# Patient Record
Sex: Male | Born: 1965 | Hispanic: Yes | Marital: Married | Smoking: Former smoker
Health system: Southern US, Community
[De-identification: ages and names within clinical notes are randomized; demographics above are authoritative.]

## PROBLEM LIST (undated history)

## (undated) DIAGNOSIS — G4730 Sleep apnea, unspecified: Secondary | ICD-10-CM

## (undated) DIAGNOSIS — S63104A Unspecified dislocation of right thumb, initial encounter: Secondary | ICD-10-CM

## (undated) DIAGNOSIS — I10 Essential (primary) hypertension: Secondary | ICD-10-CM

## (undated) DIAGNOSIS — R519 Headache, unspecified: Secondary | ICD-10-CM

## (undated) DIAGNOSIS — E1165 Type 2 diabetes mellitus with hyperglycemia: Secondary | ICD-10-CM

## (undated) DIAGNOSIS — F332 Major depressive disorder, recurrent severe without psychotic features: Secondary | ICD-10-CM

## (undated) DIAGNOSIS — F1994 Other psychoactive substance use, unspecified with psychoactive substance-induced mood disorder: Secondary | ICD-10-CM

## (undated) DIAGNOSIS — Z9710 Presence of artificial limb (complete) (partial), unspecified: Secondary | ICD-10-CM

## (undated) DIAGNOSIS — K3184 Gastroparesis: Secondary | ICD-10-CM

## (undated) DIAGNOSIS — K5792 Diverticulitis of intestine, part unspecified, without perforation or abscess without bleeding: Secondary | ICD-10-CM

## (undated) DIAGNOSIS — E118 Type 2 diabetes mellitus with unspecified complications: Secondary | ICD-10-CM

## (undated) DIAGNOSIS — F419 Anxiety disorder, unspecified: Secondary | ICD-10-CM

## (undated) DIAGNOSIS — F32A Depression, unspecified: Secondary | ICD-10-CM

## (undated) DIAGNOSIS — E119 Type 2 diabetes mellitus without complications: Secondary | ICD-10-CM

## (undated) DIAGNOSIS — F339 Major depressive disorder, recurrent, unspecified: Secondary | ICD-10-CM

## (undated) DIAGNOSIS — M542 Cervicalgia: Secondary | ICD-10-CM

## (undated) DIAGNOSIS — M25512 Pain in left shoulder: Secondary | ICD-10-CM

## (undated) DIAGNOSIS — F4310 Post-traumatic stress disorder, unspecified: Secondary | ICD-10-CM

## (undated) DIAGNOSIS — F329 Major depressive disorder, single episode, unspecified: Secondary | ICD-10-CM

## (undated) DIAGNOSIS — M1711 Unilateral primary osteoarthritis, right knee: Secondary | ICD-10-CM

## (undated) DIAGNOSIS — M19012 Primary osteoarthritis, left shoulder: Secondary | ICD-10-CM

## (undated) DIAGNOSIS — G8929 Other chronic pain: Secondary | ICD-10-CM

## (undated) DIAGNOSIS — K219 Gastro-esophageal reflux disease without esophagitis: Secondary | ICD-10-CM

## (undated) DIAGNOSIS — R42 Dizziness and giddiness: Secondary | ICD-10-CM

## (undated) DIAGNOSIS — R45851 Suicidal ideations: Secondary | ICD-10-CM

## (undated) HISTORY — PX: LEG AMPUTATION BELOW KNEE: SHX694

## (undated) HISTORY — DX: Type 2 diabetes mellitus with unspecified complications: E11.8

## (undated) HISTORY — DX: Type 2 diabetes mellitus with hyperglycemia: E11.65

## (undated) HISTORY — DX: Major depressive disorder, recurrent, unspecified: F33.9

## (undated) HISTORY — DX: Primary osteoarthritis, left shoulder: M19.012

## (undated) HISTORY — DX: Post-traumatic stress disorder, unspecified: F43.10

## (undated) HISTORY — PX: BACK SURGERY: SHX140

## (undated) HISTORY — DX: Major depressive disorder, recurrent severe without psychotic features: F33.2

## (undated) HISTORY — DX: Pain in left shoulder: M25.512

## (undated) HISTORY — DX: Suicidal ideations: R45.851

## (undated) HISTORY — DX: Other chronic pain: G89.29

## (undated) HISTORY — DX: Unilateral primary osteoarthritis, right knee: M17.11

## (undated) HISTORY — DX: Gastroparesis: K31.84

## (undated) HISTORY — DX: Cervicalgia: M54.2

## (undated) HISTORY — DX: Essential (primary) hypertension: I10

## (undated) HISTORY — PX: SHOULDER SURGERY: SHX246

## (undated) HISTORY — DX: Unspecified dislocation of right thumb, initial encounter: S63.104A

## (undated) HISTORY — DX: Other psychoactive substance use, unspecified with psychoactive substance-induced mood disorder: F19.94

---

## 2016-01-02 ENCOUNTER — Emergency Department: Payer: Medicare Other

## 2016-01-02 ENCOUNTER — Encounter: Payer: Self-pay | Admitting: Emergency Medicine

## 2016-01-02 ENCOUNTER — Emergency Department
Admission: EM | Admit: 2016-01-02 | Discharge: 2016-01-02 | Disposition: A | Discharge status: Home / Self Care | Payer: Medicare Other | Attending: Emergency Medicine | Admitting: Emergency Medicine

## 2016-01-02 DIAGNOSIS — R1011 Right upper quadrant pain: Secondary | ICD-10-CM | POA: Insufficient documentation

## 2016-01-02 DIAGNOSIS — Z89512 Acquired absence of left leg below knee: Secondary | ICD-10-CM | POA: Insufficient documentation

## 2016-01-02 DIAGNOSIS — R1012 Left upper quadrant pain: Secondary | ICD-10-CM | POA: Insufficient documentation

## 2016-01-02 LAB — COMPREHENSIVE METABOLIC PANEL
ALK PHOS: 47 U/L (ref 38–126)
ALT: 30 U/L (ref 17–63)
ANION GAP: 8 (ref 5–15)
AST: 25 U/L (ref 15–41)
Albumin: 4.9 g/dL (ref 3.5–5.0)
BILIRUBIN TOTAL: 0.8 mg/dL (ref 0.3–1.2)
BUN: 17 mg/dL (ref 6–20)
CALCIUM: 9 mg/dL (ref 8.9–10.3)
CO2: 23 mmol/L (ref 22–32)
Chloride: 108 mmol/L (ref 101–111)
Creatinine, Ser: 1.06 mg/dL (ref 0.61–1.24)
GFR calc Af Amer: >60 mL/min (ref >60)
Glucose, Bld: 107 mg/dL — ABNORMAL HIGH (ref 65–99)
POTASSIUM: 3.8 mmol/L (ref 3.5–5.1)
Sodium: 139 mmol/L (ref 135–145)
TOTAL PROTEIN: 7.4 g/dL (ref 6.5–8.1)

## 2016-01-02 LAB — CBC WITH DIFFERENTIAL/PLATELET
BASOS ABS: 0.1 10*3/uL (ref 0–0.1)
BASOS PCT: 1 %
Eosinophils Absolute: 0.2 10*3/uL (ref 0–0.7)
Eosinophils Relative: 3 %
HCT: 44.1 % (ref 40.0–52.0)
Hemoglobin: 15.3 g/dL (ref 13.0–18.0)
LYMPHS PCT: 30 %
Lymphs Abs: 2.2 10*3/uL (ref 1.0–3.6)
MCH: 28.2 pg (ref 26.0–34.0)
MCHC: 34.7 g/dL (ref 32.0–36.0)
MCV: 81.4 fL (ref 80.0–100.0)
MONO ABS: 0.6 10*3/uL (ref 0.2–1.0)
Monocytes Relative: 8 %
Neutro Abs: 4.2 10*3/uL (ref 1.4–6.5)
Neutrophils Relative %: 58 %
PLATELETS: 234 10*3/uL (ref 150–440)
RBC: 5.42 MIL/uL (ref 4.40–5.90)
RDW: 14.1 % (ref 11.5–14.5)
WBC: 7.2 10*3/uL (ref 3.8–10.6)

## 2016-01-02 LAB — LIPASE, BLOOD: Lipase: 32 U/L (ref 11–51)

## 2016-01-02 MED ORDER — METOCLOPRAMIDE HCL 10 MG PO TABS: 10.0000 mg | ORAL_TABLET | Freq: Three times a day (TID) | ORAL | Status: DC

## 2016-01-02 MED ORDER — FAMOTIDINE 20 MG PO TABS
40.0000 mg | ORAL_TABLET | Freq: Once | ORAL | Status: AC
  Administered 2016-01-02: 40 mg via ORAL

## 2016-01-02 MED ORDER — GI COCKTAIL ~~LOC~~
30.0000 mL | ORAL | Status: AC
  Administered 2016-01-02: 30 mL via ORAL
  Filled 2016-01-02: qty 30

## 2016-01-02 MED ORDER — GI COCKTAIL ~~LOC~~
ORAL | Status: AC
  Administered 2016-01-02: 30 mL via ORAL
  Filled 2016-01-02: qty 30

## 2016-01-02 MED ORDER — RANITIDINE HCL 150 MG PO CAPS: 150.0000 mg | ORAL_CAPSULE | Freq: Two times a day (BID) | ORAL | Status: DC

## 2016-01-02 MED ORDER — SUCRALFATE 1 G PO TABS: 1.0000 g | ORAL_TABLET | Freq: Four times a day (QID) | ORAL | Status: DC

## 2016-01-02 MED ORDER — FAMOTIDINE 20 MG PO TABS
ORAL_TABLET | ORAL | Status: AC
  Administered 2016-01-02: 40 mg via ORAL
  Filled 2016-01-02: qty 2

## 2016-01-02 NOTE — Discharge Instructions (Signed)

## 2016-01-02 NOTE — ED Provider Notes (Signed)
[facility] Emergency Department Provider Note  ____________________________________________  Time seen: 5:00 PM  I have reviewed the triage vital signs and the nursing notes.   HISTORY  Chief Complaint Abdominal Pain    HPI Todd [name] is a 50 y.o. male reports nausea vomiting and bilateral upper abdominal pain after eating for the past month. He reports he has been losing a little bit of weight due to inability to eat regularly. He thinks he may have a hiatal hernia as diagnosed in [place]. He was in a [place] in [place] recently for the same without any specific diagnosis. No hematemesis, no change in bowel habits, no melena or what he stool. No chest pain or shortness of breath. No exertional symptoms. Upper abdominal pain is nonradiating and he has no back pain. No headache dizziness or syncope.  He just moved to this area and has scheduled a new patient appointment with primary care.   History reviewed. No pertinent past medical history.   There are no active problems to display for this patient.    Past Surgical History  Procedure Laterality Date  . Leg amputation below knee      left     Current Outpatient Rx  Name  Route  Sig  Dispense  Refill  . metoCLOPramide (REGLAN) 10 MG tablet   Oral   Take 1 tablet (10 mg total) by mouth 4 (four) times daily -  before meals and at bedtime.   60 tablet   0   . ranitidine (ZANTAC) 150 MG capsule   Oral   Take 1 capsule (150 mg total) by mouth 2 (two) times daily.   28 capsule   0   . sucralfate (CARAFATE) 1 g tablet   Oral   Take 1 tablet (1 g total) by mouth 4 (four) times daily.   120 tablet   1      Allergies Review of patient's allergies indicates no known allergies.   History reviewed. No pertinent family history.  Social History Social History  Substance Use Topics  . Smoking status: Never Smoker   . Smokeless tobacco: None  . Alcohol Use: None    Review of  Systems  Constitutional:   No fever or chills. Slight weight loss over the past month.  Eyes:   No vision changes.  ENT:   No sore throat. No rhinorrhea. Cardiovascular:   No chest pain. Respiratory:   No dyspnea or cough. Gastrointestinal:   Abdominal pain with vomiting. No diarrhea or constipation..  No BRBPR or melena. Genitourinary:   Negative for dysuria or difficulty urinating. Musculoskeletal:   Negative for focal pain or swelling Skin:   Negative for rash. Neurological:   Negative for headaches, focal weakness or numbness.  10-point ROS otherwise negative.  ____________________________________________   PHYSICAL EXAM:  VITAL SIGNS: ED Triage Vitals  Enc Vitals Group     BP 01/02/16 1439 154/77 mmHg     Pulse Rate 01/02/16 1439 76     Resp 01/02/16 1439 18     Temp 01/02/16 1439 98.2 F (36.8 C)     Temp Source 01/02/16 1439 Oral     SpO2 01/02/16 1439 97 %     Weight 01/02/16 1439 260 lb (117.935 kg)     Height 01/02/16 1439 5\' 8"  (1.727 m)     Head Cir --      Peak Flow --      Pain Score 01/02/16 1439 7  Pain Loc --      Pain Edu? --      Excl. in GC? --     Vital signs reviewed, nursing assessments reviewed.   Constitutional:   Alert and oriented. Well appearing and in no distress. Eyes:   No scleral icterus. No conjunctival pallor. PERRL. EOMI ENT   Head:   Normocephalic and atraumatic.   Nose:   No congestion/rhinnorhea. No septal hematoma   Mouth/Throat:   MMMPositive pharyngeal erythema. No peritonsillar mass.    Neck:   No stridor. No SubQ emphysema. No meningismus. Hematological/Lymphatic/Immunilogical:   No cervical lymphadenopathy. Cardiovascular:   RRR. Symmetric bilateral radial and DP pulses.  No murmurs.  Respiratory:   Normal respiratory effort without tachypnea nor retractions. Breath sounds are clear and equal bilaterally. No wheezes/rales/rhonchi. Gastrointestinal:    Soft with mild tenderness in the diffuse bilateral  upper abdomen. Not  distended. There is no CVA tenderness.  No rebound, rigidity, or guarding. Genitourinary:   deferred Musculoskeletal:   Nontender with normal range of motion in all extremities. Status post left AKA Neurologic:   Normal speech and language.  CN 2-10 normal. Motor grossly intact. No gross focal neurologic deficits are appreciated.  Skin:    Skin is warm, dry and intact. No rash noted.  No petechiae, purpura, or bullae. Psychiatric:   Mood and affect are normal. ____________________________________________    LABS (pertinent positives/negatives) (all labs ordered are listed, but only abnormal results are displayed) Labs Reviewed  COMPREHENSIVE METABOLIC PANEL - Abnormal; Notable for the following:    Glucose, Bld 107 (*)    All other components within normal limits  CBC WITH DIFFERENTIAL/PLATELET  LIPASE, BLOOD   ____________________________________________   EKGInterpreted by me  Date: 01/02/2016  Rate: 71  Rhythm: normal sinus rhythm  QRS Axis: normal  Intervals: normal  ST/T Wave abnormalities: normal  Conduction Disutrbances: none  Narrative Interpretation: unremarkable      ____________________________________________    RAD Ultrasound right upper quadrant unremarkable Chest x-ray unremarkable  ____________________________________________   PROCEDURES   ____________________________________________   INITIAL IMPRESSION / ASSESSMENT AND PLAN / ED COURSE  Pertinent labs & imaging results that were available during my care of the patient were reviewed by me and considered in my medical decision making (see chart for details).  Patient well appearing no acute distress. Possible biliary colic. We'll get ultrasound. Go ahead and give GI cocktail and H2 blocker.  ----------------------------------------- 7:28 PM on 01/02/2016 -----------------------------------------  Workup negative. Patient feeling better after emesis. He does take  omeprazole at home as well on a daily basis and encouraged him to continue taking this as well as add on an H2 blocker for now. Follow up with primary care and GI. Vital signs and labs are all unremarkable. Imaging is unremarkable.Considering the patient's symptoms, medical history, and physical examination today, I have low suspicion for ACS, PE, TAD, pneumothorax, carditis, mediastinitis, pneumonia, CHF, or sepsis. I have low suspicion for cholecystitis or biliary pathology, pancreatitis, perforation or bowel obstruction, hernia, intra-abdominal abscess, AAA or dissection, volvulus or intussusception, mesenteric ischemia, or appendicitis.       ____________________________________________   FINAL CLINICAL IMPRESSION(S) / ED DIAGNOSES  Final diagnoses:  Bilateral upper abdominal pain      Sharman Cheek, MD 01/02/16 1929

## 2016-01-02 NOTE — ED Notes (Signed)
Reports abd pain n/v and bloating after eating x 1 month.

## 2016-01-11 ENCOUNTER — Encounter: Payer: Self-pay | Admitting: Emergency Medicine

## 2016-01-11 ENCOUNTER — Emergency Department
Admission: EM | Admit: 2016-01-11 | Discharge: 2016-01-11 | Disposition: A | Discharge status: Home / Self Care | Payer: Medicare Other | Attending: Student | Admitting: Student

## 2016-01-11 DIAGNOSIS — I10 Essential (primary) hypertension: Secondary | ICD-10-CM | POA: Diagnosis not present

## 2016-01-11 DIAGNOSIS — Z79899 Other long term (current) drug therapy: Secondary | ICD-10-CM | POA: Diagnosis not present

## 2016-01-11 DIAGNOSIS — L259 Unspecified contact dermatitis, unspecified cause: Secondary | ICD-10-CM

## 2016-01-11 DIAGNOSIS — E119 Type 2 diabetes mellitus without complications: Secondary | ICD-10-CM | POA: Insufficient documentation

## 2016-01-11 DIAGNOSIS — L237 Allergic contact dermatitis due to plants, except food: Secondary | ICD-10-CM | POA: Diagnosis not present

## 2016-01-11 DIAGNOSIS — R21 Rash and other nonspecific skin eruption: Secondary | ICD-10-CM | POA: Diagnosis present

## 2016-01-11 HISTORY — DX: Gastro-esophageal reflux disease without esophagitis: K21.9

## 2016-01-11 HISTORY — DX: Essential (primary) hypertension: I10

## 2016-01-11 HISTORY — DX: Type 2 diabetes mellitus without complications: E11.9

## 2016-01-11 HISTORY — DX: Depression, unspecified: F32.A

## 2016-01-11 HISTORY — DX: Anxiety disorder, unspecified: F41.9

## 2016-01-11 HISTORY — DX: Sleep apnea, unspecified: G47.30

## 2016-01-11 HISTORY — DX: Major depressive disorder, single episode, unspecified: F32.9

## 2016-01-11 MED ORDER — DEXAMETHASONE SODIUM PHOSPHATE 10 MG/ML IJ SOLN
10.0000 mg | Freq: Once | INTRAMUSCULAR | Status: AC
Start: 2016-01-11 — End: 2016-01-11
  Administered 2016-01-11: 10 mg via INTRAMUSCULAR
  Filled 2016-01-11: qty 1

## 2016-01-11 MED ORDER — PREDNISONE 10 MG PO TABS: ORAL_TABLET | ORAL | Status: DC

## 2016-01-11 NOTE — ED Notes (Signed)
Discussed discharge instructions, prescriptions, and follow-up care with patient. No questions or concerns at this time. Pt stable at discharge.  

## 2016-01-11 NOTE — ED Notes (Signed)
Pt c/o rash and itching that he states is from poison ivy or poison oak. States he has been working outside recently and has had this before. Scattered patchy rash noted to hands, lower abdomen and buttocks. Pt has been using calamine lotion without relief.

## 2016-01-11 NOTE — Discharge Instructions (Signed)
Contact Dermatitis Dermatitis is redness, soreness, and swelling (inflammation) of the skin. Contact dermatitis is a reaction to certain substances that touch the skin. You either touched something that irritated your skin, or you have allergies to something you touched.  HOME CARE  Skin Care  Moisturize your skin as needed.  Apply cool compresses to the affected areas.   Try taking a bath with:   Epsom salts. Follow the instructions on the package. You can get these at a pharmacy or grocery store.   Baking soda. Pour a small amount into the bath as told by your doctor.   Colloidal oatmeal. Follow the instructions on the package. You can get this at a pharmacy or grocery store.   Try applying baking soda paste to your skin. Stir water into baking soda until it looks like paste.  Do not scratch your skin.   Bathe less often.  Bathe in lukewarm water. Avoid using hot water.  Medicines  Take or apply over-the-counter and prescription medicines only as told by your doctor.   If you were prescribed an antibiotic medicine, take or apply your antibiotic as told by your doctor. Do not stop taking the antibiotic even if your condition starts to get better. General Instructions  Keep all follow-up visits as told by your doctor. This is important.   Avoid the substance that caused your reaction. If you do not know what caused it, keep a journal to try to track what caused it. Write down:   What you eat.   What cosmetic products you use.   What you drink.   What you wear in the affected area. This includes jewelry.   If you were given a bandage (dressing), take care of it as told by your doctor. This includes when to change and remove it.  GET HELP IF:   You do not get better with treatment.   Your condition gets worse.   You have signs of infection such as:  Swelling.  Tenderness.  Redness.  Soreness.  Warmth.   You have a fever.   You have new  symptoms.  GET HELP RIGHT AWAY IF:   You have a very bad headache.  You have neck pain.  Your neck is stiff.   You throw up (vomit).   You feel very sleepy.   You see red streaks coming from the affected area.   Your bone or joint underneath the affected area becomes painful after the skin has healed.   The affected area turns darker.   You have trouble breathing.    This information is not intended to replace advice given to you by your health care provider. Make sure you discuss any questions you have with your health care provider.   Document Released: 07/26/2009 Document Revised: 06/19/2015 Document Reviewed: 02/13/2015 Elsevier Interactive Patient Education 2016 Else[employer] Inc[employer]   Continue using calamine lotion as needed. Follow-up with your primary care doctor or [facility] if any continued problems. Begin taking prednisone today as directed.

## 2016-01-11 NOTE — ED Provider Notes (Signed)
[facility] Emergency Department Provider Note  ____________________________________________  Time seen: Approximately 10:48 AM  I have reviewed the triage vital signs and the nursing notes.   HISTORY  Chief Complaint Rash   HPI Todd [name] is a 50 y.o. male is here with complaint of poison oak or poison ivy. 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 He states that he has had a lot of problems with Prozac in the past but did not see anything that contained leads that reminded him of poison oak. He now complains of rash to bilateral hands lower abdomen and bilateral buttocks with the left being worse than the right. He has been using calamine lotion without any relief.   Past Medical History  Diagnosis Date  . Hypertension   . GERD (gastroesophageal reflux disease)   . Anxiety   . Depression   . Diabetes mellitus without complication (HCC)   . Sleep apnea     There are no active problems to display for this patient.   Past Surgical History  Procedure Laterality Date  . Leg amputation below knee      left  . Shoulder surgery      Current Outpatient Rx  Name  Route  Sig  Dispense  Refill  . metoCLOPramide (REGLAN) 10 MG tablet   Oral   Take 1 tablet (10 mg total) by mouth 4 (four) times daily -  before meals and at bedtime.   60 tablet   0   . predniSONE (DELTASONE) 10 MG tablet      Take 6 tablets  today, on day 2 take 5 tablets, day 3 take 4 tablets, day 4 take 3 tablets, day 5 take  2 tablets and 1 tablet the last day   21 tablet   0   . ranitidine (ZANTAC) 150 MG capsule   Oral   Take 1 capsule (150 mg total) by mouth 2 (two) times daily.   28 capsule   0   . sucralfate (CARAFATE) 1 g tablet   Oral   Take 1 tablet (1 g total) by mouth 4 (four) times daily.   120 tablet   1     Allergies Benadryl  No family history on file.  Social History Social History  Substance Use Topics  . Smoking  status: Never Smoker   . Smokeless tobacco: Not on file  . Alcohol Use: No    Review of Systems Constitutional: No fever/chills Respiratory: Denies shortness of breath. Gastrointestinal:  No nausea, no vomiting.  Musculoskeletal: Negative for back pain. Skin: Positive for rash.  10-point ROS otherwise negative.  ____________________________________________   PHYSICAL EXAM:  VITAL SIGNS: ED Triage Vitals  Enc Vitals Group     BP 01/11/16 1046 147/97 mmHg     Pulse Rate 01/11/16 1046 74     Resp 01/11/16 1046 17     Temp 01/11/16 1046 98.4 F (36.9 C)     Temp Source 01/11/16 1046 Oral     SpO2 01/11/16 1046 99 %     Weight 01/11/16 1046 260 lb (117.935 kg)     Height 01/11/16 1046  (1.727 m)     Head Cir --      Peak Flow --      Pain Score 01/11/16 1047 0     Pain Loc --      Pain Edu? --      Excl. in GC? --     Constitutional: Alert and  oriented. Well appearing and in no acute distress. Eyes: Conjunctivae are normal. PERRL. EOMI. Head: Atraumatic. Nose: No congestion/rhinnorhea. Neck: No stridor.   Cardiovascular: Normal rate, regular rhythm. Grossly normal heart sounds.  Good peripheral circulation. Respiratory: Normal respiratory effort.  No retractions. Lungs CTAB. Musculoskeletal: Moves upper and lower extremities without any difficulty. Patient does have a prosthesis on his left lower leg. Neurologic:  Normal speech and language. No gross focal neurologic deficits are appreciated. No gait instability. Skin:  Skin is warm, dry. There is minimal involvement of rash to the hands however patient does have moderate amount of erythematous macular papular areas on the left buttocks, right buttocks and around the waistline. Area is covered with calamine but no vesicles were seen. Psychiatric: Mood and affect are normal. Speech and behavior are normal.  ____________________________________________   LABS (all labs ordered are listed, but only abnormal results  are displayed)  Labs Reviewed - No data to display  PROCEDURES  Procedure(s) performed: None  Critical Care performed: No  ____________________________________________   INITIAL IMPRESSION / ASSESSMENT AND PLAN / ED COURSE  Pertinent labs & imaging results that were available during my care of the patient were reviewed by me and considered in my medical decision making (see chart for details).  Patient is allergic to Benadryl and cannot take this for itching. He will continue calamine lotion to the areas. He was given an injection of Decadron IM and prescribed a prednisone 60 mg 6 day taper. ____________________________________________   FINAL CLINICAL IMPRESSION(S) / ED DIAGNOSES  Final diagnoses:  Contact dermatitis      Tommi RumpsRhonda L Delle Andrzejewski, PA-C 01/11/16 1354  Gayla DossEryka A Gayle, MD 01/11/16 1555

## 2016-01-31 DIAGNOSIS — M1711 Unilateral primary osteoarthritis, right knee: Secondary | ICD-10-CM

## 2016-01-31 HISTORY — DX: Unilateral primary osteoarthritis, right knee: M17.11

## 2016-02-15 DIAGNOSIS — M542 Cervicalgia: Secondary | ICD-10-CM

## 2016-02-15 DIAGNOSIS — M25512 Pain in left shoulder: Secondary | ICD-10-CM

## 2016-02-15 DIAGNOSIS — G8929 Other chronic pain: Secondary | ICD-10-CM

## 2016-02-15 HISTORY — DX: Other chronic pain: G89.29

## 2016-02-15 HISTORY — DX: Cervicalgia: M54.2

## 2016-02-28 ENCOUNTER — Emergency Department: Payer: Medicare Other

## 2016-02-28 ENCOUNTER — Emergency Department
Admission: EM | Admit: 2016-02-28 | Discharge: 2016-02-28 | Disposition: A | Discharge status: Home / Self Care | Payer: Medicare Other | Attending: Emergency Medicine | Admitting: Emergency Medicine

## 2016-02-28 ENCOUNTER — Ambulatory Visit: Payer: Medicare Other | Admitting: Psychiatry

## 2016-02-28 ENCOUNTER — Encounter: Payer: Self-pay | Admitting: Emergency Medicine

## 2016-02-28 DIAGNOSIS — I10 Essential (primary) hypertension: Secondary | ICD-10-CM | POA: Insufficient documentation

## 2016-02-28 DIAGNOSIS — Z89512 Acquired absence of left leg below knee: Secondary | ICD-10-CM | POA: Diagnosis not present

## 2016-02-28 DIAGNOSIS — E119 Type 2 diabetes mellitus without complications: Secondary | ICD-10-CM | POA: Diagnosis not present

## 2016-02-28 DIAGNOSIS — R600 Localized edema: Secondary | ICD-10-CM | POA: Diagnosis not present

## 2016-02-28 DIAGNOSIS — F329 Major depressive disorder, single episode, unspecified: Secondary | ICD-10-CM | POA: Insufficient documentation

## 2016-02-28 DIAGNOSIS — R609 Edema, unspecified: Secondary | ICD-10-CM

## 2016-02-28 DIAGNOSIS — H5711 Ocular pain, right eye: Secondary | ICD-10-CM | POA: Insufficient documentation

## 2016-02-28 DIAGNOSIS — R51 Headache: Secondary | ICD-10-CM | POA: Diagnosis not present

## 2016-02-28 DIAGNOSIS — M79604 Pain in right leg: Secondary | ICD-10-CM | POA: Diagnosis present

## 2016-02-28 DIAGNOSIS — R519 Headache, unspecified: Secondary | ICD-10-CM

## 2016-02-28 LAB — CBC
HCT: 45.2 % (ref 40.0–52.0)
Hemoglobin: 15.3 g/dL (ref 13.0–18.0)
MCH: 28 pg (ref 26.0–34.0)
MCHC: 33.8 g/dL (ref 32.0–36.0)
MCV: 82.7 fL (ref 80.0–100.0)
PLATELETS: 194 10*3/uL (ref 150–440)
RBC: 5.46 MIL/uL (ref 4.40–5.90)
RDW: 13.8 % (ref 11.5–14.5)
WBC: 5.8 10*3/uL (ref 3.8–10.6)

## 2016-02-28 LAB — COMPREHENSIVE METABOLIC PANEL
ALT: 34 U/L (ref 17–63)
ANION GAP: 6 (ref 5–15)
AST: 26 U/L (ref 15–41)
Albumin: 4.6 g/dL (ref 3.5–5.0)
Alkaline Phosphatase: 45 U/L (ref 38–126)
BUN: 15 mg/dL (ref 6–20)
CALCIUM: 9.2 mg/dL (ref 8.9–10.3)
CO2: 26 mmol/L (ref 22–32)
Chloride: 109 mmol/L (ref 101–111)
Creatinine, Ser: 0.96 mg/dL (ref 0.61–1.24)
GFR calc Af Amer: >60 mL/min (ref >60)
GLUCOSE: 92 mg/dL (ref 65–99)
Potassium: 4.1 mmol/L (ref 3.5–5.1)
SODIUM: 141 mmol/L (ref 135–145)
TOTAL PROTEIN: 7.2 g/dL (ref 6.5–8.1)
Total Bilirubin: 0.9 mg/dL (ref 0.3–1.2)

## 2016-02-28 MED ORDER — BUTALBITAL-APAP-CAFFEINE 50-325-40 MG PO TABS: 1.0000 | ORAL_TABLET | Freq: Four times a day (QID) | ORAL | Status: AC | PRN

## 2016-02-28 MED ORDER — TETRACAINE HCL 0.5 % OP SOLN
2.0000 [drp] | Freq: Once | OPHTHALMIC | Status: AC
  Administered 2016-02-28: 2 [drp] via OPHTHALMIC
  Filled 2016-02-28: qty 2

## 2016-02-28 NOTE — Discharge Instructions (Signed)
Peripheral Edema You have swelling in your legs (peripheral edema). This swelling is due to excess accumulation of salt and water in your body. Edema may be a sign of heart, kidney or liver disease, or a side effect of a medication. It may also be due to problems in the leg veins. Elevating your legs and using special support stockings may be very helpful, if the cause of the swelling is due to poor venous circulation. Avoid long periods of standing, whatever the cause. Treatment of edema depends on identifying the cause. Chips, pretzels, pickles and other salty foods should be avoided. Restricting salt in your diet is almost always needed. Water pills (diuretics) are often used to remove the excess salt and water from your body via urine. These medicines prevent the kidney from reabsorbing sodium. This increases urine flow. Diuretic treatment may also result in lowering of potassium levels in your body. Potassium supplements may be needed if you have to use diuretics daily. Daily weights can help you keep track of your progress in clearing your edema. You should call your caregiver for follow up care as recommended. SEEK IMMEDIATE MEDICAL CARE IF:   You have increased swelling, pain, redness, or heat in your legs.  You develop shortness of breath, especially when lying down.  You develop chest or abdominal pain, weakness, or fainting.  You have a fever.   This information is not intended to replace advice given to you by your health care provider. Make sure you discuss any questions you have with your health care provider.   Document Released: 11/05/2004 Document Revised: 12/21/2011 Document Reviewed: 04/10/2015 Elsevier Interactive Patient Education 2016 Elsevier Inc[employer]  General Headache Without Cause A headache is pain or discomfort felt around the head or neck area. The specific cause of a headache may not be found. There are many causes and types of headaches. A few common ones  are:  Tension headaches.  Migraine headaches.  Cluster headaches.  Chronic daily headaches. HOME CARE INSTRUCTIONS  Watch your condition for any changes. Take these steps to help with your condition: Managing Pain  Take over-the-counter and prescription medicines only as told by your health care provider.  Lie down in a dark, quiet room when you have a headache.  If directed, apply ice to the head and neck area:  Put ice in a plastic bag.  Place a towel between your skin and the bag.  Leave the ice on for 20 minutes, 2-3 times per day.  Use a heating pad or hot shower to apply heat to the head and neck area as told by your health care provider.  Keep lights dim if bright lights bother you or make your headaches worse. Eating and Drinking  Eat meals on a regular schedule.  Limit alcohol use.  Decrease the amount of caffeine you drink, or stop drinking caffeine. General Instructions  Keep all follow-up visits as told by your health care provider. This is important.  Keep a headache journal to help find out what may trigger your headaches. For example, write down:  What you eat and drink.  How much sleep you get.  Any change to your diet or medicines.  Try massage or other relaxation techniques.  Limit stress.  Sit up straight, and do not tense your muscles.  Do not use tobacco products, including cigarettes, chewing tobacco, or e-cigarettes. If you need help quitting, ask your health care provider.  Exercise regularly as told by your health care provider.  Sleep on  a regular schedule. Get 7-9 hours of sleep, or the amount recommended by your health care provider. SEEK MEDICAL CARE IF:   Your symptoms are not helped by medicine.  You have a headache that is different from the usual headache.  You have nausea or you vomit.  You have a fever. SEEK IMMEDIATE MEDICAL CARE IF:   Your headache becomes severe.  You have repeated vomiting.  You have a  stiff neck.  You have a loss of vision.  You have problems with speech.  You have pain in the eye or ear.  You have muscular weakness or loss of muscle control.  You lose your balance or have trouble walking.  You feel faint or pass out.  You have confusion.   This information is not intended to replace advice given to you by your health care provider. Make sure you discuss any questions you have with your health care provider.   Document Released: 09/28/2005 Document Revised: 06/19/2015 Document Reviewed: 01/21/2015 Elsevier Interactive Patient Education [employer]

## 2016-02-28 NOTE — ED Provider Notes (Signed)
[facility] Emergency Department Provider Note  Time seen: 2:26 PM  I have reviewed the triage vital signs and the nursing notes.   HISTORY  Chief Complaint Leg Pain and Eye Pain    HPI Todd [name] is a 50 y.o. male with a past medical history of anxiety, gastric reflux, hypertension, diabetes presents the emergency department with right eye pain and right leg swelling. According to the patient since this morning he has been having pain in his right eye, yesterday he thought the right eye looked a little red. He also states a moderate headache behind the right eye today. States he does get headaches fairly typically but they did not often occur with eye pain. He feels like he is intermittently having blurry vision of the right eye but denies any currently. Patient also states today he awoke with right lower extremity swelling. States it is somewhat normal for him to have lower extremity swelling but not to this degree. He states the swelling has gone down and is now back to his normal level. He states he has been wearing a compression stocking today for the swelling. The patient does not take Lasix currently. Patient does state mild pain in the right lower extremity as well. Somewhat worse with walking. Patient denies any trauma.     Past Medical History  Diagnosis Date  . Hypertension   . GERD (gastroesophageal reflux disease)   . Anxiety   . Depression   . Diabetes mellitus without complication (HCC)   . Sleep apnea     There are no active problems to display for this patient.   Past Surgical History  Procedure Laterality Date  . Leg amputation below knee      left  . Shoulder surgery      Current Outpatient Rx  Name  Route  Sig  Dispense  Refill  . metoCLOPramide (REGLAN) 10 MG tablet   Oral   Take 1 tablet (10 mg total) by mouth 4 (four) times daily -  before meals and at bedtime.   60 tablet   0   . predniSONE (DELTASONE) 10 MG tablet     Take 6 tablets  today, on day 2 take 5 tablets, day 3 take 4 tablets, day 4 take 3 tablets, day 5 take  2 tablets and 1 tablet the last day   21 tablet   0   . ranitidine (ZANTAC) 150 MG capsule   Oral   Take 1 capsule (150 mg total) by mouth 2 (two) times daily.   28 capsule   0   . sucralfate (CARAFATE) 1 g tablet   Oral   Take 1 tablet (1 g total) by mouth 4 (four) times daily.   120 tablet   1     Allergies Benadryl  History reviewed. No pertinent family history.  Social History Social History  Substance Use Topics  . Smoking status: Never Smoker   . Smokeless tobacco: None  . Alcohol Use: No    Review of Systems Constitutional: Negative for fever. Eyes: Intermittent blurry vision. Pain behind right eye. Redness in the right eye which has resolved. ENT: Negative for congestion Cardiovascular: Negative for chest pain. Respiratory: Negative for shortness of breath. Gastrointestinal: Negative for abdominal pain Musculoskeletal: Right lower extremity swelling and discomfort. Skin: Negative for rash 10-point ROS otherwise negative.  ____________________________________________   PHYSICAL EXAM:  VITAL SIGNS: ED Triage Vitals  Enc Vitals Group     BP 02/28/16 1215 167/81 mmHg  Pulse Rate 02/28/16 1215 64     Resp 02/28/16 1215 18     Temp 02/28/16 1215 98.2 F (36.8 C)     Temp src --      SpO2 02/28/16 1215 98 %     Weight 02/28/16 1215 265 lb (120.203 kg)     Height 02/28/16 1215 5\' 8"  (1.727 m)     Head Cir --      Peak Flow --      Pain Score 02/28/16 1225 7     Pain Loc --      Pain Edu? --      Excl. in GC? --     Constitutional: Alert and oriented. Well appearing and in no distress. Eyes: Normal exam, 2-3 mm PERRL bilaterally. EOMI. No conjunctival injection. ENT   Head: Normocephalic and atraumatic   Mouth/Throat: Mucous membranes are moist. Cardiovascular: Normal rate, regular rhythm. No murmur Respiratory: Normal respiratory  effort without tachypnea nor retractions. Breath sounds are clear and equal bilaterally. No wheezes/rales/rhonchi. Gastrointestinal: Soft and nontender. No distention Musculoskeletal: The patient has a prosthetic left lower extremity. Minimal right lower extremity swelling/edema, no calf tenderness. 2+ DP pulse. Sensation intact. Neurologic:  Normal speech and language. No gross focal neurologic deficits  Skin:  Skin is warm, dry and intact.  Psychiatric: Mood and affect are normal.   ____________________________________________   RADIOLOGY  Ultrasound negative.  ____________________________________________    INITIAL IMPRESSION / ASSESSMENT AND PLAN / ED COURSE  Pertinent labs & imaging results that were available during my care of the patient were reviewed by me and considered in my medical decision making (see chart for details).  The patient presents to the emergency department with right lower extremity pain and swelling worse this morning which she states is now improved significantly over the day after wearing his compression stocking. Patient has minimal edema currently. No calf tenderness, 2+ DP pulse. We'll obtain an ultrasound to rule out DVT as the patient states earlier today he was more significantly swollen. Patient has good range of motion in joints, no trauma, do not suspect fracture. Patient also is complaining of right eye pain/pressure which she describes as moderate with a moderate headache, worse when looking at light. Patient has a normal-appearing eye, normal and reactive pupil, normal conjunctiva, EOMI. We will apply tetracaine and measured tonometry pressures in the right eye to ensure no acute angle closure glaucoma. Overall the patient appears very well, no distress.  Tonometry pressures are normal. 12, 12 in the right, 14, 17 in the left. Currently awaiting ultrasound results. Patient continues to appear very well, watching TV.  Ultrasound negative for DVT.  Labs within normal limits. Patient continues to appear well and we will discharge home with a short course of Fioricet as needed for his headache. I discussed with the patient drinking plenty of fluids and obtaining plenty of rest patient will follow up with his primary care physician.  ____________________________________________   FINAL CLINICAL IMPRESSION(S) / ED DIAGNOSES  Right eye pain Headache Right lower extremity pain swelling   Minna AntisKevin Faren Florence, MD 02/28/16 1557

## 2016-02-28 NOTE — ED Notes (Signed)
Pt to ed with c/o right eye redness and pain and headache that started yesterday, reports right foot swelling and pain that started today.  Pt reports increased pain with walking.

## 2016-05-18 ENCOUNTER — Encounter: Payer: Self-pay | Admitting: Emergency Medicine

## 2016-05-18 ENCOUNTER — Emergency Department: Payer: Medicare Other

## 2016-05-18 ENCOUNTER — Emergency Department
Admission: EM | Admit: 2016-05-18 | Discharge: 2016-05-19 | Disposition: A | Discharge status: Other Acute Inpatient Hospital | Payer: Medicare Other | Attending: Emergency Medicine | Admitting: Emergency Medicine

## 2016-05-18 DIAGNOSIS — I10 Essential (primary) hypertension: Secondary | ICD-10-CM | POA: Insufficient documentation

## 2016-05-18 DIAGNOSIS — Z79899 Other long term (current) drug therapy: Secondary | ICD-10-CM | POA: Insufficient documentation

## 2016-05-18 DIAGNOSIS — R258 Other abnormal involuntary movements: Secondary | ICD-10-CM | POA: Insufficient documentation

## 2016-05-18 DIAGNOSIS — R451 Restlessness and agitation: Secondary | ICD-10-CM | POA: Insufficient documentation

## 2016-05-18 DIAGNOSIS — E119 Type 2 diabetes mellitus without complications: Secondary | ICD-10-CM | POA: Insufficient documentation

## 2016-05-18 DIAGNOSIS — R45851 Suicidal ideations: Secondary | ICD-10-CM

## 2016-05-18 DIAGNOSIS — F919 Conduct disorder, unspecified: Secondary | ICD-10-CM | POA: Insufficient documentation

## 2016-05-18 DIAGNOSIS — F1994 Other psychoactive substance use, unspecified with psychoactive substance-induced mood disorder: Secondary | ICD-10-CM

## 2016-05-18 DIAGNOSIS — F332 Major depressive disorder, recurrent severe without psychotic features: Secondary | ICD-10-CM

## 2016-05-18 LAB — ACETAMINOPHEN LEVEL: Acetaminophen (Tylenol), Serum: <10 ug/mL — ABNORMAL LOW (ref 10–30)

## 2016-05-18 LAB — CBC
HEMATOCRIT: 47.5 % (ref 40.0–52.0)
Hemoglobin: 16.1 g/dL (ref 13.0–18.0)
MCH: 28.1 pg (ref 26.0–34.0)
MCHC: 34 g/dL (ref 32.0–36.0)
MCV: 82.6 fL (ref 80.0–100.0)
Platelets: 232 10*3/uL (ref 150–440)
RBC: 5.75 MIL/uL (ref 4.40–5.90)
RDW: 13.5 % (ref 11.5–14.5)
WBC: 8.1 10*3/uL (ref 3.8–10.6)

## 2016-05-18 LAB — URINE DRUG SCREEN, QUALITATIVE (ARMC ONLY)
AMPHETAMINES, UR SCREEN: NOT DETECTED
Barbiturates, Ur Screen: NOT DETECTED
Benzodiazepine, Ur Scrn: NOT DETECTED
CANNABINOID 50 NG, UR ~~LOC~~: NOT DETECTED
COCAINE METABOLITE, UR ~~LOC~~: NOT DETECTED
MDMA (ECSTASY) UR SCREEN: NOT DETECTED
Methadone Scn, Ur: NOT DETECTED
OPIATE, UR SCREEN: NOT DETECTED
PHENCYCLIDINE (PCP) UR S: NOT DETECTED
Tricyclic, Ur Screen: NOT DETECTED

## 2016-05-18 LAB — COMPREHENSIVE METABOLIC PANEL
ALBUMIN: 4.8 g/dL (ref 3.5–5.0)
ALT: 42 U/L (ref 17–63)
AST: 32 U/L (ref 15–41)
Alkaline Phosphatase: 43 U/L (ref 38–126)
Anion gap: 13 (ref 5–15)
BILIRUBIN TOTAL: 0.8 mg/dL (ref 0.3–1.2)
BUN: 13 mg/dL (ref 6–20)
CHLORIDE: 103 mmol/L (ref 101–111)
CO2: 20 mmol/L — ABNORMAL LOW (ref 22–32)
CREATININE: 0.93 mg/dL (ref 0.61–1.24)
Calcium: 9.2 mg/dL (ref 8.9–10.3)
GFR calc Af Amer: >60 mL/min (ref >60)
GLUCOSE: 104 mg/dL — AB (ref 65–99)
POTASSIUM: 3 mmol/L — AB (ref 3.5–5.1)
Sodium: 136 mmol/L (ref 135–145)
TOTAL PROTEIN: 7.6 g/dL (ref 6.5–8.1)

## 2016-05-18 LAB — SALICYLATE LEVEL: Salicylate Lvl: <4 mg/dL (ref 2.8–30.0)

## 2016-05-18 LAB — ETHANOL: Alcohol, Ethyl (B): 179 mg/dL — ABNORMAL HIGH (ref <5)

## 2016-05-18 MED ORDER — CLONAZEPAM 0.5 MG PO TABS
ORAL_TABLET | ORAL | Status: AC
  Administered 2016-05-18: 1 mg
  Filled 2016-05-18: qty 2

## 2016-05-18 MED ORDER — LORAZEPAM 2 MG/ML IJ SOLN
INTRAMUSCULAR | Status: AC
  Administered 2016-05-18: 2 mg via INTRAMUSCULAR
  Filled 2016-05-18: qty 1

## 2016-05-18 MED ORDER — QUETIAPINE FUMARATE 200 MG PO TABS
ORAL_TABLET | ORAL | Status: AC
  Administered 2016-05-18: 100 mg
  Filled 2016-05-18: qty 1

## 2016-05-18 MED ORDER — LORAZEPAM 2 MG/ML IJ SOLN
2.0000 mg | Freq: Once | INTRAMUSCULAR | Status: AC
  Administered 2016-05-18: 2 mg via INTRAMUSCULAR
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
## 2016-05-18 NOTE — ED Notes (Signed)
Pt states he is feeling anxious and states when he feels this way he can excalate and become aggressive. Pt is calm and this time and states "I do not want hurt anyone". Meds given per md order, xray done at bedside.

## 2016-05-18 NOTE — ED Notes (Signed)
Pt punching the wall, states "I want to off myself". Once spoke with patient he was able to calm down and agrees not to punch the wall or do any self harming behaviors. Pt denies any HI at this time, no co pain or discomfort. Pt awaiting to see TTS and psychiatry.

## 2016-05-18 NOTE — ED Notes (Signed)
Patient observed with no unusual behavior or acute distress. Patient with no verbalized needs or c/o at this time.... will continue to monitor and follow up as needed. Security staff monitoring patient.  

## 2016-05-18 NOTE — ED Notes (Signed)

## 2016-05-18 NOTE — ED Notes (Signed)
Pt moved to 24 for comfort, pt resting comfortably at this time with eyes closed.

## 2016-05-18 NOTE — ED Notes (Signed)
Report received from Kim RN. Patient care assumed. Patient/RN introduction complete. Will continue to monitor.  

## 2016-05-18 NOTE — ED Triage Notes (Signed)
Pt brought in by [facility] via ems with thoughts of suicide. Pt states his meds are not working and he has been depressed.
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
## 2016-05-18 NOTE — ED Notes (Signed)
BEHAVIORAL HEALTH ROUNDING Patient sleeping: No. Patient alert and oriented: yes Behavior appropriate: Yes.  ; If no, describe:  Nutrition and fluids offered: Yes  Toileting and hygiene offered: Yes  Sitter present: not applicable Law enforcement present: Yes  

## 2016-05-18 NOTE — ED Notes (Signed)
TTS in to speak with pt.  

## 2016-05-18 NOTE — ED Notes (Signed)
Patient has L leg prothesis.

## 2016-05-19 ENCOUNTER — Inpatient Hospital Stay
Admission: EM | Admit: 2016-05-19 | Discharge: 2016-05-20 | DRG: 885 | Disposition: A | Discharge status: Home / Self Care | Payer: Medicare Other | Source: Intra-hospital | Attending: Psychiatry | Admitting: Psychiatry

## 2016-05-19 DIAGNOSIS — R45851 Suicidal ideations: Secondary | ICD-10-CM | POA: Diagnosis present

## 2016-05-19 DIAGNOSIS — Z79899 Other long term (current) drug therapy: Secondary | ICD-10-CM

## 2016-05-19 DIAGNOSIS — I10 Essential (primary) hypertension: Secondary | ICD-10-CM | POA: Diagnosis present

## 2016-05-19 DIAGNOSIS — F332 Major depressive disorder, recurrent severe without psychotic features: Secondary | ICD-10-CM | POA: Diagnosis present

## 2016-05-19 DIAGNOSIS — F1010 Alcohol abuse, uncomplicated: Secondary | ICD-10-CM | POA: Diagnosis present

## 2016-05-19 DIAGNOSIS — F1994 Other psychoactive substance use, unspecified with psychoactive substance-induced mood disorder: Secondary | ICD-10-CM | POA: Diagnosis not present

## 2016-05-19 DIAGNOSIS — K219 Gastro-esophageal reflux disease without esophagitis: Secondary | ICD-10-CM | POA: Diagnosis present

## 2016-05-19 DIAGNOSIS — Z89519 Acquired absence of unspecified leg below knee: Secondary | ICD-10-CM

## 2016-05-19 DIAGNOSIS — Z818 Family history of other mental and behavioral disorders: Secondary | ICD-10-CM

## 2016-05-19 HISTORY — DX: Suicidal ideations: R45.851

## 2016-05-19 HISTORY — DX: Other psychoactive substance use, unspecified with psychoactive substance-induced mood disorder: F19.94

## 2016-05-19 HISTORY — DX: Major depressive disorder, recurrent severe without psychotic features: F33.2

## 2016-05-19 HISTORY — DX: Essential (primary) hypertension: I10

## 2016-05-19 MED ORDER — ZOLPIDEM TARTRATE 5 MG PO TABS
10.0000 mg | ORAL_TABLET | Freq: Every evening | ORAL | Status: DC | PRN
  Administered 2016-05-19: 10 mg via ORAL
  Filled 2016-05-19: qty 2

## 2016-05-19 MED ORDER — ZOLPIDEM TARTRATE 5 MG PO TABS: 10.0000 mg | ORAL_TABLET | Freq: Every evening | ORAL | Status: DC | PRN

## 2016-05-19 MED ORDER — CLONAZEPAM 0.5 MG PO TABS
0.5000 mg | ORAL_TABLET | Freq: Two times a day (BID) | ORAL | Status: DC
  Administered 2016-05-19 – 2016-05-20 (×2): 0.5 mg via ORAL
  Filled 2016-05-19 (×2): qty 1

## 2016-05-19 MED ORDER — LISINOPRIL 20 MG PO TABS
40.0000 mg | ORAL_TABLET | Freq: Every day | ORAL | Status: DC
  Administered 2016-05-19 – 2016-05-20 (×2): 40 mg via ORAL
  Filled 2016-05-19 (×2): qty 2

## 2016-05-19 MED ORDER — CLONAZEPAM 0.5 MG PO TABS
0.5000 mg | ORAL_TABLET | Freq: Two times a day (BID) | ORAL | Status: DC
Start: 2016-05-19 — End: 2016-05-19
  Administered 2016-05-19: 0.5 mg via ORAL
  Filled 2016-05-19: qty 1

## 2016-05-19 MED ORDER — ACETAMINOPHEN 325 MG PO TABS: 650.0000 mg | ORAL_TABLET | Freq: Four times a day (QID) | ORAL | Status: DC | PRN

## 2016-05-19 MED ORDER — PANTOPRAZOLE SODIUM 40 MG PO TBEC
40.0000 mg | DELAYED_RELEASE_TABLET | Freq: Every day | ORAL | Status: DC
  Administered 2016-05-20: 40 mg via ORAL
  Filled 2016-05-19: qty 1

## 2016-05-19 MED ORDER — MAGNESIUM HYDROXIDE 400 MG/5ML PO SUSP: 30.0000 mL | Freq: Every day | ORAL | Status: DC | PRN

## 2016-05-19 MED ORDER — ALUM & MAG HYDROXIDE-SIMETH 200-200-20 MG/5ML PO SUSP: 30.0000 mL | ORAL | Status: DC | PRN

## 2016-05-19 NOTE — ED Notes (Signed)
Patient observed with no unusual behavior or acute distress. Patient with no verbalized needs or c/o at this time.... will continue to monitor and follow up as needed. Security staff monitoring patient.  

## 2016-05-19 NOTE — Tx Team (Signed)
Initial Interdisciplinary Treatment Plan   PATIENT STRESSORS: Marital or family conflict Traumatic event Lack of sleep   PATIENT STRENGTHS: Ability for insight Average or above average intelligence Capable of independent living Supportive family/friends   PROBLEM LIST: Problem List/Patient Goals Date to be addressed Date deferred Reason deferred Estimated date of resolution  Anxiety      Depression      Hypertension                                           DISCHARGE CRITERIA:  Improved stabilization in mood, thinking, and/or behavior Medical problems require only outpatient monitoring Motivation to continue treatment in a less acute level of care  PRELIMINARY DISCHARGE PLAN: Attend aftercare/continuing care group Outpatient therapy  PATIENT/FAMIILY INVOLVEMENT: This treatment plan has been presented to and reviewed with the patient, Todd [name].  The patient and family have been given the opportunity to ask questions and make suggestions.  Dianna Ewald W Soha Thorup 05/19/2016, 5:17 PM
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
## 2016-05-19 NOTE — ED Provider Notes (Signed)
-----------------------------------------   6:49 AM on 05/19/2016 -----------------------------------------   Blood pressure 118/81, pulse 89, temperature 97.9 F (36.6 C), resp. rate 20, SpO2 95 %.  The patient had no acute events since last update.  Calm and cooperative at this time.  Disposition is pending per Psychiatry/Behavioral Medicine team recommendations.     Irean HongJade J Stacy Deshler, MD 05/19/16 [phone]

## 2016-05-19 NOTE — Plan of Care (Signed)
Problem: Safety: Goal: Periods of time without injury will increase Outcome: Progressing Pt remains free from harm.   Problem: Coping: Goal: Ability to verbalize feelings will improve Outcome: Progressing Pt verbalizes feelings appropriately. He discusses with writer what brought him to the hospital. Pt rates depression 0/10 at this time.
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
## 2016-05-19 NOTE — ED Notes (Signed)
Patient up to bathroom at this time.

## 2016-05-19 NOTE — Progress Notes (Signed)
[facility] to [facility] by Dr. Toni Amendlapacs.  Attending Physician will be Dr. Jennet MaduroPucilowska.   Patient has been [facility] to room 307, [facility] Charge Nurse WestonPhyllis.   Intake Paper Work has been signed and placed on patient chart.  ER staff is aware of the admission Misty Stanley( [facility].;  ER MD; Annette StableBill Patient's Nurse & Misty StanleyL[facility] Patient Access).   05/19/2016 Cheryl FlashNicole Kriston Pasquarello, MS, NCC, LPCA Therapeutic Triage Specialist

## 2016-05-20 DIAGNOSIS — F332 Major depressive disorder, recurrent severe without psychotic features: Principal | ICD-10-CM

## 2016-05-20 MED ORDER — LISINOPRIL 40 MG PO TABS: 40.0000 mg | ORAL_TABLET | Freq: Every day | ORAL | 0 refills | Status: AC

## 2016-05-20 MED ORDER — ZOLPIDEM TARTRATE 10 MG PO TABS: 10.0000 mg | ORAL_TABLET | Freq: Every evening | ORAL | 0 refills | Status: DC | PRN

## 2016-05-20 NOTE — Progress Notes (Signed)
D: Pt is seen interacting with a select group of male peers this evening. He is pleasant and cooperative upon approach. Pt discusses with Clinical research associatewriter his stressors, such as his wife's hand surgery and [place]. Pt currently rates depression 0/10. He denies SI/HI/AVH at this time. Pt states "I'm doing well. I don't drink alcohol usually and I did yesterday which was stupid because it's a depressant." A: Emotional support and encouragement provided. Medications administered with education. q15 minute safety checks maintained. R: Pt remains free from harm. Will continue to monitor.

## 2016-05-20 NOTE — [facility] Suicide Risk Assessment (Signed)
[facility]   Principal Problem: Severe recurrent major depression without psychotic features [facility](HCC) Discharge Diagnoses:  Patient Active Problem List   Diagnosis Date Noted  . Severe recurrent major depression without psychotic features ([facility]) [F33.2] 05/19/2016    Priority: High  . Substance induced mood disorder ([facility]) [F19.94] 05/19/2016  . Suicidal ideation [R45.851] 05/19/2016  . HTN (hypertension) [I10] 05/19/2016  . GERD (gastroesophageal reflux disease) [K21.9] 05/19/2016  . Alcohol use disorder, mild, abuse [F10.10] 05/19/2016    Total Time spent with patient: 1 hour  Musculoskeletal: Strength & Muscle Tone: within normal limits Gait & Station: normal Patient leans: Right and N/A  Psychiatric Specialty Exam: Review of Systems  All other systems reviewed and are negative.   Blood pressure 127/82, pulse 73, temperature 97.8 F (36.6 C), temperature source Oral, resp. rate 18, height 5\' 8"  (1.727 m), weight 112.5 kg (248 lb), SpO2 99 %.Body mass index is 37.71 kg/m.  See SRA.                                                     Mental Status Per Nursing Assessment::   On Admission:     Demographic Factors:  Male  Loss Factors: NA  Historical Factors: Prior suicide attempts and Impulsivity  Risk Reduction Factors:   Sense of responsibility to family, Living with another person, especially a relative, Positive social support and Positive therapeutic relationship  Continued Clinical Symptoms:  Depression:   Comorbid alcohol abuse/dependence Impulsivity Alcohol/Substance Abuse/Dependencies  Cognitive Features That Contribute To Risk:  None    Suicide Risk:  Minimal: No identifiable suicidal ideation.  Patients presenting with no risk factors but with morbid ruminations; may be classified as minimal risk based on the severity of the depressive symptoms    Plan Of Care/Follow-up recommendations:  Activity:   As tolerated. Diet:  Low sodium heart healthy. Other:  Keep follow-up appointments.  Kristine LineaJolanta Emelynn Rance, MD 05/20/2016, 9:17 AM

## 2016-05-20 NOTE — [facility] Suicide Risk Assessment (Signed)
[facility] INPATIENT:  Family/Significant Other Suicide Prevention Education  Suicide Prevention Education:  Patient Refusal for Family/Significant Other Suicide Prevention Education: The patient Todd [name] has refused to provide written consent for family/significant other to be provided Family/Significant Other Suicide Prevention Education during admission and/or prior to discharge.  Physician notified. Pt refused SPE citing "I know the warning signs".  Dorothe PeaJonathan F Jolanda Mccann 05/20/2016, 12:06 PM
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
## 2016-05-20 NOTE — Progress Notes (Signed)
  [facility] :  Will you be returning to the same living situation after discharge:  Yes,  pt will be returning to [place] in [place], [place] At discharge, do you have transportation home?: Yes,  pt will be picked up by his wife Do you have the ability to pay for your medications: Yes,  pt will be provided with prescriptions at discharge  Release of information consent forms completed and in the chart;  Patient's signature needed at discharge.  Patient to Follow up at: Follow-up Information    [facility] .   Why:  Please arrive on Thursday August 10th from 8-9am to the walk-in clinic to see Theresa MulliganKathleen Sisk for your hospital follow up for medication management and therapy. Contact information: [facility] [address] [place]erv[place], [place], [place] [address] Ph: [phone] Fax: [phone]           Next level of care provider has access to [facility] Link:no  Safety Planning and Suicide Prevention discussed: No. Pt refused  Have you used any form of tobacco in the last 30 days? (Cigarettes, Smokeless Tobacco, Cigars, and/or Pipes): No  Has patient been referred to the Quitline?: N/A patient is not a smoker  Patient has been referred for addiction treatment: Pt refused  Mercy RidingJonathan F Janira Mandell 05/20/2016, 3:13 PM

## 2016-05-20 NOTE — BHH Group Notes (Signed)
B[facility] LCSW Group Therapy  05/20/2016 12:00 PM  Type of Therapy:  Group Therapy  Participation Level:  Active  Participation Quality:  Appropriate, Sharing and Supportive  Affect:  Appropriate  Cognitive:  Alert and Appropriate  Insight:  Developing/Improving and Engaged  Engagement in Therapy:  Engaged  Modes of Intervention:  Discussion, Education and Support  Summary of Progress/Problems: Emotional Regulation: Patients will identify both negative and positive emotions. They will discuss emotions they have difficulty regulating and how they impact their lives. Patients will be asked to identify healthy coping skills to combat unhealthy reactions to negative emotions. Pt was willing to share during group and was appropriately supportive to his peers. Pt shared difficult past experiences that causes strong negative emotions. CSW provided support to pt.    Bonnita Newby G. Garnette CzechSampson MSW, LCSWA 05/20/2016, 12:03 PM

## 2016-05-20 NOTE — BHH Suicide Risk Assessment (Signed)
[facility] Admission Suicide Risk Assessment   Nursing information obtained from:    Demographic factors:    Current Mental Status:    Loss Factors:    Historical Factors:    Risk Reduction Factors:     Total Time spent with patient: 1 hour Principal Problem: Severe recurrent major depression without psychotic features [facility]) Diagnosis:   Patient Active Problem List   Diagnosis Date Noted  . Severe recurrent major depression without psychotic features (HCC) [F33.2] 05/19/2016    Priority: High  . Substance induced mood disorder (HCC) [F19.94] 05/19/2016  . Suicidal ideation [R45.851] 05/19/2016  . HTN (hypertension) [I10] 05/19/2016  . GERD (gastroesophageal reflux disease) [K21.9] 05/19/2016  . Alcohol use disorder, mild, abuse [F10.10] 05/19/2016   Subjective Data: Depression, anxiety, suicidal ideation.  Continued Clinical Symptoms:  Alcohol Use Disorder Identification Test Final Score (AUDIT): 0 The "Alcohol Use Disorders Identification Test", Guidelines for Use in Primary Care, Second Edition.  World Science writer [facility]). Score between 0-7:  no or low risk or alcohol related problems. Score between 8-15:  moderate risk of alcohol related problems. Score between 16-19:  high risk of alcohol related problems. Score 20 or above:  warrants further diagnostic evaluation for alcohol dependence and treatment.   CLINICAL FACTORS:   Severe Anxiety and/or Agitation Depression:   Comorbid alcohol abuse/dependence Impulsivity Alcohol/Substance Abuse/Dependencies   Musculoskeletal: Strength & Muscle Tone: within normal limits Gait & Station: normal Patient leans: N/A  Psychiatric Specialty Exam: Physical Exam  Nursing note and vitals reviewed.   Review of Systems  Psychiatric/Behavioral: Positive for depression and substance abuse. The patient is nervous/anxious.   All other systems reviewed and are negative.   Blood pressure 127/82, pulse 73, temperature 97.8 F (36.6 C),  temperature source Oral, resp. rate 18, height  (1.727 m), weight 112.5 kg (248 lb), SpO2 99 %.Body mass index is 37.71 kg/m.  General Appearance: Casual  Eye Contact:  Good  Speech:  Clear and Coherent  Volume:  Normal  Mood:  Euthymic  Affect:  Appropriate  Thought Process:  Goal Directed  Orientation:  Full (Time, Place, and Person)  Thought Content:  WDL  Suicidal Thoughts:  No  Homicidal Thoughts:  No  Memory:  Immediate;   Fair Recent;   Fair Remote;   Fair  Judgement:  Fair  Insight:  Good  Psychomotor Activity:  Normal  Concentration:  Concentration: Fair and Attention Span: Fair  Recall:  [employer] of Knowledge:  Fair  Language:  Fair  Akathisia:  No  Handed:  Right  AIMS (if indicated):     Assets:  Communication Skills Desire for Improvement Financial Resources/Insurance Housing Intimacy Physical Health Resilience Social Support Transportation Vocational/Educational  ADL's:  Intact  Cognition:  WNL  Sleep:  Number of Hours: 7      COGNITIVE FEATURES THAT CONTRIBUTE TO RISK:  None    SUICIDE RISK:   Minimal: No identifiable suicidal ideation.  Patients presenting with no risk factors but with morbid ruminations; may be classified as minimal risk based on the severity of the depressive symptoms   PLAN OF CARE: Hospital admission, medication management, substance abuse counseling, discharge planning.  1. Suicidal ideation. The patient adamantly denies any thoughts, intentions, or plans to hurt himself or others. He is able to contract for safety. He is forward thinking and optimistic about the future.  2. Mood. He has been maintained on Trintelix by his primary psychiatrist. We do not have it on the formulary.  3. Anxiety. This is addressed with low dose clozapine.  4. Hypertension. He is on lisinopril.  5. GERD. He is on Protonix.  6. Alcohol abuse. The patient denies alcohol problems but did drink a few beers on the night of admission. He  denies any symptoms of alcohol withdrawal. Vital signs are stable.  7. Disposition. He will be discharged to home with his wife. He will follow up with his primary psychiatrist and therapist at [facility].  I certify that inpatient services furnished can reasonably be expected to improve the patient's condition.  Kristine LineaJolanta Keeghan Bialy, MD 05/20/2016, 8:54 AM
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
## 2016-05-20 NOTE — BHH Counselor (Signed)
Pt was admitted and discharged within a 24 hour period and as such, a PSA is not needed.  Dorothe PeaJonathan F. Shadi Larner, LCSWA, LCAS  05/20/16

## 2016-05-20 NOTE — Progress Notes (Signed)
Recreation Therapy Notes  Date: 08.09.17 Time: 1:15 pm Location: Craft Room  Group Topic: Self-esteem  Goal Area(s) Addresses:  Patient will write at least one positive trait about self. Patient will verbalize benefit of having healthy self-esteem.  Behavioral Response: Attentive, Interactive, Left early  Intervention: I Am  Activity: Patients were given a worksheet with the letter I on it and instructed to write as many positive traits about themselves inside the letter.  Education: LRT educated patients on ways they can increase their self-esteem.  Education Outcome: Patient left before LRT educated group.  Clinical Observations/Feedback: Patient completed activity by writing positive traits about himself. Patient contributed to group discussion by stating how others affect his self-esteem.  Jacquelynn CreeGreene,Kade Demicco M, LRT/CTRS 05/20/2016 3:33 PM
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
## 2016-05-20 NOTE — Progress Notes (Signed)
D:Patient aware of discharge this shift . Patient returning home . Patient received all belonging locked up . Patient denies  Suicidal  And homicidal ideations  .  A: Writer instructed on discharge criteria  .Marland Kitchen.Informed of Discharge [place] Aware  Of follow up appointment . R: Patient left unit with no questions  Or concerns  With family

## 2016-05-26 ENCOUNTER — Emergency Department
Admission: EM | Admit: 2016-05-26 | Discharge: 2016-05-26 | Disposition: A | Discharge status: Home / Self Care | Payer: Medicare Other

## 2016-05-28 DIAGNOSIS — F339 Major depressive disorder, recurrent, unspecified: Secondary | ICD-10-CM

## 2016-05-28 HISTORY — DX: Major depressive disorder, recurrent, unspecified: F33.9

## 2016-06-05 DIAGNOSIS — S63104A Unspecified dislocation of right thumb, initial encounter: Secondary | ICD-10-CM | POA: Insufficient documentation

## 2016-06-05 DIAGNOSIS — F4310 Post-traumatic stress disorder, unspecified: Secondary | ICD-10-CM

## 2016-06-05 DIAGNOSIS — F419 Anxiety disorder, unspecified: Secondary | ICD-10-CM | POA: Insufficient documentation

## 2016-06-05 HISTORY — DX: Post-traumatic stress disorder, unspecified: F43.10

## 2016-06-05 HISTORY — DX: Unspecified dislocation of right thumb, initial encounter: S63.104A

## 2016-06-30 ENCOUNTER — Ambulatory Visit: Payer: Medicare Other

## 2016-06-30 ENCOUNTER — Other Ambulatory Visit: Payer: Self-pay | Admitting: Family Medicine

## 2016-06-30 DIAGNOSIS — M7088 Other soft tissue disorders related to use, overuse and pressure other site: Secondary | ICD-10-CM

## 2016-06-30 DIAGNOSIS — M7989 Other specified soft tissue disorders: Secondary | ICD-10-CM

## 2016-07-03 ENCOUNTER — Ambulatory Visit
Admission: RE | Admit: 2016-07-03 | Discharge: 2016-07-03 | Disposition: A | Discharge status: Home / Self Care | Payer: Medicare Other | Source: Ambulatory Visit | Attending: Family Medicine | Admitting: Family Medicine

## 2016-07-03 DIAGNOSIS — M7088 Other soft tissue disorders related to use, overuse and pressure other site: Secondary | ICD-10-CM | POA: Diagnosis not present

## 2016-07-03 DIAGNOSIS — M7989 Other specified soft tissue disorders: Secondary | ICD-10-CM | POA: Diagnosis not present

## 2016-07-30 ENCOUNTER — Emergency Department
Admission: EM | Admit: 2016-07-30 | Discharge: 2016-07-30 | Disposition: A | Discharge status: Home / Self Care | Payer: Medicare Other | Attending: Emergency Medicine | Admitting: Emergency Medicine

## 2016-07-30 DIAGNOSIS — Z79899 Other long term (current) drug therapy: Secondary | ICD-10-CM | POA: Insufficient documentation

## 2016-07-30 DIAGNOSIS — E119 Type 2 diabetes mellitus without complications: Secondary | ICD-10-CM | POA: Diagnosis not present

## 2016-07-30 DIAGNOSIS — R531 Weakness: Secondary | ICD-10-CM | POA: Insufficient documentation

## 2016-07-30 DIAGNOSIS — R112 Nausea with vomiting, unspecified: Secondary | ICD-10-CM | POA: Diagnosis not present

## 2016-07-30 DIAGNOSIS — I10 Essential (primary) hypertension: Secondary | ICD-10-CM | POA: Diagnosis not present

## 2016-07-30 LAB — COMPREHENSIVE METABOLIC PANEL
ALK PHOS: 68 U/L (ref 38–126)
ALT: 63 U/L (ref 17–63)
AST: 51 U/L — ABNORMAL HIGH (ref 15–41)
Albumin: 4.5 g/dL (ref 3.5–5.0)
Anion gap: 9 (ref 5–15)
BILIRUBIN TOTAL: 0.5 mg/dL (ref 0.3–1.2)
BUN: 16 mg/dL (ref 6–20)
CALCIUM: 9.3 mg/dL (ref 8.9–10.3)
CO2: 26 mmol/L (ref 22–32)
CREATININE: 1.09 mg/dL (ref 0.61–1.24)
Chloride: 103 mmol/L (ref 101–111)
Glucose, Bld: 172 mg/dL — ABNORMAL HIGH (ref 65–99)
Potassium: 3.6 mmol/L (ref 3.5–5.1)
SODIUM: 138 mmol/L (ref 135–145)
TOTAL PROTEIN: 7.4 g/dL (ref 6.5–8.1)

## 2016-07-30 LAB — CBC WITH DIFFERENTIAL/PLATELET
Basophils Absolute: 0.1 10*3/uL (ref 0–0.1)
Basophils Relative: 1 %
Eosinophils Absolute: 0.3 10*3/uL (ref 0–0.7)
Eosinophils Relative: 4 %
HEMATOCRIT: 46.3 % (ref 40.0–52.0)
HEMOGLOBIN: 16 g/dL (ref 13.0–18.0)
LYMPHS ABS: 2.5 10*3/uL (ref 1.0–3.6)
LYMPHS PCT: 35 %
MCH: 28.7 pg (ref 26.0–34.0)
MCHC: 34.5 g/dL (ref 32.0–36.0)
MCV: 83.3 fL (ref 80.0–100.0)
MONOS PCT: 7 %
Monocytes Absolute: 0.5 10*3/uL (ref 0.2–1.0)
NEUTROS ABS: 3.9 10*3/uL (ref 1.4–6.5)
NEUTROS PCT: 53 %
Platelets: 196 10*3/uL (ref 150–440)
RBC: 5.56 MIL/uL (ref 4.40–5.90)
RDW: 14 % (ref 11.5–14.5)
WBC: 7.3 10*3/uL (ref 3.8–10.6)

## 2016-07-30 LAB — LIPASE, BLOOD: Lipase: 32 U/L (ref 11–51)

## 2016-07-30 MED ORDER — ONDANSETRON HCL 4 MG/2ML IJ SOLN
4.0000 mg | Freq: Once | INTRAMUSCULAR | Status: AC
  Administered 2016-07-30: 4 mg via INTRAVENOUS
  Filled 2016-07-30: qty 2

## 2016-07-30 MED ORDER — ONDANSETRON HCL 4 MG PO TABS: 4.0000 mg | ORAL_TABLET | Freq: Every day | ORAL | 1 refills | Status: DC | PRN

## 2016-07-30 MED ORDER — SODIUM CHLORIDE 0.9 % IV SOLN
1000.0000 mL | Freq: Once | INTRAVENOUS | Status: AC
  Administered 2016-07-30: 1000 mL via INTRAVENOUS

## 2016-07-30 MED ORDER — LORAZEPAM 2 MG/ML IJ SOLN
1.0000 mg | Freq: Once | INTRAMUSCULAR | Status: AC
  Administered 2016-07-30: 1 mg via INTRAVENOUS
  Filled 2016-07-30: qty 1

## 2016-07-30 NOTE — ED Notes (Signed)
Reviewed d/c instructions, follow-up care and prescription with pt. Pt verbalized understanding 

## 2016-07-30 NOTE — ED Triage Notes (Signed)
Pt c/o nausea, weakness and malaise. Pt denies vomiting. Pt reports eating sausage before bed at 1830, and waking up weak and pt states: "I just felt wrong"

## 2016-07-30 NOTE — ED Provider Notes (Signed)
[facility] Emergency Department Provider Note        Time seen: ----------------------------------------- 9:49 PM on 07/30/2016 -----------------------------------------    I have reviewed the triage vital signs and the nursing notes.   HISTORY  Chief Complaint Nausea and Weakness    HPI Todd [name] is a [age] y.o. male who presents the ER for nausea, weakness and general ill feeling. Patient denies vomiting, reports eating a sausage for when to bed at 6:30 and then he woke up he states he just fell wrong like something was abnormal. He feels very weak and feels like he needs to throw up but can't. He denies diarrhea or other complaints. He denies a history of these symptoms.   Past Medical History:  Diagnosis Date  . Anxiety   . Depression   . Diabetes mellitus without complication ([facility])   . GERD (gastroesophageal reflux disease)   . Hypertension   . Sleep apnea     Patient Active Problem List   Diagnosis Date Noted  . Substance induced mood disorder ([facility]) 05/19/2016  . Severe recurrent major depression without psychotic features ([facility]) 05/19/2016  . Suicidal ideation 05/19/2016  . HTN (hypertension) 05/19/2016  . GERD (gastroesophageal reflux disease) 05/19/2016  . Alcohol use disorder, mild, abuse 05/19/2016    Past Surgical History:  Procedure Laterality Date  . LEG AMPUTATION BELOW KNEE     left  . SHOULDER SURGERY      Allergies Benadryl [diphenhydramine]  Social History Social History  Substance Use Topics  . Smoking status: Never Smoker  . Smokeless tobacco: Never Used  . Alcohol use No    Review of Systems Constitutional: Negative for fever. Cardiovascular: Negative for chest pain. Respiratory: Negative for shortness of breath. Gastrointestinal: Negative for abdominal pain, Positive for nausea Genitourinary: Negative for dysuria. Musculoskeletal: Negative for back pain. Skin: Negative for rash. Neurological: Negative  for headaches, positive for generalized weakness  10-point ROS otherwise negative.  ____________________________________________   PHYSICAL EXAM:  VITAL SIGNS: ED Triage Vitals [07/30/16 2137]  Enc Vitals Group     BP      Pulse      Resp      Temp      Temp src      SpO2 97 %     Weight      Height      Head Circumference      Peak Flow      Pain Score      Pain Loc      Pain Edu?      Excl. in GC?    Constitutional: Alert and oriented. Anxious, no distress Eyes: Conjunctivae are normal. PERRL. Normal extraocular movements. ENT   Head: Normocephalic and atraumatic.   Nose: No congestion/rhinnorhea.   Mouth/Throat: Mucous membranes are moist.   Neck: No stridor. Cardiovascular: Normal rate, regular rhythm. No murmurs, rubs, or gallops. Respiratory: Normal respiratory effort without tachypnea nor retractions. Breath sounds are clear and equal bilaterally. No wheezes/rales/rhonchi. Gastrointestinal: Soft and nontender. Normal bowel sounds Musculoskeletal: Nontender with normal range of motion in all extremities. No lower extremity tenderness nor edema. Neurologic:  Normal speech and language. No gross focal neurologic deficits are appreciated.  Skin:  Skin is warm, dry and intact. No rash noted. Psychiatric: Mood and affect are normal. Speech and behavior are normal.   ____________________________________________  ED COURSE:  Pertinent labs & imaging results that were available during my care of the patient were reviewed by me and considered in  my medical decision making (see chart for details). Clinical Course  Patient is in no distress, mildly anxious. We will assess with basic labs, give IV fluids and antiemetics.  Procedures ____________________________________________   LABS (pertinent positives/negatives)  Labs Reviewed  COMPREHENSIVE METABOLIC PANEL - Abnormal; Notable for the following:       Result Value   Glucose, Bld 172 (*)    AST 51  (*)    All other components within normal limits  CBC WITH DIFFERENTIAL/PLATELET  LIPASE, BLOOD   ____________________________________________  FINAL ASSESSMENT AND PLAN  Weakness, nausea  Plan: Patient with labs as dictated above. Patient is in no acute distress, labs are reassuring. He is currently feeling better. He is stable for outpatient follow-up with his doctor.   Emily Filbert, MD   Note: This dictation was prepared with Dragon dictation. Any transcriptional errors that result from this process are unintentional    Emily Filbert, MD 07/30/16 2311

## 2016-10-01 ENCOUNTER — Ambulatory Visit: Payer: Medicare Other | Attending: Orthopedic Surgery | Admitting: Physical Therapy

## 2016-10-01 DIAGNOSIS — R262 Difficulty in walking, not elsewhere classified: Secondary | ICD-10-CM | POA: Insufficient documentation

## 2016-10-01 DIAGNOSIS — M25561 Pain in right knee: Secondary | ICD-10-CM | POA: Insufficient documentation

## 2016-10-01 DIAGNOSIS — G8929 Other chronic pain: Secondary | ICD-10-CM | POA: Insufficient documentation

## 2016-10-01 DIAGNOSIS — M6281 Muscle weakness (generalized): Secondary | ICD-10-CM | POA: Insufficient documentation

## 2016-10-01 NOTE — Therapy (Signed)
[facility] [address] [place], [address] Phone: [phone]   Fax:  [phone]  Physical Therapy Evaluation  Patient Details  Name: Todd [name] MRN: [id number] Date of Birth: Feb 16, 1966 Referring Provider: Dr. Lanell Matar  Encounter Date: 10/01/2016      PT End of Session - 10/01/16 1409    Visit Number 1   Number of Visits 13   Date for PT Re-Evaluation 11/26/16   PT Start Time 1305   PT Stop Time 1357   PT Time Calculation (min) 52 min   Activity Tolerance Patient tolerated treatment well   Behavior During Therapy [facility] for tasks assessed/performed      Past Medical History:  Diagnosis Date  . Anxiety   . Depression   . Diabetes mellitus without complication (HCC)   . GERD (gastroesophageal reflux disease)   . Hypertension   . Sleep apnea     Past Surgical History:  Procedure Laterality Date  . LEG AMPUTATION BELOW KNEE     left  . SHOULDER SURGERY      There were no vitals filed for this visit.       Subjective Assessment - 10/01/16 1406    Subjective Patient reports he began to develop R sided knee pain after a fall in a store roughly 2.5 years ago. He reports the pain has only gotten worse and now limits his ability to ambulate in stores like [employer]. 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 Reports he has a touch of arthritis and "torn carilage" in his R knee. He has had his R shoulder repaired, is waiting on his L shoulder now. He endorses a history of back pain and R sided sciatic pain.    Limitations Walking;House hold activities;Lifting   Patient Stated Goals To walk through [employer] and return to exercise in the gym    Currently in Pain? Yes   Pain Score --  Mild, but limiting pain in anterior knee   Pain Location Knee   Pain Orientation Right   Pain Descriptors / Indicators Aching;Constant   Pain Type Chronic pain   Pain Onset  More than a month ago   Pain Frequency Constant   Aggravating Factors  [place], increased walking   Pain Relieving Factors Rest             [facility] PT Assessment - 10/01/16 1416      Assessment   Medical Diagnosis R knee pain   Referring Provider Dr. 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     Cognition   Overall Cognitive Status Within Functional Limits for tasks assessed     Observation/Other Assessments   Lower Extremity Functional Scale  20     Sensation   Light Touch Appears Intact     Sit to Stand   Comments --  Shifts weight to R, no hands     PROM   Overall PROM Comments --  R hip ER/IR limited ~  25% and painful, flexion limited 25-50   Right Ankle Dorsiflexion 9     Ely's Test   Findings Positive   Side Right     Calf Stretching manually by PT x 5 minutes to end range in sitting -- reported decreased stiffness around knee in gait   Ely's position stretch for R quadricep x 10 repetitions for 10-15" holds -- reported relief with gait after completion.   Educated patient on set up for half kneeling hip flexor stretch, cuing for position and appropriate weight shifting to complete                       PT Education - 10/01/16 1337    Education provided Yes   Person(s) Educated Patient   Methods Explanation;Demonstration;Handout   Comprehension Returned demonstration;Verbalized understanding             PT Long Term Goals - 10/01/16 1413      PT LONG TERM GOAL #1   Title Patient will be able to tolerate ambulating in [employer] to return to ADLs.    Baseline Unable to ambulate in [employer] secondary to  pain    Time 8   Period Weeks   Status New     PT LONG TERM GOAL #2   Title Patient will ambulate at least 700' in 6MW test with no increase in knee pain to demonstrate improved tolerance for community mobility.    Time 8   Period Weeks   Status New     PT LONG TERM GOAL #3   Title Patient will report LEFS score > 29/80 to demonstrate improved tolerance for ADLs.    Baseline 20/80   Time 8   Period Weeks   Status New     PT LONG TERM GOAL #4   Title Patient will report average pain of less than 3/10 to demonstrate improved tolerance for ADLs.    Baseline 5/10   Time 8   Period Weeks   Status New               Plan - 10/01/16 1409    Clinical Impression Statement Patient is a 50 y/o male that presents with chronic R knee pain, that appears related to decreased ROM secondary to quadricep tightness, calf tightness, and decreased R hip extension. He has Trendelenburg bilaterally, in L stance > R stance. Noted to functionally have less DF and hip extension on RLE during stance phase. He is also noted to circumduct and medially whip LLE, he reports modifications are forthcoming with his prosthetic. After stretching to calf, quadricep he reports significant reduction in pain with ambulation. Given his response it is likely he would benefit from skilled PT services.    Rehab Potential Good   Clinical Impairments Affecting Rehab Potential L sided prosthetic is being adjusted for fit.    PT Frequency 2x / week   PT Duration 6 weeks   PT Treatment/Interventions Gait training;Taping;Balance training;Therapeutic exercise;Therapeutic activities;Moist Heat;Electrical Stimulation;Energy conservation   PT Next Visit Plan Progress stretching as tolerated, begin glute med strengthening.    PT Home Exercise Plan Hip flexor, quad, calf stretches.    Consulted and Agree with Plan of Care Patient      Patient will benefit from skilled therapeutic intervention in order to improve the following  deficits and impairments:  Abnormal gait, Decreased strength, Pain, Decreased endurance, Decreased balance, Decreased activity tolerance, Difficulty walking, Decreased range of motion  Visit Diagnosis: Difficulty in walking, not  elsewhere classified - Plan: PT plan of care cert/re-cert  Chronic pain of right knee - Plan: PT plan of care cert/re-cert  Muscle weakness (generalized) - Plan: PT plan of care cert/re-cert      G-Codes - 10/01/16 1415    Functional Assessment Tool Used LEFS, patient report    Functional Limitation Mobility: Walking and moving around   Mobility: Walking and Moving Around Current Status (Q0347(G8978) At least 20 percent but less than 40 percent impaired, limited or restricted   Mobility: Walking and Moving Around Goal Status (Q2595(G8979) At least 1 percent but less than 20 percent impaired, limited or restricted       Problem List Patient Active Problem List   Diagnosis Date Noted  . Substance induced mood disorder (HCC) 05/19/2016  . Severe recurrent major depression without psychotic features (HCC) 05/19/2016  . Suicidal ideation 05/19/2016  . HTN (hypertension) 05/19/2016  . GERD (gastroesophageal reflux disease) 05/19/2016  . Alcohol use disorder, mild, abuse 05/19/2016    Kerin RansomPatrick A Kateryn Marasigan, PT, DPT    10/01/2016, 2:21 PM  [facility] [address] [place], [place], [address] Phone: [phone]   Fax:  [phone]  Name: Todd [name] MRN: [id number] Date of Birth: 03/09/1966

## 2016-10-01 NOTE — Patient Instructions (Signed)
DF - 9 degrees on RLE

## 2016-10-06 ENCOUNTER — Ambulatory Visit: Payer: Medicare Other | Admitting: Physical Therapy

## 2016-10-06 DIAGNOSIS — G8929 Other chronic pain: Secondary | ICD-10-CM

## 2016-10-06 DIAGNOSIS — M6281 Muscle weakness (generalized): Secondary | ICD-10-CM

## 2016-10-06 DIAGNOSIS — R262 Difficulty in walking, not elsewhere classified: Secondary | ICD-10-CM | POA: Diagnosis not present

## 2016-10-06 DIAGNOSIS — M25561 Pain in right knee: Secondary | ICD-10-CM

## 2016-10-06 NOTE — Patient Instructions (Signed)
Calf stretches   test  - 1100'   Standing hip abductions with BW x 15 (easy) with red t-band x 15  On RLE, x 12 on LLE for 2 sets

## 2016-10-06 NOTE — Therapy (Signed)
[facility] [address] [place], [place], [address] Phone: [phone]   Fax:  [phone]  Physical Therapy Treatment  Patient Details  Name: Todd [name] MRN: [id number] Date of Birth: 05/19/1966 Referring Provider: Dr. Lanell Matarel Gaizo  Encounter Date: 10/06/2016      PT End of Session - 10/06/16 1040    Visit Number 1   Number of Visits 13   Date for PT Re-Evaluation 11/26/16   PT Start Time 1038   PT Stop Time 1118   PT Time Calculation (min) 40 min   Activity Tolerance Patient tolerated treatment well   Behavior [facility] Therapy [facility] for tasks assessed/performed      Past Medical History:  Diagnosis Date  . Anxiety   . Depression   . Diabetes mellitus without complication (HCC)   . GERD (gastroesophageal reflux disease)   . Hypertension   . Sleep apnea     Past Surgical History:  Procedure Laterality Date  . LEG AMPUTATION BELOW KNEE     left  . SHOULDER SURGERY      There were no vitals filed for this visit.      Subjective Assessment - 10/06/16 1039    Subjective Patient reports he has been feeling much better with his knee, he has been walking more with less discomfort. He reports he may have overdone it with the stretching over the weekend as he got sore.    Limitations Walking;House hold activities;Lifting   Patient Stated Goals [place] [employer] and return to exercise in the gym    Currently in Pain? No/denies         Calf stretches attempted on steps with RLE only on, bilateral LEs (felt discomfort in posterior knee due to hyper-extension) switched to completing on wall (much more comfortable, appropriate stretch felt). Completed 2 bouts x 10 repetitions for 6"-10" holds   6MW test  - 1100' -- mild discomfort noted in L stump area. Noted to have good toe off and DF in swing on RLE, continues to perform circumduction/whip on LLE. Trendelenburg noted bilaterally.   Standing hip  abductions with BW x 15 (easy) with red t-band x 15  On RLE, x 12 on LLE for 2 sets  Standing hip extensions 2x10 per side with red tband (felt in back initially, educated to decrease ROM which reduced symptoms)   Sit to stands without use of UEs (felt a good sense of fatigue in lateral R leg). X 10 repetitions, continues to shift weight to RLE. x10                          PT Education - 10/06/16 1039    Education provided Yes   Education Details Progression of strength training, benefits of resistance training for glycemic control.    Person(s) Educated Patient   Methods Explanation;Demonstration   Comprehension Verbalized understanding;Returned demonstration             PT Long Term Goals - 10/06/16 1136      PT LONG TERM GOAL #1   Title Patient will be able to tolerate ambulating in [employer] to return to ADLs.    Baseline Unable to ambulate in [employer] secondary to pain    Time 8   Period Weeks   Status New     PT LONG TERM GOAL #2   Title Patient will ambulate at least 700' in 6MW test with no increase in  knee pain to demonstrate improved tolerance for community mobility.    Baseline 1100' on 12/26   Time 8   Period Weeks   Status Achieved     PT LONG TERM GOAL #3   Title Patient will report LEFS score > 29/80 to demonstrate improved tolerance for ADLs.    Baseline 20/80   Time 8   Period Weeks   Status New     PT LONG TERM GOAL #4   Title Patient will report average pain of less than 3/10 to demonstrate improved tolerance for ADLs.    Baseline 5/10   Time 8   Period Weeks   Status New               Plan - 10/06/16 1118    Clinical Impression Statement Patient demonstrating improved toe off and ankle DF throughout swing phase on RLE, LLE continues to circumduct likely a combination of hip flexor weakness and prosthetic design. Patient reports he is having less pain around knee, more tightness in tibialis anterior area on RLE. Patient  demonstrates excellent 6MW test tolerance, though noted discomfort in L stump area secondary to fatigue. Patient would continue to benefit from strengthening and ROM progression to improve tolerance for ambulation.    Rehab Potential Good   Clinical Impairments Affecting Rehab Potential L sided prosthetic is being adjusted for fit.    PT Frequency 2x / week   PT Duration 6 weeks   PT Treatment/Interventions Gait training;Taping;Balance training;Therapeutic exercise;Therapeutic activities;Moist Heat;Electrical Stimulation;Energy conservation   PT Next Visit Plan Progress stretching as tolerated, begin glute med strengthening.    PT Home Exercise Plan Hip flexor, quad, calf stretches.    Consulted and Agree with Plan of Care Patient      Patient will benefit from skilled therapeutic intervention in order to improve the following deficits and impairments:  Abnormal gait, Decreased strength, Pain, Decreased endurance, Decreased balance, Decreased activity tolerance, Difficulty walking, Decreased range of motion  Visit Diagnosis: Difficulty in walking, not elsewhere classified  Chronic pain of right knee  Muscle weakness (generalized)     Problem List Patient Active Problem List   Diagnosis Date Noted  . Substance induced mood disorder (HCC) 05/19/2016  . Severe recurrent major depression without psychotic features (HCC) 05/19/2016  . Suicidal ideation 05/19/2016  . HTN (hypertension) 05/19/2016  . GERD (gastroesophageal reflux disease) 05/19/2016  . Alcohol use disorder, mild, abuse 05/19/2016   Kerin RansomPatrick A Bryna Razavi, PT, DPT    10/06/2016, 11:37 AM   [facility] [address] [place], [place], [address] Phone: [phone]   Fax:  [phone]  Name: Todd [name] MRN: [id number] Date of Birth: 02/11/1966

## 2016-10-14 ENCOUNTER — Ambulatory Visit: Payer: Medicare Other | Admitting: Physical Therapy

## 2016-10-15 ENCOUNTER — Encounter: Payer: Self-pay | Admitting: Physical Therapy

## 2016-10-19 ENCOUNTER — Ambulatory Visit: Payer: Medicare Other | Attending: Orthopedic Surgery | Admitting: Physical Therapy

## 2016-10-19 DIAGNOSIS — G8929 Other chronic pain: Secondary | ICD-10-CM | POA: Diagnosis present

## 2016-10-19 DIAGNOSIS — M25561 Pain in right knee: Secondary | ICD-10-CM | POA: Insufficient documentation

## 2016-10-19 DIAGNOSIS — R262 Difficulty in walking, not elsewhere classified: Secondary | ICD-10-CM | POA: Diagnosis present

## 2016-10-19 DIAGNOSIS — M6281 Muscle weakness (generalized): Secondary | ICD-10-CM | POA: Insufficient documentation

## 2016-10-19 NOTE — Therapy (Signed)
[facility] [address] [place], [address] Phone: [phone]   Fax:  [phone]  Physical Therapy Treatment  Patient Details  Name: Todd [name] MRN: [id number] Date of Birth: 1965-12-14 Referring Provider: Dr. Lanell Matar  Encounter Date: 10/19/2016      PT End of Session - 10/19/16 1331    Visit Number 3   Number of Visits 13   Date for PT Re-Evaluation 11/26/16   PT Start Time 1307   PT Stop Time 1345   PT Time Calculation (min) 38 min   Activity Tolerance Patient tolerated treatment well   Behavior During Therapy [facility] for tasks assessed/performed      Past Medical History:  Diagnosis Date  . Anxiety   . Depression   . Diabetes mellitus without complication (HCC)   . GERD (gastroesophageal reflux disease)   . Hypertension   . Sleep apnea     Past Surgical History:  Procedure Laterality Date  . LEG AMPUTATION BELOW KNEE     left  . SHOULDER SURGERY      There were no vitals filed for this visit.      Subjective Assessment - 10/19/16 1309    Subjective Patient reports he is going in for an MRI on his L shoulder today. He reports his knee is feeling better, but so-so in [place] (which is more than he had been able to). 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    Limitations Walking;House hold activities;Lifting   Patient Stated Goals To walk through [place] and return to exercise in the gym    Currently in Pain? No/denies       Standing hip abductions on Matrix with 25# x 12, 40# x 12, 55# x 10  Standing single leg deadlifts with 1 HHA x 12 per side (required bilateral UE assist)   Standing side stepping with green t-band x 10 per side for 2 sets   Total gym level 23 bilateral squats x 5 no problem, unable to complete single leg. Decreased to 15 level for 5 bilateral concentric, on LLE only for eccentric. 2nd round x 5 (was easy, so did 7 more at level 17) --  3rd round on LLE only x 10 repetitions, 4th set x 7 repetitions on LLE only (level 17)  OMEGA HS curls x 12 for 2 sets bilaterally x 10# (felt appropriately)   RLE mini squats on blue foam with bilateral UE support x 15 ( to increase co-contraction of quad/HS                           PT Education - 10/19/16 1331    Education provided Yes   Education Details Will continue to progress strengthening routine in clinic, he is making good progress with pain control and ability to ambulate prolonged distances.    Person(s) Educated Patient   Methods Explanation;Demonstration   Comprehension Verbalized understanding;Returned demonstration             PT Long Term Goals - 10/06/16 1136      PT LONG TERM GOAL #1   Title Patient will be able to tolerate ambulating in [employer] to return to ADLs.    Baseline Unable to ambulate in [employer] secondary to pain    Time 8   Period Weeks   Status New     PT LONG TERM GOAL #2   Title Patient  will ambulate at least 700' in 6MW test with no increase in knee pain to demonstrate improved tolerance for community mobility.    Baseline 1100' on 12/26   Time 8   Period Weeks   Status Achieved     PT LONG TERM GOAL #3   Title Patient will report LEFS score > 29/80 to demonstrate improved tolerance for ADLs.    Baseline 20/80   Time 8   Period Weeks   Status New     PT LONG TERM GOAL #4   Title Patient will report average pain of less than 3/10 to demonstrate improved tolerance for ADLs.    Baseline 5/10   Time 8   Period Weeks   Status New               Plan - 10/19/16 1332    Clinical Impression Statement Patient reports he is able to ambulate longer distances without as much discomfort and has been compliant with HEP which has helped with pain control. He continues to have deficits with hip and LLE strength, however he is able to perform increased progression of difficulty in this session on total gym.    Rehab  Potential Good   Clinical Impairments Affecting Rehab Potential L sided prosthetic is being adjusted for fit.    PT Frequency 2x / week   PT Duration 6 weeks   PT Treatment/Interventions Gait training;Taping;Balance training;Therapeutic exercise;Therapeutic activities;Moist Heat;Electrical Stimulation;Energy conservation   PT Next Visit Plan Progress stretching as tolerated, begin glute med strengthening.    PT Home Exercise Plan Hip flexor, quad, calf stretches.    Consulted and Agree with Plan of Care Patient      Patient will benefit from skilled therapeutic intervention in order to improve the following deficits and impairments:  Abnormal gait, Decreased strength, Pain, Decreased endurance, Decreased balance, Decreased activity tolerance, Difficulty walking, Decreased range of motion  Visit Diagnosis: Difficulty in walking, not elsewhere classified  Chronic pain of right knee  Muscle weakness (generalized)     Problem List Patient Active Problem List   Diagnosis Date Noted  . Substance induced mood disorder (HCC) 05/19/2016  . Severe recurrent major depression without psychotic features (HCC) 05/19/2016  . Suicidal ideation 05/19/2016  . HTN (hypertension) 05/19/2016  . GERD (gastroesophageal reflux disease) 05/19/2016  . Alcohol use disorder, mild, abuse 05/19/2016   Kerin RansomPatrick A Zeanna Sunde, PT, DPT    10/19/2016, 1:54 PM  [facility] [address] [place], [place], [address] Phone: [phone]   Fax:  [phone]  Name: Todd [name] MRN: [id number] Date of Birth: 07/24/1966

## 2016-10-19 NOTE — Patient Instructions (Addendum)
Standing hip abductions on Matrix with 25# x 12, 40# x 12, 55# x 10  Standing single leg deadlifts with 1 HHA x 12 per side (required bilateral UE assist)   Standing side stepping with green t-band x 10 per side for 2 sets   Total gym level 23 bilateral squats x 5 no problem, unable to complete single leg. Decreased to 15 level for 5 bilateral concentric, on LLE only for eccentric. 2nd round x 5 (was easy, so did 7 more at level 17) -- 3rd round on LLE only x 10 repetitions, 4th set x 7 repetitions on LLE only (level 17)  OMEGA HS curls x 12 for 2 sets bilaterally x 10# (felt appropriately)

## 2016-10-21 ENCOUNTER — Ambulatory Visit: Payer: Medicare Other | Admitting: Physical Therapy

## 2016-10-21 DIAGNOSIS — M25561 Pain in right knee: Secondary | ICD-10-CM

## 2016-10-21 DIAGNOSIS — R262 Difficulty in walking, not elsewhere classified: Secondary | ICD-10-CM

## 2016-10-21 DIAGNOSIS — G8929 Other chronic pain: Secondary | ICD-10-CM

## 2016-10-21 DIAGNOSIS — M6281 Muscle weakness (generalized): Secondary | ICD-10-CM

## 2016-10-21 NOTE — Patient Instructions (Signed)
TRX squats - able to get to 90 degrees of knee bend with fatigue only after 12th rep  Single leg squats on TG level 17 (x 13 fairly easy) -- level 19 x 12 (difficult) -- x12 -- progressed to level 22 x 10 (cuing    Standing hip abduction on MATRIX --40# x 10 bilaterally for 3 sets (2nd and 3rd at 55#)

## 2016-10-21 NOTE — Therapy (Signed)
[facility] PHYSICAL AND [facility]ORTS MEDICINE [address], [place], [address] Phone: [phone]   Fax:  [phone]  Physical Therapy Treatment  Patient Details  Name: Todd [name] MRN: [id number] Date of Birth: [birth year] Referring Provider: Dr. Lanell Matar  Encounter Date: 10/21/2016      PT End of Session - 10/21/16 1343    Visit Number 4   Number of Visits 13   Date for PT Re-Evaluation 11/26/16   PT Start Time 1241   PT Stop Time 1326   PT Time Calculation (min) 45 min   Activity Tolerance Patient tolerated treatment well   Behavior During Therapy [facility] for tasks assessed/performed      Past Medical History:  Diagnosis Date  . Anxiety   . Depression   . Diabetes mellitus without complication (HCC)   . GERD (gastroesophageal reflux disease)   . Hypertension   . Sleep apnea     Past Surgical History:  Procedure Laterality Date  . LEG AMPUTATION BELOW KNEE     left  . SHOULDER SURGERY      There were no vitals filed for this visit.      Subjective Assessment - 10/21/16 1259    Subjective Patient states he had some muscle soreness after his last PT session, though this has resolved. He reports he went in for his MRI of his L shoulder, he is not sure of the results at this time. Reports his knee is feeling better overall.    Limitations Walking;House hold activities;Lifting   Patient Stated Goals To walk through [place] and return to exercise in the gym    Currently in Pain? No/denies     TRX squats - able to get to 90 degrees of knee bend with fatigue only after 12th rep  Single leg squats on TG level 17 (x 13 fairly easy) -- level 19 x 12 (difficult) -- x12 -- progressed to level 22 x 10 (cuing   Standing hip abduction on MATRIX --40# x 10 bilaterally for 3 sets (2nd and 3rd at 55#) -- fatiguing  Side planks x 10 for 2" holds on L side (challenging) with knees down   Gait observation - noted to have lateral trunk  flexion on LLE in stance phase to decrease moment arm of RLE pulling into medial rotation. To cue out of this provided 20# KB in his LUE to increase lateral flexion tendency, with cuing he was able to kick on his L gluteal and bring his shoulder more vertically in line with his L hip in stance phase, resulting in less shoulder motion and improved mechanics. No pain reported by patient.                             PT Education - 10/21/16 1341    Education provided Yes   Education Details That he is using lateral trunk flexion to compensate for weak L hip abductors.    Person(s) Educated Patient   Methods Explanation;Demonstration   Comprehension Verbalized understanding;Returned demonstration             PT Long Term Goals - 10/06/16 1136      PT LONG TERM GOAL #1   Title Patient will be able to tolerate ambulating in [employer] to return to ADLs.    Baseline Unable to ambulate in [employer] secondary to pain    Time 8   Period Weeks   Status New  PT LONG TERM GOAL #2   Title Patient will ambulate at least 700' in 6MW test with no increase in knee pain to demonstrate improved tolerance for community mobility.    Baseline 1100' on 12/26   Time 8   Period Weeks   Status Achieved     PT LONG TERM GOAL #3   Title Patient will report LEFS score > 29/80 to demonstrate improved tolerance for ADLs.    Baseline 20/80   Time 8   Period Weeks   Status New     PT LONG TERM GOAL #4   Title Patient will report average pain of less than 3/10 to demonstrate improved tolerance for ADLs.    Baseline 5/10   Time 8   Period Weeks   Status New               Plan - 10/21/16 1346    Clinical Impression Statement Patient noted to have lateral trunk flexion to compensate for weak L hip abductors. He was noted to improve with weight pulling into further lateral trunk flexion and cuing to maintain shoulder height (felt glutes kick on). He was encouraged to increase  his ambulation distance this weekend, continue with stretching. He appears to be progressing well with kinematics of gait and subsequent reports of pain.   Rehab Potential Good   Clinical Impairments Affecting Rehab Potential L sided prosthetic is being adjusted for fit.    PT Frequency 2x / week   PT Duration 6 weeks   PT Treatment/Interventions Gait training;Taping;Balance training;Therapeutic exercise;Therapeutic activities;Moist Heat;Electrical Stimulation;Energy conservation   PT Next Visit Plan Progress stretching as tolerated, begin glute med strengthening.    PT Home Exercise Plan Hip flexor, quad, calf stretches.    Consulted and Agree with Plan of Care Patient      Patient will benefit from skilled therapeutic intervention in order to improve the following deficits and impairments:  Abnormal gait, Decreased strength, Pain, Decreased endurance, Decreased balance, Decreased activity tolerance, Difficulty walking, Decreased range of motion  Visit Diagnosis: Difficulty in walking, not elsewhere classified  Chronic pain of right knee  Muscle weakness (generalized)     Problem List Patient Active Problem List   Diagnosis Date Noted  . Substance induced mood disorder (HCC) 05/19/2016  . Severe recurrent major depression without psychotic features (HCC) 05/19/2016  . Suicidal ideation 05/19/2016  . HTN (hypertension) 05/19/2016  . GERD (gastroesophageal reflux disease) 05/19/2016  . Alcohol use disorder, mild, abuse 05/19/2016   Kerin RansomPatrick A McNamara, PT, DPT    10/21/2016, 1:51 PM  [facility] [address] , [place], [id number] Phone: [phone]   Fax:  [phone]  Name: Todd [name] MRN: [id number] Date of Birth: 09/10/1966

## 2016-10-26 ENCOUNTER — Ambulatory Visit: Payer: Medicare Other | Admitting: Physical Therapy

## 2016-10-28 ENCOUNTER — Ambulatory Visit: Payer: Medicare Other | Admitting: Physical Therapy

## 2016-11-02 ENCOUNTER — Ambulatory Visit: Payer: Medicare Other | Admitting: Physical Therapy

## 2016-11-02 DIAGNOSIS — M25561 Pain in right knee: Secondary | ICD-10-CM

## 2016-11-02 DIAGNOSIS — R262 Difficulty in walking, not elsewhere classified: Secondary | ICD-10-CM

## 2016-11-02 DIAGNOSIS — G8929 Other chronic pain: Secondary | ICD-10-CM

## 2016-11-02 DIAGNOSIS — M6281 Muscle weakness (generalized): Secondary | ICD-10-CM

## 2016-11-02 NOTE — Therapy (Signed)
[place] PHYSICAL AND SPORTS MEDICINE [address] [place], [address] Phone: [phone]   Fax:  [phone]  Physical Therapy Treatment  Patient Details  Name: Todd [name] MRN: [id number] Date of Birth: [birth year] Referring Provider: Dr. Rozelle [name]  Encounter Date: 11/02/2016      PT End of Session - 11/02/16 1414    Visit Number 5   Number of Visits 13   Date for PT Re-Evaluation 11/26/16   PT Start Time 1350   PT Stop Time 1408   PT Time Calculation (min) 18 min   Activity Tolerance Patient tolerated treatment well   Behavior During Therapy [facility] for tasks assessed/performed      Past Medical History:  Diagnosis Date  . Anxiety   . Depression   . Diabetes mellitus without complication ([place])   . GERD (gastroesophageal reflux disease)   . Hypertension   . Sleep apnea     Past Surgical History:  Procedure Laterality Date  . LEG AMPUTATION BELOW KNEE     left  . SHOULDER SURGERY      There were no vitals filed for this visit.      Subjective Assessment - 11/02/16 1414    Subjective Patient reports he is feeling much better with his R knee, he was able to ambulate around [facility] without much difficulty or pain [name] his RLE.    Limitations Walking;House hold activities;Lifting   Patient Stated Goals To walk through [place] and return to exercise in the gym    Currently in Pain? No/denies      Patient reports he has been able to ambulate through [facility] without much discomfort, reports he believes any symptoms he has are related to his prosthetic. He is scheduled for a L RTC repair surgery and states he is satisfied with his care. Educated patient [name] maintenance with HEP.                            PT Education - 11/02/16 1422    Education provided Yes   Education Details Continue with exercises as these have been helpful.    Person(s) Educated Patient   Methods Explanation   Comprehension  Verbalized understanding             PT Long Term Goals - 11/02/16 1416      PT LONG TERM GOAL #1   Title Patient will be able to tolerate ambulating in [employer] to return to ADLs.    Baseline Unable to ambulate in [employer] secondary to pain    Time 8   Period Weeks   Status Achieved     PT LONG TERM GOAL #2   Title Patient will ambulate at least 700' in 6MW test with no increase in knee pain to demonstrate improved tolerance for community mobility.    Baseline 1100' [name] 12/26   Time 8   Period Weeks   Status Achieved     PT LONG TERM GOAL #3   Title Patient will report LEFS score > 29/80 to demonstrate improved tolerance for ADLs.    Baseline 20/80   Time 8   Period Weeks   Status [name]-going     PT LONG TERM GOAL #4   Title Patient will report average pain of less than 3/10 to demonstrate improved tolerance for ADLs.    Baseline 5/10   Time 8   Period Weeks   Status Partially Met  Plan - 11-09-16 1416    Clinical Impression Statement Patient reports he went to [facility] and was able to ambulate around without complaints, which is what he was looking for out of therapy. He reports he is satisfied with his progress and does not feel he needs any more therapy for this current issue as he will be having a L RTC Repair soon. PT will discharge patient from this current episode.    Rehab Potential Good   Clinical Impairments Affecting Rehab Potential L sided prosthetic is being adjusted for fit.    PT Frequency 2x / week   PT Duration 6 weeks   PT Treatment/Interventions Gait training;Taping;Balance training;Therapeutic exercise;Therapeutic activities;Moist Heat;Electrical Stimulation;Energy conservation   PT Next Visit Plan Progress stretching as tolerated, begin glute med strengthening.    PT Home Exercise Plan Hip flexor, quad, calf stretches.    Consulted and Agree with Plan of Care Patient      Patient will benefit from skilled therapeutic  intervention in order to improve the following deficits and impairments:  Abnormal gait, Decreased strength, Pain, Decreased endurance, Decreased balance, Decreased activity tolerance, Difficulty walking, Decreased range of motion  Visit Diagnosis: Difficulty in walking, not elsewhere classified  Chronic pain of right knee  Muscle weakness (generalized)       G-Codes - 2016/11/09 1417    Functional Assessment Tool Used Patient Report    Functional Limitation Mobility: Walking and moving around   Mobility: Walking and Moving Around Current Status [phone]) At least 1 percent but less than 20 percent impaired, limited or restricted   Mobility: Walking and Moving Around Goal Status [phone]) At least 1 percent but less than 20 percent impaired, limited or restricted   Mobility: Walking and Moving Around Discharge Status [phone]) At least 1 percent but less than 20 percent impaired, limited or restricted      Problem List Patient Active Problem List   Diagnosis Date Noted  . Substance induced mood disorder ([place]) 05/19/2016  . Severe recurrent major depression without psychotic features ([place]) 05/19/2016  . Suicidal ideation 05/19/2016  . HTN (hypertension) 05/19/2016  . GERD (gastroesophageal reflux disease) 05/19/2016  . Alcohol use disorder, mild, abuse 05/19/2016   Kerman Passey, PT, DPT    09-Nov-2016, 2:25 PM  [facility] PHYSICAL AND SPORTS MEDICINE [address], [place], [address] Phone: [phone]   Fax:  [phone]  Name: Todd [name] MRN: [id number] Date of Birth: 1966/01/02

## 2016-11-04 ENCOUNTER — Encounter: Payer: Medicare Other | Admitting: Physical Therapy

## 2016-11-09 ENCOUNTER — Encounter: Payer: Medicare Other | Admitting: Physical Therapy

## 2016-11-09 DIAGNOSIS — M19012 Primary osteoarthritis, left shoulder: Secondary | ICD-10-CM | POA: Insufficient documentation

## 2016-11-09 HISTORY — DX: Primary osteoarthritis, left shoulder: M19.012

## 2016-11-11 ENCOUNTER — Encounter: Payer: Medicare Other | Admitting: Physical Therapy

## 2016-11-17 ENCOUNTER — Emergency Department
Admission: EM | Admit: 2016-11-17 | Discharge: 2016-11-17 | Disposition: A | Discharge status: Home / Self Care | Payer: Medicare Other | Attending: Emergency Medicine | Admitting: Emergency Medicine

## 2016-11-17 ENCOUNTER — Encounter: Payer: Self-pay | Admitting: Emergency Medicine

## 2016-11-17 ENCOUNTER — Emergency Department: Payer: Medicare Other

## 2016-11-17 DIAGNOSIS — S8992XA Unspecified injury of left lower leg, initial encounter: Secondary | ICD-10-CM | POA: Diagnosis present

## 2016-11-17 DIAGNOSIS — M79605 Pain in left leg: Secondary | ICD-10-CM | POA: Insufficient documentation

## 2016-11-17 DIAGNOSIS — Y939 Activity, unspecified: Secondary | ICD-10-CM | POA: Diagnosis not present

## 2016-11-17 DIAGNOSIS — W000XXA Fall on same level due to ice and snow, initial encounter: Secondary | ICD-10-CM | POA: Diagnosis not present

## 2016-11-17 DIAGNOSIS — Y999 Unspecified external cause status: Secondary | ICD-10-CM | POA: Diagnosis not present

## 2016-11-17 DIAGNOSIS — Y929 Unspecified place or not applicable: Secondary | ICD-10-CM | POA: Diagnosis not present

## 2016-11-17 DIAGNOSIS — I10 Essential (primary) hypertension: Secondary | ICD-10-CM | POA: Diagnosis not present

## 2016-11-17 DIAGNOSIS — E119 Type 2 diabetes mellitus without complications: Secondary | ICD-10-CM | POA: Diagnosis not present

## 2016-11-17 DIAGNOSIS — Z79899 Other long term (current) drug therapy: Secondary | ICD-10-CM | POA: Diagnosis not present

## 2016-11-17 MED ORDER — HYDROCODONE-ACETAMINOPHEN 5-325 MG PO TABS
1.0000 | ORAL_TABLET | Freq: Once | ORAL | Status: AC
  Administered 2016-11-17: 1 via ORAL
  Filled 2016-11-17: qty 1

## 2016-11-17 NOTE — ED Triage Notes (Signed)
Pt c/o pain to left leg. Is a left BKA. Fell on ice 2/3 and having pain to thigh/hip from fall.  He reports prosthetic went under him when he fell.

## 2016-11-17 NOTE — ED Notes (Signed)
See triage note   States he fell about 3 days ago  His prosthetic leg went under him the his weight went down in it   Having increased pain from knee into left hip area

## 2016-11-17 NOTE — ED Provider Notes (Signed)
[facility] Emergency Department Provider Note ____________________________________________  Time seen: Approximately 4:33 PM  I have reviewed the triage vital signs and the nursing notes.   HISTORY  Chief Complaint Leg Pain    HPI Todd [name] is a 51 y.o. male who presents to the emergency department for evaluation of left leg pain after falling on the ice 3 days ago. He is a left BKA and states that his prosthetic went under him during the fall. Pain is in left knee with radiation into hip.  Past Medical History:  Diagnosis Date  . Anxiety   . Depression   . Diabetes mellitus without complication (HCC)   . GERD (gastroesophageal reflux disease)   . Hypertension   . Sleep apnea     Patient Active Problem List   Diagnosis Date Noted  . Substance induced mood disorder (HCC) 05/19/2016  . Severe recurrent major depression without psychotic features (HCC) 05/19/2016  . Suicidal ideation 05/19/2016  . HTN (hypertension) 05/19/2016  . GERD (gastroesophageal reflux disease) 05/19/2016  . Alcohol use disorder, mild, abuse 05/19/2016    Past Surgical History:  Procedure Laterality Date  . LEG AMPUTATION BELOW KNEE     left  . SHOULDER SURGERY      Prior to Admission medications   Medication Sig Start Date End Date Taking? Authorizing Provider  butalbital-acetaminophen-caffeine (FIORICET) 50-325-40 MG tablet Take 1-2 tablets by mouth every 6 (six) hours as needed for headache. 02/28/16 02/27/17  Minna Antis, MD  lisinopril (PRINIVIL,ZESTRIL) 40 MG tablet Take 1 tablet (40 mg total) by mouth daily. 05/20/16   Shari Prows, MD  metoCLOPramide (REGLAN) 10 MG tablet Take 1 tablet (10 mg total) by mouth 4 (four) times daily -  before meals and at bedtime. 01/02/16   Sharman Cheek, MD  ondansetron (ZOFRAN) 4 MG tablet Take 1 tablet (4 mg total) by mouth daily as needed for nausea or vomiting. 07/30/16   Emily Filbert, MD  ranitidine  (ZANTAC) 150 MG capsule Take 1 capsule (150 mg total) by mouth 2 (two) times daily. 01/02/16   Sharman Cheek, MD  sucralfate (CARAFATE) 1 g tablet Take 1 tablet (1 g total) by mouth 4 (four) times daily. 01/02/16   Sharman Cheek, MD  zolpidem (AMBIEN) 10 MG tablet Take 1 tablet (10 mg total) by mouth at bedtime as needed for sleep. 05/20/16   Shari Prows, MD    Allergies Benadryl [diphenhydramine]  History reviewed. No pertinent family history.  Social History Social History  Substance Use Topics  . Smoking status: Never Smoker  . Smokeless tobacco: Never Used  . Alcohol use No    Review of Systems Constitutional: No recent illness. Cardiovascular: Denies chest pain or palpitations. Respiratory: Denies shortness of breath. Musculoskeletal: Pain in left knee and hip. Skin: Negative for rash, wound, lesion. Neurological: Negative for focal weakness or numbness.  ____________________________________________   PHYSICAL EXAM:  VITAL SIGNS: ED Triage Vitals  Enc Vitals Group     BP 11/17/16 1616 (!) 143/95     Pulse Rate 11/17/16 1616 89     Resp 11/17/16 1616 18     Temp 11/17/16 1616 98.4 F (36.9 C)     Temp Source 11/17/16 1616 Oral     SpO2 11/17/16 1616 96 %     Weight 11/17/16 1615 274 lb (124.3 kg)     Height 11/17/16 1615 5\' 8"  (1.727 m)     Head Circumference --      Peak Flow --  Pain Score 11/17/16 1615 10     Pain Loc --      Pain Edu? --      Excl. in GC? --     Constitutional: Alert and oriented. Well appearing and in no acute distress. Eyes: Conjunctivae are normal. EOMI. Head: Atraumatic. Neck: No stridor.  Respiratory: Normal respiratory effort.   Musculoskeletal: Diffuse tenderness of the left upper leg on the lateral aspect. No obvious deformity. ROM possible but painful. Neurologic:  Normal speech and language. No gross focal neurologic deficits are appreciated. Speech is normal. No gait instability. Skin:  Skin is warm, dry and  intact. Atraumatic. Psychiatric: Mood and affect are normal. Speech and behavior are normal.  ____________________________________________   LABS (all labs ordered are listed, but only abnormal results are displayed)  Labs Reviewed - No data to display ____________________________________________  RADIOLOGY  Left femur image without acute bony abnormality. I, Kem Boroughsari Atiya Yera, personally viewed and evaluated these images (plain radiographs) as part of my medical decision making, as well as reviewing the written report by the radiologist.  ____________________________________________   PROCEDURES  Procedure(s) performed: None   ____________________________________________   INITIAL IMPRESSION / ASSESSMENT AND PLAN / ED COURSE     Pertinent labs & imaging results that were available during my care of the patient were reviewed by me and considered in my medical decision making (see chart for details).  51 year old male presenting to the emergency department for evaluation of left leg pain. No acute bony abnormality is obvious on x-ray. He is to follow-up with his orthopedist for symptoms that are not improving over the week or return to the emergency department for symptoms that change or worsen. ____________________________________________   FINAL CLINICAL IMPRESSION(S) / ED DIAGNOSES  Final diagnoses:  Left leg pain       Chinita PesterCari B Amerika Nourse, FNP 11/17/16 1807    Myrna Blazeravid Matthew Schaevitz, MD 11/17/16 2358

## 2016-11-17 NOTE — ED Notes (Signed)
Pt returned from xray via stretcher.

## 2016-11-17 NOTE — Discharge Instructions (Signed)
Follow up with your orthopedist if your symptoms are not resolving over the week. Return to the ER for symptoms that change or worsen if unable to schedule an appointment.

## 2016-12-03 ENCOUNTER — Ambulatory Visit: Payer: Medicare Other | Attending: Orthopedic Surgery | Admitting: Physical Therapy

## 2016-12-03 DIAGNOSIS — Z9889 Other specified postprocedural states: Secondary | ICD-10-CM | POA: Diagnosis present

## 2016-12-03 DIAGNOSIS — G8929 Other chronic pain: Secondary | ICD-10-CM | POA: Diagnosis present

## 2016-12-03 DIAGNOSIS — M25512 Pain in left shoulder: Secondary | ICD-10-CM | POA: Diagnosis present

## 2016-12-03 NOTE — Therapy (Signed)
[facility] [address] [place], [address] Phone: [phone]   Fax:  [phone]  Physical Therapy Evaluation  Patient Details  Name: Todd [name] MRN: [id number] Date of Birth: 02/05/1966 Referring Provider: Dr. Ivin Poot  Encounter Date: 12/03/2016      PT End of Session - 12/03/16 1355    Visit Number 1   Number of Visits 25   Date for PT Re-Evaluation 03/25/17   PT Start Time 1303   PT Stop Time 1345   PT Time Calculation (min) 42 min   Activity Tolerance Patient tolerated treatment well   Behavior During Therapy [facility] for tasks assessed/performed      Past Medical History:  Diagnosis Date  . Anxiety   . Depression   . Diabetes mellitus without complication (HCC)   . GERD (gastroesophageal reflux disease)   . Hypertension   . Sleep apnea     Past Surgical History:  Procedure Laterality Date  . LEG AMPUTATION BELOW KNEE     left  . SHOULDER SURGERY      There were no vitals filed for this visit.       Subjective Assessment - 12/03/16 1349    Subjective Patient presents s/p L RTC repair with acriomion resection on 11/18/2016. No biceps pathology noted in op report. He reports he is having pain around his neck, anterior shoulder, and biceps. He has been compliant with his precautions and keeping his arm in the sling.    Limitations Lifting;House hold activities   Patient Stated Goals To increase his ROM and return to weightlifting.    Currently in Pain? Yes   Pain Score --  Does not rate, but reports moderate to severe pain in L shoulder and biceps primarily    Pain Location Arm   Pain Orientation Left   Pain Descriptors / Indicators Aching;Operative site guarding   Pain Type Surgical pain   Pain Onset 1 to 4 weeks ago   Pain Frequency Constant   Aggravating Factors  Repositioning    Pain Relieving Factors In sling            [facility] PT Assessment - 12/03/16 1412      Assessment   Medical Diagnosis L RTC repair   Referring Provider Dr. Ivin Poot   Onset Date/Surgical Date 11/18/16   Hand Dominance Right     Precautions   Precautions Shoulder   Type of Shoulder Precautions --  NWB, No AROM for 6-8 weeks, AAROM at 4-6 weeks.    Precaution Comments --  Sling for first 4 weeks   Required Braces or Orthoses Sling     Restrictions   Weight Bearing Restrictions Yes   LUE Weight Bearing Non weight bearing     Balance Screen   Has the patient fallen in the past 6 months Yes   How many times? 1     Prior Function   Level of Independence Independent   Vocation On disability   Leisure Likes to workout and weightlift     Cognition   Overall Cognitive Status Within Functional Limits for tasks assessed     Sensation   Light Touch Appears Intact     PROM   Left Shoulder Flexion 46 Degrees   Left Shoulder Internal Rotation 70 Degrees   Left Shoulder External Rotation 31 Degrees   Right/Left Elbow --  WNL   Right/Left Wrist --  WNL       Soft tissue mobilization to  distal biceps on L side, notable tender areas noted which he reports is less painful after several minutes.   PROM in supine to shoulder flexion with elbow flexed in scapular plane x 5 minutes on first bout, 3 minutes on second bout after STM to biceps (tolerated increased ROM)  PROM to external rotation x 5 minutes x 2 bouts increased ROM to 39 degrees   AROM assessed of hand, wrist, elbow all WNL bilaterally   Sutures with steri-strip in place, no redness or fluid noted.                      PT Education - 12/30/2016 1354    Education provided Yes   Education Details PROM only follow up x 2 next week, maintain in sling at all times.    Person(s) Educated Patient   Methods Demonstration;Explanation   Comprehension Verbalized understanding;Returned demonstration             PT Long Term Goals - 2016-12-30 1403      PT LONG TERM GOAL #1   Title Patient will demonstrate at  least 150 degrees of active shoulder flexion in 10 weeks to complete ADLs.    Baseline 46 degree flexion PROM   Time 4   Period Weeks     PT LONG TERM GOAL #2   Title Patient will report a QuickDASH score of 30% disability in 10 weeks to demonstrate improved tolerance for ADLs.    Baseline 100% disability   Time 4   Period Weeks   Status New     PT LONG TERM GOAL #3   Title Patient will be able to lift 10# overhead with increase in pain of no more than 1/10 to demonstrate improved tolerance for ADLs.    Baseline Unable                Plan - 12-30-2016 1355    Clinical Impression Statement Patient is roughly 2 weeks s/p L RTC repair with acromion reduction. Patient demonstrates adequate PROM for this stage of PT, deficits primarily in flexion and external rotation. He has been compliant with precautions at this point. Pt will continue with PROM exercises in flexion, abduction, ER, and IR. Education on how to complete PROM exercises at home was given. Patient reports pain in neck, anterior shoulder, and biceps, however all within normal ranges, and reduced with PROM and soft tissue mobilization. Patient will benefit from skilled PT services to address deficits in  ROM, strength, and function to allow return to ADLs and recreational activitities.     Rehab Potential Good   PT Frequency 2x / week   PT Duration 12 weeks   PT Treatment/Interventions Passive range of motion;Therapeutic exercise;Therapeutic activities;Balance training;Taping;Dry needling;Manual techniques;Ultrasound;Moist Heat;Iontophoresis 4mg /ml Dexamethasone;Cryotherapy;Electrical Stimulation;ADLs/Self Care Home Management   PT Next Visit Plan Continue with PROM per protocol.    PT Home Exercise Plan PROM in Flexion, abduction, internal rotation and external rotation.   Consulted and Agree with Plan of Care Patient      Patient will benefit from skilled therapeutic intervention in order to improve the following deficits  and impairments:  Pain, Decreased range of motion, Impaired UE functional use, Decreased strength  Visit Diagnosis: Chronic left shoulder pain - Plan: PT plan of care cert/re-cert  S/P left rotator cuff repair - Plan: PT plan of care cert/re-cert      G-Codes - 12-30-2016 1410    Functional Assessment Tool Used (Outpatient Only) Precautions  Functional Limitation Carrying, moving and handling objects   Carrying, Moving and Handling Objects Current Status [phone]([id number]) 100 percent impaired, limited or restricted   Carrying, Moving and Handling Objects Goal Status ([id number]([id number]) At least 1 percent but less than 20 percent impaired, limited or restricted       Problem List Patient Active Problem List   Diagnosis Date Noted  . Substance induced mood disorder (HCC) 05/19/2016  . Severe recurrent major depression without psychotic features (HCC) 05/19/2016  . Suicidal ideation 05/19/2016  . HTN (hypertension) 05/19/2016  . GERD (gastroesophageal reflux disease) 05/19/2016  . Alcohol use disorder, mild, abuse 05/19/2016   Alva GarnetPatrick Jennett Tarbell PT, DPT, CSCS    12/03/2016, 2:23 PM  [facility] [address] [place], [place], [address] Phone: [phone]   Fax:  [phone]  Name: [name] Konz MRN: [id number] Date of Birth: 12/31/1965

## 2016-12-07 ENCOUNTER — Ambulatory Visit: Payer: Medicare Other | Admitting: Physical Therapy

## 2016-12-07 DIAGNOSIS — M25512 Pain in left shoulder: Secondary | ICD-10-CM | POA: Diagnosis not present

## 2016-12-07 DIAGNOSIS — Z9889 Other specified postprocedural states: Secondary | ICD-10-CM

## 2016-12-07 DIAGNOSIS — G8929 Other chronic pain: Secondary | ICD-10-CM

## 2016-12-08 NOTE — Therapy (Signed)
[facility] [address] [place], [place], [address] Phone: [phone]   Fax:  [phone]  Physical Therapy Treatment  Patient Details  Name: [name] Gist MRN: [id number] Date of Birth: 05/12/1966 Referring Provider: Dr. Ivin PootSpang  Encounter Date: 12/07/2016      PT End of Session - 12/07/16 1709    Visit Number 2   Number of Visits 25   Date for PT Re-Evaluation 03/25/17   PT Start Time 1648   PT Stop Time 1730   PT Time Calculation (min) 42 min   Activity Tolerance Patient tolerated treatment well   Behavior [facility] Therapy [facility] for tasks assessed/performed      Past Medical History:  Diagnosis Date  . Anxiety   . Depression   . Diabetes mellitus without complication (HCC)   . GERD (gastroesophageal reflux disease)   . Hypertension   . Sleep apnea     Past Surgical History:  Procedure Laterality Date  . LEG AMPUTATION BELOW KNEE     left  . SHOULDER SURGERY      There were no vitals filed for this visit.      Subjective Assessment - 12/07/16 1652    Subjective Patient reports he is having slightly less pain this date. He has been compliant with wearing brace.   Limitations Lifting;House hold activities   Patient Stated Goals To increase his ROM and return to weightlifting.    Currently in Pain? Yes  Patient reports pain is generally more intense than he thought, though stretching it at PT and increasing his PROM has been beneficial.    Pain Score --  Moderate pain in anterior shoulder.    Pain Location Arm   Pain Orientation Left   Pain Descriptors / Indicators Aching;Operative site guarding   Pain Type Surgical pain   Pain Onset 1 to 4 weeks ago   Pain Frequency Intermittent      Multiple bouts of PROM to ER with patient in supine, initially limited by pain however as patient cued for decreased guarding he was able to tolerate increased stretch ROM, maximal measured in supine was 59 degrees.  Performed roughly 4 bouts of 3-5 minutes per bout with rest breaks provided in between   Passive forward elevation stretching provided, again initially limited by pain, which subsided with increased passive elevation (required cuing again for decreased guarding). After 4 bouts x 4-5 minutes patient able to tolerate roughly 85-100 degrees of passive forward elevation prior to needing lowering secondary to pain.   Discussed with patient that it is normal to have pain, particularly with eccentric lowering, progression of rehab per his protocol from surgeon. Continue to use as little as possible, educated to not IR at the shoulder with sling off as he has been doing this to pull up pants.                            PT Education - 12/07/16 1708    Education provided Yes   Education Details Will continue to maintain PROM.    Person(s) Educated Patient   Methods Explanation;Demonstration   Comprehension Verbalized understanding;Returned demonstration             PT Long Term Goals - 12/03/16 1403      PT LONG TERM GOAL #1   Title Patient will demonstrate at least 150 degrees of active shoulder flexion in 10 weeks to complete ADLs.  Baseline 46 degree flexion PROM   Time 4   Period Weeks     PT LONG TERM GOAL #2   Title Patient will report a QuickDASH score of 30% disability in 10 weeks to demonstrate improved tolerance for ADLs.    Baseline 100% disability   Time 4   Period Weeks   Status New     PT LONG TERM GOAL #3   Title Patient will be able to lift 10# overhead with increase in pain of no more than 1/10 to demonstrate improved tolerance for ADLs.    Baseline Unable                Plan - 12/08/16 1610    Clinical Impression Statement Patient demonstrated excellent improvement with ER PROM and PROM into shoulder elevation in this session. He has now exceeded his goals for this stage of rehabilitation for ER and IR PROM.He is still limited with  forward flexion PROM, though again is able to improve to between 85-100 degrees of elevation this session before pain onset.    Rehab Potential Good   PT Frequency 2x / week   PT Duration 12 weeks   PT Treatment/Interventions Passive range of motion;Therapeutic exercise;Therapeutic activities;Balance training;Taping;Dry needling;Manual techniques;Ultrasound;Moist Heat;Iontophoresis 4mg /ml Dexamethasone;Cryotherapy;Electrical Stimulation;ADLs/Self Care Home Management   PT Next Visit Plan Continue with PROM per protocol.    PT Home Exercise Plan PROM in Flexion, abduction, internal rotation and external rotation.   Consulted and Agree with Plan of Care Patient      Patient will benefit from skilled therapeutic intervention in order to improve the following deficits and impairments:  Pain, Decreased range of motion, Impaired UE functional use, Decreased strength  Visit Diagnosis: Chronic left shoulder pain  S/P left rotator cuff repair     Problem List Patient Active Problem List   Diagnosis Date Noted  . Substance induced mood disorder (HCC) 05/19/2016  . Severe recurrent major depression without psychotic features (HCC) 05/19/2016  . Suicidal ideation 05/19/2016  . HTN (hypertension) 05/19/2016  . GERD (gastroesophageal reflux disease) 05/19/2016  . Alcohol use disorder, mild, abuse 05/19/2016   Alva Garnet PT, DPT, CSCS    12/08/2016, 9:27 AM  [facility] SPORTS MEDICINE [address], [place], [address] Phone: [phone]   Fax:  [phone]  Name: Teja Judice MRN: [id number] Date of Birth: 08-Jan-1966

## 2016-12-09 ENCOUNTER — Ambulatory Visit: Payer: Medicare Other | Admitting: Physical Therapy

## 2016-12-09 DIAGNOSIS — G8929 Other chronic pain: Secondary | ICD-10-CM

## 2016-12-09 DIAGNOSIS — Z9889 Other specified postprocedural states: Secondary | ICD-10-CM

## 2016-12-09 DIAGNOSIS — M25512 Pain in left shoulder: Principal | ICD-10-CM

## 2016-12-09 NOTE — Therapy (Signed)
[place] [facility] [address] [place], [address] Phone: [phone]   Fax:  [phone]  Physical Therapy Treatment  Patient Details  Name: Todd [name] MRN: [id number] Date of Birth: 05/05/1966 Referring Provider: Dr. Alvin Critchley  Encounter Date: 12/09/2016      PT End of Session - 12/09/16 1008    Visit Number 3   Number of Visits 25   Date for PT Re-Evaluation 03/25/17   PT Start Time 0952   PT Stop Time 1025   PT Time Calculation (min) 33 min   Activity Tolerance Patient tolerated treatment well   Behavior During Therapy [facility] for tasks assessed/performed      Past Medical History:  Diagnosis Date  . Anxiety   . Depression   . Diabetes mellitus without complication ([place])   . GERD (gastroesophageal reflux disease)   . Hypertension   . Sleep apnea     Past Surgical History:  Procedure Laterality Date  . LEG AMPUTATION BELOW KNEE     left  . SHOULDER SURGERY      There were no vitals filed for this visit.      Subjective Assessment - 12/09/16 0955    Subjective Patient reports he is having less pain the past few days, though continues to have difficulty with sleeping. Has contacted his MD about medicaiton changes.    Limitations Lifting;House hold activities   Patient Stated Goals To increase his ROM and return to weightlifting.    Currently in Pain? Yes   Pain Descriptors / Indicators Aching;Operative site guarding   Pain Type Chronic pain;Surgical pain   Pain Onset 1 to 4 weeks ago   Pain Frequency Constant      Elbow flexion/extension actively - some pain in proximal biceps area, none otherwise full AROM  PROM to ER x 2 bouts -- to roughly 60 degrees for roughly 5 minutes (reduced pain throughout, cuing to relax his musculature.)   PROM into shoulder elevation to ~140 degrees x 2 bouts x 8 minutes -- (reduced pain throughout, cuing to relax his musculature.)   Codman's exercises  forward/lateral/rotational without pain, appropriate technique                             PT Education - 12/09/16 1013    Education provided Yes   Education Details PROM is steadily progressing and is right where it should be.    Person(s) Educated Patient   Methods Explanation   Comprehension Verbalized understanding             PT Long Term Goals - 12/03/16 1403      PT LONG TERM GOAL #1   Title Patient will demonstrate at least 150 degrees of active shoulder flexion in 10 weeks to complete ADLs.    Baseline 46 degree flexion PROM   Time 4   Period Weeks     PT LONG TERM GOAL #2   Title Patient will report a QuickDASH score of 30% disability in 10 weeks to demonstrate improved tolerance for ADLs.    Baseline 100% disability   Time 4   Period Weeks   Status New     PT LONG TERM GOAL #3   Title Patient will be able to lift 10# overhead with increase in pain of no more than 1/10 to demonstrate improved tolerance for ADLs.    Baseline Unable  Plan - 12/09/16 1009    Clinical Impression Statement Patient has now met PROM goals for IR, ER, forward elevation provided in rehab protocol by MD, pain is decreasing as well. He was able to perform Codman's exercises with no complaints and appropriate technique as well.    Rehab Potential Good   PT Frequency 2x / week   PT Duration 12 weeks   PT Treatment/Interventions Passive range of motion;Therapeutic exercise;Therapeutic activities;Balance training;Taping;Dry needling;Manual techniques;Ultrasound;Moist Heat;Iontophoresis 36m/ml Dexamethasone;Cryotherapy;Electrical Stimulation;ADLs/Self Care Home Management   PT Next Visit Plan Continue with PROM per protocol.    PT Home Exercise Plan PROM in Flexion, abduction, internal rotation and external rotation.   Consulted and Agree with Plan of Care Patient      Patient will benefit from skilled therapeutic intervention in order to improve  the following deficits and impairments:  Pain, Decreased range of motion, Impaired UE functional use, Decreased strength  Visit Diagnosis: Chronic left shoulder pain  S/P left rotator cuff repair     Problem List Patient Active Problem List   Diagnosis Date Noted  . Substance induced mood disorder ([place] 05/19/2016  . Severe recurrent major depression without psychotic features ([place] 05/19/2016  . Suicidal ideation 05/19/2016  . HTN (hypertension) 05/19/2016  . GERD (gastroesophageal reflux disease) 05/19/2016  . Alcohol use disorder, mild, abuse 05/19/2016   PRoyce MacadamiaPT, DPT, CSCS     12/09/2016, 10:32 AM  [facility] [address] [place] [address]Phone: [phone]  Fax:  [phone] Name: ABlas RichesMRN: [id number] of Birth: [birth year]

## 2016-12-09 NOTE — Patient Instructions (Signed)
Elbow flexion/extension actively - some pain in proximal biceps area, none otherwise full AROM  PROM to ER x 2 bouts -- to roughly 60 degrees   PROM into shoulder elevation to ~140 degrees   Codman's exercises

## 2016-12-15 ENCOUNTER — Ambulatory Visit: Payer: Medicare Other | Attending: Orthopedic Surgery | Admitting: Physical Therapy

## 2016-12-15 DIAGNOSIS — M25512 Pain in left shoulder: Secondary | ICD-10-CM | POA: Insufficient documentation

## 2016-12-15 DIAGNOSIS — Z9889 Other specified postprocedural states: Secondary | ICD-10-CM | POA: Insufficient documentation

## 2016-12-15 DIAGNOSIS — G8929 Other chronic pain: Secondary | ICD-10-CM | POA: Insufficient documentation

## 2016-12-15 NOTE — Therapy (Signed)
[place] PHYSICAL AND SPORTS MEDICINE [address] [place], [address] Phone: [phone]   Fax:  [phone]  Physical Therapy Treatment  Patient Details  Name: Todd [name] MRN: [id number] Date of Birth: [birth year] Referring Provider: Dr. Alvin Critchley  Encounter Date: 12/15/2016      PT End of Session - 12/15/16 1047    Visit Number 4   Number of Visits 25   Date for PT Re-Evaluation 03/25/17   PT Start Time 1033   PT Stop Time 1059   PT Time Calculation (min) 26 min   Activity Tolerance Patient tolerated treatment well   Behavior During Therapy [facility] for tasks assessed/performed      Past Medical History:  Diagnosis Date  . Anxiety   . Depression   . Diabetes mellitus without complication ([place])   . GERD (gastroesophageal reflux disease)   . Hypertension   . Sleep apnea     Past Surgical History:  Procedure Laterality Date  . LEG AMPUTATION BELOW KNEE     left  . SHOULDER SURGERY      There were no vitals filed for this visit.      Subjective Assessment - 12/15/16 1035    Subjective Patient reports his shoulder is feeling better, it continues to be painful but less so.    Limitations Lifting;House hold activities   Patient Stated Goals To increase his ROM and return to weightlifting.    Currently in Pain? Yes   Pain Onset 1 to 4 weeks ago      PROM ER - multiple bouts with oscillations at end range (initially painful/uncomfortable) with repeated oscillations, this reduced and his PROM increased to roughly 70 degrees with firm end feel.   PROM of IR assessed- WNL   PROM - ER at side ~ 30-40 degrees -- performed oscillations at end range x 2 minutes (well tolerated)   PROM -- into shoulder flexion to roughly 100-110 degrees, with multiple bouts of PROM he was able to achieve 140-160 degrees of forward elevation, with reduced levels of pain reported.                             PT Education -  12/15/16 1051    Education provided Yes   Education Details Will progress to [facility] in next session and discontinue use of sling   Person(s) Educated Patient   Methods Explanation   Comprehension Verbalized understanding             PT Long Term Goals - 12/03/16 1403      PT LONG TERM GOAL #1   Title Patient will demonstrate at least 150 degrees of active shoulder flexion in 10 weeks to complete ADLs.    Baseline 46 degree flexion PROM   Time 4   Period Weeks     PT LONG TERM GOAL #2   Title Patient will report a QuickDASH score of 30% disability in 10 weeks to demonstrate improved tolerance for ADLs.    Baseline 100% disability   Time 4   Period Weeks   Status New     PT LONG TERM GOAL #3   Title Patient will be able to lift 10# overhead with increase in pain of no more than 1/10 to demonstrate improved tolerance for ADLs.    Baseline Unable                Plan - 12/15/16 1047  Clinical Impression Statement Patient has now met all Phase 1 PROM goals and reports decreased pain (minimal pain) with end range stretching throughout this session. He has progressed well and will be appropriate for advancement to phase 2 during next session.    Rehab Potential Good   PT Frequency 2x / week   PT Duration 12 weeks   PT Treatment/Interventions Passive range of motion;Therapeutic exercise;Therapeutic activities;Balance training;Taping;Dry needling;Manual techniques;Ultrasound;Moist Heat;Iontophoresis 24m/ml Dexamethasone;Cryotherapy;Electrical Stimulation;ADLs/Self Care Home Management   PT Next Visit Plan Continue with PROM per protocol.    PT Home Exercise Plan PROM in Flexion, abduction, internal rotation and external rotation.   Consulted and Agree with Plan of Care Patient      Patient will benefit from skilled therapeutic intervention in order to improve the following deficits and impairments:  Pain, Decreased range of motion, Impaired UE functional use, Decreased  strength  Visit Diagnosis: Chronic left shoulder pain  S/P left rotator cuff repair     Problem List Patient Active Problem List   Diagnosis Date Noted  . Substance induced mood disorder ([place] 05/19/2016  . Severe recurrent major depression without psychotic features ([place] 05/19/2016  . Suicidal ideation 05/19/2016  . HTN (hypertension) 05/19/2016  . GERD (gastroesophageal reflux disease) 05/19/2016  . Alcohol use disorder, mild, abuse 05/19/2016   PRoyce MacadamiaPT, DPT, CSCS    12/15/2016, 11:46 AM  [facility] [address] [place] [address]Phone: [phone]  Fax:  [phone] Name: Todd GueyeMRN: [id number] of Birth: 207/28/1967

## 2016-12-15 NOTE — Patient Instructions (Addendum)
PROM ER - multiple bouts with oscillations at end range   Prom Ir- wnl  PROM - ER at side ~ 30-40 degrees   PROM -- into shoulder flexion to roughly 100-110 degrees

## 2016-12-17 ENCOUNTER — Ambulatory Visit: Payer: Medicare Other | Admitting: Physical Therapy

## 2016-12-17 DIAGNOSIS — M25512 Pain in left shoulder: Principal | ICD-10-CM

## 2016-12-17 DIAGNOSIS — Z9889 Other specified postprocedural states: Secondary | ICD-10-CM

## 2016-12-17 DIAGNOSIS — G8929 Other chronic pain: Secondary | ICD-10-CM

## 2016-12-17 NOTE — Therapy (Signed)
[place] [facility] [address], [place], [address] Phone: [phone]   Fax:  [phone]  Physical Therapy Treatment  Patient Details  Name: Todd [name] MRN: [id number] Date of Birth: 1966/08/13 Referring Provider: Dr. Ivin Poot  Encounter Date: 12/17/2016      PT End of Session - 12/17/16 1528    Visit Number 5   Number of Visits 25   Date for PT Re-Evaluation 03/25/17   PT Start Time 1511   PT Stop Time 1550   PT Time Calculation (min) 39 min   Activity Tolerance Patient tolerated treatment well   Behavior During Therapy [facility] for tasks assessed/performed      Past Medical History:  Diagnosis Date  . Anxiety   . Depression   . Diabetes mellitus without complication (HCC)   . GERD (gastroesophageal reflux disease)   . Hypertension   . Sleep apnea     Past Surgical History:  Procedure Laterality Date  . LEG AMPUTATION BELOW KNEE     left  . SHOULDER SURGERY      There were no vitals filed for this visit.      Subjective Assessment - 12/17/16 1520    Subjective Patient presents without sling (per protocol can be dc'd 4 weeks post op). He reports he has some pain around clavicle still at times.    Limitations Lifting;House hold activities   Patient Stated Goals To increase his ROM and return to weightlifting.    Currently in Pain? Yes   Pain Location Arm   Pain Orientation Left   Pain Descriptors / Indicators Aching;Operative site guarding;Sore   Pain Type Surgical pain;Chronic pain   Pain Onset 1 to 4 weeks ago   Pain Frequency Constant   Pain Relieving Factors Increasing ROM      AAROM ER sitting with PVC pipe 2 bouts x 12 repetitions with towel roll underneath his elbow (well tolerated)  PROM ER in Supine -- initially quite limited, with prolonged time stretching able to achieve 70-80 degrees of ER with minimal to no discomfort/pain   PROM flexion -- patient reports high levels of pain  during eccentric portion, reports a pop but no increased pain. After 1 minute of oscillations he reports significant decrease in pain level, able to achieve roughly 130-140 degrees of passive elevation.   AAROM flexion with PVC pipe in supine - 2 bouts with pain initially reported, however he was able to elevate his LUE to roughly 130-140 degrees on second bout with very mild report of pain at end range, cuing to use RUE to perform the bulk of the lifting process                            PT Education - 12/17/16 1527    Education provided Yes   Education Details AAROM exercises for HEP.    Person(s) Educated Patient   Methods Explanation;Demonstration;Handout   Comprehension Verbalized understanding;Returned demonstration             PT Long Term Goals - 12/03/16 1403      PT LONG TERM GOAL #1   Title Patient will demonstrate at least 150 degrees of active shoulder flexion in 10 weeks to complete ADLs.    Baseline 46 degree flexion PROM   Time 4   Period Weeks     PT LONG TERM GOAL #2   Title Patient will report a QuickDASH score of  30% disability in 10 weeks to demonstrate improved tolerance for ADLs.    Baseline 100% disability   Time 4   Period Weeks   Status New     PT LONG TERM GOAL #3   Title Patient will be able to lift 10# overhead with increase in pain of no more than 1/10 to demonstrate improved tolerance for ADLs.    Baseline Unable                Plan - 12/17/16 1528    Clinical Impression Statement Patient has [facility] to [facility] activities, he is able to tolerate near full flexion with AAROM, near full ER at 0 degrees of abduction. He has maintained excellent PROM in flexion and ER and has had reduction in pain with each session.    Rehab Potential Good   PT Frequency 2x / week   PT Duration 12 weeks   PT Treatment/Interventions Passive range of motion;Therapeutic exercise;Therapeutic activities;Balance training;Taping;Dry  needling;Manual techniques;Ultrasound;Moist Heat;Iontophoresis 4mg /ml Dexamethasone;Cryotherapy;Electrical Stimulation;ADLs/Self Care Home Management   PT Next Visit Plan Continue with PROM per protocol.    PT Home Exercise Plan PROM in Flexion, abduction, internal rotation and external rotation.   Consulted and Agree with Plan of Care Patient      Patient will benefit from skilled therapeutic intervention in order to improve the following deficits and impairments:  Pain, Decreased range of motion, Impaired UE functional use, Decreased strength  Visit Diagnosis: Chronic left shoulder pain  S/P left rotator cuff repair     Problem List Patient Active Problem List   Diagnosis Date Noted  . Substance induced mood disorder (HCC) 05/19/2016  . Severe recurrent major depression without psychotic features (HCC) 05/19/2016  . Suicidal ideation 05/19/2016  . HTN (hypertension) 05/19/2016  . GERD (gastroesophageal reflux disease) 05/19/2016  . Alcohol use disorder, mild, abuse 05/19/2016   Alva GarnetPatrick McNamara PT, DPT, CSCS    12/17/2016, 3:55 PM  [place] [facility] [address] [place], [place], [address] Phone: [phone]   Fax:  [phone]  Name: Todd [name] MRN: [id number] Date of Birth: 07/08/1966

## 2016-12-17 NOTE — Patient Instructions (Signed)
AAROM ER sitting     PROM ER in Supine   PROM flexion

## 2016-12-22 ENCOUNTER — Ambulatory Visit: Payer: Medicare Other | Admitting: Physical Therapy

## 2016-12-22 DIAGNOSIS — M25512 Pain in left shoulder: Principal | ICD-10-CM

## 2016-12-22 DIAGNOSIS — Z9889 Other specified postprocedural states: Secondary | ICD-10-CM

## 2016-12-22 DIAGNOSIS — G8929 Other chronic pain: Secondary | ICD-10-CM

## 2016-12-22 NOTE — Therapy (Addendum)
[place] [facility] [address] [place], [place], [address] Phone: [phone]   Fax:  [phone]  Physical Therapy Treatment  Patient Details  Name: Todd [name] MRN: [id number] Date of Birth: 06/25/1966 Referring Provider: Dr. Ivin PootSpang  Encounter Date: 12/22/2016      PT End of Session - 12/22/16 1322    Visit Number 6   Number of Visits 25   Date for PT Re-Evaluation 03/25/17   PT Start Time 1301   PT Stop Time 1339   PT Time Calculation (min) 38 min   Activity Tolerance Patient tolerated treatment well   Behavior [facility] for tasks assessed/performed      Past Medical History:  Diagnosis Date  . Anxiety   . Depression   . Diabetes mellitus without complication (HCC)   . GERD (gastroesophageal reflux disease)   . Hypertension   . Sleep apnea     Past Surgical History:  Procedure Laterality Date  . LEG AMPUTATION BELOW KNEE     left  . SHOULDER SURGERY      There were no vitals filed for this visit.      Subjective Assessment - 12/22/16 1302    Subjective Patient reports he keeps feeling better and better, reports he has been doing his HEP multiple times per day, he has some pain but is dissipating.    Limitations Lifting;House hold activities   Patient Stated Goals To increase his ROM and return to weightlifting.    Currently in Pain? Other (Comment)  No pain at rest, mild to moderate pain with AAROM/PROM      AAROM - ER with dowel x 10 for 2 sets (cuing to push through RUE moreso to increase ER in LUE, which he reported was beneficial)  AAROM flexion with elbows bent with dowel in sitting x 10 for 2 sets (cued to go through ROM to pain level of no more than 4 (roughly 50-60 degrees of flexion).   ER PROM - to 55 degrees on this date (performed 2 bouts of stretching, improved to 60-65 degrees on second bout, not painful and progressive increase in ROM noted   PROM into flexion in supine  -- to second pillow (farthest he has tolerated) with no pain (roughly 140-160 degrees)   Supine AAROM into flexion -- with elbows bent through full available range (roughly 130 degrees, pain reported but mild) 2 sets x 6-8 repetitions   Supine AAROM into flexion with elbow extended from 0 degrees to ~ 80-90 degrees flexion 2 sets x 5-6 repetitions                            PT Education - 12/22/16 1322    Education provided Yes   Education Details Emphasize ER PROM at home    Person(s) Educated Patient   Methods Demonstration;Explanation;Handout   Comprehension Verbalized understanding;Returned demonstration             PT Long Term Goals - 12/03/16 1403      PT LONG TERM GOAL #1   Title Patient will demonstrate at least 150 degrees of active shoulder flexion in 10 weeks to complete ADLs.    Baseline 46 degree flexion PROM   Time 4   Period Weeks     PT LONG TERM GOAL #2   Title Patient will report a QuickDASH score of 30% disability in 10 weeks to demonstrate improved tolerance for ADLs.  Baseline 100% disability   Time 4   Period Weeks   Status New     PT LONG TERM GOAL #3   Title Patient will be able to lift 10# overhead with increase in pain of no more than 1/10 to demonstrate improved tolerance for ADLs.    Baseline Unable                Plan - 12/22/16 1322    Clinical Impression Statement Patient has slight decrease initially with PROM ER, otherwise PROM flexion, and AAROM into flexion both in sitting and supine both slightly increased from previous session. He demonstrates global L shoulder musculature weakness and fatigue, however he is improving in ROM and repetitions tolerated.    Rehab Potential Good   PT Frequency 2x / week   PT Duration 12 weeks   PT Treatment/Interventions Passive range of motion;Therapeutic exercise;Therapeutic activities;Balance training;Taping;Dry needling;Manual techniques;Ultrasound;Moist  Heat;Iontophoresis 4mg /ml Dexamethasone;Cryotherapy;Electrical Stimulation;ADLs/Self Care Home Management   PT Next Visit Plan Continue with PROM per protocol.    PT Home Exercise Plan PROM in Flexion, abduction, internal rotation and external rotation.   Consulted and Agree with Plan of Care Patient      Patient will benefit from skilled therapeutic intervention in order to improve the following deficits and impairments:  Pain, Decreased range of motion, Impaired UE functional use, Decreased strength  Visit Diagnosis: Chronic left shoulder pain  S/P left rotator cuff repair     Problem List Patient Active Problem List   Diagnosis Date Noted  . Substance induced mood disorder (HCC) 05/19/2016  . Severe recurrent major depression without psychotic features (HCC) 05/19/2016  . Suicidal ideation 05/19/2016  . HTN (hypertension) 05/19/2016  . GERD (gastroesophageal reflux disease) 05/19/2016  . Alcohol use disorder, mild, abuse 05/19/2016   Alva Garnet PT, DPT, CSCS    12/22/2016, 2:47 PM  [place] [facility] SP[facility]TS [facility]IC[facility] [address] [place], [address] Phone: [phone]   Fax:  [phone]  Name: Todd [name] MRN: [id number] Date of Birth: Sep 15, 1966

## 2016-12-22 NOTE — Patient Instructions (Addendum)
AAROM - ER with dowel   AAROM flexion with elbows bent with dowel   ER PROM - to 55 degrees on this date   PROM into flexion in supine   Supine AAROM into flexion -- with elbows bent through full available range   Supine AAROM into flexion with elbow extended from 0 degrees to ~ 80-90 degrees flexion

## 2016-12-24 ENCOUNTER — Ambulatory Visit: Payer: Medicare Other | Admitting: Physical Therapy

## 2016-12-24 DIAGNOSIS — Z9889 Other specified postprocedural states: Secondary | ICD-10-CM

## 2016-12-24 DIAGNOSIS — M25512 Pain in left shoulder: Secondary | ICD-10-CM | POA: Diagnosis not present

## 2016-12-24 DIAGNOSIS — G8929 Other chronic pain: Secondary | ICD-10-CM

## 2016-12-24 NOTE — Patient Instructions (Addendum)
PROM - LUE ER --- x 10 minutes  Supine AAROM flexion with elbows bent and PVC pipe   Soft tissue mobilization to biceps   Seated ER AAROM with PVC   Ranger to 170 degrees of motion

## 2016-12-24 NOTE — Therapy (Signed)
[place] [facility] [address] [place], [address] Phone: [phone]   Fax:  [phone]  Physical Therapy Treatment  Patient Details  Name: Todd [name] MRN: [id number] Date of Birth: [birth year] Referring Provider: Dr. Ivin Poot  Encounter Date: 12/24/2016      PT End of Session - 12/24/16 0958    Visit Number 7   Number of Visits 25   Date for PT Re-Evaluation 03/25/17   PT Start Time 0951   PT Stop Time 1030   PT Time Calculation (min) 39 min   Activity Tolerance Patient tolerated treatment well   Behavior During Therapy [facility] for tasks assessed/performed      Past Medical History:  Diagnosis Date  . Anxiety   . Depression   . Diabetes mellitus without complication (HCC)   . GERD (gastroesophageal reflux disease)   . Hypertension   . Sleep apnea     Past Surgical History:  Procedure Laterality Date  . LEG AMPUTATION BELOW KNEE     left  . SHOULDER SURGERY      There were no vitals filed for this visit.      Subjective Assessment - 12/24/16 0955    Subjective Patient reports he is still "not able to do anything" with his LUE. He reports it continues to be sore, but is less so than initially after surgery.    Limitations Lifting;House hold activities   Patient Stated Goals To increase his ROM and return to weightlifting.    Currently in Pain? Other (Comment)  Reports stiffness/ache in the LUE, relieved with stretching/PROM of shoulder, exacerbated by more AAROM or AROM.       PROM - LUE ER --- x 10 minutes -- limited to roughly 50-60 degrees this date with guarding noted, improved with repetitive stretching though noted to be painful   Supine AAROM flexion with elbows bent and PVC pipe -- able to achieve roughly 90-100 degrees of motion this date, again painful initially, though with multiple repetitions he is able to achieve slight increase in ROM with decreased pain reported  Soft tissue  mobilization to biceps -- notable for a trigger/tender point which was initially quite uncomfortable but reduced with continued STM.   Seated ER AAROM with PVC -- x 20 repetitions through full ROM, no pain reported   Ranger to 170 degrees of motion x 10 repetitions for 2 sets (mild pain reported, however was noted to be tolerable and "felt better" after "stretching it out".                             PT Education - 12/24/16 0957    Education provided Yes   Education Details Progressing well with flexion ROM with ranger, will continue to progress with strengthening.    Person(s) Educated Patient   Methods Explanation;Demonstration   Comprehension Verbalized understanding;Returned demonstration             PT Long Term Goals - 12/03/16 1403      PT LONG TERM GOAL #1   Title Patient will demonstrate at least 150 degrees of active shoulder flexion in 10 weeks to complete ADLs.    Baseline 46 degree flexion PROM   Time 4   Period Weeks     PT LONG TERM GOAL #2   Title Patient will report a QuickDASH score of 30% disability in 10 weeks to demonstrate improved tolerance for ADLs.  Baseline 100% disability   Time 4   Period Weeks   Status New     PT LONG TERM GOAL #3   Title Patient will be able to lift 10# overhead with increase in pain of no more than 1/10 to demonstrate improved tolerance for ADLs.    Baseline Unable                Plan - 12/24/16 0958    Clinical Impression Statement Patient demonstrates increased pain and weakness with AAROM flexion (~90 deg) and ER (~50 deg). When performing PROM shoulder flexion with the UE Ranger, he reached ~165 degrees of flexion and shows progression in ROM. Weak shoulder musculature and pain is his main limiting factors in this stage of rehab. He was instructed to do finger climbing up the wall at home and to continue HEP.   Rehab Potential Good   PT Frequency 2x / week   PT Duration 12 weeks   PT  Treatment/Interventions Passive range of motion;Therapeutic exercise;Therapeutic activities;Balance training;Taping;Dry needling;Manual techniques;Ultrasound;Moist Heat;Iontophoresis /ml Dexamethasone;Cryotherapy;Electrical Stimulation;ADLs/Self Care Home Management   PT Next Visit Plan Continue with PROM per protocol.    PT Home Exercise Plan PROM in Flexion, abduction, internal rotation and external rotation.   Consulted and Agree with Plan of Care Patient      Patient will benefit from skilled therapeutic intervention in order to improve the following deficits and impairments:  Pain, Decreased range of motion, Impaired UE functional use, Decreased strength  Visit Diagnosis: Chronic left shoulder pain  S/P left rotator cuff repair     Problem List Patient Active Problem List   Diagnosis Date Noted  . Substance induced mood disorder (HCC) 05/19/2016  . Severe recurrent major depression without psychotic features (HCC) 05/19/2016  . Suicidal ideation 05/19/2016  . HTN (hypertension) 05/19/2016  . GERD (gastroesophageal reflux disease) 05/19/2016  . Alcohol use disorder, mild, abuse 05/19/2016   Alva Garnet PT, DPT, CSCS    12/24/2016, 3:32 PM  [place] [facility] PHYSICAL [facility] SPORTS MEDICINE [address] [place], [address] Phone: [phone]   Fax:  [phone]  Name: Todd [name] MRN: [id number] Date of Birth: [birth year]

## 2016-12-28 ENCOUNTER — Ambulatory Visit: Payer: Medicare Other | Admitting: Physical Therapy

## 2016-12-28 DIAGNOSIS — Z9889 Other specified postprocedural states: Secondary | ICD-10-CM

## 2016-12-28 DIAGNOSIS — M25512 Pain in left shoulder: Principal | ICD-10-CM

## 2016-12-28 DIAGNOSIS — G8929 Other chronic pain: Secondary | ICD-10-CM

## 2016-12-28 NOTE — Therapy (Signed)
[place] [facility] [address], [place], [address] Phone: [phone]   Fax:  [phone]  Physical Therapy Treatment  Patient Details  Name: Todd [name] MRN: [id number] Date of Birth: 1965-10-22 Referring Provider: Dr. Ivin Poot  Encounter Date: 12/28/2016      PT End of Session - 12/28/16 1330    Visit Number 8   Number of Visits 25   Date for PT Re-Evaluation 03/25/17   PT Start Time 1306   PT Stop Time 1344   PT Time Calculation (min) 38 min   Activity Tolerance Patient tolerated treatment well   Behavior During Therapy [facility] for tasks assessed/performed      Past Medical History:  Diagnosis Date  . Anxiety   . Depression   . Diabetes mellitus without complication (HCC)   . GERD (gastroesophageal reflux disease)   . Hypertension   . Sleep apnea     Past Surgical History:  Procedure Laterality Date  . LEG AMPUTATION BELOW KNEE     left  . SHOULDER SURGERY      There were no vitals filed for this visit.      Subjective Assessment - 12/28/16 1310    Subjective Patient reports he had a follow up with MD, they are pleased with progress with pain and ROM, will follow up in 6 weeks. He reports his pain levels are decreased from previous sessions.     Limitations Lifting;House hold activities   Patient Stated Goals To increase his ROM and return to weightlifting.    Currently in Pain? Yes  Mild pain at rest, increased with end range movements or activation of RTC   Pain Location Arm   Pain Orientation Left   Pain Descriptors / Indicators Aching;Sore;Operative site guarding   Pain Type Chronic pain;Surgical pain   Pain Onset 1 to 4 weeks ago   Pain Frequency Intermittent      PROM - ER which was much improved from previous session (to roughly 50-60 degrees) continues to have end feel consistent with soft tissue,  Reports mild pain at end range, though quite tolerable and reduced from previous  sessions.   PROM flexion to 2nd pillow ~ 130-140 degrees -- reports no pain/discomfort   Seated AAROM ER with PVC x 20   Seated AAROM supine - to 153 degrees x 12 repetitions on second bout x 8 repetitions to 163 degrees   UE ranger -- 2 sets x 12 repetitions (able to get to roughly 170 degrees) no pain reported.   Standing 3 point rows x 12 -- cuing for scapular retraction, no pain reported.                            PT Education - 12/28/16 1329    Education provided Yes   Education Details Will begin AROM in next session per protocol.    Person(s) Educated Patient   Methods Explanation;Demonstration   Comprehension Verbalized understanding;Returned demonstration             PT Long Term Goals - 12/03/16 1403      PT LONG TERM GOAL #1   Title Patient will demonstrate at least 150 degrees of active shoulder flexion in 10 weeks to complete ADLs.    Baseline 46 degree flexion PROM   Time 4   Period Weeks     PT LONG TERM GOAL #2   Title Patient will report a QuickDASH  score of 30% disability in 10 weeks to demonstrate improved tolerance for ADLs.    Baseline 100% disability   Time 4   Period Weeks   Status New     PT LONG TERM GOAL #3   Title Patient will be able to lift 10# overhead with increase in pain of no more than 1/10 to demonstrate improved tolerance for ADLs.    Baseline Unable                Plan - 12/28/16 1330    Clinical Impression Statement Patient had a good follow up with MD, demonstrating improved ER (50-60 degrees) with AAROM flexion to 170 degrees on this date without pain. He is able to perform very low level active flexion as well (to 70-90 degrees before onset of pain). He is progressing quite well, pain levels are subsiding and ROM is progressively improving in all directions.    Rehab Potential Good   PT Frequency 2x / week   PT Duration 12 weeks   PT Treatment/Interventions Passive range of motion;Therapeutic  exercise;Therapeutic activities;Balance training;Taping;Dry needling;Manual techniques;Ultrasound;Moist Heat;Iontophoresis 4mg /ml Dexamethasone;Cryotherapy;Electrical Stimulation;ADLs/Self Care Home Management   PT Next Visit Plan Continue with PROM per protocol.    PT Home Exercise Plan PROM in Flexion, abduction, internal rotation and external rotation.   Consulted and Agree with Plan of Care Patient      Patient will benefit from skilled therapeutic intervention in order to improve the following deficits and impairments:  Pain, Decreased range of motion, Impaired UE functional use, Decreased strength  Visit Diagnosis: Chronic left shoulder pain  S/P left rotator cuff repair     Problem List Patient Active Problem List   Diagnosis Date Noted  . Substance induced mood disorder (HCC) 05/19/2016  . Severe recurrent major depression without psychotic features (HCC) 05/19/2016  . Suicidal ideation 05/19/2016  . HTN (hypertension) 05/19/2016  . GERD (gastroesophageal reflux disease) 05/19/2016  . Alcohol use disorder, mild, abuse 05/19/2016   Alva GarnetPatrick Vidur Knust PT, DPT, CSCS    12/28/2016, 4:14 PM   [facility] [address] [place], [place], [address] Phone: [phone]   Fax:  [phone]  Name: Todd [name] MRN: [id number] Date of Birth: 03/19/1966

## 2016-12-28 NOTE — Patient Instructions (Addendum)
PROM - ER   PROM flexion to 2nd pillow ~ 130-140 degrees -- reports no pain/discomfort   Seated AAROM ER with PVC x 20   Seated AAROM supine - to 153 degrees x 12 repetitions on second bout x 8 repetitions to 163 degrees   UE ranger   Standing 3 point rows x 12

## 2016-12-30 ENCOUNTER — Ambulatory Visit: Payer: Medicare Other | Admitting: Physical Therapy

## 2017-01-05 ENCOUNTER — Ambulatory Visit: Payer: Medicare Other | Admitting: Physical Therapy

## 2017-01-05 DIAGNOSIS — G8929 Other chronic pain: Secondary | ICD-10-CM

## 2017-01-05 DIAGNOSIS — M25512 Pain in left shoulder: Secondary | ICD-10-CM | POA: Diagnosis not present

## 2017-01-05 DIAGNOSIS — Z9889 Other specified postprocedural states: Secondary | ICD-10-CM

## 2017-01-05 NOTE — Patient Instructions (Addendum)
Flexion to 161 degrees on LUE (mild pain at top and with eccentric portion)   Combined extension/adduction/IR -- to roughly midline of L2/L3   TM weightbearing with 5-10 degree elbow bends after 10 weight shifts x 5 for 2 sets (progressed on each set to walking further back)  Omega row x 12 with 10# for 2 sets, 15# x 10 on 3rd set   Scaption with 1# DB x 10, with 2#   ER sidelying x 12 (heavy but appropriate) for 2 sets   Chest press with 1# db x 10 reps for 2 sets (very mild pain in biceps and anterior shoulder -- 0.5-1/10 pain)

## 2017-01-05 NOTE — Therapy (Signed)
[place] [facility]INE [address], [place], [address] Phone: [phone]   Fax:  [phone]  Physical Therapy Treatment  Patient Details  Name: Todd [name] MRN: [id number] Date of Birth: July 05, 1966 Referring Provider: Dr. Ivin Poot  Encounter Date: 01/05/2017      PT End of Session - 01/05/17 1101    Visit Number 9   Number of Visits 25   Date for PT Re-Evaluation 03/25/17   PT Start Time 1038   PT Stop Time 1116   PT Time Calculation (min) 38 min   Activity Tolerance Patient tolerated treatment well   Behavior During Therapy [facility] for tasks assessed/performed      Past Medical History:  Diagnosis Date  . Anxiety   . Depression   . Diabetes mellitus without complication (HCC)   . GERD (gastroesophageal reflux disease)   . Hypertension   . Sleep apnea     Past Surgical History:  Procedure Laterality Date  . LEG AMPUTATION BELOW KNEE     left  . SHOULDER SURGERY      There were no vitals filed for this visit.      Subjective Assessment - 01/05/17 1038    Subjective Patient reports he has been having a lot of trouble with sleep quality and duration, has recently had a change in his medications. Reports even with this change in medication he is still having trouble with sleep quality.    Limitations Lifting;House hold activities   Patient Stated Goals To increase his ROM and return to weightlifting.    Currently in Pain? Yes   Pain Score --  Patient reports he still gets pain, though decreasing by week with OH movements (like putting hand on the back of his head).    Pain Location Arm   Pain Orientation Left   Pain Descriptors / Indicators Aching   Pain Type Chronic pain;Surgical pain   Pain Onset 1 to 4 weeks ago   Pain Frequency Intermittent     Flexion to 161 degrees on LUE (mild pain at top and with eccentric portion)   Combined extension/adduction/IR -- to roughly midline of L2/L3   TM  weightbearing with 5-10 degree elbow bends after 10 weight shifts x 5 for 2 sets (progressed on each set to walking further back)  Omega row x 12 with 10# for 2 sets, 15# x 10 on 3rd set   Scaption with 1# DB x 10, with 2# (felt pain, second set with 1#)   ER sidelying x 12 (heavy but appropriate) for 2 sets -- cuing for proper execution  Chest press with 1# db x 10 reps for 2 sets (very mild pain in biceps and anterior shoulder -- 0.5-1/10 pain)                              PT Education - 01/05/17 1101    Education provided Yes   Education Details Progressing quite well with ROM, will begin progressive but initially light strengthening.    Person(s) Educated Patient   Methods Demonstration;Explanation   Comprehension Verbalized understanding;Returned demonstration             PT Long Term Goals - 12/03/16 1403      PT LONG TERM GOAL #1   Title Patient will demonstrate at least 150 degrees of active shoulder flexion in 10 weeks to complete ADLs.    Baseline 46 degree flexion PROM  Time 4   Period Weeks     PT LONG TERM GOAL #2   Title Patient will report a QuickDASH score of 30% disability in 10 weeks to demonstrate improved tolerance for ADLs.    Baseline 100% disability   Time 4   Period Weeks   Status New     PT LONG TERM GOAL #3   Title Patient will be able to lift 10# overhead with increase in pain of no more than 1/10 to demonstrate improved tolerance for ADLs.    Baseline Unable                Plan - 01/05/17 1101    Clinical Impression Statement Patient demonstrates significant improvement in passive ER ROM, (70-80 degrees) with significant increase in active flexion (to 161 degrees with mild pain on eccentric lowering). Patient is progressing well with gentle strengthening on this date, and will be progressed gradually to increasing resistances as tolerated.    Rehab Potential Good   PT Frequency 2x / week   PT Duration 12  weeks   PT Treatment/Interventions Passive range of motion;Therapeutic exercise;Therapeutic activities;Balance training;Taping;Dry needling;Manual techniques;Ultrasound;Moist Heat;Iontophoresis 4mg /ml Dexamethasone;Cryotherapy;Electrical Stimulation;ADLs/Self Care Home Management   PT Next Visit Plan Continue with PROM per protocol.    PT Home Exercise Plan PROM in Flexion, abduction, internal rotation and external rotation.   Consulted and Agree with Plan of Care Patient      Patient will benefit from skilled therapeutic intervention in order to improve the following deficits and impairments:  Pain, Decreased range of motion, Impaired UE functional use, Decreased strength  Visit Diagnosis: Chronic left shoulder pain  S/P left rotator cuff repair     Problem List Patient Active Problem List   Diagnosis Date Noted  . Substance induced mood disorder (HCC) 05/19/2016  . Severe recurrent major depression without psychotic features (HCC) 05/19/2016  . Suicidal ideation 05/19/2016  . HTN (hypertension) 05/19/2016  . GERD (gastroesophageal reflux disease) 05/19/2016  . Alcohol use disorder, mild, abuse 05/19/2016   Alva GarnetPatrick McNamara PT, DPT, CSCS    01/05/2017, 12:30 PM  [facility] [address] [place], [place], [address] Phone: [phone]   Fax:  [phone]  Name: Todd [name] MRN: [id number] Date of Birth: 06/25/1966

## 2017-01-08 ENCOUNTER — Ambulatory Visit: Payer: Medicare Other | Admitting: Physical Therapy

## 2017-01-11 ENCOUNTER — Ambulatory Visit: Payer: Medicare Other | Attending: Orthopedic Surgery | Admitting: Physical Therapy

## 2017-01-11 DIAGNOSIS — G8929 Other chronic pain: Secondary | ICD-10-CM | POA: Diagnosis present

## 2017-01-11 DIAGNOSIS — Z9889 Other specified postprocedural states: Secondary | ICD-10-CM | POA: Insufficient documentation

## 2017-01-11 DIAGNOSIS — M25512 Pain in left shoulder: Secondary | ICD-10-CM | POA: Insufficient documentation

## 2017-01-11 NOTE — Therapy (Signed)
[place] [facility] [address], [place], [address] Phone: [phone]   Fax:  [phone]  Physical Therapy Treatment  Patient Details  Name: Todd [name] MRN: [id number] Date of Birth: 1966/09/01 Referring Provider: Dr. Ivin Poot  Encounter Date: 01/11/2017      PT End of Session - 01/11/17 1314    Visit Number 10   Number of Visits 25   Date for PT Re-Evaluation 03/25/17   PT Start Time 0950   PT Stop Time 1030   PT Time Calculation (min) 40 min   Activity Tolerance Patient tolerated treatment well   Behavior During Therapy [facility] for tasks assessed/performed      Past Medical History:  Diagnosis Date  . Anxiety   . Depression   . Diabetes mellitus without complication (HCC)   . GERD (gastroesophageal reflux disease)   . Hypertension   . Sleep apnea     Past Surgical History:  Procedure Laterality Date  . LEG AMPUTATION BELOW KNEE     left  . SHOULDER SURGERY      There were no vitals filed for this visit.      Subjective Assessment - 01/11/17 0959    Subjective Patient reports he has been having increasingly difficult time falling asleep and staying asleep. He feels like he is not getting adequate sleep and has had some emesis related to his GERD, though reports no weight loss.    Limitations Lifting;House hold activities   Patient Stated Goals To increase his ROM and return to weightlifting.    Currently in Pain? Other (Comment)  Reports mild to moderate pain with some movements, though appears to be reducing in frequency and intensity.    Pain Onset --      AROM - 161 degrees on L mild pain at the end of range and on return via eccentric motion.   62 degrees passively into ER -- performed repeated bouts of low load, long duration stretching into ER to tolerance, patient reports mild pain to no pain, much less than initially completing this post-op.   Chest press with 1# DB x 12 (easy) with 2#  weight x 10 (appropriate technique noted, did not increase his symptoms.)  Scaption in supine with 1# DB x 10 (fairly easy) -- 2# DB x 10 -- through roughly 110-120 degrees of forward elevation without onset of pain.   Bicep curls with 2# DB x 10 for 2 sets (not painful, appropriate activation)   Scaption in sitting with 1# DB x10 (appropriate range, no shrugging noted).                            PT Education - 01/11/17 1314    Education provided Yes   Education Details Will continue to progress with protocol, though important for him to see MD to increase his sleep duration.    Person(s) Educated Patient   Methods Explanation;Demonstration   Comprehension Verbalized understanding;Returned demonstration             PT Long Term Goals - 12/03/16 1403      PT LONG TERM GOAL #1   Title Patient will demonstrate at least 150 degrees of active shoulder flexion in 10 weeks to complete ADLs.    Baseline 46 degree flexion PROM   Time 4   Period Weeks     PT LONG TERM GOAL #2   Title Patient will report a QuickDASH  score of 30% disability in 10 weeks to demonstrate improved tolerance for ADLs.    Baseline 100% disability   Time 4   Period Weeks   Status New     PT LONG TERM GOAL #3   Title Patient will be able to lift 10# overhead with increase in pain of no more than 1/10 to demonstrate improved tolerance for ADLs.    Baseline Unable                Plan - 24-Jan-2017 1315    Clinical Impression Statement Patient is progressing nicely with rehab, but would benefit from going to an MD to address his sleeping deficits as this would have a direct impact on his therapy. Patient is tolerating loading well and progressively increasing from week to week.    Rehab Potential Good   PT Frequency 2x / week   PT Duration 12 weeks   PT Treatment/Interventions Passive range of motion;Therapeutic exercise;Therapeutic activities;Balance training;Taping;Dry  needling;Manual techniques;Ultrasound;Moist Heat;Iontophoresis /ml Dexamethasone;Cryotherapy;Electrical Stimulation;ADLs/Self Care Home Management   PT Next Visit Plan Continue with PROM per protocol.    PT Home Exercise Plan PROM in Flexion, abduction, internal rotation and external rotation.   Consulted and Agree with Plan of Care Patient      Patient will benefit from skilled therapeutic intervention in order to improve the following deficits and impairments:  Pain, Decreased range of motion, Impaired UE functional use, Decreased strength  Visit Diagnosis: S/P left rotator cuff repair  Chronic left shoulder pain       G-Codes - 01/24/2017 1315    Functional Assessment Tool Used (Outpatient Only) Precautions    Functional Limitation Carrying, moving and handling objects   Carrying, Moving and Handling Objects Current Status (Z6109) At least 20 percent but less than 40 percent impaired, limited or restricted   Carrying, Moving and Handling Objects Goal Status (U0454) At least 1 percent but less than 20 percent impaired, limited or restricted      Problem List Patient Active Problem List   Diagnosis Date Noted  . Substance induced mood disorder (HCC) 05/19/2016  . Severe recurrent major depression without psychotic features (HCC) 05/19/2016  . Suicidal ideation 05/19/2016  . HTN (hypertension) 05/19/2016  . GERD (gastroesophageal reflux disease) 05/19/2016  . Alcohol use disorder, mild, abuse 05/19/2016    Alva Garnet PT, DPT, CSCS    Jan 24, 2017, 1:22 PM  [place] [facility] PH[facility] SPORTS MEDICINE [address], [place], [address] Phone: [phone]   Fax:  [phone]  Name: Warner Laduca MRN: [id number] Date of Birth: [birth year]

## 2017-01-11 NOTE — Patient Instructions (Addendum)
AROM - 161 degrees on L   62 degrees passively into ER   Chest press with 1# DB x 12 (easy) with 2# weight x 10  Scaption in supine with 1# DB x 10 (fairly easy) -- 2# DB x 10  Bicep curls with 2# DB  Scaption in sitting with 1# DB

## 2017-01-14 ENCOUNTER — Ambulatory Visit: Payer: Medicare Other | Admitting: Physical Therapy

## 2017-01-18 ENCOUNTER — Ambulatory Visit: Payer: Medicare Other | Admitting: Physical Therapy

## 2017-01-18 DIAGNOSIS — Z9889 Other specified postprocedural states: Secondary | ICD-10-CM

## 2017-01-18 DIAGNOSIS — G8929 Other chronic pain: Secondary | ICD-10-CM

## 2017-01-18 DIAGNOSIS — M25512 Pain in left shoulder: Secondary | ICD-10-CM

## 2017-01-18 NOTE — Therapy (Signed)
[place] [facility] [address], [place], [address] Phone: [phone]   Fax:  [phone]  Physical Therapy Treatment  Patient Details  Name: Todd [name] MRN: [id number] Date of Birth: 09-27-1966 Referring Provider: Dr. Ivin Poot  Encounter Date: 01/18/2017      PT End of Session - 01/18/17 0959    Visit Number 11   Number of Visits 25   Date for PT Re-Evaluation 03/25/17   PT Start Time 0955   PT Stop Time 1033   PT Time Calculation (min) 38 min   Activity Tolerance Patient tolerated treatment well   Behavior During Therapy [facility] for tasks assessed/performed      Past Medical History:  Diagnosis Date  . Anxiety   . Depression   . Diabetes mellitus without complication (HCC)   . GERD (gastroesophageal reflux disease)   . Hypertension   . Sleep apnea     Past Surgical History:  Procedure Laterality Date  . LEG AMPUTATION BELOW KNEE     left  . SHOULDER SURGERY      There were no vitals filed for this visit.      Subjective Assessment - 01/18/17 0957    Subjective Patient reports he continues to have some pain with lifting his arm in the biceps region, reports he has been doing his HEP. He reports he has had some anxiety about moving his arm due to pain.    Limitations Lifting;House hold activities   Patient Stated Goals To increase his ROM and return to weightlifting.    Currently in Pain? Other (Comment)      AROM - 159 degrees   ER PROM to 70-80 degrees at 90 degrees of abduction   Omega rows at 20# x 10 repetitions, 35# x 11, 55# x x10  TM push ups x 10 (fairly easy, no increase in pain), progressed to table at lower height (had to increase height) x 6 for 2 sets   Bicep curls with 3# DB x 12, 4# x12 repetitions,  5# x 10   Scaption - 2# x 12 repetitions 3# x 10 (no increase in pain) -- more challenging as PT had him go further into scaption initially closer to flexion  Chest press with 5#  DB x 10 (easyfor him) progressed to 7# x 10, 7# x10 repetitions   Low rows x 12 with blue t-band (easy) progressed to 2 blue bands x 12 repetitions for 2 sets                            PT Education - 01/18/17 0959    Education provided Yes   Education Details Will continue to progress with strengthening program as he is tolerating quite well to this point.    Person(s) Educated Patient   Methods Explanation;Demonstration   Comprehension Verbalized understanding;Returned demonstration             PT Long Term Goals - 12/03/16 1403      PT LONG TERM GOAL #1   Title Patient will demonstrate at least 150 degrees of active shoulder flexion in 10 weeks to complete ADLs.    Baseline 46 degree flexion PROM   Time 4   Period Weeks     PT LONG TERM GOAL #2   Title Patient will report a QuickDASH score of 30% disability in 10 weeks to demonstrate improved tolerance for ADLs.    Baseline 100%  disability   Time 4   Period Weeks   Status New     PT LONG TERM GOAL #3   Title Patient will be able to lift 10# overhead with increase in pain of no more than 1/10 to demonstrate improved tolerance for ADLs.    Baseline Unable                Plan - 01/18/17 1000    Clinical Impression Statement Patient continues to progress well with loading of peri-scapular musculature. His ROM is consistely > 150 degrees without pain actively and his ability to tolerate higher level strengthening activities continues to improve. He is making excellent progress towards established rehab goals.    Rehab Potential Good   PT Frequency 2x / week   PT Duration 12 weeks   PT Treatment/Interventions Passive range of motion;Therapeutic exercise;Therapeutic activities;Balance training;Taping;Dry needling;Manual techniques;Ultrasound;Moist Heat;Iontophoresis /ml Dexamethasone;Cryotherapy;Electrical Stimulation;ADLs/Self Care Home Management   PT Next Visit Plan Continue with PROM per  protocol.    PT Home Exercise Plan PROM in Flexion, abduction, internal rotation and external rotation.   Consulted and Agree with Plan of Care Patient      Patient will benefit from skilled therapeutic intervention in order to improve the following deficits and impairments:  Pain, Decreased range of motion, Impaired UE functional use, Decreased strength  Visit Diagnosis: S/P left rotator cuff repair  Chronic left shoulder pain     Problem List Patient Active Problem List   Diagnosis Date Noted  . Substance induced mood disorder (HCC) 05/19/2016  . Severe recurrent major depression without psychotic features (HCC) 05/19/2016  . Suicidal ideation 05/19/2016  . HTN (hypertension) 05/19/2016  . GERD (gastroesophageal reflux disease) 05/19/2016  . Alcohol use disorder, mild, abuse 05/19/2016   Alva Garnet PT, DPT, CSCS    01/18/2017, 12:46 PM  [place] [facility] PHYSICAL AND SPORTS MEDICINE [address] [place], [address] Phone: [phone]   Fax:  [phone]  Name: Todd [name] MRN: [id number] Date of Birth: 1965/11/15

## 2017-01-18 NOTE — Patient Instructions (Addendum)
AROM - 159 degrees   ER PROM to 70-80 degrees at 90 degrees of abduction   Omega rows at 20# x 10 repetitions, 35# x 11, 55# x x10  TM push ups x 10 (fairly easy, no increase in pain), progressed to table at lower height (had to increase height) x 6 for 2 sets   Bicep curls with 3# DB x 12, 4# x12 repetitions,  5# x 10   Scaption - 2# x 12 repetitions 3# x 10 (no increase in pain) -- more challenging as PT had him go further into scaption initially closer to flexion  Chest press with 5# DB x 10 (easyfor him) progressed to 7# x 10, 7# x10 repetitions   Low rows x 12 with blue t-band (easy) progressed to 2 blue bands x 12 repetitions for 2 sets

## 2017-01-21 ENCOUNTER — Ambulatory Visit: Payer: Medicare Other | Admitting: Physical Therapy

## 2017-01-22 ENCOUNTER — Other Ambulatory Visit: Payer: Self-pay | Admitting: Family Medicine

## 2017-01-25 ENCOUNTER — Ambulatory Visit: Payer: Medicare Other | Admitting: Physical Therapy

## 2017-01-25 DIAGNOSIS — Z9889 Other specified postprocedural states: Secondary | ICD-10-CM | POA: Diagnosis not present

## 2017-01-25 DIAGNOSIS — M25512 Pain in left shoulder: Secondary | ICD-10-CM

## 2017-01-25 DIAGNOSIS — G8929 Other chronic pain: Secondary | ICD-10-CM

## 2017-01-25 NOTE — Patient Instructions (Addendum)
AROM to 163 degrees on LUE into flexion   AROM abduction to 161 degrees on LUE (pain reported at 90 degrees)   PROM - 54 degrees after mobilizations/low load long duration stretching measured at 63 degrees   Bicep curls on LUE with 6# DB x 10 for 2 sets (2" up, 2" down)   Wall slides with yellow t-band x 12 repetitions x 2 sets   Scaption with 3# x 10 repetitions (began to feel weak by 7-8 reps in) progressed to 4# weight x 6 repetitions for 2 sets   Standing rows with 5# on cable machine x12 reps (easy) progressed to 10# x12, progressed to 15# x12 (cuing for more pure retraction)  Low rows on OMEGA machine x 5# x 12 repetitions with 7# x 10 repetitions (appropriate technique noted).

## 2017-01-26 ENCOUNTER — Ambulatory Visit
Admission: RE | Admit: 2017-01-26 | Discharge: 2017-01-26 | Disposition: A | Discharge status: Home / Self Care | Payer: Medicare Other | Source: Ambulatory Visit | Attending: Family Medicine | Admitting: Family Medicine

## 2017-01-26 DIAGNOSIS — K760 Fatty (change of) liver, not elsewhere classified: Secondary | ICD-10-CM | POA: Diagnosis not present

## 2017-01-26 NOTE — Therapy (Signed)
[place] [facility] PHYS[facility] SPORTS MEDICINE [address] [place], [address] Phone: [phone]   Fax:  [phone]  Physical Therapy Treatment  Patient Details  Name: Todd [name] MRN: [id number] Date of Birth: [birth year] Referring Provider: Dr. Ivin Poot  Encounter Date: 01/25/2017      PT End of Session - 01/25/17 0926    Visit Number 12   Number of Visits 25   Date for PT Re-Evaluation 03/25/17   PT Start Time 0919   PT Stop Time 1000   PT Time Calculation (min) 41 min   Activity Tolerance Patient tolerated treatment well   Behavior During Therapy [facility] for tasks assessed/performed      Past Medical History:  Diagnosis Date  . Anxiety   . Depression   . Diabetes mellitus without complication (HCC)   . GERD (gastroesophageal reflux disease)   . Hypertension   . Sleep apnea     Past Surgical History:  Procedure Laterality Date  . LEG AMPUTATION BELOW KNEE     left  . SHOULDER SURGERY      There were no vitals filed for this visit.      Subjective Assessment - 01/25/17 0920    Subjective Patient reports he has been very tired/fatigue, due to having trouble falling asleep. He was able to go fishing. He continues to have L shoulder pain "all the time", reports he was not having pain before the surgery, unless he was pushing through heavy exercise or certain movements. Reports this pain has not been getting worse, but has not resolved either (reports it is not as bad as it was).     Limitations Lifting;House hold activities   Patient Stated Goals To increase his ROM and return to weightlifting.    Currently in Pain? Other (Comment)  Patient reports constant level of low to moderate pain with movements of his LUE      AROM to 163 degrees on LUE into flexion   AROM abduction to 161 degrees on LUE (pain reported at 90 degrees)   PROM - 54 degrees after mobilizations/low load long duration stretching measured at 63 degrees    Bicep curls on LUE with 6# DB x 10 for 2 sets (2" up, 2" down)   Wall slides with yellow t-band x 12 repetitions x 2 sets   Scaption with 3# x 10 repetitions (began to feel weak by 7-8 reps in) progressed to 4# weight x 6 repetitions for 2 sets   Standing rows with 5# on cable machine x12 reps (easy) progressed to 10# x12, progressed to 15# x12 (cuing for more pure retraction)  Low rows on OMEGA machine x 5# x 12 repetitions with 7# x 10 repetitions (appropriate technique noted).                            PT Education - 01/25/17 [phone]    Education provided Yes   Education Details Need to continue to work on passive external rotation ROM to reduce pain levels.    Person(s) Educated Patient   Methods Explanation;Demonstration   Comprehension Verbalized understanding;Returned demonstration             PT Long Term Goals - 12/03/16 1403      PT LONG TERM GOAL #1   Title Patient will demonstrate at least 150 degrees of active shoulder flexion in 10 weeks to complete ADLs.    Baseline 46 degree flexion PROM  Time 4   Period Weeks     PT LONG TERM GOAL #2   Title Patient will report a QuickDASH score of 30% disability in 10 weeks to demonstrate improved tolerance for ADLs.    Baseline 100% disability   Time 4   Period Weeks   Status New     PT LONG TERM GOAL #3   Title Patient will be able to lift 10# overhead with increase in pain of no more than 1/10 to demonstrate improved tolerance for ADLs.    Baseline Unable                Plan - 01/25/17 0942    Clinical Impression Statement Patient continues to progress with active motion and strength tolerance with targeted exercises. He reports he continues to have some level of pain with all shoulder movement, which he has had since surgery. He is otherwise progressing well, will continue to benefit from additional strengthening of peri-scapular and glenohumeral musculature for reduced pain with  movement of his LUE.    Rehab Potential Good   PT Frequency 2x / week   PT Duration 12 weeks   PT Treatment/Interventions Passive range of motion;Therapeutic exercise;Therapeutic activities;Balance training;Taping;Dry needling;Manual techniques;Ultrasound;Moist Heat;Iontophoresis /ml Dexamethasone;Cryotherapy;Electrical Stimulation;ADLs/Self Care Home Management   PT Next Visit Plan Continue with PROM per protocol.    PT Home Exercise Plan PROM in Flexion, abduction, internal rotation and external rotation.   Consulted and Agree with Plan of Care Patient      Patient will benefit from skilled therapeutic intervention in order to improve the following deficits and impairments:  Pain, Decreased range of motion, Impaired UE functional use, Decreased strength  Visit Diagnosis: S/P left rotator cuff repair  Chronic left shoulder pain     Problem List Patient Active Problem List   Diagnosis Date Noted  . Substance induced mood disorder (HCC) 05/19/2016  . Severe recurrent major depression without psychotic features (HCC) 05/19/2016  . Suicidal ideation 05/19/2016  . HTN (hypertension) 05/19/2016  . GERD (gastroesophageal reflux disease) 05/19/2016  . Alcohol use disorder, mild, abuse 05/19/2016   Alva Garnet PT, DPT, CSCS    01/26/2017, 12:46 PM  [place] PHYSICAL AND SPORTS MEDICINE [address], [place], [address] Phone: [phone]   Fax:  [phone]  Name: Shimon Trowbridge MRN: [id number] Date of Birth: Jul 12, 1966

## 2017-01-28 ENCOUNTER — Ambulatory Visit: Payer: Medicare Other | Admitting: Physical Therapy

## 2017-01-28 ENCOUNTER — Other Ambulatory Visit: Payer: Self-pay | Admitting: Student

## 2017-01-28 DIAGNOSIS — R1010 Upper abdominal pain, unspecified: Secondary | ICD-10-CM

## 2017-01-28 DIAGNOSIS — Z9889 Other specified postprocedural states: Secondary | ICD-10-CM

## 2017-01-28 DIAGNOSIS — M25512 Pain in left shoulder: Secondary | ICD-10-CM

## 2017-01-28 DIAGNOSIS — G8929 Other chronic pain: Secondary | ICD-10-CM

## 2017-01-28 NOTE — Therapy (Signed)
[facility] [address], [place], [address] Phone: [phone]   Fax:  [phone]  Physical Therapy Treatment  Patient Details  Name: Todd [name] MRN: [id number] Date of Birth: 1966/03/22 Referring Provider: Dr. Alvin Critchley  Encounter Date: 01/28/2017      PT End of Session - 01/28/17 1409    Visit Number 13   Number of Visits 25   Date for PT Re-Evaluation 03/25/17   PT Start Time 5397   PT Stop Time 1425   PT Time Calculation (min) 36 min   Activity Tolerance Patient tolerated treatment well   Behavior During Therapy [facility] for tasks assessed/performed      Past Medical History:  Diagnosis Date  . Anxiety   . Depression   . Diabetes mellitus without complication ([place])   . GERD (gastroesophageal reflux disease)   . Hypertension   . Sleep apnea     Past Surgical History:  Procedure Laterality Date  . LEG AMPUTATION BELOW KNEE     left  . SHOULDER SURGERY      There were no vitals filed for this visit.      Subjective Assessment - 01/28/17 1350    Subjective Patient reports he is able ot have full range of motion with his L arm with no pain as long as he moves his arm quickly. He has been having GI issues and will be having a procedure in the near future.    Limitations Lifting;House hold activities   Patient Stated Goals To increase his ROM and return to weightlifting.    Currently in Pain? Other (Comment)  No pain at rest, increase in pain if shoulder movements performed slowly.       AROM flexion to 173 degrees with no pain when he performs quickly  AROM abduction to 163 degrees   Wall slides x15 with red t-band with foam roller for 2 sets   Scaption with 4# -- 8 repetitions -- progressed to 6# x 8 repetitions   Single arm cable rows 15# x 8, 20# x 8, 25# x 8 (still fairly easy for him)   Chest Press with 8# x 8 (easy), with 10# x 10 (still fairly easy for him)   90-90 ER with green  t-band x8 (no pain reported) -- more plyometric with yellow t-band x 8                            PT Education - 01/28/17 1413    Education provided Yes   Education Details Provided gym routine and instructions to return next week for follow up on how gym routine goes.    Person(s) Educated Patient   Methods Explanation;Demonstration;Handout   Comprehension Verbalized understanding;Returned demonstration             PT Long Term Goals - 12/03/16 1403      PT LONG TERM GOAL #1   Title Patient will demonstrate at least 150 degrees of active shoulder flexion in 10 weeks to complete ADLs.    Baseline 46 degree flexion PROM   Time 4   Period Weeks     PT LONG TERM GOAL #2   Title Patient will report a QuickDASH score of 30% disability in 10 weeks to demonstrate improved tolerance for ADLs.    Baseline 100% disability   Time 4   Period Weeks   Status New     PT LONG  TERM GOAL #3   Title Patient will be able to lift 10# overhead with increase in pain of no more than 1/10 to demonstrate improved tolerance for ADLs.    Baseline Unable                Plan - 01/28/17 1409    Clinical Impression Statement Patient demonstrates excellent ROM this date, he tolerates progression of strengthening activities quite well. He has met all ROM and strength goals with minimal pain. He was provided with gym based routine, will likely discharge if that goes well over the weekend.    Rehab Potential Good   PT Frequency 2x / week   PT Duration 12 weeks   PT Treatment/Interventions Passive range of motion;Therapeutic exercise;Therapeutic activities;Balance training;Taping;Dry needling;Manual techniques;Ultrasound;Moist Heat;Iontophoresis 29m/ml Dexamethasone;Cryotherapy;Electrical Stimulation;ADLs/Self Care Home Management   PT Next Visit Plan Continue with PROM per protocol.    PT Home Exercise Plan PROM in Flexion, abduction, internal rotation and external rotation.    Consulted and Agree with Plan of Care Patient      Patient will benefit from skilled therapeutic intervention in order to improve the following deficits and impairments:  Pain, Decreased range of motion, Impaired UE functional use, Decreased strength  Visit Diagnosis: S/P left rotator cuff repair  Chronic left shoulder pain     Problem List Patient Active Problem List   Diagnosis Date Noted  . Substance induced mood disorder ([place] 05/19/2016  . Severe recurrent major depression without psychotic features ([place] 05/19/2016  . Suicidal ideation 05/19/2016  . HTN (hypertension) 05/19/2016  . GERD (gastroesophageal reflux disease) 05/19/2016  . Alcohol use disorder, mild, abuse 05/19/2016   PRoyce MacadamiaPT, DPT, CSCS    01/28/2017, 3:32 PM  [facility] [address] [place] [address]Phone: [phone]  Fax:  [phone] Name: Todd BreighnerMRN: [id number] of Birth: 2July 25, 1967

## 2017-01-28 NOTE — Patient Instructions (Addendum)
AROM flexion to 173 degrees with no pain when he performs quickly  AROM abduction to 163 degrees   Wall slides x15 with red t-band with foam roller for 2 sets   Scaption with 4# -- 8 repetitions -- progressed to 6# x 8 repetitions   Single arm cable rows 15# x 8, 20# x 8, 25# x 8 (still fairly easy for him)   Chest Press with 8# x 8 (easy), with 10# x 10 (still fairly easy for him)   90-90 ER with green t-band x8 (no pain reported) -- more plyometric with yellow t-band x 8

## 2017-02-02 ENCOUNTER — Ambulatory Visit: Payer: Medicare Other | Admitting: Physical Therapy

## 2017-02-04 ENCOUNTER — Ambulatory Visit: Payer: Medicare Other | Admitting: Physical Therapy

## 2017-02-05 ENCOUNTER — Ambulatory Visit: Payer: Medicare Other | Admitting: Physical Therapy

## 2017-02-09 ENCOUNTER — Ambulatory Visit: Payer: Medicare Other | Attending: Orthopedic Surgery | Admitting: Physical Therapy

## 2017-02-09 DIAGNOSIS — M5441 Lumbago with sciatica, right side: Secondary | ICD-10-CM | POA: Insufficient documentation

## 2017-02-09 DIAGNOSIS — R262 Difficulty in walking, not elsewhere classified: Secondary | ICD-10-CM | POA: Insufficient documentation

## 2017-02-11 ENCOUNTER — Ambulatory Visit: Payer: Medicare Other | Admitting: Physical Therapy

## 2017-03-02 ENCOUNTER — Encounter
Admission: RE | Admit: 2017-03-02 | Discharge: 2017-03-02 | Disposition: A | Discharge status: Home / Self Care | Payer: Medicare Other | Source: Ambulatory Visit | Attending: Student | Admitting: Student

## 2017-03-02 ENCOUNTER — Ambulatory Visit: Payer: Medicare Other

## 2017-03-02 DIAGNOSIS — R1010 Upper abdominal pain, unspecified: Secondary | ICD-10-CM

## 2017-03-02 MED ORDER — TECHNETIUM TC 99M SULFUR COLLOID
2.5300 | Freq: Once | INTRAVENOUS | Status: AC | PRN
  Administered 2017-03-02: 2.53 via INTRAVENOUS

## 2017-03-08 ENCOUNTER — Other Ambulatory Visit: Payer: Self-pay

## 2017-03-10 ENCOUNTER — Ambulatory Visit: Payer: Medicare Other | Admitting: Physical Therapy

## 2017-03-10 DIAGNOSIS — M5441 Lumbago with sciatica, right side: Secondary | ICD-10-CM

## 2017-03-10 DIAGNOSIS — R262 Difficulty in walking, not elsewhere classified: Secondary | ICD-10-CM | POA: Diagnosis present

## 2017-03-10 NOTE — Therapy (Signed)
[place] [facility] [address] [place], [address] Phone: [phone]   Fax:  [phone]  Physical Therapy Evaluation  Patient Details  Name: Todd [name] MRN: [id number] Date of Birth: [birth year] Referring Provider: Dr. Ivin Poot  Encounter Date: 03/10/2017      PT End of Session - 03/10/17 1404    Visit Number 1   Number of Visits 13   Date for PT Re-Evaluation 05/05/17   PT Start Time 1352   PT Stop Time 1445   PT Time Calculation (min) 53 min   Activity Tolerance Patient tolerated treatment well;Patient limited by pain   Behavior During Therapy [facility] for tasks assessed/performed;Anxious      Past Medical History:  Diagnosis Date  . Anxiety   . Depression   . Diabetes mellitus without complication (HCC)   . GERD (gastroesophageal reflux disease)   . Hypertension   . Sleep apnea     Past Surgical History:  Procedure Laterality Date  . LEG AMPUTATION BELOW KNEE     left  . SHOULDER SURGERY      There were no vitals filed for this visit.       Subjective Assessment - 03/10/17 1353    Subjective Patient reports he began developing low back pain roughly 1 month ago, has been fairly severe. Reports he has pain in different positions (sitting, standing, laying) and has not found a position of comfort.Denies any change in bowel function. He is having superior abdominal pain and has lost 25# in the last month as well due to GI issues. He has been seeing multiple specialists for mangement.  Patient reports he had an episode of back pain 5 years ago which would go to the front of the leg. His pain now goes down the back of his leg to the calf.    Limitations Sitting;Lifting;Standing;Walking;House hold activities   Diagnostic tests MRI report - Question bilateral L5 pars defects. There is grade 1 anterolisthesis of L5 on S1. Multilevel disc space narrowing, most severe at L5-S1. There is also multilevel endplate  sclerosis/osteophytosis and facet arthrosis. Vertebral body heights are maintained.   Patient Stated Goals To have less pain in his back.    Currently in Pain? Yes   Pain Location Back   Pain Orientation Lower   Pain Descriptors / Indicators Aching   Pain Type Chronic pain   Pain Onset More than a month ago   Pain Frequency Constant   Aggravating Factors  Standing for prolonged periods   Pain Relieving Factors Has not found anything.       Standing extension - very minimal movement - significant increase in LBP pain with radiating pain down the R leg.  Flexion able to reach his toes no increase in pain  Rotations - painful bilaterally, limited ROM 25-50% Slump - negative on RLE  No reflex at patella or Achilles'  SLR - negative  Prone on elbows - appeared to reduce pain  CPAs mild pain around L1/L2, otherwise no pain (did not go to L5/S1)  Palpation - no pain  Hip ER felt good on R, IR WNL bilaterally  HS - WNL bilaterally  Hip extension on R "activated the nerve" reduced ROM actively  Hip extension on L - noted pain across the low back  mODI- 42% Supine bridge- reports some pain in R leg "nerve pain"   TherEx  Prone on elbows x 30" for 5 bouts (immediately after completing, patient reported decreased  pain and noted for increased lumbar extension ROM) R piriformis stretching x 3 bouts x 30" -- patient reported this was relieving of some symptoms. CPAs to L1/L2 -- grade I-II well tolerated at each level 30" x 3 bouts per segment          Objective measurements completed on examination: See above findings.                  PT Education - 03/10/17 1404    Education provided Yes   Education Details Went over MRI results that therapist could find. Perform prone on elbows, piriformis stretching as part of his HEP.    Person(s) Educated Patient   Methods Explanation;Demonstration;Handout   Comprehension Verbalized understanding;Returned demonstration              PT Long Term Goals - 03/10/17 1453      PT LONG TERM GOAL #1   Title Patient will report mODI of less than 30% to demonstrate improved tolerance for ADLs.    Baseline 42%   Time 8   Period Weeks   Status New     PT LONG TERM GOAL #2   Title Patient will perform full AROM of lumbar spine with no radiating pain or increase in lumbar pain to demonstrate improved ROM and tolerance for ADLs.    Baseline Severe pain that radiates with extension, milder with rotations.    Time 8   Period Weeks   Status New     PT LONG TERM GOAL #3   Title Patient will be able to stand for at least 30 minutes with no increase in symptoms to return to ADLs.    Baseline Pain with prolonged time (greater than 15 minutes) in any 1 position.    Time 8   Period Weeks   Status New                Plan - 03/10/17 1450    Clinical Impression Statement Patient presents with difficult history as taking his history is scattered. 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 He will be following up with MD on June 7 to discuss any surgical options. He seems to respond well to low level extension based work today, though pain pattern is variable. He does seem to have true sciatic distribution of pain on R side, appears to be provoked by standing extension. He would benefit from skilled PT services to address new onset of back pain to allow him to complete more functional activities without as much limitation due to pain.    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    Clinical Presentation Evolving   Clinical Decision Making High   Rehab Potential Fair   Clinical Impairments Affecting Rehab Potential Noted to have mass on spinal cord on MRI,  possible surgical candidate, follow up June 7. Weight loss due to GI issues.   PT Frequency 2x / week   PT Duration 6 weeks   PT Treatment/Interventions Passive range of motion;Therapeutic exercise;Therapeutic activities;Balance training;Taping;Dry needling;Manual techniques;Ultrasound;Moist Heat;Iontophoresis 4mg /ml Dexamethasone;Cryotherapy;Electrical Stimulation;ADLs/Self Care Home Management   PT Next Visit Plan Progress with prone on elbows to prone extensions, soft tissue/modalities as needed. Glute bridges when not painful.    PT Home Exercise Plan Prone on elbows,  supine hip piriformis stretching.    Consulted and Agree with Plan of Care Patient      Patient will benefit from skilled therapeutic intervention in order to improve the following deficits and impairments:  Pain, Postural dysfunction, Decreased strength, Decreased activity tolerance, Decreased endurance, Decreased balance, Difficulty walking  Visit Diagnosis: Acute right-sided low back pain with right-sided sciatica  Difficulty in walking, not elsewhere classified      G-Codes - 2017-03-27 1457    Functional Assessment Tool Used (Outpatient Only) Modified ODI, Patient report    Functional Limitation Mobility: Walking and moving around   Mobility: Walking and Moving Around Current Status [phone]) At least 40 percent but less than 60 percent impaired, limited or restricted   Mobility: Walking and Moving Around Goal Status [phone]) At least 1 percent but less than 20 percent impaired, limited or restricted       Problem List Patient Active Problem List   Diagnosis Date Noted  . Substance induced mood disorder (HCC) 05/19/2016  . Severe recurrent major depression without psychotic features (HCC) 05/19/2016  . Suicidal ideation 05/19/2016  . HTN (hypertension) 05/19/2016  . GERD (gastroesophageal reflux disease) 05/19/2016  . Alcohol use disorder, mild, abuse 05/19/2016   Alva Garnet PT, DPT, CSCS      Mar 27, 2017, 3:05 PM  [facility] [address] [place], [address] Phone: [phone]   Fax:  [phone]  Name: Todd [name] MRN: [id number] Date of Birth: May 11, 1966

## 2017-03-12 ENCOUNTER — Ambulatory Visit: Payer: Medicare Other | Admitting: Physical Therapy

## 2017-03-12 DIAGNOSIS — E1165 Type 2 diabetes mellitus with hyperglycemia: Secondary | ICD-10-CM

## 2017-03-12 HISTORY — DX: Type 2 diabetes mellitus with hyperglycemia: E11.65

## 2017-03-16 ENCOUNTER — Ambulatory Visit: Payer: Medicare Other | Attending: Orthopedic Surgery | Admitting: Physical Therapy

## 2017-03-16 DIAGNOSIS — M5441 Lumbago with sciatica, right side: Secondary | ICD-10-CM | POA: Insufficient documentation

## 2017-03-16 DIAGNOSIS — R262 Difficulty in walking, not elsewhere classified: Secondary | ICD-10-CM | POA: Insufficient documentation

## 2017-03-16 NOTE — Therapy (Signed)
[facility] [address] [place], [address] Phone: [phone]   Fax:  [phone]  Physical Therapy Treatment  Patient Details  Name: Todd [name] MRN: [id number] Date of Birth: 12-Oct-1966 Referring Provider: Dr. Ivin Poot  Encounter Date: 03/16/2017      PT End of Session - 03/16/17 1446    Visit Number 2   Number of Visits 13   Date for PT Re-Evaluation 05/05/17   PT Start Time 1437   PT Stop Time 1515   PT Time Calculation (min) 38 min   Activity Tolerance Patient tolerated treatment well;Patient limited by pain   Behavior During Therapy [facility] for tasks assessed/performed      Past Medical History:  Diagnosis Date  . Anxiety   . Depression   . Diabetes mellitus without complication (HCC)   . GERD (gastroesophageal reflux disease)   . Hypertension   . Sleep apnea     Past Surgical History:  Procedure Laterality Date  . LEG AMPUTATION BELOW KNEE     left  . SHOULDER SURGERY      There were no vitals filed for this visit.      Subjective Assessment - 03/16/17 1443    Subjective Patient reports his HEP has been helping in the moment but continues to be hurting most days. He sees his MD in the next day or two. He still has pain coming down his R leg into his calf sporadically.    Limitations Sitting;Lifting;Standing;Walking;House hold activities   Diagnostic tests MRI report - Question bilateral L5 pars defects. There is grade 1 anterolisthesis of L5 on S1. Multilevel disc space narrowing, most severe at L5-S1. There is also multilevel endplate sclerosis/osteophytosis and facet arthrosis. Vertebral body heights are maintained.   Patient Stated Goals To have less pain in his back.    Currently in Pain? Yes   Pain Score --  Patient reports pain as moderate in lumbar spine.    Pain Location Back   Pain Orientation Lower   Pain Descriptors / Indicators Aching   Pain Type Chronic pain   Pain Onset  More than a month ago   Pain Frequency Intermittent   Aggravating Factors  Sitting in the wrong position, standing for prolonged times.    Pain Relieving Factors Changing sitting position, prone extension for lumbar spine.       High volt stimulation to lumbar paraspinals 125V bilaterally x 15 minutes -- well tolerated, reported improved symptoms while laying down.   Soft tissue mobilization in prone to gluteals, lumbar extensors. Patient reports areas of discomfort in gluteal musculature mid belly and throughout lumbar extensors R>L. No focal trigger points identified, but generalized tenderness. Reports decrease in these symptoms with soft tissue mobilzation, though he continues to have pain when not flexed in standing/gait, increasing as he gets closer to extension from neutral.                              PT Education - 03/16/17 1446    Education provided Yes   Education Details Will continue to progress tolerance for more extended or neutral positions.    Person(s) Educated Patient   Methods Explanation;Demonstration   Comprehension Verbalized understanding;Returned demonstration             PT Long Term Goals - 03/10/17 1453      PT LONG TERM GOAL #1   Title Patient will report  mODI of less than 30% to demonstrate improved tolerance for ADLs.    Baseline 42%   Time 8   Period Weeks   Status New     PT LONG TERM GOAL #2   Title Patient will perform full AROM of lumbar spine with no radiating pain or increase in lumbar pain to demonstrate improved ROM and tolerance for ADLs.    Baseline Severe pain that radiates with extension, milder with rotations.    Time 8   Period Weeks   Status New     PT LONG TERM GOAL #3   Title Patient will be able to stand for at least 30 minutes with no increase in symptoms to return to ADLs.    Baseline Pain with prolonged time (greater than 15 minutes) in any 1 position.    Time 8   Period Weeks   Status New                Plan - 03/16/17 1447    Clinical Impression Statement Patient reports he will be seeing his doctor this weekend, given the findings of the imaging it is likely he has some neurologic impingement given his sciatic complaints with being upright. He would benefit from continued soft tissue mobilization/estim for pain control until more definitive diagnsosis is provided this week.    Clinical Presentation Evolving   Clinical Decision Making High   Rehab Potential Fair   Clinical Impairments Affecting Rehab Potential Noted to have mass on spinal cord on MRI, possible surgical candidate, follow up June 7. Weight loss due to GI issues.   PT Frequency 2x / week   PT Duration 6 weeks   PT Treatment/Interventions Passive range of motion;Therapeutic exercise;Therapeutic activities;Balance training;Taping;Dry needling;Manual techniques;Ultrasound;Moist Heat;Iontophoresis 4mg /ml Dexamethasone;Cryotherapy;Electrical Stimulation;ADLs/Self Care Home Management   PT Next Visit Plan Progress with prone on elbows to prone extensions, soft tissue/modalities as needed. Glute bridges when not painful.    PT Home Exercise Plan Prone on elbows, supine hip piriformis stretching.    Consulted and Agree with Plan of Care Patient      Patient will benefit from skilled therapeutic intervention in order to improve the following deficits and impairments:  Pain, Postural dysfunction, Decreased strength, Decreased activity tolerance, Decreased endurance, Decreased balance, Difficulty walking  Visit Diagnosis: Acute right-sided low back pain with right-sided sciatica  Difficulty in walking, not elsewhere classified     Problem List Patient Active Problem List   Diagnosis Date Noted  . Substance induced mood disorder (HCC) 05/19/2016  . Severe recurrent major depression without psychotic features (HCC) 05/19/2016  . Suicidal ideation 05/19/2016  . HTN (hypertension) 05/19/2016  . GERD  (gastroesophageal reflux disease) 05/19/2016  . Alcohol use disorder, mild, abuse 05/19/2016   Alva GarnetPatrick Erminie Foulks PT, DPT, CSCS    03/16/2017, 4:44 PM  [facility] [facility] [address] [place], [place], [address] Phone: [phone]   Fax:  [phone]  Name: [name] Maguire MRN: [id number] Date of Birth: 03/16/1966

## 2017-03-16 NOTE — Patient Instructions (Addendum)
High volt stimulation to lumbar paraspinals 125V bilaterally   Soft tissue mobilization in prone to gluteals, lumbar extensors.

## 2017-03-19 ENCOUNTER — Encounter: Payer: Self-pay | Admitting: Physical Therapy

## 2017-03-24 ENCOUNTER — Ambulatory Visit: Payer: Medicare Other | Admitting: Physical Therapy

## 2017-03-26 ENCOUNTER — Ambulatory Visit: Payer: Medicare Other | Admitting: Physical Therapy

## 2017-03-30 ENCOUNTER — Ambulatory Visit: Payer: Medicare Other | Admitting: Physical Therapy

## 2017-04-05 ENCOUNTER — Encounter: Payer: Medicare Other | Admitting: Physical Therapy

## 2017-04-06 ENCOUNTER — Encounter: Payer: Self-pay | Admitting: Physical Therapy

## 2017-04-21 ENCOUNTER — Ambulatory Visit: Admit: 2017-04-21 | Payer: Medicare Other | Admitting: Unknown Physician Specialty

## 2017-04-21 SURGERY — COLONOSCOPY WITH PROPOFOL
Anesthesia: General

## 2017-05-14 ENCOUNTER — Telehealth (HOSPITAL_COMMUNITY): Payer: Self-pay | Admitting: Psychiatry

## 2017-05-28 ENCOUNTER — Emergency Department
Admission: EM | Admit: 2017-05-28 | Discharge: 2017-05-28 | Disposition: A | Discharge status: Home / Self Care | Payer: Medicare Other | Attending: Student in an Organized Health Care Education/Training Program | Admitting: Student in an Organized Health Care Education/Training Program

## 2017-05-28 ENCOUNTER — Emergency Department: Payer: Medicare Other

## 2017-05-28 DIAGNOSIS — E119 Type 2 diabetes mellitus without complications: Secondary | ICD-10-CM | POA: Diagnosis not present

## 2017-05-28 DIAGNOSIS — R079 Chest pain, unspecified: Secondary | ICD-10-CM | POA: Insufficient documentation

## 2017-05-28 DIAGNOSIS — Z89512 Acquired absence of left leg below knee: Secondary | ICD-10-CM | POA: Diagnosis not present

## 2017-05-28 DIAGNOSIS — I10 Essential (primary) hypertension: Secondary | ICD-10-CM | POA: Insufficient documentation

## 2017-05-28 DIAGNOSIS — F1210 Cannabis abuse, uncomplicated: Secondary | ICD-10-CM | POA: Diagnosis not present

## 2017-05-28 DIAGNOSIS — F418 Other specified anxiety disorders: Secondary | ICD-10-CM | POA: Insufficient documentation

## 2017-05-28 DIAGNOSIS — F410 Panic disorder [episodic paroxysmal anxiety] without agoraphobia: Secondary | ICD-10-CM | POA: Diagnosis not present

## 2017-05-28 LAB — BASIC METABOLIC PANEL
ANION GAP: 10 (ref 5–15)
BUN: 17 mg/dL (ref 6–20)
CO2: 25 mmol/L (ref 22–32)
CREATININE: 1.27 mg/dL — AB (ref 0.61–1.24)
Calcium: 9.5 mg/dL (ref 8.9–10.3)
Chloride: 105 mmol/L (ref 101–111)
GFR calc Af Amer: >60 mL/min (ref >60)
GLUCOSE: 237 mg/dL — AB (ref 65–99)
Potassium: 3.4 mmol/L — ABNORMAL LOW (ref 3.5–5.1)
Sodium: 140 mmol/L (ref 135–145)

## 2017-05-28 LAB — TROPONIN I: Troponin I: <0.03 ng/mL (ref <0.03)

## 2017-05-28 MED ORDER — LORAZEPAM 0.5 MG PO TABS
0.5000 mg | ORAL_TABLET | Freq: Once | ORAL | Status: AC
  Administered 2017-05-28: 0.5 mg via ORAL
  Filled 2017-05-28: qty 1

## 2017-05-28 NOTE — ED Triage Notes (Signed)
Pt presents via EMS c/o chest pain and SOB. Pt reports hx of PTSD with intermittent panic attacks. Reports using marijuana PTA for PTSD symptoms without relief. Pt states panic attacks worsening lately.

## 2017-05-28 NOTE — ED Provider Notes (Signed)
[facility] Emergency Department Provider Note    First MD Initiated Contact with Patient 05/28/17 2142     (approximate)  I have reviewed the triage vital signs and the nursing notes.   HISTORY  Chief Complaint Chest Pain    HPI Todd [name] is a 51 y.o. male chief complaint of chest pain and anxiety after smoking marijuana today. Patient does have a history of PTSD and anxiety and depression and states he has been tired of taking depression pills so he wanted to try marijuana for the first time. States that within 30 seconds ascending bodies are feeling overwhelming sense of doom and pressure and shortness of breath. He then called EMS. By the time he is arriving in the ER since his shortness of breath and chest pain had resolved.  He states that over the past several weeks he has been having some passive suicidal thoughts but is not having any suicidal ideation at this time. No homicidal ideation. Denies any other substance abuse. Denies any other discomfort.   Past Medical History:  Diagnosis Date  . Anxiety   . Depression   . Diabetes mellitus without complication (HCC)   . GERD (gastroesophageal reflux disease)   . Hypertension   . Sleep apnea    History reviewed. No pertinent family history. Past Surgical History:  Procedure Laterality Date  . LEG AMPUTATION BELOW KNEE     left  . SHOULDER SURGERY     Patient Active Problem List   Diagnosis Date Noted  . Substance induced mood disorder (HCC) 05/19/2016  . Severe recurrent major depression without psychotic features (HCC) 05/19/2016  . Suicidal ideation 05/19/2016  . HTN (hypertension) 05/19/2016  . GERD (gastroesophageal reflux disease) 05/19/2016  . Alcohol use disorder, mild, abuse 05/19/2016      Prior to Admission medications   Medication Sig Start Date End Date Taking? Authorizing Provider  lisinopril (PRINIVIL,ZESTRIL) 40 MG tablet Take 1 tablet (40 mg total) by mouth daily.  05/20/16   Pucilowska, Braulio Conte B, MD  metoCLOPramide (REGLAN) 10 MG tablet Take 1 tablet (10 mg total) by mouth 4 (four) times daily -  before meals and at bedtime. 01/02/16   Sharman Cheek, MD  ondansetron (ZOFRAN) 4 MG tablet Take 1 tablet (4 mg total) by mouth daily as needed for nausea or vomiting. 07/30/16   Emily Filbert, MD  ranitidine (ZANTAC) 150 MG capsule Take 1 capsule (150 mg total) by mouth 2 (two) times daily. 01/02/16   Sharman Cheek, MD  sucralfate (CARAFATE) 1 g tablet Take 1 tablet (1 g total) by mouth 4 (four) times daily. 01/02/16   Sharman Cheek, MD  zolpidem (AMBIEN) 10 MG tablet Take 1 tablet (10 mg total) by mouth at bedtime as needed for sleep. 05/20/16   Pucilowska, Ellin Goodie, MD    Allergies Benadryl [diphenhydramine]    Social History Social History  Substance Use Topics  . Smoking status: Never Smoker  . Smokeless tobacco: Never Used  . Alcohol use No    Review of Systems Patient denies headaches, rhinorrhea, blurry vision, numbness, shortness of breath, chest pain, edema, cough, abdominal pain, nausea, vomiting, diarrhea, dysuria, fevers, rashes or hallucinations unless otherwise stated above in HPI. ____________________________________________   PHYSICAL EXAM:  VITAL SIGNS: Vitals:   05/28/17 2200 05/28/17 2257  BP: 117/80 (!) 120/92  Pulse: 97 95  Resp: 16 17  Temp:    SpO2: 94% 95%    Constitutional: Alert and oriented. Well appearing and in  no acute distress. Eyes: Conjunctivae are normal. Pupils 4mm and reactive Head: Atraumatic. Nose: No congestion/rhinnorhea. Mouth/Throat: Mucous membranes are moist.   Neck: No stridor. Painless ROM.  Cardiovascular: Normal rate, regular rhythm. Grossly normal heart sounds.  Good peripheral circulation. Respiratory: Normal respiratory effort.  No retractions. Lungs CTAB. Gastrointestinal: Soft and nontender. No distention. No abdominal bruits. No CVA tenderness. Genitourinary:    Musculoskeletal: No lower extremity tenderness nor edema.  No joint effusions. Neurologic:  Normal speech and language. No gross focal neurologic deficits are appreciated. No facial droop Skin:  Skin is warm, dry and intact. No rash noted. Psychiatric:mildly anxious appearing, states he feels depressed but no SI or HI  ____________________________________________   LABS (all labs ordered are listed, but only abnormal results are displayed)  Results for orders placed or performed during the hospital encounter of 05/28/17 (from the past 24 hour(s))  Basic metabolic panel     Status: Abnormal   Collection Time: 05/28/17  9:56 PM  Result Value Ref Range   Sodium 140 135 - 145 mmol/L   Potassium 3.4 (L) 3.5 - 5.1 mmol/L   Chloride 105 101 - 111 mmol/L   CO2 25 22 - 32 mmol/L   Glucose, Bld 237 (H) 65 - 99 mg/dL   BUN 17 6 - 20 mg/dL   Creatinine, Ser 1.61 (H) 0.61 - 1.24 mg/dL   Calcium 9.5 8.9 - 09.6 mg/dL   GFR calc non Af Amer >60 >60 mL/min   GFR calc Af Amer >60 >60 mL/min   Anion gap 10 5 - 15  Troponin I     Status: None   Collection Time: 05/28/17  9:56 PM  Result Value Ref Range   Troponin I <0.03 <0.03 ng/mL   ____________________________________________  EKG My review and personal interpretation at Time: 21:11   Indication: chest pain  Rate: 105  Rhythm: sinus Axis: normal Other: normal intervals, no stemi ____________________________________________  RADIOLOGY  I personally reviewed all radiographic images ordered to evaluate for the above acute complaints and reviewed radiology reports and findings.  These findings were personally discussed with the patient.  Please see medical record for radiology report.  ____________________________________________   PROCEDURES  Procedure(s) performed:  Procedures    Critical Care performed: no ____________________________________________   INITIAL IMPRESSION / ASSESSMENT AND PLAN / ED COURSE  Pertinent labs &  imaging results that were available during my care of the patient were reviewed by me and considered in my medical decision making (see chart for details).  DDX: acs, copd, ptx, pna, anxiety  Todd [name] is a 51 y.o. who presents to the ED with panic attack as described above. Seems to be secondary to marijuana use.  Is not consistent with ACS. He has no ongoing chest pain or shortness of breath. EKG and troponin without any evidence of ischemia. Chest x-ray is reassuring. Abdominal exam is soft and benign. Patient denies any SI or HI. Has been dealing with depression for quite some time and states he has good understanding of his triggers. Patient is followed by outpatient psychiatry. At this point do not see any indication for involuntary commitment. Despite using marijuana seems to have good insight into his illness and his presentation today. He has good family support and do believe that he is stable and appropriate for further workup as an outpatient.  Clinical Course as of May 29 36  Fri May 28, 2017  2142 Troponin I [PR]    Clinical Course User Index [PR] Roxan Hockey,  Luisa Hart, MD     ____________________________________________   FINAL CLINICAL IMPRESSION(S) / ED DIAGNOSES  Final diagnoses:  Chest pain, unspecified type  Marijuana abuse  Panic      NEW MEDICATIONS STARTED DURING THIS VISIT:  Discharge Medication List as of 05/28/2017 10:44 PM       Note:  This document was prepared using Dragon voice recognition software and may include unintentional dictation errors.    Willy Eddy, MD 05/29/17 [phone]

## 2017-05-28 NOTE — ED Notes (Signed)
Portable xray at bedside.

## 2017-05-28 NOTE — ED Notes (Signed)
Recollected lab work sent to lab.

## 2017-05-28 NOTE — ED Notes (Signed)
Pt states chest pain and SOB has resolved at this time.  Pt up to use restroom, urine sample collected.

## 2017-06-08 DIAGNOSIS — Z91199 Patient's noncompliance with other medical treatment and regimen due to unspecified reason: Secondary | ICD-10-CM | POA: Insufficient documentation

## 2017-06-08 DIAGNOSIS — Z899 Acquired absence of limb, unspecified: Secondary | ICD-10-CM | POA: Insufficient documentation

## 2017-06-08 DIAGNOSIS — K3184 Gastroparesis: Secondary | ICD-10-CM

## 2017-06-08 DIAGNOSIS — Z9119 Patient's noncompliance with other medical treatment and regimen: Secondary | ICD-10-CM | POA: Insufficient documentation

## 2017-06-08 DIAGNOSIS — E669 Obesity, unspecified: Secondary | ICD-10-CM

## 2017-06-08 DIAGNOSIS — E118 Type 2 diabetes mellitus with unspecified complications: Secondary | ICD-10-CM

## 2017-06-08 DIAGNOSIS — E1169 Type 2 diabetes mellitus with other specified complication: Secondary | ICD-10-CM | POA: Insufficient documentation

## 2017-06-08 HISTORY — DX: Type 2 diabetes mellitus with unspecified complications: E11.8

## 2017-06-08 HISTORY — DX: Gastroparesis: K31.84

## 2017-07-06 ENCOUNTER — Emergency Department
Admission: EM | Admit: 2017-07-06 | Discharge: 2017-07-06 | Disposition: A | Discharge status: Home / Self Care | Payer: Medicare Other | Attending: Student in an Organized Health Care Education/Training Program | Admitting: Student in an Organized Health Care Education/Training Program

## 2017-07-06 ENCOUNTER — Emergency Department: Payer: Medicare Other

## 2017-07-06 DIAGNOSIS — E119 Type 2 diabetes mellitus without complications: Secondary | ICD-10-CM | POA: Insufficient documentation

## 2017-07-06 DIAGNOSIS — R079 Chest pain, unspecified: Secondary | ICD-10-CM | POA: Insufficient documentation

## 2017-07-06 DIAGNOSIS — R202 Paresthesia of skin: Secondary | ICD-10-CM | POA: Diagnosis not present

## 2017-07-06 DIAGNOSIS — Z79899 Other long term (current) drug therapy: Secondary | ICD-10-CM | POA: Diagnosis not present

## 2017-07-06 DIAGNOSIS — I10 Essential (primary) hypertension: Secondary | ICD-10-CM | POA: Insufficient documentation

## 2017-07-06 DIAGNOSIS — R200 Anesthesia of skin: Secondary | ICD-10-CM | POA: Diagnosis present

## 2017-07-06 LAB — CBC
HEMATOCRIT: 47.2 % (ref 40.0–52.0)
Hemoglobin: 16.3 g/dL (ref 13.0–18.0)
MCH: 28.9 pg (ref 26.0–34.0)
MCHC: 34.6 g/dL (ref 32.0–36.0)
MCV: 83.4 fL (ref 80.0–100.0)
Platelets: 216 10*3/uL (ref 150–440)
RBC: 5.66 MIL/uL (ref 4.40–5.90)
RDW: 13.5 % (ref 11.5–14.5)
WBC: 9.3 10*3/uL (ref 3.8–10.6)

## 2017-07-06 LAB — BASIC METABOLIC PANEL
ANION GAP: 10 (ref 5–15)
BUN: 16 mg/dL (ref 6–20)
CALCIUM: 9.5 mg/dL (ref 8.9–10.3)
CHLORIDE: 104 mmol/L (ref 101–111)
CO2: 27 mmol/L (ref 22–32)
CREATININE: 1.26 mg/dL — AB (ref 0.61–1.24)
GFR calc non Af Amer: >60 mL/min (ref >60)
Glucose, Bld: 147 mg/dL — ABNORMAL HIGH (ref 65–99)
Potassium: 3.9 mmol/L (ref 3.5–5.1)
SODIUM: 141 mmol/L (ref 135–145)

## 2017-07-06 LAB — TROPONIN I: Troponin I: <0.03 ng/mL (ref <0.03)

## 2017-07-06 MED ORDER — LORAZEPAM 2 MG/ML IJ SOLN
1.0000 mg | Freq: Once | INTRAMUSCULAR | Status: AC
Start: 2017-07-06 — End: 2017-07-06
  Administered 2017-07-06: 1 mg via INTRAVENOUS
  Filled 2017-07-06: qty 1

## 2017-07-06 MED ORDER — IOPAMIDOL (ISOVUE-370) INJECTION 76%
100.0000 mL | Freq: Once | INTRAVENOUS | Status: AC | PRN
  Administered 2017-07-06: 100 mL via INTRAVENOUS

## 2017-07-06 NOTE — ED Triage Notes (Signed)
Patient with facial numbness and tongue numbness with tingling along the right side of the face with onset at 1500 this afternoon. Patient with slight asymmetry in smile but no slurred speech is noted. Able raise eyebrows.

## 2017-07-06 NOTE — ED Provider Notes (Signed)
[facility] Emergency Department Provider Note    First MD Initiated Contact with Patient 07/06/17 2039     (approximate)  I have reviewed the triage vital signs and the nursing notes.   HISTORY  Chief Complaint Numbness and Tingling    HPI Todd [name] is a 51 y.o. male history of anxiety, depression, PTSD and hypertension presents with chief complaint of chest pain and shortness of breath with pain radiating through to his back that started early this morning and then developed right sided tingling in his face. States that he's had similar symptoms when he's had panic attacks but as recently started new antipsychotic medications that he feels are making a lot of his side effects worse. Eyes any other numbness or tingling. No recent fevers. No cough. No nausea or vomiting. No recent stressors.   Past Medical History:  Diagnosis Date  . Anxiety   . Depression   . Diabetes mellitus without complication (HCC)   . GERD (gastroesophageal reflux disease)   . Hypertension   . Sleep apnea    No family history on file. Past Surgical History:  Procedure Laterality Date  . LEG AMPUTATION BELOW KNEE     left  . SHOULDER SURGERY     Patient Active Problem List   Diagnosis Date Noted  . Substance induced mood disorder (HCC) 05/19/2016  . Severe recurrent major depression without psychotic features (HCC) 05/19/2016  . Suicidal ideation 05/19/2016  . HTN (hypertension) 05/19/2016  . GERD (gastroesophageal reflux disease) 05/19/2016  . Alcohol use disorder, mild, abuse 05/19/2016      Prior to Admission medications   Medication Sig Start Date End Date Taking? Authorizing Provider  lisinopril (PRINIVIL,ZESTRIL) 40 MG tablet Take 1 tablet (40 mg total) by mouth daily. 05/20/16   Pucilowska, Braulio Conte B, MD  metoCLOPramide (REGLAN) 10 MG tablet Take 1 tablet (10 mg total) by mouth 4 (four) times daily -  before meals and at bedtime. 01/02/16   Sharman Cheek,  MD  ondansetron (ZOFRAN) 4 MG tablet Take 1 tablet (4 mg total) by mouth daily as needed for nausea or vomiting. 07/30/16   Emily Filbert, MD  ranitidine (ZANTAC) 150 MG capsule Take 1 capsule (150 mg total) by mouth 2 (two) times daily. 01/02/16   Sharman Cheek, MD  sucralfate (CARAFATE) 1 g tablet Take 1 tablet (1 g total) by mouth 4 (four) times daily. 01/02/16   Sharman Cheek, MD  zolpidem (AMBIEN) 10 MG tablet Take 1 tablet (10 mg total) by mouth at bedtime as needed for sleep. 05/20/16   Pucilowska, Ellin Goodie, MD    Allergies Benadryl [diphenhydramine]    Social History Social History  Substance Use Topics  . Smoking status: Never Smoker  . Smokeless tobacco: Never Used  . Alcohol use No    Review of Systems Patient denies headaches, rhinorrhea, blurry vision, numbness, shortness of breath, chest pain, edema, cough, abdominal pain, nausea, vomiting, diarrhea, dysuria, fevers, rashes or hallucinations unless otherwise stated above in HPI. ____________________________________________   PHYSICAL EXAM:  VITAL SIGNS: Vitals:   07/06/17 2055 07/06/17 2100  BP:  125/81  Pulse:  73  Resp:  16  Temp: 98.1 F (36.7 C)   SpO2:  94%    Constitutional: Alert and oriented. Well appearing and in no acute distress. Eyes: Conjunctivae are normal.  Head: Atraumatic. Nose: No congestion/rhinnorhea. Mouth/Throat: Mucous membranes are moist.   Neck: No stridor. Painless ROM.  Cardiovascular: Normal rate, regular rhythm. Grossly normal heart  sounds.  Good peripheral circulation. Respiratory: Normal respiratory effort.  No retractions. Lungs CTAB. Gastrointestinal: Soft and nontender. No distention. No abdominal bruits. No CVA tenderness. Musculoskeletal: No lower extremity tenderness, trace edema to RLE, sp left aka Neurologic:  CN- intact.  No facial droop, Normal FNF.  Normal heel to shin.  Sensation intact bilaterally. Normal speech and language. No gross focal  neurologic deficits are appreciated. No gait instability.  Skin:  Skin is warm, dry and intact. No rash noted. Psychiatric: anxious appearing, no SI or HI ____________________________________________   LABS (all labs ordered are listed, but only abnormal results are displayed)  Results for orders placed or performed during the hospital encounter of 07/06/17 (from the past 24 hour(s))  CBC     Status: None   Collection Time: 07/06/17  8:31 PM  Result Value Ref Range   WBC 9.3 3.8 - 10.6 K/uL   RBC 5.66 4.40 - 5.90 MIL/uL   Hemoglobin 16.3 13.0 - 18.0 g/dL   HCT 16.1 09.6 - 04.5 %   MCV 83.4 80.0 - 100.0 fL   MCH 28.9 26.0 - 34.0 pg   MCHC 34.6 32.0 - 36.0 g/dL   RDW 40.9 81.1 - 91.4 %   Platelets 216 150 - 440 K/uL  Troponin I     Status: None   Collection Time: 07/06/17  8:31 PM  Result Value Ref Range   Troponin I <0.03 <0.03 ng/mL  Basic metabolic panel     Status: Abnormal   Collection Time: 07/06/17  8:31 PM  Result Value Ref Range   Sodium 141 135 - 145 mmol/L   Potassium 3.9 3.5 - 5.1 mmol/L   Chloride 104 101 - 111 mmol/L   CO2 27 22 - 32 mmol/L   Glucose, Bld 147 (H) 65 - 99 mg/dL   BUN 16 6 - 20 mg/dL   Creatinine, Ser 7.82 (H) 0.61 - 1.24 mg/dL   Calcium 9.5 8.9 - 95.6 mg/dL   GFR calc non Af Amer >60 >60 mL/min   GFR calc Af Amer >60 >60 mL/min   Anion gap 10 5 - 15   ____________________________________________  EKG My review and personal interpretation at Time: 20:17   Indication: chest pain  Rate: 70  Rhythm: sinus Axis: normal Other: normal intervals, no stemi, non specific changes ____________________________________________  RADIOLOGY  I personally reviewed all radiographic images ordered to evaluate for the above acute complaints and reviewed radiology reports and findings.  These findings were personally discussed with the patient.  Please see medical record for radiology  report.  ____________________________________________   PROCEDURES  Procedure(s) performed:  Procedures    Critical Care performed: no ____________________________________________   INITIAL IMPRESSION / ASSESSMENT AND PLAN / ED COURSE  Pertinent labs & imaging results that were available during my care of the patient were reviewed by me and considered in my medical decision making (see chart for details).  DDX: anxiety, acs, dissection, cva, electrolyte abn  Haskel Dewalt is a 51 y.o. who presents to the ED with chest pain and tingling as described above. EKG shows no evidence of acute ischemia and symptoms are quite atypical. Troponin is negative. No evidence of infectious process. Patient taken to CT scan due to concern for CVA however patient without any focal deficits and no facial droop just describes tingling in the right side of face which he states he's had previously with other panic attacks. We'll give dose of Ativan to assess for improvement in symptoms. We will order  CT angiogram as the patient describes pain radiating through to his back to ensure there is no evidence of aneurysm or dissection.  Will monitor on telemetry and reassess  Clinical Course as of Jul 06 2242  Tue Jul 06, 2017  2230 patient states his symptoms have resolved after Ativan. Currently awaiting results of CT angiogram. Patient without any chest pain or pressure.  [PR]    Clinical Course User Index [PR] Willy Eddy, MD   CT angiogram is reassuring. Patient without any chest pain, shortness of breath or paresthesias. This pointed to believe this is most consistent with anxiety attack. Do feel the patient is stable and appropriate for outpatient follow-up.  ____________________________________________   FINAL CLINICAL IMPRESSION(S) / ED DIAGNOSES  Final diagnoses:  Chest pain, unspecified type  Tingling of face      NEW MEDICATIONS STARTED DURING THIS VISIT:  New Prescriptions   No  medications on file     Note:  This document was prepared using Dragon voice recognition software and may include unintentional dictation errors.    Willy Eddy, MD 07/06/17 2245

## 2017-07-06 NOTE — ED Notes (Signed)
Pt returned from CT °

## 2017-07-06 NOTE — ED Notes (Signed)
Pt went to CT

## 2017-07-15 ENCOUNTER — Ambulatory Visit: Payer: Medicare Other | Attending: Orthopedic Surgery | Admitting: Physical Therapy

## 2017-07-15 DIAGNOSIS — M25512 Pain in left shoulder: Secondary | ICD-10-CM | POA: Diagnosis present

## 2017-07-15 DIAGNOSIS — Z9889 Other specified postprocedural states: Secondary | ICD-10-CM | POA: Diagnosis present

## 2017-07-15 DIAGNOSIS — G8929 Other chronic pain: Secondary | ICD-10-CM

## 2017-07-15 NOTE — Therapy (Signed)
[facility] [address] [place], [address] Phone: [phone]   Fax:  [phone]  Physical Therapy Evaluation  Patient Details  Name: Todd [name] MRN: [id number] Date of Birth: December 25, 1965 Referring Provider: Dr. Ivin Poot  Encounter Date: 07/15/2017      PT End of Session - 07/15/17 1514    Visit Number 1   Number of Visits 9   PT Start Time 1105   PT Stop Time 1155   PT Time Calculation (min) 50 min   Activity Tolerance Patient tolerated treatment well   Behavior During Therapy [facility] for tasks assessed/performed      Past Medical History:  Diagnosis Date  . Anxiety   . Depression   . Diabetes mellitus without complication (HCC)   . GERD (gastroesophageal reflux disease)   . Hypertension   . Sleep apnea     Past Surgical History:  Procedure Laterality Date  . LEG AMPUTATION BELOW KNEE     left  . SHOULDER SURGERY      There were no vitals filed for this visit.       Subjective Assessment - 07/15/17 1113    Subjective Patient reports since he last saw this author, he has not had the low back surgery due to excessively high blood sugars, which has improved. He has also been committed due to suicidal ideations, reports he is still having a lot of side effects from medications like constipation, trouble sleeping. Reports he is getting clicking/popping in his shoulder with overhead flexion. He can punch forward with no pain, his MD does not think there is any defect in the rotator cuff. 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    Limitations Sitting;Lifting;Standing;Walking;House hold activities   Diagnostic tests MRI report - Question bilateral L5 pars defects. There is grade 1 anterolisthesis of L5 on S1. Multilevel disc space narrowing, most severe at L5-S1. There is also multilevel endplate  sclerosis/osteophytosis and facet arthrosis. Vertebral body heights are maintained.   Patient Stated Goals To get healthier and be able to return to work.          Cervical exam AROM - discomfort with flexion only Overpressure- felt good in flexion and extension , no added discomfort    AROM - abduction to 155 painful at end range   AROM flexion to 165 degrees, painful at end range   ER PROM - 76 degrees  IR PROM - 74 degrees   MMT ER- 5/5 but painful  IR- 5/5  ABduction - 5/5   TherEx Soft tissue mobilization to long head of biceps tendon and middle deltoid -patient reported no pain with flexion after completion aside from dull at end range  Prone horizontal abduction with ER with 2# DB x 12 for 3 sets  Prone LT raises at 135 degrees of abduction with 1# DB x 12 repetitions for 3 sets         Objective measurements completed on examination: See above findings.                  PT Education - 07/15/17 1513    Education provided Yes   Education Details Given his presentation and recent medical history he would likely do best to come in x2 per week to work on posterior scapular musculature.    Person(s) Educated Patient  Methods Explanation;Demonstration   Comprehension Verbalized understanding;Returned demonstration             PT Long Term Goals - 08/05/17 1518      PT LONG TERM GOAL #1   Title Patient will report QuickDash score of less than 2% to demonstrate improved tolerance for ADLs.    Time 4   Period Weeks   Status New   Target Date 08/12/17     PT LONG TERM GOAL #2   Title Patient will complete mechanical duties at work with no increase in pain to demonstrate improvedtolerance for work related activities.    Time 4   Period Weeks   Status New   Target Date 08/12/17                Plan - 05-Aug-2017 1514    Clinical Impression Statement Patient was discharged in the spring following rehab for a L rotator cuff repair with  good ROM and fair pain control. He has a complex medical history, after traumatic LE amputation as a youth. He has had issues with blood sugar control, sleep disturbances, recent psychiatric admission, and pending lumbar surgery. He has excellent strength and ROM about his shoulder, however he was noted to have some irritation around the biceps long head tendon, which was relieved with soft tissue mobilization and likely has poor muscle strength/endurance of lower trapezius and middle trapezius given presentation today. Patient will likely benefit from skilled PT services to address the listed deficits.    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 Weight loss due to GI issues.   PT Frequency 2x / week   PT Duration 4 weeks   PT Treatment/Interventions Passive range of motion;Therapeutic exercise;Therapeutic activities;Balance training;Taping;Dry needling;Manual techniques;Ultrasound;Moist Heat;Iontophoresis /ml Dexamethasone;Cryotherapy;Electrical Stimulation;ADLs/Self Care Home Management   PT Next Visit Plan Work on posterior scapular strength (LT/MT)   Consulted and Agree with Plan of Care Patient      Patient will benefit from skilled therapeutic intervention in order to improve the following deficits and impairments:  Impaired UE functional use, Pain, Decreased activity tolerance, Decreased strength  Visit Diagnosis: S/P left rotator cuff repair - Plan: PT plan of care cert/re-cert  Chronic left shoulder pain - Plan: PT plan of care cert/re-cert      G-Codes - 08/05/2017 1521    Functional Assessment Tool Used (Outpatient Only) QuickDash   Functional Limitation Carrying, moving and handling objects   Carrying, Moving and Handling Objects Current Status (Z6109) At least 1 percent but less than 20 percent impaired, limited  or restricted   Carrying, Moving and Handling Objects Goal Status (U0454) At least 1 percent but less than 20 percent impaired, limited or restricted       Problem List Patient Active Problem List   Diagnosis Date Noted  . Substance induced mood disorder (HCC) 05/19/2016  . Severe recurrent major depression without psychotic features (HCC) 05/19/2016  . Suicidal ideation 05/19/2016  . HTN (hypertension) 05/19/2016  . GERD (gastroesophageal reflux disease) 05/19/2016  . Alcohol use disorder, mild, abuse 05/19/2016    Alva Garnet PT, DPT, CSCS     08-05-2017, 3:23 PM  [facility] [facility] SPORTS MEDICINE [address], [place], [address] Phone: [phone]   Fax:  [phone]  Name: Gianni Mihalik MRN: [id number] Date of Birth: 05/05/1966

## 2017-07-15 NOTE — Patient Instructions (Signed)
Cervical exam AROM - discomfort with flexion only Overpressure- felt good in flexion and extension , no added discomfort    AROM - abduction to 155 painful at end range   AROM flexion to 165 degrees, painful at end range   ER PROM - 76 degrees  IR PROM - 74 degrees   MMT ER- 5/5 but painful  IR- 5/5  ABduction - 5/5   TherEx Soft tissue mobilization to long head of biceps tendon and middle deltoid  Prone horizontal abduction with ER with 2# DB x 12 for 3 sets  Prone LT raises at 135 degrees of abduction with 1# DB x 12 repetitions for 3 sets

## 2017-07-17 ENCOUNTER — Emergency Department: Payer: Medicare Other

## 2017-07-17 ENCOUNTER — Emergency Department
Admission: EM | Admit: 2017-07-17 | Discharge: 2017-07-18 | Disposition: A | Discharge status: Home / Self Care | Payer: Medicare Other | Attending: Emergency Medicine | Admitting: Emergency Medicine

## 2017-07-17 ENCOUNTER — Encounter: Payer: Self-pay | Admitting: Radiology

## 2017-07-17 DIAGNOSIS — E119 Type 2 diabetes mellitus without complications: Secondary | ICD-10-CM | POA: Diagnosis not present

## 2017-07-17 DIAGNOSIS — K5792 Diverticulitis of intestine, part unspecified, without perforation or abscess without bleeding: Secondary | ICD-10-CM | POA: Insufficient documentation

## 2017-07-17 DIAGNOSIS — R1030 Lower abdominal pain, unspecified: Secondary | ICD-10-CM | POA: Diagnosis present

## 2017-07-17 DIAGNOSIS — Z79899 Other long term (current) drug therapy: Secondary | ICD-10-CM | POA: Diagnosis not present

## 2017-07-17 DIAGNOSIS — I10 Essential (primary) hypertension: Secondary | ICD-10-CM | POA: Insufficient documentation

## 2017-07-17 LAB — COMPREHENSIVE METABOLIC PANEL
ALT: 81 U/L — ABNORMAL HIGH (ref 17–63)
ANION GAP: 11 (ref 5–15)
AST: 42 U/L — ABNORMAL HIGH (ref 15–41)
Albumin: 4.3 g/dL (ref 3.5–5.0)
Alkaline Phosphatase: 59 U/L (ref 38–126)
BILIRUBIN TOTAL: 0.9 mg/dL (ref 0.3–1.2)
BUN: 12 mg/dL (ref 6–20)
CHLORIDE: 103 mmol/L (ref 101–111)
CO2: 25 mmol/L (ref 22–32)
Calcium: 8.9 mg/dL (ref 8.9–10.3)
Creatinine, Ser: 1 mg/dL (ref 0.61–1.24)
Glucose, Bld: 121 mg/dL — ABNORMAL HIGH (ref 65–99)
POTASSIUM: 3.4 mmol/L — AB (ref 3.5–5.1)
Sodium: 139 mmol/L (ref 135–145)
TOTAL PROTEIN: 7.6 g/dL (ref 6.5–8.1)

## 2017-07-17 LAB — CBC
HEMATOCRIT: 46.1 % (ref 40.0–52.0)
Hemoglobin: 16.2 g/dL (ref 13.0–18.0)
MCH: 28.9 pg (ref 26.0–34.0)
MCHC: 35.2 g/dL (ref 32.0–36.0)
MCV: 82.1 fL (ref 80.0–100.0)
Platelets: 189 10*3/uL (ref 150–440)
RBC: 5.62 MIL/uL (ref 4.40–5.90)
RDW: 13.7 % (ref 11.5–14.5)
WBC: 12.4 10*3/uL — AB (ref 3.8–10.6)

## 2017-07-17 LAB — LIPASE, BLOOD: Lipase: 33 U/L (ref 11–51)

## 2017-07-17 MED ORDER — IOPAMIDOL (ISOVUE-300) INJECTION 61%
100.0000 mL | Freq: Once | INTRAVENOUS | Status: AC | PRN
  Administered 2017-07-17: 100 mL via INTRAVENOUS

## 2017-07-17 MED ORDER — AMOXICILLIN-POT CLAVULANATE 875-125 MG PO TABS
1.0000 | ORAL_TABLET | Freq: Once | ORAL | Status: AC
  Administered 2017-07-17: 1 via ORAL
  Filled 2017-07-17: qty 1

## 2017-07-17 MED ORDER — HYDROCODONE-ACETAMINOPHEN 5-325 MG PO TABS: 1.0000 | ORAL_TABLET | Freq: Four times a day (QID) | ORAL | 0 refills | Status: DC | PRN

## 2017-07-17 MED ORDER — METRONIDAZOLE 500 MG PO TABS
500.0000 mg | ORAL_TABLET | Freq: Once | ORAL | Status: AC
  Administered 2017-07-17: 500 mg via ORAL
  Filled 2017-07-17: qty 1

## 2017-07-17 MED ORDER — MORPHINE SULFATE (PF) 4 MG/ML IV SOLN
4.0000 mg | Freq: Once | INTRAVENOUS | Status: AC
  Administered 2017-07-17: 4 mg via INTRAVENOUS
  Filled 2017-07-17: qty 1

## 2017-07-17 MED ORDER — ONDANSETRON HCL 4 MG/2ML IJ SOLN
4.0000 mg | Freq: Once | INTRAMUSCULAR | Status: AC
  Administered 2017-07-17: 4 mg via INTRAVENOUS
  Filled 2017-07-17: qty 2

## 2017-07-17 MED ORDER — AMOXICILLIN-POT CLAVULANATE 875-125 MG PO TABS: 1.0000 | ORAL_TABLET | Freq: Two times a day (BID) | ORAL | 0 refills | Status: DC

## 2017-07-17 MED ORDER — HYDROCODONE-ACETAMINOPHEN 5-325 MG PO TABS
ORAL_TABLET | ORAL | Status: DC
Start: 2017-07-17 — End: 2017-07-18
  Filled 2017-07-17: qty 2

## 2017-07-17 MED ORDER — HYDROCODONE-ACETAMINOPHEN 5-325 MG PO TABS
2.0000 | ORAL_TABLET | Freq: Once | ORAL | Status: AC
  Administered 2017-07-17: 2 via ORAL

## 2017-07-17 MED ORDER — SODIUM CHLORIDE 0.9 % IV BOLUS (SEPSIS)
1000.0000 mL | Freq: Once | INTRAVENOUS | Status: AC
  Administered 2017-07-17: 1000 mL via INTRAVENOUS

## 2017-07-17 MED ORDER — METRONIDAZOLE 500 MG PO TABS: 500.0000 mg | ORAL_TABLET | Freq: Three times a day (TID) | ORAL | 0 refills | Status: AC

## 2017-07-17 NOTE — Discharge Instructions (Signed)
Please take both of your antibiotics as prescribed for a full 2 weeks and make an appointment to follow-up with the general surgeon in a week or so for reevaluation. Return to the emergency department sooner for any new or worsening symptoms such as fevers, chills, worsening pain, if you cannot eat or drink, or for any other concerns whatsoever.  It was a pleasure to take care of you today, and thank you for coming to our emergency department.  If you have any questions or concerns before leaving please ask the nurse to grab me and I'm more than happy to go through your aftercare instructions again.  If you were prescribed any opioid pain medication today such as Norco, Vicodin, Percocet, morphine, hydrocodone, or oxycodone please make sure you do not drive when you are taking this medication as it can alter your ability to drive safely.  If you have any concerns once you are home that you are not improving or are in fact getting worse before you can make it to your follow-up appointment, please do not hesitate to call 911 and come back for further evaluation.  Merrily Brittle, MD  Results for orders placed or performed during the hospital encounter of 07/17/17  Comprehensive metabolic panel  Result Value Ref Range   Sodium 139 135 - 145 mmol/L   Potassium 3.4 (L) 3.5 - 5.1 mmol/L   Chloride 103 101 - 111 mmol/L   CO2 25 22 - 32 mmol/L   Glucose, Bld 121 (H) 65 - 99 mg/dL   BUN 12 6 - 20 mg/dL   Creatinine, Ser 1.61 0.61 - 1.24 mg/dL   Calcium 8.9 8.9 - 09.6 mg/dL   Total Protein 7.6 6.5 - 8.1 g/dL   Albumin 4.3 3.5 - 5.0 g/dL   AST 42 (H) 15 - 41 U/L   ALT 81 (H) 17 - 63 U/L   Alkaline Phosphatase 59 38 - 126 U/L   Total Bilirubin 0.9 0.3 - 1.2 mg/dL   GFR calc non Af Amer >60 >60 mL/min   GFR calc Af Amer >60 >60 mL/min   Anion gap 11 5 - 15  CBC  Result Value Ref Range   WBC 12.4 (H) 3.8 - 10.6 K/uL   RBC 5.62 4.40 - 5.90 MIL/uL   Hemoglobin 16.2 13.0 - 18.0 g/dL   HCT 04.5 40.9  - 81.1 %   MCV 82.1 80.0 - 100.0 fL   MCH 28.9 26.0 - 34.0 pg   MCHC 35.2 32.0 - 36.0 g/dL   RDW 91.4 78.2 - 95.6 %   Platelets 189 150 - 440 K/uL  Lipase, blood  Result Value Ref Range   Lipase 33 11 - 51 U/L   Ct Head Wo Contrast  Result Date: 07/06/2017 CLINICAL DATA:  Patient with facial numbness and tongue numbness with tingling along the right side of the face with onset at 1500 this afternoon. Patient with slight asymmetry in smile but no slurred speech is noted. Able raise eyebrows. Not a code stroke per RN. EXAM: CT HEAD WITHOUT CONTRAST TECHNIQUE: Contiguous axial images were obtained from the base of the skull through the vertex without intravenous contrast. COMPARISON:  None. FINDINGS: Brain: No evidence of acute infarction, hemorrhage, hydrocephalus, extra-axial collection or mass lesion/mass effect. Vascular: No hyperdense vessel or unexpected calcification. Skull: Normal. Negative for fracture or focal lesion. Sinuses/Orbits: No acute finding. Other: None. IMPRESSION: Negative exam. Electronically Signed   By: Norva Pavlov M.D.   On: 07/06/2017 20:55  Ct Abdomen Pelvis W Contrast  Result Date: 07/17/2017 CLINICAL DATA:  Bilateral lower quadrant pain and nausea x4 days. EXAM: CT ABDOMEN AND PELVIS WITH CONTRAST TECHNIQUE: Multidetector CT imaging of the abdomen and pelvis was performed using the standard protocol following bolus administration of intravenous contrast. CONTRAST:  ISOVUE-300 IOPAMIDOL (ISOVUE-300) INJECTION 61% COMPARISON:  None. FINDINGS: Lower chest: No acute abnormality. Minimal bibasilar dependent atelectasis. Hepatobiliary: Hepatic steatosis without space-occupying mass or biliary dilatation. Normal gallbladder without stones. Pancreas: Normal pancreas without mass or ductal dilatation. There is peripancreatic inflammation along the pancreatic tail believed to be secondary to acute diverticulitis and not pancreatitis. Spleen: Normal in size without focal  abnormality. Adrenals/Urinary Tract: Normal bilateral adrenal glands. Punctate nonobstructing left renal calculus with slight malrotation of the left kidney with the left renal pelvis facing ventrally. There is a cyst in the upper pole the right kidney measuring 2.5 cm. Stomach/Bowel: Normal appearing stomach and small bowel. There is colonic diverticulosis with acute distal transverse colonic diverticulitis and mild-to-moderate inflammatory thickening. No abscess. No bowel obstruction. Normal appendix. Vascular/Lymphatic: Aortic atherosclerosis. No enlarged abdominal or pelvic lymph nodes. Reproductive: Prostate is unremarkable. Other: Small fat containing umbilical hernia. No abdominopelvic ascites. Musculoskeletal: No acute or significant osseous findings. Minimal grade 1 retrolisthesis of L3 on L4. IMPRESSION: 1. Acute diverticulitis involving the distal transverse colon without bowel obstruction or abscess. Moderate degree of pericolonic inflammation is seen in the left upper quadrant. 2. 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 3. Hepatic steatosis. 4. Small fat containing umbilical hernia. Electronically Signed   By: Tollie Eth M.D.   On: 07/17/2017 22:53   Ct Angio Chest Aorta W And/or Wo Contrast  Result Date: 07/06/2017 CLINICAL DATA:  Facial numbness with tingling in the right side of the face, slight facial asymmetry chest pain EXAM: CT ANGIOGRAPHY CHEST WITH CONTRAST TECHNIQUE: Multidetector CT imaging of the chest was performed using the standard protocol during bolus administration of intravenous contrast. Multiplanar CT image reconstructions and MIPs were obtained to evaluate the vascular anatomy. CONTRAST:  100 mL Isovue 370 intravenous COMPARISON:  Radiograph 05/28/2017, CT brain 07/06/2017 FINDINGS: Cardiovascular: Non contrasted images of the thorax demonstrate no evidence for intramural  hematoma. Atherosclerotic calcifications. Minimal coronary artery calcification. Nonaneurysmal aorta. No dissection is seen. Left-sided arch with normal 3 vessel origin. Normal heart size. No pericardial effusion Mediastinum/Nodes: No enlarged mediastinal, hilar, or axillary lymph nodes. Thyroid gland, trachea, and esophagus demonstrate no significant findings. Lungs/Pleura: Lungs are clear. No pleural effusion or pneumothorax. Upper Abdomen: Partially visualized cyst in the upper pole of the right kidney Musculoskeletal: No chest wall abnormality. No acute or significant osseous findings. Review of the MIP images confirms the above findings. IMPRESSION: Negative for aortic aneurysm or dissection.  Clear lung fields. Aortic Atherosclerosis (ICD10-I70.0). Electronically Signed   By: Jasmine Pang M.D.   On: 07/06/2017 22:30

## 2017-07-17 NOTE — ED Provider Notes (Signed)
[facility] Emergency Department Provider Note  ____________________________________________   First MD Initiated Contact with Patient 07/17/17 2130     (approximate)  I have reviewed the triage vital signs and the nursing notes.   HISTORY  Chief Complaint Abdominal Pain    HPI Todd [name] is a 51 y.o. male who self presents to the emergency Department with roughly 4 days of insidious onset but gradually progressive mild to moderate cramping lower abdominal discomfort. Pain is mostly constant but particularly worse when defecating. He intermittently feels constipated and has had loose stools. He had roughly 6 loose stools today. He's had no fevers or chills. He's been nauseated but no vomiting. He is able to eat and drink. He's had no history of abdominal surgeries. He's had no recent antibiotics.   Past Medical History:  Diagnosis Date  . Anxiety   . Depression   . Diabetes mellitus without complication (HCC)   . GERD (gastroesophageal reflux disease)   . Hypertension   . Sleep apnea     Patient Active Problem List   Diagnosis Date Noted  . Substance induced mood disorder (HCC) 05/19/2016  . Severe recurrent major depression without psychotic features (HCC) 05/19/2016  . Suicidal ideation 05/19/2016  . HTN (hypertension) 05/19/2016  . GERD (gastroesophageal reflux disease) 05/19/2016  . Alcohol use disorder, mild, abuse 05/19/2016    Past Surgical History:  Procedure Laterality Date  . LEG AMPUTATION BELOW KNEE     left  . SHOULDER SURGERY      Prior to Admission medications   Medication Sig Start Date End Date Taking? Authorizing Provider  amoxicillin-clavulanate (AUGMENTIN) 875-125 MG tablet Take 1 tablet by mouth 2 (two) times daily. 07/17/17 07/31/17  Merrily Brittle, MD  HYDROcodone-acetaminophen (NORCO) 5-325 MG tablet Take 1 tablet by mouth every 6 (six) hours as needed for severe pain. 07/17/17   Merrily Brittle, MD  lisinopril  (PRINIVIL,ZESTRIL) 40 MG tablet Take 1 tablet (40 mg total) by mouth daily. 05/20/16   Pucilowska, Braulio Conte B, MD  metoCLOPramide (REGLAN) 10 MG tablet Take 1 tablet (10 mg total) by mouth 4 (four) times daily -  before meals and at bedtime. 01/02/16   Sharman Cheek, MD  metroNIDAZOLE (FLAGYL) 500 MG tablet Take 1 tablet (500 mg total) by mouth 3 (three) times daily. 07/17/17 07/31/17  Merrily Brittle, MD  ondansetron (ZOFRAN) 4 MG tablet Take 1 tablet (4 mg total) by mouth daily as needed for nausea or vomiting. 07/30/16   Emily Filbert, MD  ranitidine (ZANTAC) 150 MG capsule Take 1 capsule (150 mg total) by mouth 2 (two) times daily. 01/02/16   Sharman Cheek, MD  sucralfate (CARAFATE) 1 g tablet Take 1 tablet (1 g total) by mouth 4 (four) times daily. 01/02/16   Sharman Cheek, MD  zolpidem (AMBIEN) 10 MG tablet Take 1 tablet (10 mg total) by mouth at bedtime as needed for sleep. 05/20/16   Pucilowska, Ellin Goodie, MD    Allergies Benadryl [diphenhydramine]  No family history on file.  Social History Social History  Substance Use Topics  . Smoking status: Never Smoker  . Smokeless tobacco: Never Used  . Alcohol use No    Review of Systems Constitutional: No fever/chills Eyes: No visual changes. ENT: No sore throat. Cardiovascular: Denies chest pain. Respiratory: Denies shortness of breath. Gastrointestinal: positive for abdominal pain.  positive for nausea, no vomiting.  positive for diarrhea.  positive for constipation. Genitourinary: Negative for dysuria. Musculoskeletal: Negative for back pain. Skin: Negative  for rash. Neurological: Negative for headaches, focal weakness or numbness.   ____________________________________________   PHYSICAL EXAM:  VITAL SIGNS: ED Triage Vitals  Enc Vitals Group     BP 07/17/17 2121 (!) 155/108     Pulse Rate 07/17/17 2121 88     Resp 07/17/17 2121 16     Temp 07/17/17 2121 98.1 F (36.7 C)     Temp Source 07/17/17 2121 Oral      SpO2 07/17/17 2121 98 %     Weight 07/17/17 2123 269 lb (122 kg)     Height 07/17/17 2123  (1.727 m)     Head Circumference --      Peak Flow --      Pain Score 07/17/17 2121 7     Pain Loc --      Pain Edu? --      Excl. in GC? --     Constitutional: alert and oriented 4 pleasant cooperative speaks in full clear sentences no diaphoresis Eyes: PERRL EOMI. Head: Atraumatic. Nose: No congestion/rhinnorhea. Mouth/Throat: No trismus Neck: No stridor.   Cardiovascular: Normal rate, regular rhythm. Grossly normal heart sounds.  Good peripheral circulation. Respiratory: Normal respiratory effort.  No retractions. Lungs CTAB and moving good air Gastrointestinal: soft nondistended she is tender bilateral lower quadrants although with no rebound or guarding and no frank peritonitis Musculoskeletal: No lower extremity edema   Neurologic:  Normal speech and language. No gross focal neurologic deficits are appreciated. Skin:  Skin is warm, dry and intact. No rash noted. Psychiatric: Mood and affect are normal. Speech and behavior are normal.    ____________________________________________   DIFFERENTIAL includes but not limited to  appendicitis, diverticulitis, small bowel obstruction, colitis, infectious diarrhea ____________________________________________   LABS (all labs ordered are listed, but only abnormal results are displayed)  Labs Reviewed  COMPREHENSIVE METABOLIC PANEL - Abnormal; Notable for the following:       Result Value   Potassium 3.4 (*)    Glucose, Bld 121 (*)    AST 42 (*)    ALT 81 (*)    All other components within normal limits  CBC - Abnormal; Notable for the following:    WBC 12.4 (*)    All other components within normal limits  LIPASE, BLOOD  URINALYSIS, COMPLETE (UACMP) WITH MICROSCOPIC    blood work reviewed and interpreted by me essentially normal elevated white count is nonspecific and could be secondary to  pain __________________________________________  EKG   ____________________________________________  RADIOLOGY  CT scan abdomen and pelvis reviewed by me shows nonperforated transverse colon diverticulitis ____________________________________________   PROCEDURES  Procedure(s) performed: no  Procedures  Critical Care performed: no  Observation: no ____________________________________________   INITIAL IMPRESSION / ASSESSMENT AND PLAN / ED COURSE  Pertinent labs & imaging results that were available during my care of the patient were reviewed by me and considered in my medical decision making (see chart for details).  The patient arrives with lower abdominal pain along with diarrhea and constipation and is somewhat tender. History is mostly concerning for diverticulitis. He's never had diverticulitis in the past so I believe she warrants a CT scan for true diagnosis.  The CT scan confirms simple nonperforated diverticulitis. He is given an oral dose of Augmentin and Flagyl now and understands to take a full 2 week course. I will refer him to general surgery and is now patient. He is able to eat and drink and he is not frankly peritoneal. He is medically stable  for outpatient management verbalizes understanding and agreement with the plan.      ____________________________________________   FINAL CLINICAL IMPRESSION(S) / ED DIAGNOSES  Final diagnoses:  Diverticulitis      NEW MEDICATIONS STARTED DURING THIS VISIT:  New Prescriptions   AMOXICILLIN-CLAVULANATE (AUGMENTIN) 875-125 MG TABLET    Take 1 tablet by mouth 2 (two) times daily.   HYDROCODONE-ACETAMINOPHEN (NORCO) 5-325 MG TABLET    Take 1 tablet by mouth every 6 (six) hours as needed for severe pain.   METRONIDAZOLE (FLAGYL) 500 MG TABLET    Take 1 tablet (500 mg total) by mouth 3 (three) times daily.     Note:  This document was prepared using Dragon voice recognition software and may include  unintentional dictation errors.     Merrily Brittle, MD 07/17/17 2318

## 2017-07-17 NOTE — ED Notes (Signed)
Patient transported to CT 

## 2017-07-17 NOTE — ED Triage Notes (Signed)
Pt states bilateral lower quadrant pain with nausea for 4 days. Pt states pain is worse with movement and breathing. Pt states has been having watery stools as well, no known fever per pt.

## 2017-07-18 NOTE — ED Notes (Signed)
Reviewed d/c instructions, follow-up care, prescriptions with patient. Pt verbalized understanding.  

## 2017-07-20 ENCOUNTER — Ambulatory Visit: Payer: Medicare Other

## 2017-07-20 DIAGNOSIS — Z9889 Other specified postprocedural states: Secondary | ICD-10-CM

## 2017-07-20 DIAGNOSIS — G8929 Other chronic pain: Secondary | ICD-10-CM

## 2017-07-20 DIAGNOSIS — M25512 Pain in left shoulder: Secondary | ICD-10-CM

## 2017-07-20 NOTE — Therapy (Signed)
[facility] [facility] PH[facility]INE [address] [place], [address] Phone: [phone]   Fax:  [phone]  Physical Therapy Treatment  Patient Details  Name: Todd [name] MRN: [id number] Date of Birth: 1965/12/17 Referring Provider: Dr. Ivin Poot  Encounter Date: 07/20/2017      PT End of Session - 07/20/17 1359    Visit Number 2   Number of Visits 9   PT Start Time 1300   PT Stop Time 1345   PT Time Calculation (min) 45 min   Activity Tolerance Patient tolerated treatment well   Behavior During Therapy [facility] for tasks assessed/performed      Past Medical History:  Diagnosis Date  . Anxiety   . Depression   . Diabetes mellitus without complication (HCC)   . GERD (gastroesophageal reflux disease)   . Hypertension   . Sleep apnea     Past Surgical History:  Procedure Laterality Date  . LEG AMPUTATION BELOW KNEE     left  . SHOULDER SURGERY      There were no vitals filed for this visit.      Subjective Assessment - 07/20/17 1359    Subjective Pt states that he is doing well on this date. He denies shoulder pain currently. No specific questions or concerns at this time. Back surgery is scheduled for 09/09/17.    Limitations Sitting;Lifting;Standing;Walking;House hold activities   Diagnostic tests MRI report - Question bilateral L5 pars defects. There is grade 1 anterolisthesis of L5 on S1. Multilevel disc space narrowing, most severe at L5-S1. There is also multilevel endplate sclerosis/osteophytosis and facet arthrosis. Vertebral body heights are maintained.   Patient Stated Goals To get healthier and be able to return to work.    Currently in Pain? No/denies          TREATMENT  Ther-ex  UBE x 5 minutes forward/backwards during history for warm-up (2 minutes unbilled); OMEGA lat pull down 35# x 15, 45# 2 x 15; TRX rows 2 x 15; OMEGA lat bar shoulder extensions 20# 2 x 15; Prone I, Y, and T 1# DB 3 x 15 each;   Supine serratus punch with manual resistance 2 x 10;  Intermittent verbal and tactile cues provided for education and to correct form/technique;  Manual Therapy Soft tissue mobilization to long head of biceps tendon and middle deltoid;                          PT Education - 07/20/17 1406    Education provided Yes   Education Details Exercise form/technique   Person(s) Educated Patient   Methods Explanation;Demonstration   Comprehension Verbalized understanding             PT Long Term Goals - 07/15/17 1518      PT LONG TERM GOAL #1   Title Patient will report QuickDash score of less than 2% to demonstrate improved tolerance for ADLs.    Time 4   Period Weeks   Status New   Target Date 08/12/17     PT LONG TERM GOAL #2   Title Patient will complete mechanical duties at work with no increase in pain to demonstrate improvedtolerance for work related activities.    Time 4   Period Weeks   Status New   Target Date 08/12/17               Plan - 07/20/17 1359    Clinical Impression Statement  Pt does well with therapy today. He demonstrates appropriate low and mid trap fatigue with prone I, Y, and T exercise. Pt denies pain throughout therapy session. Pt encouraged to continue HEP and follow-up as scheduled.    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 Weight loss due to GI issues.   PT Frequency 2x / week   PT Duration 4 weeks   PT Treatment/Interventions Passive range of motion;Therapeutic exercise;Therapeutic activities;Balance training;Taping;Dry needling;Manual techniques;Ultrasound;Moist Heat;Iontophoresis /ml Dexamethasone;Cryotherapy;Electrical Stimulation;ADLs/Self Care Home Management   PT Next Visit Plan Work on posterior scapular strength (LT/MT)   Consulted and  Agree with Plan of Care Patient      Patient will benefit from skilled therapeutic intervention in order to improve the following deficits and impairments:  Impaired UE functional use, Pain, Decreased activity tolerance, Decreased strength  Visit Diagnosis: S/P left rotator cuff repair  Chronic left shoulder pain     Problem List Patient Active Problem List   Diagnosis Date Noted  . Substance induced mood disorder (HCC) 05/19/2016  . Severe recurrent major depression without psychotic features (HCC) 05/19/2016  . Suicidal ideation 05/19/2016  . HTN (hypertension) 05/19/2016  . GERD (gastroesophageal reflux disease) 05/19/2016  . Alcohol use disorder, mild, abuse 05/19/2016   Lynnea Maizes PT, DPT   Demetric Parslow 07/20/2017, 2:50 PM  [place] [facility] PH[facility] AND SPORTS MEDICINE [address], [place], [address] Phone: [phone]   Fax:  [phone]  Name: Zuri Bradway MRN: [id number] Date of Birth: [birth year]

## 2017-07-22 ENCOUNTER — Ambulatory Visit: Payer: Medicare Other | Admitting: Physical Therapy

## 2017-07-22 DIAGNOSIS — M25512 Pain in left shoulder: Secondary | ICD-10-CM

## 2017-07-22 DIAGNOSIS — Z9889 Other specified postprocedural states: Secondary | ICD-10-CM | POA: Diagnosis not present

## 2017-07-22 DIAGNOSIS — G8929 Other chronic pain: Secondary | ICD-10-CM

## 2017-07-22 NOTE — Patient Instructions (Addendum)
Prone horizontal abduction with ER x 12 with 2# DB for 2 sets, 3# DB x 10 for 2 sets  Prone Y with 2# DB x 12 for 2 sets, 3# x 10  Attempted prone row with ER - with 2# weight, patient reported increased pain in the long head of the biceps region   90-90 ER position with 2# DB x 15, 3# DB x 15, 4# x15 repetitions  Low row with circular blue band x 18, x 20  with appropriate mechanics to activate low traps   TRX rows x 12 for 2 sets

## 2017-07-23 ENCOUNTER — Ambulatory Visit (INDEPENDENT_AMBULATORY_CARE_PROVIDER_SITE_OTHER): Payer: Medicare Other | Admitting: Surgery

## 2017-07-23 ENCOUNTER — Other Ambulatory Visit: Payer: Self-pay

## 2017-07-23 ENCOUNTER — Encounter: Payer: Self-pay | Admitting: Surgery

## 2017-07-23 VITALS — BP 152/67 | HR 76 | Temp 98.1°F | Ht 68.0 in | Wt 267.0 lb

## 2017-07-23 DIAGNOSIS — K5792 Diverticulitis of intestine, part unspecified, without perforation or abscess without bleeding: Secondary | ICD-10-CM | POA: Diagnosis not present

## 2017-07-23 MED ORDER — AMOXICILLIN-POT CLAVULANATE 875-125 MG PO TABS: 1.0000 | ORAL_TABLET | Freq: Two times a day (BID) | ORAL | 0 refills | Status: AC

## 2017-07-23 NOTE — Patient Instructions (Addendum)
You will need to finish your antibiotics.   We have sent you home with a refill as well.  Please [facility] to schedule a colonoscopy in 6 weeks.  Give our office a call if you have any questions or concerns.
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
## 2017-07-25 NOTE — Therapy (Signed)
[place] [facility] [address] [place], [address] Phone: [phone]   Fax:  [phone]  Physical Therapy Treatment  Patient Details  Name: Todd [name] MRN: [id number] Date of Birth: 1966/06/14 Referring Provider: Dr. Ivin Poot  Encounter Date: 07/22/2017      PT End of Session - 07/25/17 2046    Visit Number 3   Number of Visits 9   PT Start Time 1507   PT Stop Time 1532   PT Time Calculation (min) 25 min   Activity Tolerance Patient tolerated treatment well   Behavior During Therapy [facility] for tasks assessed/performed      Past Medical History:  Diagnosis Date  . Anxiety   . 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 Chronic left shoulder pain 02/15/2016  . Depression   . Diabetes mellitus without complication (HCC)   . Dislocation of right thumb 06/05/2016  . Gastroparesis 06/08/2017  . GERD (gastroesophageal reflux disease)   . HTN (hypertension) 05/19/2016  . Hypertension   . MDD (major depressive disorder), recurrent episode (HCC) 05/28/2016  . Neck pain, bilateral 02/15/2016  . Primary osteoarthritis of right knee 01/31/2016  . PTSD (post-traumatic stress disorder) 06/05/2016  . Severe recurrent major depression without psychotic features (HCC) 05/19/2016  . Sleep apnea   . Substance induced mood disorder (HCC) 05/19/2016  . Suicidal ideation 05/19/2016  . Type 2 diabetes mellitus with complication, without long-term current use of insulin (HCC) 06/08/2017  . Uncontrolled type 2 diabetes mellitus with hyperglycemia, without long-term current use of insulin (HCC) 03/12/2017    Past Surgical History:  Procedure Laterality Date  . LEG AMPUTATION BELOW KNEE     left  . SHOULDER SURGERY      There were no vitals filed for this visit.      Subjective Assessment - 07/25/17 2047    Subjective Patient reports his L distal biceps is hurting today, and he gets some pain at the long head of the biceps  insertion with full overhead motion.    Limitations Sitting;Lifting;Standing;Walking;House hold activities   Diagnostic tests MRI report - Question bilateral L5 pars defects. There is grade 1 anterolisthesis of L5 on S1. Multilevel disc space narrowing, most severe at L5-S1. There is also multilevel endplate sclerosis/osteophytosis and facet arthrosis. Vertebral body heights are maintained.   Patient Stated Goals To get healthier and be able to return to work.    Currently in Pain? Other (Comment)  He has chronic low back pain, reports intermittent shoulder pain with full overhead motion at end range still.       Prone horizontal abduction with ER x 12 with 2# DB for 2 sets, 3# DB x 10 for 2 sets  Prone Y with 2# DB x 12 for 2 sets, 3# x 10  Attempted prone row with ER - with 2# weight, patient reported increased pain in the long head of the biceps region   90-90 ER position with 2# DB x 15, 3# DB x 15, 4# x15 repetitions  Low row with circular blue band x 18, x 20  with appropriate mechanics to activate low traps   TRX rows x 12 for 2 sets                            PT Education - 07/25/17 2046    Education provided Yes   Education Details Will continue to monitor pain levels and progress  strengthening as tolerated.    Person(s) Educated Patient   Methods Explanation;Demonstration   Comprehension Returned demonstration;Verbalized understanding             PT Long Term Goals - 07/15/17 1518      PT LONG TERM GOAL #1   Title Patient will report QuickDash score of less than 2% to demonstrate improved tolerance for ADLs.    Time 4   Period Weeks   Status New   Target Date 08/12/17     PT LONG TERM GOAL #2   Title Patient will complete mechanical duties at work with no increase in pain to demonstrate improvedtolerance for work related activities.    Time 4   Period Weeks   Status New   Target Date 08/12/17               Plan - 07/25/17  2044    Clinical Impression Statement Patient tolerates progression of strengthening exercises targeting low and mid trap musculature. He does not have pain with ther-ex, though still gets pain with full overhead movement, and will likely benefit from continue dstrengthening of scapulo-humeral strengthening.    Clinical Presentation Stable   Clinical Decision Making Low   Rehab Potential Good   Clinical Impairments Affecting Rehab Potential Noted to have mass on spinal cord on MRI, possible surgical candidate. Weight loss due to GI issues.   PT Frequency 2x / week   PT Duration 4 weeks   PT Treatment/Interventions Passive range of motion;Therapeutic exercise;Therapeutic activities;Balance training;Taping;Dry needling;Manual techniques;Ultrasound;Moist Heat;Iontophoresis /ml Dexamethasone;Cryotherapy;Electrical Stimulation;ADLs/Self Care Home Management   PT Next Visit Plan Work on posterior scapular strength (LT/MT)   Consulted and Agree with Plan of Care Patient      Patient will benefit from skilled therapeutic intervention in order to improve the following deficits and impairments:  Impaired UE functional use, Pain, Decreased activity tolerance, Decreased strength  Visit Diagnosis: S/P left rotator cuff repair  Chronic left shoulder pain     Problem List Patient Active Problem List   Diagnosis Date Noted  . 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 Gastroparesis 06/08/2017  . History of noncompliance with medical treatment 06/08/2017  . Type 2 diabetes mellitus with complication, without long-term current use of insulin (HCC) 06/08/2017  . Uncontrolled type 2 diabetes mellitus with hyperglycemia, without long-term current use of insulin (HCC) 03/12/2017  . 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 Dislocation of right thumb 06/05/2016  . PTSD (post-traumatic stress disorder) 06/05/2016  . MDD (major depressive disorder), recurrent episode (HCC) 05/28/2016  . Substance induced mood  disorder (HCC) 05/19/2016  . Severe recurrent major depression without psychotic features (HCC) 05/19/2016  . Suicidal ideation 05/19/2016  . HTN (hypertension) 05/19/2016  . GERD (gastroesophageal reflux disease) 05/19/2016  . Alcohol use disorder, mild, abuse 05/19/2016  . Chronic left shoulder pain 02/15/2016  . Neck pain, bilateral 02/15/2016  . Primary osteoarthritis of right knee 01/31/2016   Alva Garnet PT, DPT, CSCS    07/25/2017, 8:49 PM  [place] [facility] PHYSICAL AND SPORTS MEDICINE [address] [place], [address] Phone: [phone]   Fax:  [phone]  Name: Todd [name] MRN: [id number] Date of Birth: 12-06-1965

## 2017-07-27 ENCOUNTER — Ambulatory Visit: Payer: Medicare Other | Admitting: Physical Therapy

## 2017-07-27 DIAGNOSIS — Z9889 Other specified postprocedural states: Secondary | ICD-10-CM | POA: Diagnosis not present

## 2017-07-27 DIAGNOSIS — M25512 Pain in left shoulder: Secondary | ICD-10-CM

## 2017-07-27 DIAGNOSIS — G8929 Other chronic pain: Secondary | ICD-10-CM

## 2017-07-27 NOTE — Therapy (Signed)
[place] [facility] [address], [place], [address] Phone: [phone]   Fax:  [phone]  Physical Therapy Treatment  Patient Details  Name: Todd [name] MRN: [id number] Date of Birth: 09/08/1966 Referring Provider: Dr. Ivin Poot  Encounter Date: 07/27/2017      PT End of Session - 07/27/17 1128    Visit Number 4   Number of Visits 9   PT Start Time 1119   PT Stop Time 1201   PT Time Calculation (min) 42 min   Activity Tolerance Patient tolerated treatment well   Behavior During Therapy [facility] for tasks assessed/performed      Past Medical History:  Diagnosis Date  . Anxiety   . 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 Chronic left shoulder pain 02/15/2016  . Depression   . Diabetes mellitus without complication (HCC)   . Dislocation of right thumb 06/05/2016  . Gastroparesis 06/08/2017  . GERD (gastroesophageal reflux disease)   . HTN (hypertension) 05/19/2016  . Hypertension   . MDD (major depressive disorder), recurrent episode (HCC) 05/28/2016  . Neck pain, bilateral 02/15/2016  . Primary osteoarthritis of right knee 01/31/2016  . PTSD (post-traumatic stress disorder) 06/05/2016  . Severe recurrent major depression without psychotic features (HCC) 05/19/2016  . Sleep apnea   . Substance induced mood disorder (HCC) 05/19/2016  . Suicidal ideation 05/19/2016  . Type 2 diabetes mellitus with complication, without long-term current use of insulin (HCC) 06/08/2017  . Uncontrolled type 2 diabetes mellitus with hyperglycemia, without long-term current use of insulin (HCC) 03/12/2017    Past Surgical History:  Procedure Laterality Date  . LEG AMPUTATION BELOW KNEE     left  . SHOULDER SURGERY      There were no vitals filed for this visit.      Subjective Assessment - 07/27/17 1123    Subjective Patient reports he continues to get distal L biceps pain and forearm pain, he does not report any shoulder pain  at this time. He has been going to the gym intermittently.    Limitations Sitting;Lifting;Standing;Walking;House hold activities   Diagnostic tests MRI report - Question bilateral L5 pars defects. There is grade 1 anterolisthesis of L5 on S1. Multilevel disc space narrowing, most severe at L5-S1. There is also multilevel endplate sclerosis/osteophytosis and facet arthrosis. Vertebral body heights are maintained.   Patient Stated Goals To get healthier and be able to return to work.    Currently in Pain? Other (Comment)  Patient reports some biceps tendon (distal portion) and forearm pain in his LUE with bicep curls and rows after working out in the gym      Standing ER with cable 5# x 12, 6# x 13, x 11 with towel between arm and rib cage   1 arm cable pull downs with 15# x 15, 25# x 12, 35# x 10  Prone horizontal abduction with 2# weight (modified to below 90 degrees of abduction) x 10 repetitions-- appropriate activation/fatigue. 2nd set x12, 3rd set x12, 4th set x12  Prone Y with 1# DB x10 repetitions (appropriate technique), 2# x 10 for 3 sets   Sidelying ER's 2# x 15, 4# x12 for 2 sets, 5# x10  Low rows with blue t-band bilaterally x 20 for 3 sets   Single arm rows x12 at 20# while sitting, 25# x 12, 35# x x12  PT Education - 07/27/17 1128    Education provided Yes   Education Details Patient will benefit from continued posterior scapular strengthening.    Person(s) Educated Patient   Methods Explanation;Demonstration   Comprehension Verbalized understanding;Returned demonstration             PT Long Term Goals - 07/15/17 1518      PT LONG TERM GOAL #1   Title Patient will report QuickDash score of less than 2% to demonstrate improved tolerance for ADLs.    Time 4   Period Weeks   Status New   Target Date 08/12/17     PT LONG TERM GOAL #2   Title Patient will complete mechanical duties at work with no increase in pain  to demonstrate improvedtolerance for work related activities.    Time 4   Period Weeks   Status New   Target Date 08/12/17               Plan - 07/27/17 1129    Clinical Impression Statement Patient continues to demonstrate improving strength of posterior scapular musculature and rotator cuff musculature. He continues to get pain after working out at [employer], though it is likely he has not gone through proper gradation to his training, he was encouraged to reduce his weight and increase his repetitions.    Clinical Presentation Stable   Clinical Decision Making Low   Rehab Potential Good   Clinical Impairments Affecting Rehab Potential Noted to have mass on spinal cord on MRI, possible surgical candidate. Weight loss due to GI issues.   PT Frequency 2x / week   PT Duration 4 weeks   PT Treatment/Interventions Passive range of motion;Therapeutic exercise;Therapeutic activities;Balance training;Taping;Dry needling;Manual techniques;Ultrasound;Moist Heat;Iontophoresis /ml Dexamethasone;Cryotherapy;Electrical Stimulation;ADLs/Self Care Home Management   PT Next Visit Plan Work on posterior scapular strength (LT/MT)   Consulted and Agree with Plan of Care Patient      Patient will benefit from skilled therapeutic intervention in order to improve the following deficits and impairments:  Impaired UE functional use, Pain, Decreased activity tolerance, Decreased strength  Visit Diagnosis: S/P left rotator cuff repair  Chronic left shoulder pain     Problem List Patient Active Problem List   Diagnosis Date Noted  . 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 Gastroparesis 06/08/2017  . History of noncompliance with medical treatment 06/08/2017  . Type 2 diabetes mellitus with complication, without long-term current use of insulin (HCC) 06/08/2017  . Uncontrolled type 2 diabetes mellitus with hyperglycemia, without long-term current use of insulin (HCC) 03/12/2017  . Arthrosis of left  acromioclavicular joint 11/09/2016  . Dislocation of right thumb 06/05/2016  . PTSD (post-traumatic stress disorder) 06/05/2016  . MDD (major depressive disorder), recurrent episode (HCC) 05/28/2016  . Substance induced mood disorder (HCC) 05/19/2016  . Severe recurrent major depression without psychotic features (HCC) 05/19/2016  . Suicidal ideation 05/19/2016  . HTN (hypertension) 05/19/2016  . GERD (gastroesophageal reflux disease) 05/19/2016  . Alcohol use disorder, mild, abuse 05/19/2016  . Chronic left shoulder pain 02/15/2016  . Neck pain, bilateral 02/15/2016  . Primary osteoarthritis of right knee 01/31/2016   Alva Garnet PT, DPT, CSCS    07/27/2017, 12:49 PM  [place] [facility] PHYSICAL AND SPORTS MEDICINE [address], [place], [address] Phone: [phone]   Fax:  [phone]  Name: Todd [name] MRN: [id number] Date of Birth: 20-Sep-1966

## 2017-07-27 NOTE — Patient Instructions (Addendum)
Standing ER with cable 5# x 12, 6# x 13, x 11 with towel between arm and rib cage   1 arm cable pull downs with 15# x 15, 25# x 12, 35# x 10  Prone horizontal abduction with 2# weight (modified to below 90 degrees of abduction) x 10 repetitions-- appropriate activation/fatigue. 2nd set x12, 3rd set x12, 4th set x12  Prone Y with 1# DB x10 repetitions (appropriate technique), 2# x 10 for 3 sets   Sidelying ER's 2# x 15, 4# x12 for 2 sets, 5# x10  Low rows with blue t-band bilaterally x 20 for 3 sets   Single arm rows x12 at 20# while sitting, 25# x 12, 35# x x12

## 2017-07-29 ENCOUNTER — Ambulatory Visit: Payer: Medicare Other | Admitting: Physical Therapy

## 2017-07-29 DIAGNOSIS — Z9889 Other specified postprocedural states: Secondary | ICD-10-CM | POA: Diagnosis not present

## 2017-07-29 DIAGNOSIS — G8929 Other chronic pain: Secondary | ICD-10-CM

## 2017-07-29 DIAGNOSIS — M25512 Pain in left shoulder: Secondary | ICD-10-CM

## 2017-07-29 NOTE — Therapy (Signed)
[facility] [address], [place], [address] Phone: [phone]   Fax:  [phone]  Physical Therapy Treatment  Patient Details  Name: Todd [name] MRN: [id number] Date of Birth: [birth year] Referring Provider: Dr. Alvin Critchley  Encounter Date: 07/29/2017      PT End of Session - 07/29/17 1625    Visit Number 5   Number of Visits 9   Date for PT Re-Evaluation 08/12/17   PT Start Time 1430   PT Stop Time 1514   PT Time Calculation (min) 44 min   Activity Tolerance Patient tolerated treatment well   Behavior During Therapy [facility] for tasks assessed/performed      Past Medical History:  Diagnosis Date  . Anxiety   . 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 Chronic left shoulder pain 02/15/2016  . Depression   . Diabetes mellitus without complication ([place])   . Dislocation of right thumb 06/05/2016  . Gastroparesis 06/08/2017  . GERD (gastroesophageal reflux disease)   . HTN (hypertension) 05/19/2016  . Hypertension   . MDD (major depressive disorder), recurrent episode ([place]) 05/28/2016  . Neck pain, bilateral 02/15/2016  . Primary osteoarthritis of right knee 01/31/2016  . PTSD (post-traumatic stress disorder) 06/05/2016  . Severe recurrent major depression without psychotic features ([place]) 05/19/2016  . Sleep apnea   . Substance induced mood disorder ([place]) 05/19/2016  . Suicidal ideation 05/19/2016  . Type 2 diabetes mellitus with complication, without long-term current use of insulin ([place]) 06/08/2017  . Uncontrolled type 2 diabetes mellitus with hyperglycemia, without long-term current use of insulin ([place]) 03/12/2017    Past Surgical History:  Procedure Laterality Date  . LEG AMPUTATION BELOW KNEE     left  . SHOULDER SURGERY      There were no vitals filed for this visit.      Subjective Assessment - 07/29/17 1610    Subjective Pt reports he has not been going to the gym because he's afraid he will  overdo his shoulder strengthening.   Limitations Sitting;Lifting;Standing;Walking;House hold activities   Diagnostic tests MRI report - Question bilateral L5 pars defects. There is grade 1 anterolisthesis of L5 on S1. Multilevel disc space narrowing, most severe at L5-S1. There is also multilevel endplate sclerosis/osteophytosis and facet arthrosis. Vertebral body heights are maintained.   Patient Stated Goals To get healthier and be able to return to work.    Currently in Pain? No/denies                         [facility] Adult PT Treatment/Exercise - 07/29/17 0001      Exercises   Exercises Shoulder     Shoulder Exercises: Seated   Other Seated Exercises seated 1 arm cable pull downs c 15#, 25#, 35# all 2x10; single arm rows x15 with 20# and 25#     Shoulder Exercises: Prone   Horizontal ABduction 1 Weight (lbs) horiz abd with 2# 2x10 and prone Y  with 1# and 2# 2x10     Shoulder Exercises: Sidelying   External Rotation Weight (lbs) 2#, 4#, and 5# x15 2 sets     Shoulder Exercises: Standing   External Rotation Strengthening;20 reps;Theraband  black tubing   Row Theraband;20 reps  black   Other Standing Exercises standing sword pulls with black tubing 2x7                     PT Long  Term Goals - 07/29/17 1630      PT LONG TERM GOAL #1   Title Patient will report QuickDash score of less than 2% to demonstrate improved tolerance for ADLs.    Time 4   Period Weeks   Status New   Target Date 08/12/17     PT LONG TERM GOAL #2   Title Patient will complete mechanical duties at work with no increase in pain to demonstrate improvedtolerance for work related activities.    Baseline Severe pain that radiates with extension, milder with rotations.    Time 4   Period Weeks   Status New   Target Date 08/12/17     PT LONG TERM GOAL #3   Title Patient will be able to stand for at least 30 minutes with no increase in symptoms to return to ADLs.    Baseline  Pain with prolonged time (greater than 15 minutes) in any 1 position.    Time 8   Period Weeks   Status New     PT LONG TERM GOAL #4   Title Patient will report average pain of less than 3/10 to demonstrate improved tolerance for ADLs.    Baseline 5/10   Time 8   Period Weeks   Status Partially Met               Plan - 07/29/17 1627    Clinical Impression Statement Pt verbalized his understanding that he should not "overdo" his shoulder strengthening at the gym.   Clinical Presentation Stable   Clinical Decision Making Low   Rehab Potential Good   Clinical Impairments Affecting Rehab Potential Noted to have mass on spinal cord on MRI, possible surgical candidate. Weight loss due to GI issues.   PT Frequency 2x / week   PT Duration 4 weeks   PT Treatment/Interventions Passive range of motion;Therapeutic exercise;Therapeutic activities;Balance training;Taping;Dry needling;Manual techniques;Ultrasound;Moist Heat;Iontophoresis 7m/ml Dexamethasone;Cryotherapy;Electrical Stimulation;ADLs/Self Care Home Management   PT Next Visit Plan Work on posterior scapular strength (LT/MT)      Patient will benefit from skilled therapeutic intervention in order to improve the following deficits and impairments:  Impaired UE functional use, Pain, Decreased activity tolerance, Decreased strength  Visit Diagnosis: S/P left rotator cuff repair  Chronic left shoulder pain     Problem List Patient Active Problem List   Diagnosis Date Noted  . 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 Gastroparesis 06/08/2017  . History of noncompliance with medical treatment 06/08/2017  . Type 2 diabetes mellitus with complication, without long-term current use of insulin ([place] 06/08/2017  . Uncontrolled type 2 diabetes mellitus with hyperglycemia, without long-term current use of insulin ([place] 03/12/2017  . 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 Dislocation of right thumb 06/05/2016  . PTSD  (post-traumatic stress disorder) 06/05/2016  . MDD (major depressive disorder), recurrent episode ([place] 05/28/2016  . Substance induced mood disorder ([place] 05/19/2016  . Severe recurrent major depression without psychotic features ([place] 05/19/2016  . Suicidal ideation 05/19/2016  . HTN (hypertension) 05/19/2016  . GERD (gastroesophageal reflux disease) 05/19/2016  . Alcohol use disorder, mild, abuse 05/19/2016  . Chronic left shoulder pain 02/15/2016  . Neck pain, bilateral 02/15/2016  . Primary osteoarthritis of right knee 01/31/2016    Aleric Froelich, MPT 07/29/2017, 4:33 PM  [facility] [address] [place] [address]Phone: [phone]  Fax:  [phone] Name: AThierno HunMRN: [id number] of Birth: 2Jun 01, 1967

## 2017-08-03 ENCOUNTER — Ambulatory Visit: Payer: Medicare Other | Admitting: Physical Therapy

## 2017-08-05 ENCOUNTER — Ambulatory Visit: Payer: Medicare Other | Admitting: Physical Therapy

## 2017-08-09 ENCOUNTER — Encounter: Payer: Medicare Other | Admitting: Physical Therapy

## 2017-08-12 ENCOUNTER — Encounter: Payer: Medicare Other | Admitting: Physical Therapy

## 2017-08-26 ENCOUNTER — Emergency Department
Admission: EM | Admit: 2017-08-26 | Discharge: 2017-08-26 | Disposition: A | Discharge status: Home / Self Care | Payer: Medicare Other | Attending: Emergency Medicine | Admitting: Emergency Medicine

## 2017-08-26 ENCOUNTER — Emergency Department: Payer: Medicare Other

## 2017-08-26 ENCOUNTER — Encounter: Payer: Self-pay | Admitting: Emergency Medicine

## 2017-08-26 DIAGNOSIS — R112 Nausea with vomiting, unspecified: Secondary | ICD-10-CM

## 2017-08-26 DIAGNOSIS — R2241 Localized swelling, mass and lump, right lower limb: Secondary | ICD-10-CM | POA: Diagnosis present

## 2017-08-26 DIAGNOSIS — R1010 Upper abdominal pain, unspecified: Secondary | ICD-10-CM | POA: Insufficient documentation

## 2017-08-26 DIAGNOSIS — Z7984 Long term (current) use of oral hypoglycemic drugs: Secondary | ICD-10-CM | POA: Diagnosis not present

## 2017-08-26 DIAGNOSIS — I10 Essential (primary) hypertension: Secondary | ICD-10-CM | POA: Diagnosis not present

## 2017-08-26 DIAGNOSIS — Z79899 Other long term (current) drug therapy: Secondary | ICD-10-CM | POA: Diagnosis not present

## 2017-08-26 DIAGNOSIS — K760 Fatty (change of) liver, not elsewhere classified: Secondary | ICD-10-CM | POA: Insufficient documentation

## 2017-08-26 DIAGNOSIS — M79604 Pain in right leg: Secondary | ICD-10-CM | POA: Diagnosis not present

## 2017-08-26 DIAGNOSIS — E119 Type 2 diabetes mellitus without complications: Secondary | ICD-10-CM | POA: Insufficient documentation

## 2017-08-26 HISTORY — DX: Nausea with vomiting, unspecified: R11.2

## 2017-08-26 LAB — CBC
HEMATOCRIT: 49.7 % (ref 40.0–52.0)
HEMOGLOBIN: 16.5 g/dL (ref 13.0–18.0)
MCH: 27.8 pg (ref 26.0–34.0)
MCHC: 33.3 g/dL (ref 32.0–36.0)
MCV: 83.5 fL (ref 80.0–100.0)
Platelets: 176 10*3/uL (ref 150–440)
RBC: 5.95 MIL/uL — AB (ref 4.40–5.90)
RDW: 14 % (ref 11.5–14.5)
WBC: 6.4 10*3/uL (ref 3.8–10.6)

## 2017-08-26 LAB — COMPREHENSIVE METABOLIC PANEL
ALBUMIN: 4.9 g/dL (ref 3.5–5.0)
ALK PHOS: 58 U/L (ref 38–126)
ALT: 84 U/L — AB (ref 17–63)
ANION GAP: 10 (ref 5–15)
AST: 59 U/L — AB (ref 15–41)
BILIRUBIN TOTAL: 0.8 mg/dL (ref 0.3–1.2)
BUN: 14 mg/dL (ref 6–20)
CALCIUM: 9.7 mg/dL (ref 8.9–10.3)
CO2: 26 mmol/L (ref 22–32)
Chloride: 102 mmol/L (ref 101–111)
Creatinine, Ser: 1 mg/dL (ref 0.61–1.24)
GFR calc Af Amer: >60 mL/min (ref >60)
GFR calc non Af Amer: >60 mL/min (ref >60)
GLUCOSE: 107 mg/dL — AB (ref 65–99)
Potassium: 3.3 mmol/L — ABNORMAL LOW (ref 3.5–5.1)
Sodium: 138 mmol/L (ref 135–145)
TOTAL PROTEIN: 7.7 g/dL (ref 6.5–8.1)

## 2017-08-26 LAB — GLUCOSE, CAPILLARY: Glucose-Capillary: 105 mg/dL — ABNORMAL HIGH (ref 65–99)

## 2017-08-26 MED ORDER — IBUPROFEN 600 MG PO TABS: 600.0000 mg | ORAL_TABLET | Freq: Three times a day (TID) | ORAL | 0 refills | Status: DC | PRN

## 2017-08-26 MED ORDER — IBUPROFEN 800 MG PO TABS
800.0000 mg | ORAL_TABLET | Freq: Once | ORAL | Status: AC
  Administered 2017-08-26: 800 mg via ORAL
  Filled 2017-08-26: qty 1

## 2017-08-26 NOTE — ED Provider Notes (Signed)
[facility] Emergency Department Provider Note  ____________________________________________   First MD Initiated Contact with Patient 08/26/17 1633     (approximate)  I have reviewed the triage vital signs and the nursing notes.   HISTORY  Chief Complaint Leg Swelling  HPI Todd [name] is a 51 y.o. male is sent to the emergency department by his gastroenterologist for evaluation of right leg pain and swelling.  The patient reports several days of insidious onset gradually progressive aching in his distal right leg.  No history of deep vein thrombosis or pulmonary embolism.  No trauma.  No fevers or chills.  He has mild severity aching.  He is taking no pain medication and nothing seems to help.   Past Medical History:  Diagnosis Date  . Anxiety   . 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 Chronic left shoulder pain 02/15/2016  . Depression   . Diabetes mellitus without complication ([facility])   . Dislocation of right thumb 06/05/2016  . Gastroparesis 06/08/2017  . GERD (gastroesophageal reflux disease)   . HTN (hypertension) 05/19/2016  . Hypertension   . MDD (major depressive disorder), recurrent episode ([facility]) 05/28/2016  . Neck pain, bilateral 02/15/2016  . Primary osteoarthritis of right knee 01/31/2016  . PTSD (post-traumatic stress disorder) 06/05/2016  . Severe recurrent major depression without psychotic features ([facility]) 05/19/2016  . Sleep apnea   . Substance induced mood disorder ([facility]) 05/19/2016  . Suicidal ideation 05/19/2016  . Type 2 diabetes mellitus with complication, without long-term current use of insulin ([facility]) 06/08/2017  . Uncontrolled type 2 diabetes mellitus with hyperglycemia, without long-term current use of insulin ([facility]) 03/12/2017    Patient Active Problem List   Diagnosis Date Noted  . 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 Gastroparesis 06/08/2017  . History of noncompliance with medical treatment 06/08/2017  . Type 2 diabetes mellitus with  complication, without long-term current use of insulin ([facility]) 06/08/2017  . Uncontrolled type 2 diabetes mellitus with hyperglycemia, without long-term current use of insulin ([facility]) 03/12/2017  . 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 Dislocation of right thumb 06/05/2016  . PTSD (post-traumatic stress disorder) 06/05/2016  . MDD (major depressive disorder), recurrent episode ([facility]) 05/28/2016  . Substance induced mood disorder ([facility]) 05/19/2016  . Severe recurrent major depression without psychotic features ([facility]) 05/19/2016  . Suicidal ideation 05/19/2016  . HTN (hypertension) 05/19/2016  . GERD (gastroesophageal reflux disease) 05/19/2016  . Alcohol use disorder, mild, abuse 05/19/2016  . Chronic left shoulder pain 02/15/2016  . Neck pain, bilateral 02/15/2016  . Primary osteoarthritis of right knee 01/31/2016    Past Surgical History:  Procedure Laterality Date  . LEG AMPUTATION BELOW KNEE     left  . SHOULDER SURGERY      Prior to Admission medications   Medication Sig Start Date End Date Taking? Authorizing Provider  B-D UF III MINI PEN NEEDLES 31G X 5 MM MISC  05/04/17   [provider]  cloNIDine (CATAPRES) 0.2 MG tablet  04/26/17   [provider]  desvenlafaxine (PRISTIQ) 50 MG 24 hr tablet Take 50 mg by mouth daily. 06/24/17   [provider]  doxepin (SINEQUAN) 50 MG capsule  04/26/17   [provider]  DULoxetine (CYMBALTA) 30 MG capsule Take 30 mg by mouth 2 (two) times daily. 06/15/17   [provider]  FARXIGA 10 MG TABS tablet  07/21/17   [provider]  gabapentin (NEURONTIN) 600 MG tablet TAKE 1 TABLET BY MOUTH FOUR  TIMES A DAY FOR PAIN 05/04/17   [provider]  glipiZIDE (GLUCOTROL XL) 10 MG 24 hr tablet  07/12/17   [provider]  hydrochlorothiazide (HYDRODIURIL) 25 MG tablet take 1 tablet by mouth once daily for BLOOD PRESSURE 05/04/17   [provider]  IBU 800 MG tablet   04/29/17   [provider]  ibuprofen (ADVIL,MOTRIN) 600 MG tablet Take 1 tablet (600 mg total) every 8 (eight) hours as needed by mouth. 08/26/17   Merrily Brittleifenbark, Oceanna Arruda, MD  lisinopril (PRINIVIL,ZESTRIL) 40 MG tablet Take 1 tablet (40 mg total) by mouth daily. 05/20/16   Pucilowska, Braulio ConteJolanta B, MD  metoCLOPramide (REGLAN) 10 MG tablet Take 1 tablet (10 mg total) by mouth 4 (four) times daily -  before meals and at bedtime. 01/02/16   Sharman CheekStafford, Phillip, MD  [facility] DELICA LANCETS FINE MISC USE TO TEST three times a day 05/29/17   [provider]  [facility] VER[facility] test strip  06/23/17   [provider]  sucralfate (CARAFATE) 1 g tablet Take 1 tablet (1 g total) by mouth 4 (four) times daily. 01/02/16   Sharman CheekStafford, Phillip, MD    Allergies Benadryl [diphenhydramine] and Diphenhydramine hcl  No family history on file.  Social History Social History   Tobacco Use  . Smoking status: Never Smoker  . Smokeless tobacco: Never Used  Substance Use Topics  . Alcohol use: No  . Drug use: No    Review of Systems Constitutional: No fever/chills ENT: No sore throat. Cardiovascular: Denies chest pain. Respiratory: Denies shortness of breath. Gastrointestinal: No abdominal pain.  No nausea, no vomiting.  No diarrhea.  No constipation. Musculoskeletal: Negative for back pain. Neurological: Negative for headaches   ____________________________________________   PHYSICAL EXAM:  VITAL SIGNS: ED Triage Vitals  Enc Vitals Group     BP 08/26/17 1528 135/85     Pulse Rate 08/26/17 1528 87     Resp 08/26/17 1528 18     Temp 08/26/17 1528 98.5 F (36.9 C)     Temp Source 08/26/17 1528 Oral     SpO2 08/26/17 1528 97 %     Weight 08/26/17 1529 267 lb (121.1 kg)     Height --      Head Circumference --      Peak Flow --      Pain Score 08/26/17 1528 6     Pain Loc --      Pain Edu? --      Excl. in GC? --     Constitutional: Alert and oriented x4 joking laughing  well-appearing nontoxic no diaphoresis speaks in full clear sentences Head: Atraumatic. Nose: No congestion/rhinnorhea. Mouth/Throat: No trismus Neck: No stridor.   Cardiovascular: Regular rate and rhythm Respiratory: Normal respiratory effort.  No retractions. Gastrointestinal: Right lower extremity with mild tenderness medial aspect of distal ankle no erythema no warmth no bulla or blisters or sloughing.  No edema Neurologic:  Normal speech and language. No gross focal neurologic deficits are appreciated.  Skin:  Skin is warm, dry and intact. No rash noted.    ____________________________________________  LABS (all labs ordered are listed, but only abnormal results are displayed)  Labs Reviewed  CBC - Abnormal; Notable for the following components:      Result Value   RBC 5.95 (*)    All other components within normal limits  COMPREHENSIVE METABOLIC PANEL - Abnormal; Notable for the following components:   Potassium 3.3 (*)    Glucose, Bld 107 (*)  AST 59 (*)    ALT 84 (*)    All other components within normal limits  GLUCOSE, CAPILLARY - Abnormal; Notable for the following components:   Glucose-Capillary 105 (*)    All other components within normal limits  CBG MONITORING, ED    Blood work reviewed and interpreted by me with no acute disease __________________________________________  EKG   ____________________________________________  RADIOLOGY  Ultrasound of the right lower extremity reviewed by me with no evidence of deep vein thrombosis ____________________________________________   DIFFERENTIAL includes but not limited to  Deep vein thrombosis, thrombophlebitis, cellulitis, musculoskeletal pain   PROCEDURES  Procedure(s) performed: no  Procedures  Critical Care performed: no  Observation: no ____________________________________________   INITIAL IMPRESSION / ASSESSMENT AND PLAN / ED COURSE  Pertinent labs & imaging results that were  available during my care of the patient were reviewed by me and considered in my medical decision making (see chart for details).  By the time I saw the patient he had already had an ultrasound which was negative.  Blood work reassuring.  No evidence of cellulitis.  This most likely represents tendinitis versus other musculoskeletal strain.  His pain is improved with ibuprofen.  Strict return precautions have been given and the patient verbalized understanding and agreement with the plan.      ____________________________________________   FINAL CLINICAL IMPRESSION(S) / ED DIAGNOSES  Final diagnoses:  Pain of right lower extremity      NEW MEDICATIONS STARTED DURING THIS VISIT:  This SmartLink is deprecated. Use AVSMEDLIST instead to display the medication list for a patient.   Note:  This document was prepared using Dragon voice recognition software and may include unintentional dictation errors.      Merrily Brittle, MD 08/26/17 2248

## 2017-08-26 NOTE — Discharge Instructions (Signed)
Fortunately today your blood work and your ultrasound were very reassuring.  Please take ibuprofen 3 times a day as needed for pain and follow-up with your primary care physician as needed.  Return to the emergency department for any concerns whatsoever.  It was a pleasure to take care of you today, and thank you for coming to our emergency department.  If you have any questions or concerns before leaving please ask the nurse to grab me and I'm more than happy to go through your aftercare instructions again.  If you were prescribed any opioid pain medication today such as Norco, Vicodin, Percocet, morphine, hydrocodone, or oxycodone please make sure you do not drive when you are taking this medication as it can alter your ability to drive safely.  If you have any concerns once you are home that you are not improving or are in fact getting worse before you can make it to your follow-up appointment, please do not hesitate to call 911 and come back for further evaluation.  Merrily BrittleNeil Keyon Winnick, MD  Results for orders placed or performed during the hospital encounter of 08/26/17  CBC  Result Value Ref Range   WBC 6.4 3.8 - 10.6 K/uL   RBC 5.95 (H) 4.40 - 5.90 MIL/uL   Hemoglobin 16.5 13.0 - 18.0 g/dL   HCT 16.149.7 09.640.0 - 04.552.0 %   MCV 83.5 80.0 - 100.0 fL   MCH 27.8 26.0 - 34.0 pg   MCHC 33.3 32.0 - 36.0 g/dL   RDW 40.914.0 81.111.5 - 91.414.5 %   Platelets 176 150 - 440 K/uL  Comprehensive metabolic panel  Result Value Ref Range   Sodium 138 135 - 145 mmol/L   Potassium 3.3 (L) 3.5 - 5.1 mmol/L   Chloride 102 101 - 111 mmol/L   CO2 26 22 - 32 mmol/L   Glucose, Bld 107 (H) 65 - 99 mg/dL   BUN 14 6 - 20 mg/dL   Creatinine, Ser 7.821.00 0.61 - 1.24 mg/dL   Calcium 9.7 8.9 - 95.610.3 mg/dL   Total Protein 7.7 6.5 - 8.1 g/dL   Albumin 4.9 3.5 - 5.0 g/dL   AST 59 (H) 15 - 41 U/L   ALT 84 (H) 17 - 63 U/L   Alkaline Phosphatase 58 38 - 126 U/L   Total Bilirubin 0.8 0.3 - 1.2 mg/dL   GFR calc non Af Amer >60 >60 mL/min     GFR calc Af Amer >60 >60 mL/min   Anion gap 10 5 - 15  Glucose, capillary  Result Value Ref Range   Glucose-Capillary 105 (H) 65 - 99 mg/dL   [place] Venous Img Lower Unilateral Right  Result Date: 08/26/2017 CLINICAL DATA:  Right lower extremity edema, pain and erythema. EXAM: RIGHT LOWER EXTREMITY VENOUS DOPPLER ULTRASOUND TECHNIQUE: Gray-scale sonography with graded compression, as well as color Doppler and duplex ultrasound were performed to evaluate the lower extremity deep venous systems from the level of the common femoral vein and including the common femoral, femoral, profunda femoral, popliteal and calf veins including the posterior tibial, peroneal and gastrocnemius veins when visible. The superficial great saphenous vein was also interrogated. Spectral Doppler was utilized to evaluate flow at rest and with distal augmentation maneuvers in the common femoral, femoral and popliteal veins. COMPARISON:  07/03/2016 FINDINGS: Contralateral Common Femoral Vein: Respiratory phasicity is normal and symmetric with the symptomatic side. No evidence of thrombus. Normal compressibility. Common Femoral Vein: No evidence of thrombus. Normal compressibility, respiratory phasicity and response to augmentation.  Saphenofemoral Junction: No evidence of thrombus. Normal compressibility and flow on color Doppler imaging. Profunda Femoral Vein: No evidence of thrombus. Normal compressibility and flow on color Doppler imaging. Femoral Vein: No evidence of thrombus. Normal compressibility, respiratory phasicity and response to augmentation. Popliteal Vein: No evidence of thrombus. Normal compressibility, respiratory phasicity and response to augmentation. Calf Veins: No evidence of thrombus. Normal compressibility and flow on color Doppler imaging. Superficial Great Saphenous Vein: No evidence of thrombus. Normal compressibility. Venous Reflux:  None. Other Findings: No evidence of superficial thrombophlebitis or abnormal  fluid collection. IMPRESSION: No evidence of right lower extremity deep venous thrombosis. Electronically Signed   By: Irish LackGlenn  Yamagata M.D.   On: 08/26/2017 16:42

## 2017-08-26 NOTE — ED Triage Notes (Signed)
Pt states he was sent her by GI doctor walk-in clinic.  Pt states that he is scheduled to have surgery on the 29th, but the doctor wants him to have leg checked out for possible blood clot.  Pt states R leg is swollen and red.  Pt states he is a diabetic.  Pt is A&Ox4, wheelchair to triage.

## 2017-09-30 DIAGNOSIS — M4317 Spondylolisthesis, lumbosacral region: Secondary | ICD-10-CM | POA: Insufficient documentation

## 2017-09-30 DIAGNOSIS — M5417 Radiculopathy, lumbosacral region: Secondary | ICD-10-CM | POA: Insufficient documentation

## 2017-10-14 ENCOUNTER — Encounter: Payer: Self-pay | Admitting: Emergency Medicine

## 2017-10-14 ENCOUNTER — Other Ambulatory Visit: Payer: Self-pay

## 2017-10-14 ENCOUNTER — Emergency Department
Admission: EM | Admit: 2017-10-14 | Discharge: 2017-10-14 | Disposition: A | Discharge status: Home / Self Care | Payer: Medicare Other | Attending: Emergency Medicine | Admitting: Emergency Medicine

## 2017-10-14 DIAGNOSIS — G8918 Other acute postprocedural pain: Secondary | ICD-10-CM | POA: Insufficient documentation

## 2017-10-14 DIAGNOSIS — I10 Essential (primary) hypertension: Secondary | ICD-10-CM | POA: Diagnosis not present

## 2017-10-14 DIAGNOSIS — Z7984 Long term (current) use of oral hypoglycemic drugs: Secondary | ICD-10-CM | POA: Diagnosis not present

## 2017-10-14 DIAGNOSIS — G8929 Other chronic pain: Secondary | ICD-10-CM | POA: Diagnosis not present

## 2017-10-14 DIAGNOSIS — Z79899 Other long term (current) drug therapy: Secondary | ICD-10-CM | POA: Insufficient documentation

## 2017-10-14 DIAGNOSIS — F419 Anxiety disorder, unspecified: Secondary | ICD-10-CM | POA: Diagnosis present

## 2017-10-14 DIAGNOSIS — E119 Type 2 diabetes mellitus without complications: Secondary | ICD-10-CM | POA: Insufficient documentation

## 2017-10-14 MED ORDER — OXYCODONE-ACETAMINOPHEN 5-325 MG PO TABS: 1.0000 | ORAL_TABLET | Freq: Four times a day (QID) | ORAL | 0 refills | Status: AC | PRN

## 2017-10-14 NOTE — ED Provider Notes (Signed)
[facility] Emergency Department Provider Note ____________________________________________   First MD Initiated Contact with Patient 10/14/17 0354     (approximate)  I have reviewed the triage vital signs and the nursing notes.   HISTORY  Chief Complaint Panic Attack    HPI Todd [name] is a 52 y.o. 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  The patient states that the pain keeps him up at night and exacerbates his anxiety.  Patient was started on lorazepam within the last few days for anxiety but states it is not helping it.  He states that he got Percocet prescribed after the surgery, and just ran out of it and so for the last 1-2 nights he has had increased pain.  He denies any bleeding, swelling, pus drainage, dehiscence, or any other acute symptoms related to the surgery, and denies any trauma to the amputated left lower extremity.  He denies any other acute psychiatric symptoms.  Past Medical History:  Diagnosis Date  . Anxiety   . 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 Chronic left shoulder pain 02/15/2016  . Depression   . Diabetes mellitus without complication (HCC)   . Dislocation of right thumb 06/05/2016  . Gastroparesis 06/08/2017  . GERD (gastroesophageal reflux disease)   . HTN (hypertension) 05/19/2016  . Hypertension   . MDD (major depressive disorder), recurrent episode (HCC) 05/28/2016  . Neck pain, bilateral 02/15/2016  . Primary osteoarthritis of right knee 01/31/2016  . PTSD (post-traumatic stress disorder) 06/05/2016  . Severe recurrent major depression without psychotic features (HCC) 05/19/2016  . Sleep apnea   . Substance induced mood disorder (HCC)  05/19/2016  . Suicidal ideation 05/19/2016  . Type 2 diabetes mellitus with complication, without long-term current use of insulin (HCC) 06/08/2017  . Uncontrolled type 2 diabetes mellitus with hyperglycemia, without long-term current use of insulin (HCC) 03/12/2017    Patient Active Problem List   Diagnosis Date Noted  . 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 Gastroparesis 06/08/2017  . History of noncompliance with medical treatment 06/08/2017  . Type 2 diabetes mellitus with complication, without long-term current use of insulin (HCC) 06/08/2017  . Uncontrolled type 2 diabetes mellitus with hyperglycemia, without long-term current use of insulin (HCC) 03/12/2017  . 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 Dislocation of right thumb 06/05/2016  . PTSD (post-traumatic stress disorder) 06/05/2016  . MDD (major depressive disorder), recurrent episode (HCC) 05/28/2016  . Substance induced mood disorder (HCC) 05/19/2016  . Severe recurrent major depression without psychotic features (HCC) 05/19/2016  . Suicidal ideation 05/19/2016  . HTN (hypertension) 05/19/2016  . GERD (gastroesophageal reflux disease) 05/19/2016  . Alcohol use disorder, mild, abuse 05/19/2016  . Chronic left shoulder pain 02/15/2016  . Neck pain, bilateral 02/15/2016  . Primary osteoarthritis of right knee 01/31/2016    Past Surgical History:  Procedure Laterality Date  . BACK SURGERY    . LEG AMPUTATION BELOW KNEE     left  . SHOULDER SURGERY      Prior to Admission medications   Medication Sig Start Date End Date Taking? Authorizing Provider  B-D UF III MINI PEN NEEDLES 31G X 5 MM MISC  05/04/17   [provider]  cloNIDine (  CATAPRES) 0.2 MG tablet  04/26/17   [provider]  desvenlafaxine (PRISTIQ) 50 MG 24 hr tablet Take 50 mg by mouth daily. 06/24/17   [provider]  doxepin (SINEQUAN) 50 MG capsule  04/26/17   [provider]  DULoxetine (CYMBALTA) 30 MG capsule Take 30 mg by  mouth 2 (two) times daily. 06/15/17   [provider]  FARXIGA 10 MG TABS tablet  07/21/17   [provider]  gabapentin (NEURONTIN) 600 MG tablet TAKE 1 TABLET BY MOUTH FOUR TIMES A DAY FOR PAIN 05/04/17   [provider]  glipiZIDE (GLUCOTROL XL) 10 MG 24 hr tablet  07/12/17   [provider]  hydrochlorothiazide (HYDRODIURIL) 25 MG tablet take 1 tablet by mouth once daily for BLOOD PRESSURE 05/04/17   [provider]  IBU 800 MG tablet  04/29/17   [provider]  ibuprofen (ADVIL,MOTRIN) 600 MG tablet Take 1 tablet (600 mg total) every 8 (eight) hours as needed by mouth. 08/26/17   Merrily Brittle, MD  lisinopril (PRINIVIL,ZESTRIL) 40 MG tablet Take 1 tablet (40 mg total) by mouth daily. 05/20/16   Pucilowska, Braulio Conte B, MD  metoCLOPramide (REGLAN) 10 MG tablet Take 1 tablet (10 mg total) by mouth 4 (four) times daily -  before meals and at bedtime. 01/02/16   Sharman Cheek, MD  [facility] DELICA LANCETS FINE MISC USE TO TEST three times a day 05/29/17   [provider]  [facility] VERIO test strip  06/23/17   [provider]  oxyCODONE-acetaminophen (ROXICET) 5-325 MG tablet Take 1 tablet by mouth every 6 (six) hours as needed for up to 2 days for severe pain. 10/14/17 10/16/17  Dionne Bucy, MD  sucralfate (CARAFATE) 1 g tablet Take 1 tablet (1 g total) by mouth 4 (four) times daily. 01/02/16   Sharman Cheek, MD    Allergies Benadryl [diphenhydramine] and Diphenhydramine hcl  No family history on file.  Social History Social History   Tobacco Use  . Smoking status: Never Smoker  . Smokeless tobacco: Never Used  Substance Use Topics  . Alcohol use: No  . Drug use: No    Review of Systems  Constitutional: No fever. Eyes: No redness. Cardiovascular: Denies chest pain. Respiratory: Denies shortness of breath. Gastrointestinal: No vomiting.  Musculoskeletal: Positive for postoperative back pain. Skin: Negative  for rash. Neurological: Negative for headache.   ____________________________________________   PHYSICAL EXAM:  VITAL SIGNS: ED Triage Vitals [10/14/17 0204]  Enc Vitals Group     BP 136/90     Pulse Rate 90     Resp 20     Temp 97.9 F (36.6 C)     Temp Source Oral     SpO2 97 %     Weight 252 lb (114.3 kg)     Height 5\' 8"  (1.727 m)     Head Circumference      Peak Flow      Pain Score 8     Pain Loc      Pain Edu?      Excl. in GC?     Constitutional: Alert and oriented. Well appearing and in no acute distress. Eyes: Conjunctivae are normal.  Head: Atraumatic. Nose: No congestion/rhinnorhea. Mouth/Throat: Mucous membranes are moist.   Neck: Normal range of motion.  Cardiovascular:   Good peripheral circulation. Respiratory: Normal respiratory effort.   Gastrointestinal: No distention.  Musculoskeletal:Extremities warm and well perfused.  Neurologic:  Normal speech and language. No gross focal neurologic deficits  are appreciated.  Skin:  Skin is warm and dry. No rash noted. Psychiatric: Mood and affect are normal. Speech and behavior are normal.  ____________________________________________   LABS (all labs ordered are listed, but only abnormal results are displayed)  Labs Reviewed - No data to display ____________________________________________  EKG   ____________________________________________  RADIOLOGY    ____________________________________________   PROCEDURES  Procedure(s) performed: No    Critical Care performed: No ____________________________________________   INITIAL IMPRESSION / ASSESSMENT AND PLAN / ED COURSE  Pertinent labs & imaging results that were available during my care of the patient were reviewed by me and considered in my medical decision making (see chart for details).  52 year old male with past medical history as noted above presents primarily for pain.  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  He states that he ran out of his Percocet that he had been taking since the surgery, and in the last few nights the increased pain to these two areas has made him anxious.  He denies any other new symptoms to either of these areas or any postsurgical complications.  He denies any other psychiatric symptoms.  On exam, the patient is well-appearing, the vital signs are normal, and the remainder the exam is unremarkable he is appropriate and calm.  Although the chief complaint at triage was anxiety, the primary concern appears to be his pain.  I confirmed the patient's prescription history in the PMP Registry and confirmed that he was most recently prescribed for a 2-week supply of oxycodone on 1220 which is consistent with the history that he gave me.  Patient states he is following up with his surgeon tomorrow, so I will give a 1.5-day supply of Percocet (6 pills) to cover him until he can see the surgeon.  The patient is already on anxiety medication.  Is no indication at this time for psychiatric evaluation, or any further workup in the emergency department.  Return precautions given and the patient expresses understanding.      ____________________________________________   FINAL CLINICAL IMPRESSION(S) / ED DIAGNOSES  Final diagnoses:  Postoperative pain      NEW MEDICATIONS STARTED DURING THIS VISIT:  This SmartLink is deprecated. Use AVSMEDLIST instead to display the medication list for a patient.   Note:  This document was prepared using Dragon voice recognition software and may include unintentional dictation errors.    Dionne BucySiadecki, Giancarlo Askren, MD 10/14/17 [phone]

## 2017-10-14 NOTE — ED Notes (Signed)

## 2017-10-14 NOTE — Discharge Instructions (Signed)
Follow-up with your surgeon tomorrow as scheduled.  Return to the emergency department for new or worsening pain, anxiety, fevers, bleeding, weakness, or any other new or worsening symptoms that concern you.

## 2017-10-14 NOTE — ED Triage Notes (Addendum)
Pt ambulatory to triage with aid of walker, no distress noted; pt reports last 3 days having phantom pain to left stump and having panic attacks from them but not getting relief from his ativan 0.5mg  (last ds 9pm)

## 2017-10-14 NOTE — ED Notes (Signed)
Pt reports for the last 3 days having interm "electric pain in left leg stump." Pt states pain is worse at night and he can not sleep. Pt states "I have anxiety so I worry about it." Pt reports he was started on ativan yest without relief. Pt denies new inj to left leg stump location. Pt also reports having back surgery 1 month ago where he was in the hospital for a month. Pt denies new or worsening changes to surgery location.

## 2017-11-14 IMAGING — CT CT HEAD W/O CM
3 series · 15 of 46 positions shown, 18 images · non-contrast
Comparison: None.

CLINICAL DATA: Patient with facial numbness and tongue numbness
with tingling along the right side of the face with onset at 5800
this afternoon. Patient with slight asymmetry in smile but no
slurred speech is noted. Able raise eyebrows. Not a code stroke per
RN.

EXAM:
CT HEAD WITHOUT CONTRAST
TECHNIQUE: Contiguous axial images were obtained from the base of the skull
through the vertex without intravenous contrast.

[Series 2: head wo · axial · 0.46mm/px · z∈[+454,+574]mm · 9 of 29 slices shown, 12 images]
[im 3/29  brain]
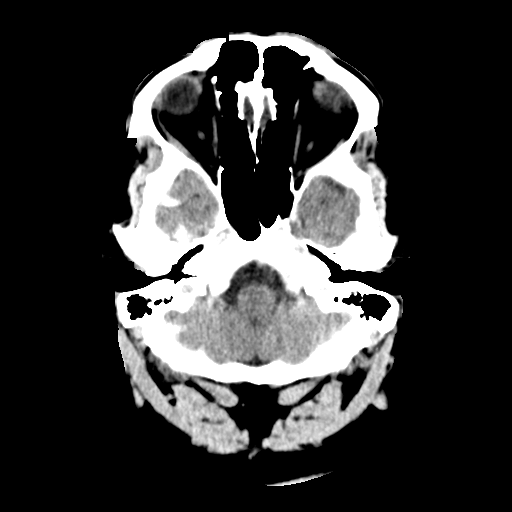
[im 3/29  bone]
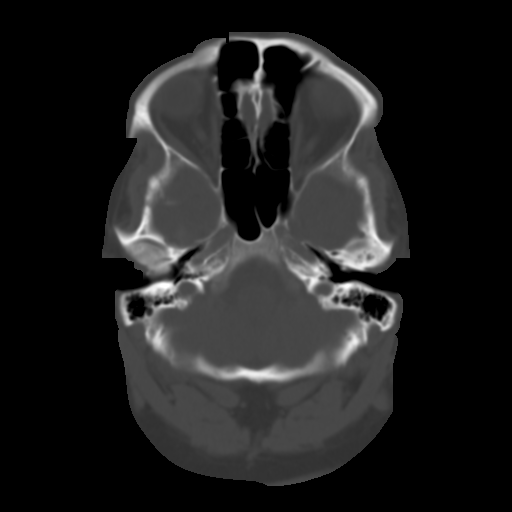
[im 6/29  brain]
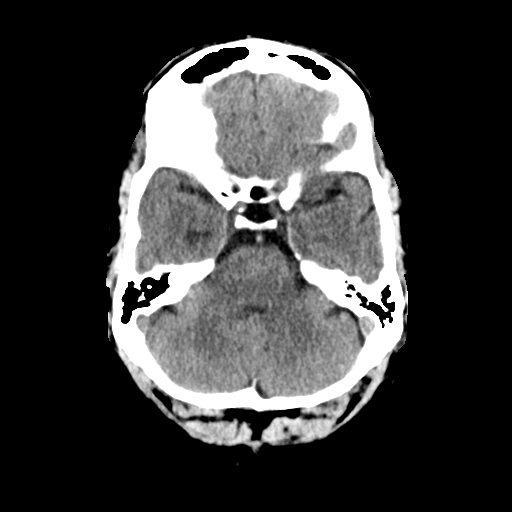
[im 9/29  brain]
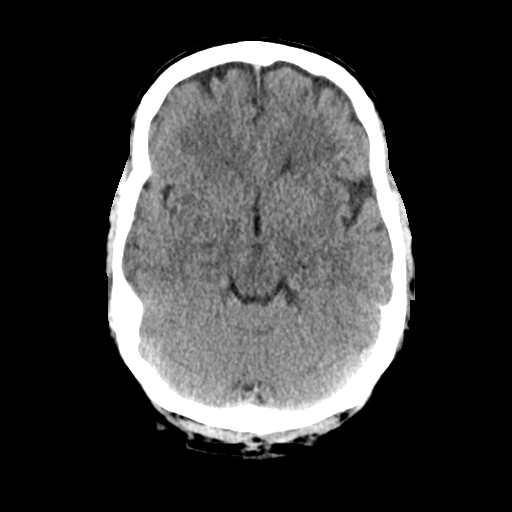
[im 12/29  brain]
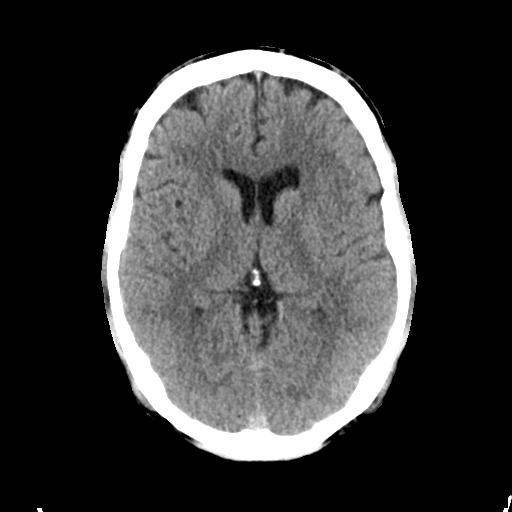
[im 15/29  brain]
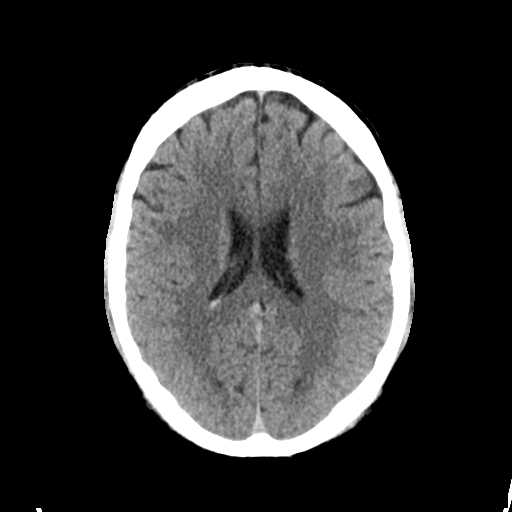
[im 15/29  bone]
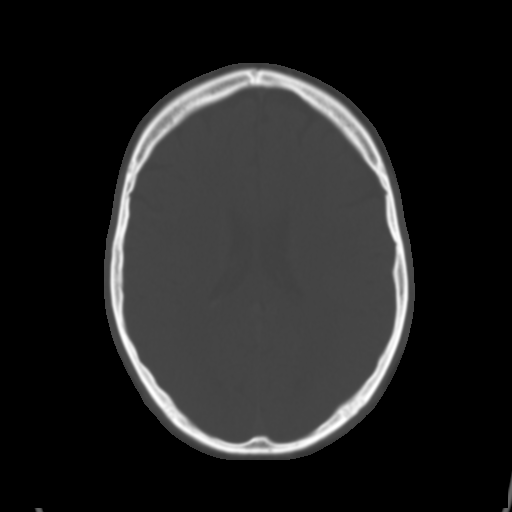
[im 18/29  brain]
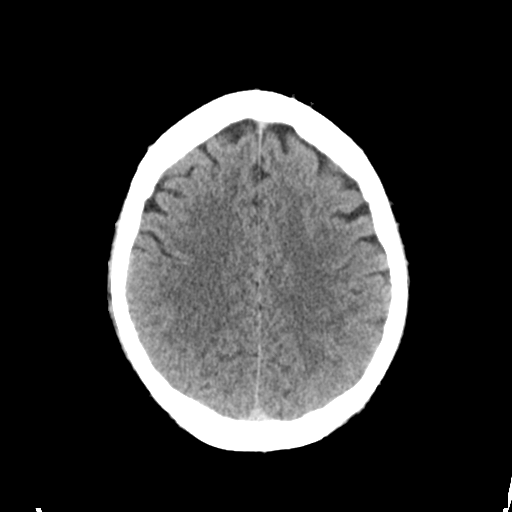
[im 21/29  brain]
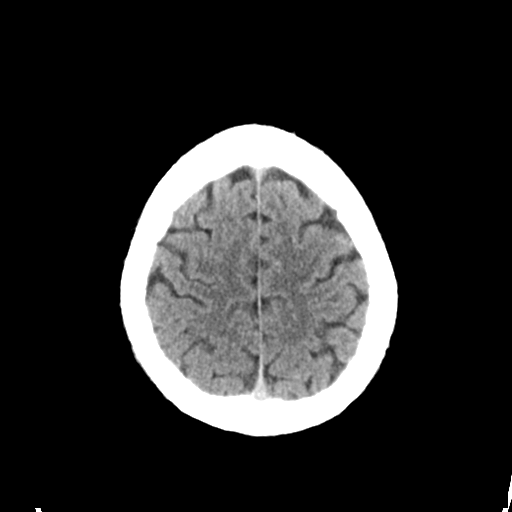
[im 24/29  brain]
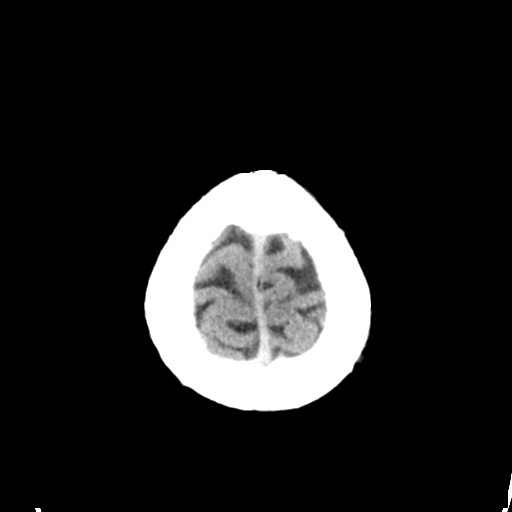
[im 27/29  brain]
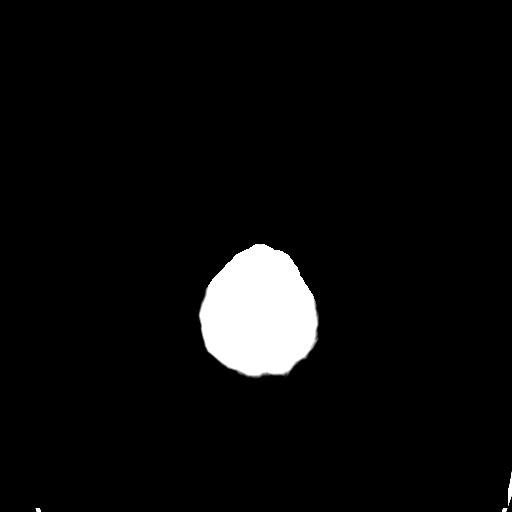
[im 27/29  bone]
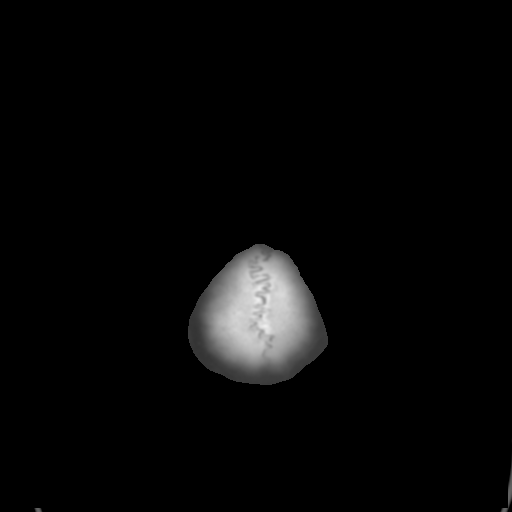

[Series 4: coronal soft tissue · coronal · 0.30mm/px · 3 of 65 slices shown]
[im 22/65  brain]
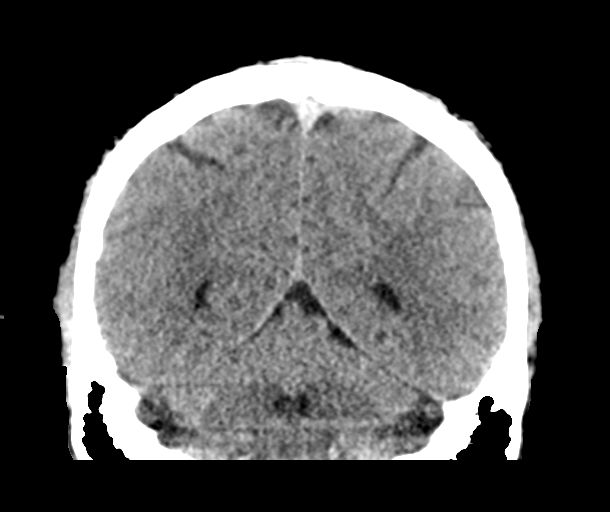
[im 29/65  brain]
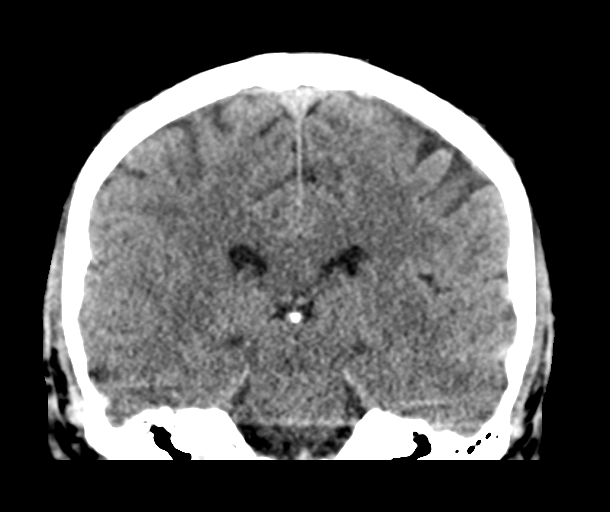
[im 36/65  brain]
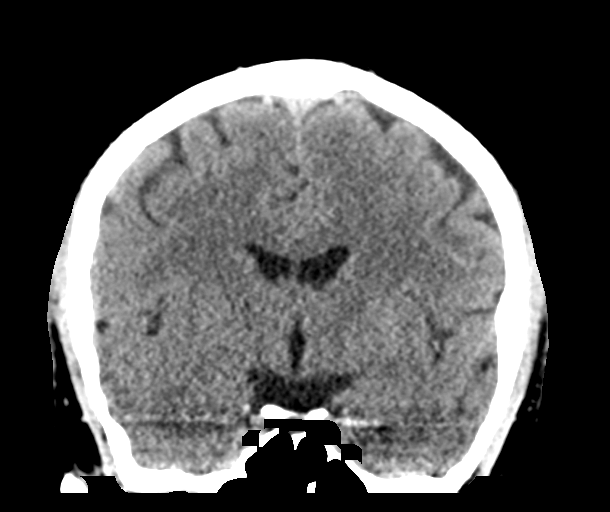

[Series 5: sagittal soft tissue · sagittal · 0.29mm/px · 3 of 52 slices shown]
[im 18/52  brain]
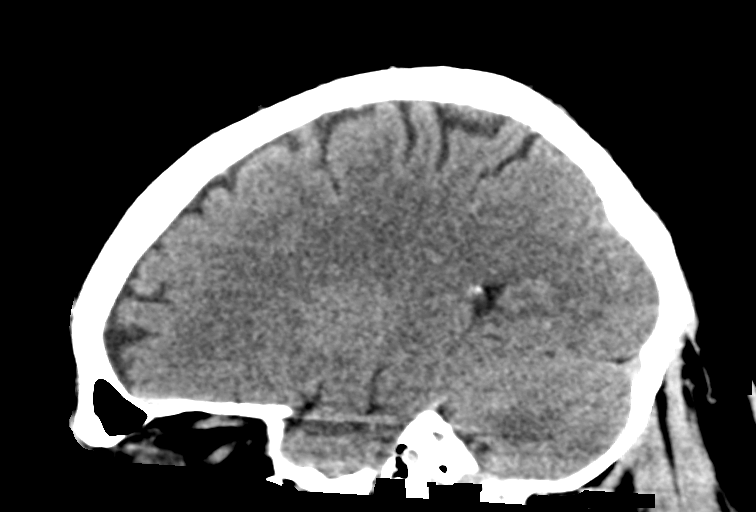
[im 26/52  brain]
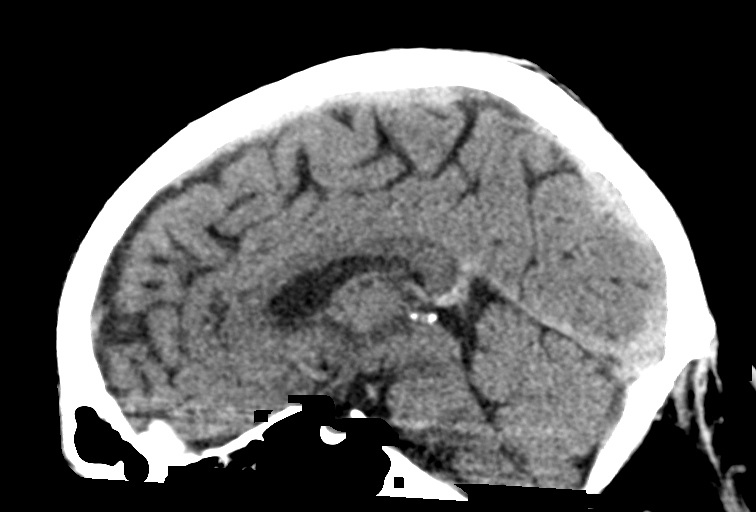
[im 35/52  brain]
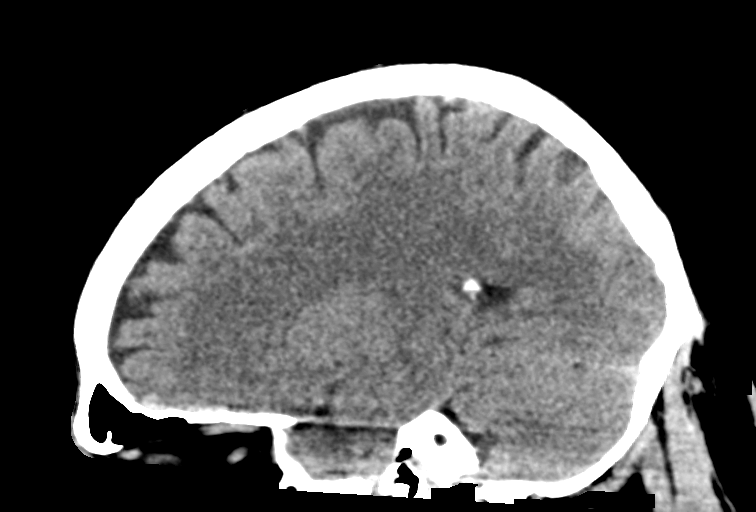

[15 of 46 positions shown; findings below may reference images not displayed]

FINDINGS: Brain: No evidence of acute infarction, hemorrhage, hydrocephalus,
extra-axial collection or mass lesion/mass effect.

Vascular: No hyperdense vessel or unexpected calcification.

Skull: Normal. Negative for fracture or focal lesion.

Sinuses/Orbits: No acute finding.

Other: None.
IMPRESSION: Negative exam.

## 2018-01-14 IMAGING — US US EXTREM LOW VENOUS*R*
1 series · 13 of 24 positions shown · non-contrast
Comparison: 07/03/2016

CLINICAL DATA: Right lower extremity edema, pain and erythema.



[Series 1: us extrem low venous*right* · 0.10mm/px · 13 of 35 slices shown]
[im 1/35]
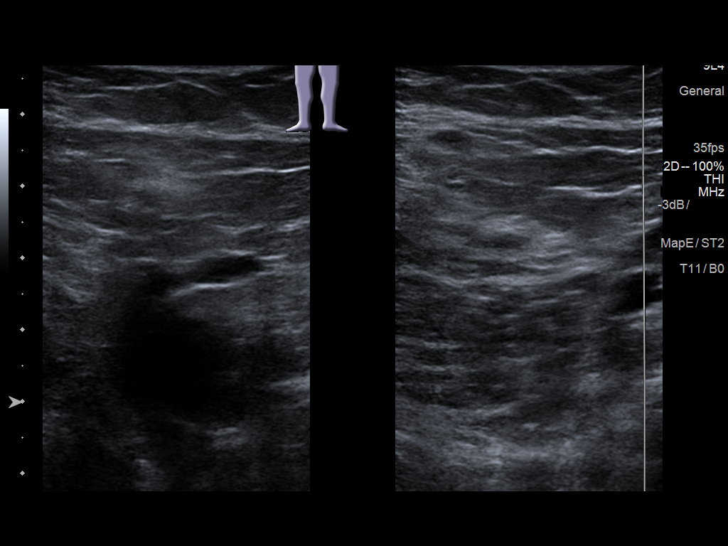
[im 3/35]
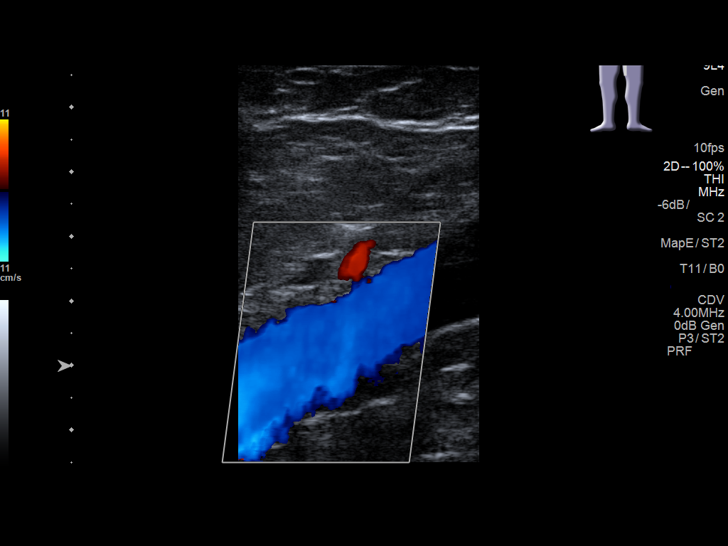
[im 6/35]
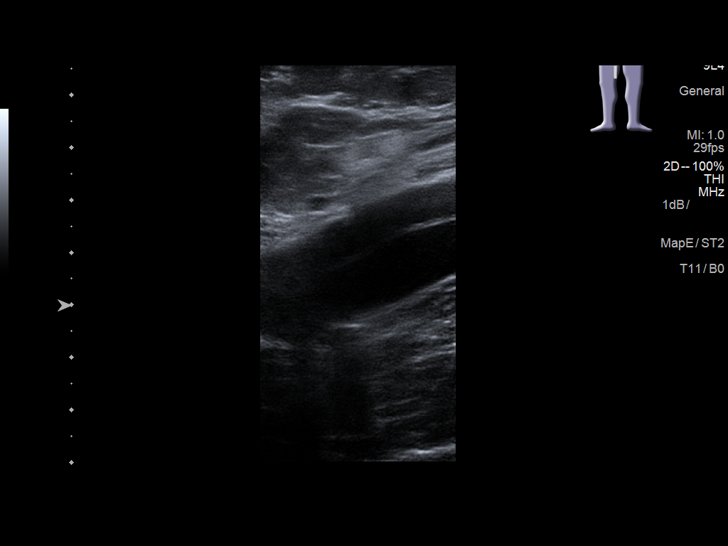
[im 9/35]
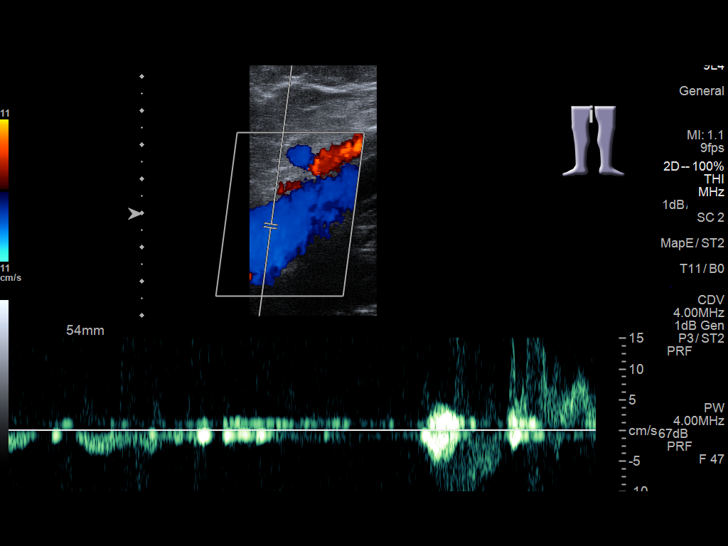
[im 12/35]
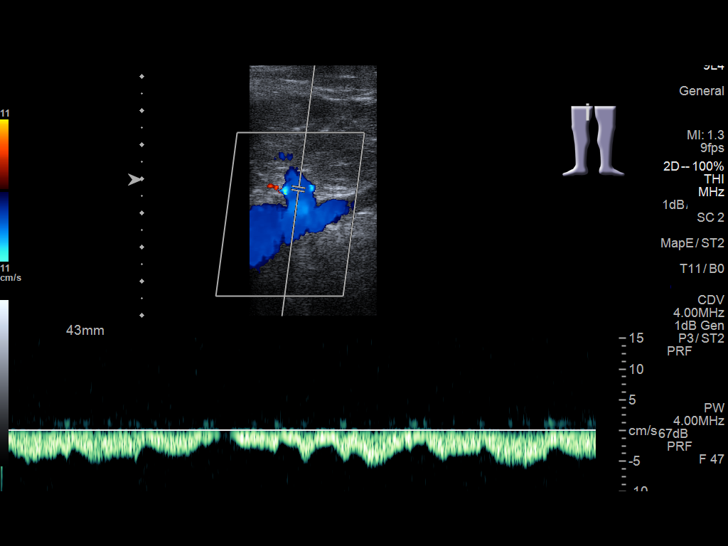
[im 15/35]
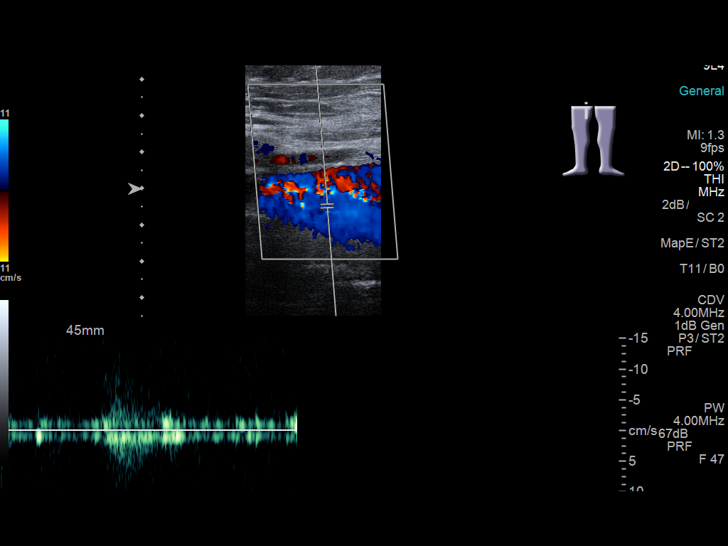
[im 18/35]
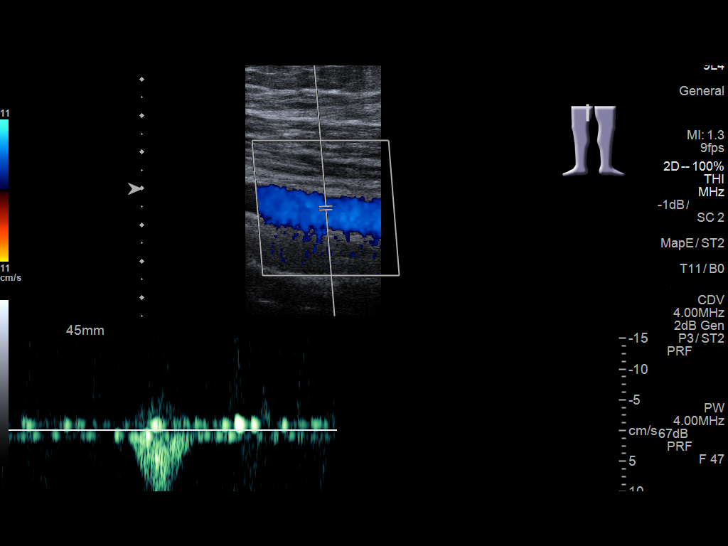
[im 20/35]
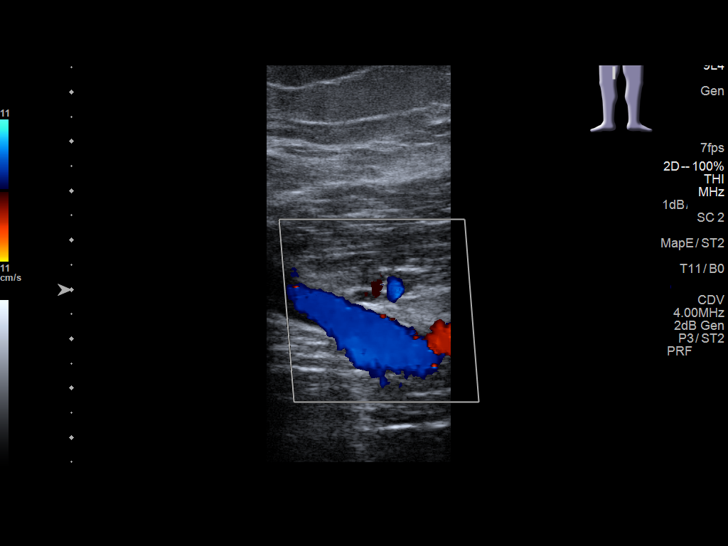
[im 23/35]
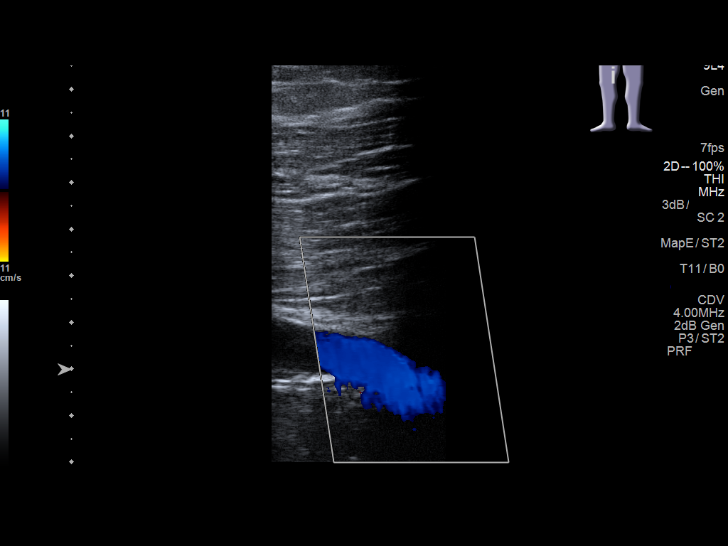
[im 26/35]
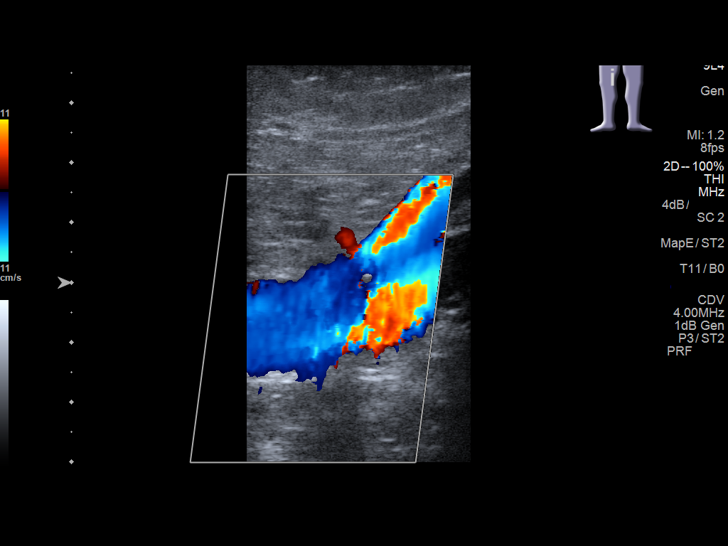
[im 29/35]
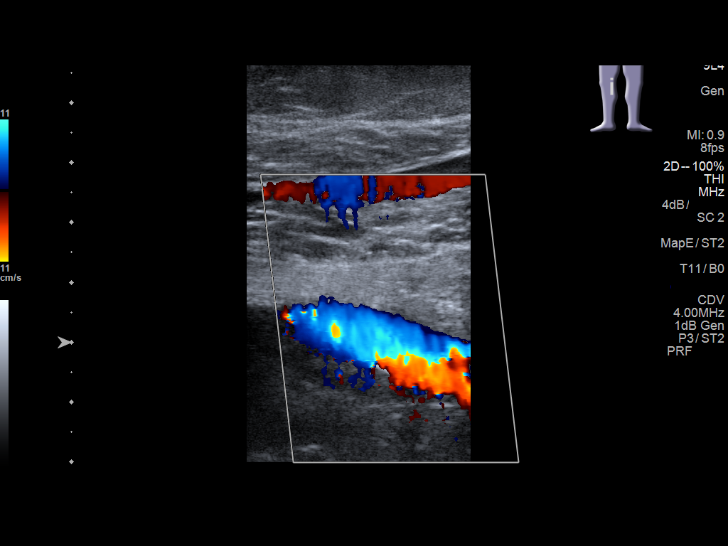
[im 32/35]
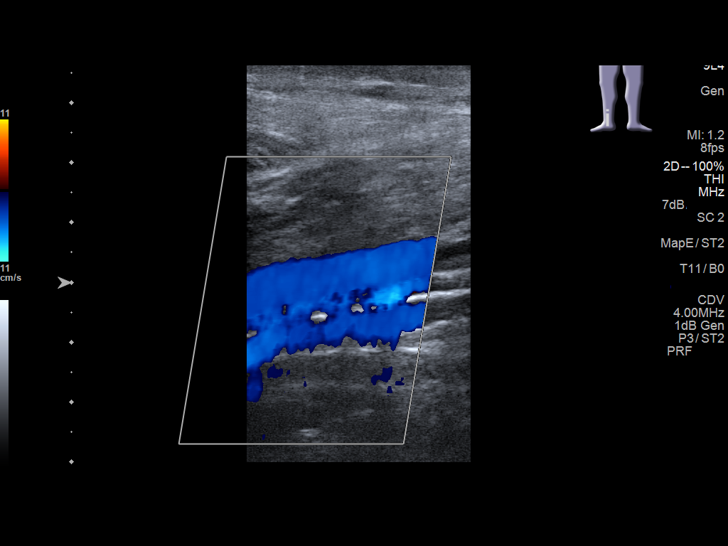
[im 35/35]
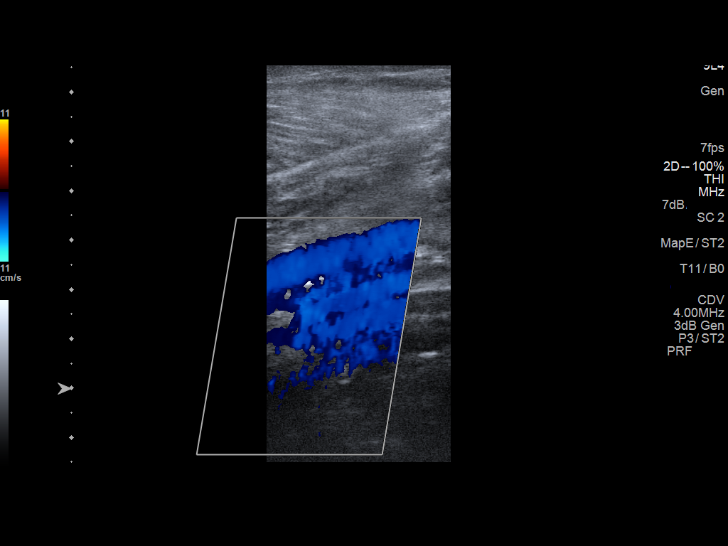

[13 of 24 positions shown; findings below may reference images not displayed]

FINDINGS: Contralateral Common Femoral Vein: Respiratory phasicity is normal
and symmetric with the symptomatic side. No evidence of thrombus.
Normal compressibility.

Common Femoral Vein: No evidence of thrombus. Normal
compressibility, respiratory phasicity and response to augmentation.

Saphenofemoral Junction: No evidence of thrombus. Normal
compressibility and flow on color Doppler imaging.

Profunda Femoral Vein: No evidence of thrombus. Normal
compressibility and flow on color Doppler imaging.

Femoral Vein: No evidence of thrombus. Normal compressibility,
respiratory phasicity and response to augmentation.

Popliteal Vein: No evidence of thrombus. Normal compressibility,
respiratory phasicity and response to augmentation.

Calf Veins: No evidence of thrombus. Normal compressibility and flow
on color Doppler imaging.

Superficial Great Saphenous Vein: No evidence of thrombus. Normal
compressibility.

Venous Reflux:  None.

Other Findings: No evidence of superficial thrombophlebitis or
abnormal fluid collection.
IMPRESSION: No evidence of right lower extremity deep venous thrombosis.

## 2018-01-31 ENCOUNTER — Encounter
Admission: RE | Disposition: A | Discharge status: Home / Self Care | Payer: Self-pay | Source: Ambulatory Visit | Attending: Unknown Physician Specialty

## 2018-01-31 ENCOUNTER — Other Ambulatory Visit: Payer: Self-pay

## 2018-01-31 ENCOUNTER — Ambulatory Visit: Payer: Medicare Other | Admitting: Anesthesiology

## 2018-01-31 ENCOUNTER — Ambulatory Visit
Admission: RE | Admit: 2018-01-31 | Discharge: 2018-01-31 | Disposition: A | Discharge status: Home / Self Care | Payer: Medicare Other | Source: Ambulatory Visit | Attending: Unknown Physician Specialty | Admitting: Unknown Physician Specialty

## 2018-01-31 DIAGNOSIS — F4310 Post-traumatic stress disorder, unspecified: Secondary | ICD-10-CM | POA: Diagnosis not present

## 2018-01-31 DIAGNOSIS — Z79899 Other long term (current) drug therapy: Secondary | ICD-10-CM | POA: Diagnosis not present

## 2018-01-31 DIAGNOSIS — Z7982 Long term (current) use of aspirin: Secondary | ICD-10-CM | POA: Diagnosis not present

## 2018-01-31 DIAGNOSIS — Z89512 Acquired absence of left leg below knee: Secondary | ICD-10-CM | POA: Insufficient documentation

## 2018-01-31 DIAGNOSIS — M19049 Primary osteoarthritis, unspecified hand: Secondary | ICD-10-CM | POA: Diagnosis not present

## 2018-01-31 DIAGNOSIS — Z1211 Encounter for screening for malignant neoplasm of colon: Secondary | ICD-10-CM | POA: Insufficient documentation

## 2018-01-31 DIAGNOSIS — K760 Fatty (change of) liver, not elsewhere classified: Secondary | ICD-10-CM | POA: Diagnosis not present

## 2018-01-31 DIAGNOSIS — Z888 Allergy status to other drugs, medicaments and biological substances status: Secondary | ICD-10-CM | POA: Diagnosis not present

## 2018-01-31 DIAGNOSIS — K219 Gastro-esophageal reflux disease without esophagitis: Secondary | ICD-10-CM | POA: Diagnosis not present

## 2018-01-31 DIAGNOSIS — K2970 Gastritis, unspecified, without bleeding: Secondary | ICD-10-CM | POA: Insufficient documentation

## 2018-01-31 DIAGNOSIS — I10 Essential (primary) hypertension: Secondary | ICD-10-CM | POA: Diagnosis not present

## 2018-01-31 DIAGNOSIS — F419 Anxiety disorder, unspecified: Secondary | ICD-10-CM | POA: Insufficient documentation

## 2018-01-31 DIAGNOSIS — F329 Major depressive disorder, single episode, unspecified: Secondary | ICD-10-CM | POA: Diagnosis not present

## 2018-01-31 DIAGNOSIS — Z6841 Body Mass Index (BMI) 40.0 and over, adult: Secondary | ICD-10-CM | POA: Insufficient documentation

## 2018-01-31 DIAGNOSIS — Z7984 Long term (current) use of oral hypoglycemic drugs: Secondary | ICD-10-CM | POA: Diagnosis not present

## 2018-01-31 DIAGNOSIS — G4730 Sleep apnea, unspecified: Secondary | ICD-10-CM | POA: Insufficient documentation

## 2018-01-31 DIAGNOSIS — K5790 Diverticulosis of intestine, part unspecified, without perforation or abscess without bleeding: Secondary | ICD-10-CM | POA: Insufficient documentation

## 2018-01-31 DIAGNOSIS — K3184 Gastroparesis: Secondary | ICD-10-CM | POA: Diagnosis not present

## 2018-01-31 DIAGNOSIS — K449 Diaphragmatic hernia without obstruction or gangrene: Secondary | ICD-10-CM | POA: Diagnosis not present

## 2018-01-31 DIAGNOSIS — M1711 Unilateral primary osteoarthritis, right knee: Secondary | ICD-10-CM | POA: Diagnosis not present

## 2018-01-31 DIAGNOSIS — K640 First degree hemorrhoids: Secondary | ICD-10-CM | POA: Diagnosis not present

## 2018-01-31 DIAGNOSIS — K635 Polyp of colon: Secondary | ICD-10-CM | POA: Diagnosis not present

## 2018-01-31 DIAGNOSIS — Z86010 Personal history of colonic polyps: Secondary | ICD-10-CM | POA: Diagnosis not present

## 2018-01-31 DIAGNOSIS — E1143 Type 2 diabetes mellitus with diabetic autonomic (poly)neuropathy: Secondary | ICD-10-CM | POA: Insufficient documentation

## 2018-01-31 HISTORY — PX: ESOPHAGOGASTRODUODENOSCOPY (EGD) WITH PROPOFOL: SHX5813

## 2018-01-31 HISTORY — PX: COLONOSCOPY WITH PROPOFOL: SHX5780

## 2018-01-31 LAB — GLUCOSE, CAPILLARY: GLUCOSE-CAPILLARY: 117 mg/dL — AB (ref 65–99)

## 2018-01-31 SURGERY — ESOPHAGOGASTRODUODENOSCOPY (EGD) WITH PROPOFOL
Anesthesia: General

## 2018-01-31 MED ORDER — FENTANYL CITRATE (PF) 100 MCG/2ML IJ SOLN
INTRAMUSCULAR | Status: AC
  Filled 2018-01-31: qty 2

## 2018-01-31 MED ORDER — SODIUM CHLORIDE 0.9 % IV SOLN: INTRAVENOUS | Status: DC

## 2018-01-31 MED ORDER — MIDAZOLAM HCL 2 MG/2ML IJ SOLN
INTRAMUSCULAR | Status: AC
  Filled 2018-01-31: qty 2

## 2018-01-31 MED ORDER — FENTANYL CITRATE (PF) 100 MCG/2ML IJ SOLN
INTRAMUSCULAR | Status: DC | PRN
  Administered 2018-01-31 (×2): 50 ug via INTRAVENOUS

## 2018-01-31 MED ORDER — SODIUM CHLORIDE 0.9 % IV SOLN
INTRAVENOUS | Status: DC
  Administered 2018-01-31 (×2): via INTRAVENOUS

## 2018-01-31 MED ORDER — PROPOFOL 500 MG/50ML IV EMUL
INTRAVENOUS | Status: DC | PRN
  Administered 2018-01-31: 75 ug/kg/min via INTRAVENOUS

## 2018-01-31 MED ORDER — PROPOFOL 10 MG/ML IV BOLUS
INTRAVENOUS | Status: DC | PRN
  Administered 2018-01-31: 20 mg via INTRAVENOUS
  Administered 2018-01-31: 30 mg via INTRAVENOUS

## 2018-01-31 MED ORDER — MIDAZOLAM HCL 5 MG/5ML IJ SOLN
INTRAMUSCULAR | Status: DC | PRN
  Administered 2018-01-31 (×2): 2 mg via INTRAVENOUS

## 2018-01-31 MED ORDER — LIDOCAINE HCL (PF) 2 % IJ SOLN
INTRAMUSCULAR | Status: DC | PRN
  Administered 2018-01-31: 100 mg

## 2018-01-31 NOTE — Op Note (Signed)
[facility] Gastroenterology Patient Name: Todd [name] Procedure Date: 01/31/2018 9:44 AM MRN: [id number] Account #: [id number] Date of Birth: 1966-06-03 Admit Type: Outpatient Age: 52 Room: [facility] ENDO ROOM 2 Gender: Male Note Status: Finalized Procedure:            Upper GI endoscopy Indications:          Epigastric abdominal pain, Heartburn Providers:            Scot Jun, MD Referring MD:         Pollyann Samples. Mancheno Revelo (Referring MD) Medicines:            Propofol per Anesthesia Complications:        No immediate complications. Procedure:            Pre-Anesthesia Assessment:                       - After reviewing the risks and benefits, the patient                        was deemed in satisfactory condition to undergo the                        procedure.                       After obtaining informed consent, the endoscope was                        passed under direct vision. Throughout the procedure,                        the patient's blood pressure, pulse, and oxygen                        saturations were monitored continuously. The Endoscope                        was introduced through the mouth, and advanced to the                        second part of duodenum. The upper GI endoscopy was                        accomplished without difficulty. The patient tolerated                        the procedure well. Findings:      LA Grade A (one or more mucosal breaks less than 5 mm, not extending       between tops of 2 mucosal folds) esophagitis with no bleeding was found       40 cm from the incisors. Biopsies were taken with a cold forceps for       histology.      A small hiatal hernia was present.      Patchy mildly erythematous mucosa without bleeding was found in the       gastric antrum. Biopsies were taken with a cold forceps for histology.       Biopsies were taken with a cold forceps for Helicobacter pylori testing.      Patchy  mildly erythematous mucosa without bleeding was found  in the       gastric body.      The examined duodenum was normal. Impression:           - LA Grade A reflux esophagitis. Rule out Barrett's                        esophagus. Biopsied.                       - Small hiatal hernia.                       - Erythematous mucosa in the antrum. Biopsied.                       - Erythematous mucosa in the gastric body.                       - Normal examined duodenum. Recommendation:       - Await pathology results. Scot Junobert T Elsworth Ledin, MD 01/31/2018 10:19:48 AM This report has been signed electronically. Number of Addenda: 0 Note Initiated On: 01/31/2018 9:44 AM      [facility]

## 2018-01-31 NOTE — Anesthesia Post-op Follow-up Note (Signed)
Anesthesia QCDR form completed.        

## 2018-01-31 NOTE — Anesthesia Preprocedure Evaluation (Addendum)
Anesthesia Evaluation  Patient identified by MRN, date of birth, ID band Patient awake    Reviewed: Allergy & Precautions, H&P , NPO status , Patient's Chart, lab work & pertinent test results, reviewed documented beta blocker date and time   History of Anesthesia Complications Negative for: history of anesthetic complications  Airway Mallampati: III  TM Distance: >3 FB Neck ROM: full    Dental  (+) Dental Advidsory Given, Caps   Pulmonary neg shortness of breath, sleep apnea , neg COPD, neg recent URI,           Cardiovascular Exercise Tolerance: Good hypertension, (-) angina(-) CAD, (-) Past MI, (-) Cardiac Stents and (-) CABG (-) dysrhythmias (-) Valvular Problems/Murmurs     Neuro/Psych PSYCHIATRIC DISORDERS Anxiety Depression negative neurological ROS     GI/Hepatic GERD  ,NAFLD   Endo/Other  diabetesMorbid obesity  Renal/GU negative Renal ROS  negative genitourinary   Musculoskeletal   Abdominal   Peds  Hematology negative hematology ROS (+)   Anesthesia Other Findings Past Medical History: No date: Anxiety 11/09/2016: Arthrosis of left acromioclavicular joint 02/15/2016: Chronic left shoulder pain No date: Depression No date: Diabetes mellitus without complication (HCC) 06/05/2016: Dislocation of right thumb 06/08/2017: Gastroparesis No date: GERD (gastroesophageal reflux disease) 05/19/2016: HTN (hypertension) No date: Hypertension 05/28/2016: MDD (major depressive disorder), recurrent episode (HCC) 02/15/2016: Neck pain, bilateral 01/31/2016: Primary osteoarthritis of right knee     Comment:  and fingers 06/05/2016: PTSD (post-traumatic stress disorder) 05/19/2016: Severe recurrent major depression without psychotic  features (HCC) No date: Sleep apnea     Comment:  unable to tolerate CPAP 05/19/2016: Substance induced mood disorder (HCC) 05/19/2016: Suicidal ideation 06/08/2017: Type 2 diabetes mellitus with  complication, without long- term current use of insulin (HCC) 03/12/2017: Uncontrolled type 2 diabetes mellitus with hyperglycemia,  without long-term current use of insulin (HCC)   Reproductive/Obstetrics negative OB ROS                            Anesthesia Physical Anesthesia Plan  ASA: III  Anesthesia Plan: General   Post-op Pain Management:    Induction: Intravenous  PONV Risk Score and Plan: 2 and Propofol infusion  Airway Management Planned: Natural Airway and Nasal Cannula  Additional Equipment:   Intra-op Plan:   Post-operative Plan:   Informed Consent: I have reviewed the patients History and Physical, chart, labs and discussed the procedure including the risks, benefits and alternatives for the proposed anesthesia with the patient or authorized representative who has indicated his/her understanding and acceptance.   Dental Advisory Given  Plan Discussed with: Anesthesiologist, CRNA and Surgeon  Anesthesia Plan Comments:         Anesthesia Quick Evaluation

## 2018-01-31 NOTE — Anesthesia Postprocedure Evaluation (Signed)
Anesthesia Post Note  Patient: [name] Schear  Procedure(s) Performed: ESOPHAGOGASTRODUODENOSCOPY (EGD) WITH PROPOFOL (N/A ) COLONOSCOPY WITH PROPOFOL (N/A )  Patient location during evaluation: Endoscopy Anesthesia Type: General Level of consciousness: awake and alert Pain management: pain level controlled Vital Signs Assessment: post-procedure vital signs reviewed and stable Respiratory status: spontaneous breathing, nonlabored ventilation, respiratory function stable and patient connected to nasal cannula oxygen Cardiovascular status: blood pressure returned to baseline and stable Postop Assessment: no apparent nausea or vomiting Anesthetic complications: no     Last Vitals:  Vitals:   01/31/18 1100 01/31/18 1110  BP: 116/77 124/83  Pulse: 79 67  Resp: 13 18  Temp:    SpO2: 95% 96%    Last Pain:  Vitals:   01/31/18 1042  TempSrc: Tympanic  PainSc:                  Lenard SimmerAndrew Carin Shipp

## 2018-01-31 NOTE — Op Note (Signed)
[facility] Gastroenterology Patient Name: Todd [name] Procedure Date: 01/31/2018 9:42 AM MRN: [id number] Account #: [id number] Date of Birth: 11/24/1965 Admit Type: Outpatient Age: [age] Room: [facility] ENDO ROOM 2 Gender: Male Note Status: Finalized Procedure:            Colonoscopy Indications:          High risk colon cancer surveillance: Personal history                        of colonic polyps Providers:            Scot Junobert T. Elliott, MD Referring MD:         [name] (Referring MD) Medicines:            Propofol per Anesthesia Complications:        No immediate complications. Procedure:            Pre-Anesthesia Assessment:                       - After reviewing the risks and benefits, the patient                        was deemed in satisfactory condition to undergo the                        procedure.                       After obtaining informed consent, the colonoscope was                        passed under direct vision. Throughout the procedure,                        the patient's blood pressure, pulse, and oxygen                        saturations were monitored continuously. The                        Colonoscope was introduced through the anus and                        advanced to the the cecum, identified by appendiceal                        orifice and ileocecal valve. The colonoscopy was                        performed without difficulty. The patient tolerated the                        procedure well. The quality of the bowel preparation                        was adequate to identify polyps. Findings:      Two sessile polyps were found in the sigmoid colon. The polyps were       diminutive in size. These polyps were removed with a jumbo cold forceps.       Resection and retrieval were complete.  A few medium-mouthed diverticula were found in the sigmoid colon and       descending colon.      Internal hemorrhoids were  found during endoscopy. The hemorrhoids were       small and Grade I (internal hemorrhoids that do not prolapse).      The exam was otherwise without abnormality. Impression:           - Two diminutive polyps in the sigmoid colon, removed                        with a jumbo cold forceps. Resected and retrieved.                       - Diverticulosis in the sigmoid colon and in the                        descending colon.                       - Internal hemorrhoids.                       - The examination was otherwise normal. Recommendation:       - Await pathology results. Scot Jun, MD 01/31/2018 10:42:48 AM This report has been signed electronically. Number of Addenda: 0 Note Initiated On: 01/31/2018 9:42 AM Scope Withdrawal Time: 0 hours 12 minutes 10 seconds  Total Procedure Duration: 0 hours 15 minutes 10 seconds       [facility]

## 2018-01-31 NOTE — Transfer of Care (Signed)
Immediate Anesthesia Transfer of Care Note  Patient: Todd [name]  Procedure(s) Performed: ESOPHAGOGASTRODUODENOSCOPY (EGD) WITH PROPOFOL (N/A ) COLONOSCOPY WITH PROPOFOL (N/A )  Patient Location: PACU  Anesthesia Type:General  Level of Consciousness: sedated  Airway & Oxygen Therapy: Patient Spontanous Breathing and Patient connected to nasal cannula oxygen  Post-op Assessment: Report given to RN and Post -op Vital signs reviewed and stable  Post vital signs: Reviewed and stable  Last Vitals:  Vitals Value Taken Time  BP 124/77 01/31/2018 10:42 AM  Temp 35.9 C 01/31/2018 10:42 AM  Pulse 100 01/31/2018 10:42 AM  Resp 13 01/31/2018 10:42 AM  SpO2 97 % 01/31/2018 10:42 AM  Vitals shown include unvalidated device data.  Last Pain:  Vitals:   01/31/18 1042  TempSrc: Tympanic  PainSc:          Complications: No apparent anesthesia complications

## 2018-01-31 NOTE — H&P (Signed)
Primary Care Physician:  Preston Fleetingevelo, Adrian Mancheno, MD Primary Gastroenterologist:  Dr. Mechele CollinElliott  Pre-Procedure History & Physical: HPI:  Todd [name] is a 52 y.o. male is here for an endoscopy and colonoscopy.  [facility].   Past Medical History:  Diagnosis Date  . Anxiety   . 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 Chronic left shoulder pain 02/15/2016  . Depression   . Diabetes mellitus without complication (HCC)   . Dislocation of right thumb 06/05/2016  . Gastroparesis 06/08/2017  . GERD (gastroesophageal reflux disease)   . HTN (hypertension) 05/19/2016  . Hypertension   . MDD (major depressive disorder), recurrent episode (HCC) 05/28/2016  . Neck pain, bilateral 02/15/2016  . Primary osteoarthritis of right knee 01/31/2016   and fingers  . PTSD (post-traumatic stress disorder) 06/05/2016  . Severe recurrent major depression without psychotic features (HCC) 05/19/2016  . Sleep apnea    unable to tolerate CPAP  . Substance induced mood disorder (HCC) 05/19/2016  . Suicidal ideation 05/19/2016  . Type 2 diabetes mellitus with complication, without long-term current use of insulin (HCC) 06/08/2017  . Uncontrolled type 2 diabetes mellitus with hyperglycemia, without long-term current use of insulin (HCC) 03/12/2017    Past Surgical History:  Procedure Laterality Date  . BACK SURGERY  11/209/18 and 10/08/17   x 2  . LEG AMPUTATION BELOW KNEE     left  . SHOULDER SURGERY Left     Prior to Admission medications   Medication Sig Start Date End Date Taking? Authorizing Provider  cloNIDine (CATAPRES) 0.2 MG tablet  04/26/17  Yes [provider]  ibuprofen (ADVIL,MOTRIN) 600 MG tablet Take 1 tablet (600 mg total) every 8 (eight) hours as needed by mouth. 08/26/17  Yes Rifenbark, Lloyd HugerNeil, MD  omeprazole (PRILOSEC) 10 MG capsule Take 10 mg by mouth daily.   Yes [provider]  B-D UF III MINI PEN NEEDLES 31G X 5 MM MISC  05/04/17    [provider]  desvenlafaxine (PRISTIQ) 50 MG 24 hr tablet Take 50 mg by mouth daily. 06/24/17   [provider]  doxepin (SINEQUAN) 50 MG capsule  04/26/17   [provider]  DULoxetine (CYMBALTA) 30 MG capsule Take 30 mg by mouth 2 (two) times daily. 06/15/17   [provider]  FARXIGA 10 MG TABS tablet 10 mg daily.  07/21/17   [provider]  gabapentin (NEURONTIN) 600 MG tablet TAKE 1 TABLET BY MOUTH FOUR TIMES A DAY FOR PAIN 05/04/17   [provider]  glipiZIDE (GLUCOTROL XL) 10 MG 24 hr tablet  07/12/17   [provider]  hydrochlorothiazide (HYDRODIURIL) 25 MG tablet take 1 tablet by mouth once daily for BLOOD PRESSURE 05/04/17   [provider]  IBU 800 MG tablet  04/29/17   [provider]  lisinopril (PRINIVIL,ZESTRIL) 40 MG tablet Take 1 tablet (40 mg total) by mouth daily. 05/20/16   Pucilowska, Braulio ConteJolanta B, MD  metoCLOPramide (REGLAN) 10 MG tablet Take 1 tablet (10 mg total) by mouth 4 (four) times daily -  before meals and at bedtime. Patient not taking: Reported on 01/31/2018 01/02/16   Sharman CheekStafford, Phillip, MD  [facility] DEL[facility] LANCETS FINE MISC USE TO TEST three times a day 05/29/17   [provider]  [facility] VER[facility] test strip  06/23/17   [provider]  sucralfate (CARAFATE) 1 g tablet Take 1 tablet (1 g total) by mouth 4 (four) times daily. Patient  not taking: Reported on 01/31/2018 01/02/16   Sharman Cheek, MD    Allergies as of 11/27/2017 - Review Complete 10/14/2017  Allergen Reaction Noted  . Benadryl [diphenhydramine] Anxiety and Other (See Comments) 01/11/2016  . Diphenhydramine hcl Anxiety and Other (See Comments) 01/11/2016    History reviewed. No pertinent family history.  Social History   Socioeconomic History  . Marital status: Married    Spouse name: Not on file  . Number of children: Not on file  . Years of education: Not on file  . Highest education level: Not on  file  Occupational History  . Not on file  Social Needs  . Financial resource strain: Not on file  . Food insecurity:    Worry: Not on file    Inability: Not on file  . Transportation needs:    Medical: Not on file    Non-medical: Not on file  Tobacco Use  . Smoking status: Never Smoker  . Smokeless tobacco: Never Used  Substance and Sexual Activity  . Alcohol use: No  . Drug use: No  . Sexual activity: Not on file  Lifestyle  . Physical activity:    Days per week: Not on file    Minutes per session: Not on file  . Stress: Not on file  Relationships  . Social connections:    Talks on phone: Not on file    Gets together: Not on file    Attends religious service: Not on file    Active member of club or organization: Not on file    Attends meetings of clubs or organizations: Not on file    Relationship status: Not on file  . Intimate partner violence:    Fear of current or ex partner: Not on file    Emotionally abused: Not on file    Physically abused: Not on file    Forced sexual activity: Not on file  Other Topics Concern  . Not on file  Social History Narrative  . Not on file    Review of Systems: See HPI, otherwise negative ROS  Physical Exam: BP (!) 146/90   Pulse 67   Temp (!) 96.7 F (35.9 C)   Resp 18   Ht 5\' 8"  (1.727 m)   Wt 121.1 kg (267 lb)   SpO2 99%   BMI 40.60 kg/m  General:   Alert,  pleasant and cooperative in NAD Head:  Normocephalic and atraumatic. Neck:  Supple; no masses or thyromegaly. Lungs:  Clear throughout to auscultation.    Heart:  Regular rate and rhythm. Abdomen:  Soft, nontender and nondistended. Normal bowel sounds, without guarding, and without rebound.   Neurologic:  Alert and  oriented x4;  grossly normal neurologically.  Impression/Plan: Todd [name] is here for an endoscopy and colonoscopy to be performed for [facility] colon polyps and upper abdominal pain.  Risks, benefits, limitations, and alternatives regarding   endoscopy and colonoscopy have been reviewed with the patient.  Questions have been answered.  All parties agreeable.   Lynnae Prude, MD  01/31/2018, 9:58 AM

## 2018-02-01 LAB — SURGICAL PATHOLOGY

## 2018-03-13 ENCOUNTER — Other Ambulatory Visit: Payer: Self-pay

## 2018-03-13 ENCOUNTER — Emergency Department
Admission: EM | Admit: 2018-03-13 | Discharge: 2018-03-13 | Disposition: A | Discharge status: Home / Self Care | Payer: Medicare Other | Attending: Emergency Medicine | Admitting: Emergency Medicine

## 2018-03-13 DIAGNOSIS — I10 Essential (primary) hypertension: Secondary | ICD-10-CM | POA: Diagnosis not present

## 2018-03-13 DIAGNOSIS — Z79899 Other long term (current) drug therapy: Secondary | ICD-10-CM | POA: Insufficient documentation

## 2018-03-13 DIAGNOSIS — Z7984 Long term (current) use of oral hypoglycemic drugs: Secondary | ICD-10-CM | POA: Diagnosis not present

## 2018-03-13 DIAGNOSIS — J029 Acute pharyngitis, unspecified: Secondary | ICD-10-CM | POA: Diagnosis not present

## 2018-03-13 DIAGNOSIS — E119 Type 2 diabetes mellitus without complications: Secondary | ICD-10-CM | POA: Insufficient documentation

## 2018-03-13 MED ORDER — AMOXICILLIN 500 MG PO TABS: 500.0000 mg | ORAL_TABLET | Freq: Three times a day (TID) | ORAL | 0 refills | Status: AC

## 2018-03-13 NOTE — ED Provider Notes (Signed)
[facility] Emergency Department Provider Note  ____________________________________________  Time seen: Approximately 11:46 AM  I have reviewed the triage vital signs and the nursing notes.   HISTORY  Chief Complaint Sore Throat    HPI Todd [name] is a 52 y.o. male who presents to the emergency department for treatment and evaluation of sore throat for the past week that has not resolved or improved with time or OTC medications. No known fever.  Past Medical History:  Diagnosis Date  . Anxiety   . 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 Chronic left shoulder pain 02/15/2016  . Depression   . Diabetes mellitus without complication (HCC)   . Dislocation of right thumb 06/05/2016  . Gastroparesis 06/08/2017  . GERD (gastroesophageal reflux disease)   . HTN (hypertension) 05/19/2016  . Hypertension   . MDD (major depressive disorder), recurrent episode (HCC) 05/28/2016  . Neck pain, bilateral 02/15/2016  . Primary osteoarthritis of right knee 01/31/2016   and fingers  . PTSD (post-traumatic stress disorder) 06/05/2016  . Severe recurrent major depression without psychotic features (HCC) 05/19/2016  . Sleep apnea    unable to tolerate CPAP  . Substance induced mood disorder (HCC) 05/19/2016  . Suicidal ideation 05/19/2016  . Type 2 diabetes mellitus with complication, without long-term current use of insulin (HCC) 06/08/2017  . Uncontrolled type 2 diabetes mellitus with hyperglycemia, without long-term current use of insulin (HCC) 03/12/2017    Patient Active Problem List   Diagnosis Date Noted  . 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 Gastroparesis 06/08/2017  . History of noncompliance with medical treatment 06/08/2017  . Type 2 diabetes mellitus with complication, without long-term current use of insulin (HCC) 06/08/2017  . Uncontrolled type 2 diabetes mellitus with hyperglycemia, without long-term current use of insulin (HCC) 03/12/2017  . Arthrosis of left  acromioclavicular joint 11/09/2016  . Dislocation of right thumb 06/05/2016  . PTSD (post-traumatic stress disorder) 06/05/2016  . MDD (major depressive disorder), recurrent episode (HCC) 05/28/2016  . Substance induced mood disorder (HCC) 05/19/2016  . Severe recurrent major depression without psychotic features (HCC) 05/19/2016  . Suicidal ideation 05/19/2016  . HTN (hypertension) 05/19/2016  . GERD (gastroesophageal reflux disease) 05/19/2016  . Alcohol use disorder, mild, abuse 05/19/2016  . Chronic left shoulder pain 02/15/2016  . Neck pain, bilateral 02/15/2016  . Primary osteoarthritis of right knee 01/31/2016    Past Surgical History:  Procedure Laterality Date  . BACK SURGERY  11/209/18 and 10/08/17   x 2  . COLONOSCOPY WITH PROPOFOL N/A 01/31/2018   Procedure: COLONOSCOPY WITH PROPOFOL;  Surgeon: Scot Jun, MD;  Location: [facility] ENDOSCOPY;  Service: Endoscopy;  Laterality: N/A;  . ESOPHAGOGASTRODUODENOSCOPY (EGD) WITH PROPOFOL N/A 01/31/2018   Procedure: ESOPHAGOGASTRODUODENOSCOPY (EGD) WITH PROPOFOL;  Surgeon: Scot Jun, MD;  [facility]COPY;  Service: Endoscopy;  Laterality: N/A;  . LEG AMPUTATION BELOW KNEE     left  . SHOULDER SURGERY Left     Prior to Admission medications   Medication Sig Start Date End Date Taking? Authorizing Provider  amoxicillin (AMOXIL) 500 MG tablet Take 1 tablet (500 mg total) by mouth 3 (three) times daily for 7 days. 03/13/18 03/20/18  Chinita Pester, FNP  B-D UF III MINI PEN NEEDLES 31G X 5 MM MISC  05/04/17   [provider]  cloNIDine (CATAPRES) 0.2 MG tablet  04/26/17   [provider]  desvenlafaxine (PRISTIQ) 50 MG 24 hr tablet Take 50 mg by mouth daily. 06/24/17  [provider]  doxepin (SINEQUAN) 50 MG capsule  04/26/17   [provider]  DULoxetine (CYMBALTA) 30 MG capsule Take 30 mg by mouth 2 (two) times daily. 06/15/17   [provider]  FARXIGA 10 MG TABS tablet 10 mg  daily.  07/21/17   [provider]  gabapentin (NEURONTIN) 600 MG tablet TAKE 1 TABLET BY MOUTH FOUR TIMES A DAY FOR PAIN 05/04/17   [provider]  glipiZIDE (GLUCOTROL XL) 10 MG 24 hr tablet  07/12/17   [provider]  hydrochlorothiazide (HYDRODIURIL) 25 MG tablet take 1 tablet by mouth once daily for BLOOD PRESSURE 05/04/17   [provider]  IBU 800 MG tablet  04/29/17   [provider]  ibuprofen (ADVIL,MOTRIN) 600 MG tablet Take 1 tablet (600 mg total) every 8 (eight) hours as needed by mouth. 08/26/17   Merrily Brittleifenbark, Neil, MD  lisinopril (PRINIVIL,ZESTRIL) 40 MG tablet Take 1 tablet (40 mg total) by mouth daily. 05/20/16   Pucilowska, Braulio ConteJolanta B, MD  metoCLOPramide (REGLAN) 10 MG tablet Take 1 tablet (10 mg total) by mouth 4 (four) times daily -  before meals and at bedtime. Patient not taking: Reported on 01/31/2018 01/02/16   Sharman CheekStafford, Phillip, MD  omeprazole (PRILOSEC) 10 MG capsule Take 10 mg by mouth daily.    [provider]  [facility] ArgyleNETOUCH DELICA LANCETS FINE MISC USE TO TEST three times a day 05/29/17   [provider]  [facility] VER[facility] test strip  06/23/17   [provider]  sucralfate (CARAFATE) 1 g tablet Take 1 tablet (1 g total) by mouth 4 (four) times daily. Patient not taking: Reported on 01/31/2018 01/02/16   Sharman CheekStafford, Phillip, MD    Allergies Benadryl [diphenhydramine] and Diphenhydramine hcl  History reviewed. No pertinent family history.  Social History Social History   Tobacco Use  . Smoking status: Never Smoker  . Smokeless tobacco: Never Used  Substance Use Topics  . Alcohol use: No  . Drug use: No    Review of Systems Constitutional: Negative for fever. Eyes: No visual changes. ENT: Positive for sore throat; negative for difficulty swallowing. Respiratory: Denies shortness of breath. Gastrointestinal: No abdominal pain.  No nausea, no vomiting.  No diarrhea.  Genitourinary: Negative for  dysuria. Musculoskeletal: Negative for generalized body aches. Skin: Negative for rash. Neurological: Negative for headaches, negative  focal weakness or numbness.  ____________________________________________   PHYSICAL EXAM:  VITAL SIGNS: ED Triage Vitals  Enc Vitals Group     BP 03/13/18 1036 (!) 144/72     Pulse Rate 03/13/18 1036 74     Resp 03/13/18 1036 14     Temp 03/13/18 1036 98.8 F (37.1 C)     Temp Source 03/13/18 1036 Oral     SpO2 03/13/18 1036 94 %     Weight 03/13/18 1044 260 lb (117.9 kg)     Height 03/13/18 1044 5\' 8"  (1.727 m)     Head Circumference --      Peak Flow --      Pain Score 03/13/18 1044 8     Pain Loc --      Pain Edu? --      Excl. in GC? --    Constitutional: Alert and oriented. Well appearing and in no acute distress. Eyes: Conjunctivae are normal.  Head: Atraumatic. Nose: No congestion/rhinnorhea. Mouth/Throat: Mucous membranes are moist.  Oropharynx erythematous, tonsils 2+ with exudate. Uvula is midline. Neck: No stridor.  Lymphatic: Lymphadenopathy: Bilateral anterior cervical lymphadenopathy. Cardiovascular:  Normal rate, regular rhythm. Good peripheral circulation. Respiratory: Normal respiratory effort. Lungs CTAB. Gastrointestinal: Soft and nontender. Musculoskeletal: No lower extremity tenderness nor edema.  Neurologic:  Normal speech and language. No gross focal neurologic deficits are appreciated. Speech is normal. No gait instability. Skin:  Skin is warm, dry and intact. No rash noted Psychiatric: Mood and affect are normal. Speech and behavior are normal.  ____________________________________________   LABS (all labs ordered are listed, but only abnormal results are displayed)  Labs Reviewed - No data to display ____________________________________________  EKG  Not indicated. ____________________________________________  RADIOLOGY  Not  indicated. ____________________________________________   PROCEDURES  Procedure(s) performed: None  Critical Care performed: No ____________________________________________   INITIAL IMPRESSION / ASSESSMENT AND PLAN / ED COURSE  52 year old male presenting to the ER for evaluation and treatment of sore throat. Symptoms and exam are concerning for strep pharyngitis. He will be treated with amoxicillin and advised to continue the tylenol or ibuprofen for pain or fever. He is to follow up with primary care provider for symptoms that are not improving over the next few days.  Pertinent labs & imaging results that were available during my care of the patient were reviewed by me and considered in my medical decision making (see chart for details). ____________________________________________  Discharge Medication List as of 03/13/2018 11:25 AM    START taking these medications   Details  amoxicillin (AMOXIL) 500 MG tablet Take 1 tablet (500 mg total) by mouth 3 (three) times daily for 7 days., Starting Sun 03/13/2018, Until Sun 03/20/2018, Print        FINAL CLINICAL IMPRESSION(S) / ED DIAGNOSES  Final diagnoses:  Exudative pharyngitis    If controlled substance prescribed during this visit, 12 month history viewed on the [facility] prior to issuing an initial prescription for Schedule II or III opiod.   Note:  This document was prepared using Dragon voice recognition software and may include unintentional dictation errors.    Chinita Pester, FNP 03/13/18 1455    Jeanmarie Plant, MD 03/13/18 1609

## 2018-03-13 NOTE — ED Triage Notes (Signed)
Pt c/o sore throat for the past week. Denies fever

## 2018-05-06 ENCOUNTER — Emergency Department
Admission: EM | Admit: 2018-05-06 | Discharge: 2018-05-06 | Disposition: A | Discharge status: Home / Self Care | Payer: Medicare Other | Attending: Emergency Medicine | Admitting: Emergency Medicine

## 2018-05-06 ENCOUNTER — Other Ambulatory Visit: Payer: Self-pay

## 2018-05-06 ENCOUNTER — Emergency Department: Payer: Medicare Other

## 2018-05-06 ENCOUNTER — Encounter: Payer: Self-pay | Admitting: Emergency Medicine

## 2018-05-06 DIAGNOSIS — Z7984 Long term (current) use of oral hypoglycemic drugs: Secondary | ICD-10-CM | POA: Insufficient documentation

## 2018-05-06 DIAGNOSIS — E119 Type 2 diabetes mellitus without complications: Secondary | ICD-10-CM | POA: Diagnosis not present

## 2018-05-06 DIAGNOSIS — I10 Essential (primary) hypertension: Secondary | ICD-10-CM | POA: Diagnosis not present

## 2018-05-06 DIAGNOSIS — Z79899 Other long term (current) drug therapy: Secondary | ICD-10-CM | POA: Insufficient documentation

## 2018-05-06 DIAGNOSIS — Z89512 Acquired absence of left leg below knee: Secondary | ICD-10-CM | POA: Insufficient documentation

## 2018-05-06 DIAGNOSIS — R079 Chest pain, unspecified: Secondary | ICD-10-CM | POA: Diagnosis not present

## 2018-05-06 LAB — TROPONIN I

## 2018-05-06 LAB — BASIC METABOLIC PANEL
ANION GAP: 8 (ref 5–15)
BUN: 19 mg/dL (ref 6–20)
CALCIUM: 9.3 mg/dL (ref 8.9–10.3)
CO2: 29 mmol/L (ref 22–32)
Chloride: 104 mmol/L (ref 98–111)
Creatinine, Ser: 1.11 mg/dL (ref 0.61–1.24)
Glucose, Bld: 140 mg/dL — ABNORMAL HIGH (ref 70–99)
POTASSIUM: 3.5 mmol/L (ref 3.5–5.1)
Sodium: 141 mmol/L (ref 135–145)

## 2018-05-06 LAB — CBC
HCT: 47.5 % (ref 40.0–52.0)
HEMOGLOBIN: 16.7 g/dL (ref 13.0–18.0)
MCH: 29.1 pg (ref 26.0–34.0)
MCHC: 35.2 g/dL (ref 32.0–36.0)
MCV: 82.7 fL (ref 80.0–100.0)
Platelets: 201 10*3/uL (ref 150–440)
RBC: 5.74 MIL/uL (ref 4.40–5.90)
RDW: 14.5 % (ref 11.5–14.5)
WBC: 7.9 10*3/uL (ref 3.8–10.6)

## 2018-05-06 MED ORDER — FAMOTIDINE 20 MG PO TABS
20.0000 mg | ORAL_TABLET | Freq: Once | ORAL | Status: AC
  Administered 2018-05-06: 20 mg via ORAL
  Filled 2018-05-06: qty 1

## 2018-05-06 NOTE — ED Triage Notes (Signed)
Pt arrived with concerns of mid sternum chest pain that started with morning.Pt had nausea/vomitting with the pain that caused him to throw up morning medication. Pt states he does have a hx of anxiety and finds it hard to differentiate the difference between pain and a panic attack.

## 2018-05-06 NOTE — ED Provider Notes (Signed)
[facility] Emergency Department Provider Note  ____________________________________________  Time seen: Approximately 1:47 PM  I have reviewed the triage vital signs and the nursing notes.   HISTORY  Chief Complaint Chest Pain   HPI Todd [name] is a 52 y.o. male with a history of anxiety, diabetes, GERD, hypertension, hyperlipidemia who presents for evaluation of chest pain.  Patient reports that he woke up this morning and took all his morning meds on an empty stomach.  He became very nauseous and had an episode of nonbloody nonbilious emesis.  Right after that he started having burning sensation in his epigastric region which then radiated up to his chest. the discomfort in his chest lasted for several hours then he has now resolved.  He continues to complain of mild burning sensation in the epigastric region which has been constant since 7:30 AM.  Patient reports that he suffers from anxiety and when his chest started hurting he started becoming very anxious.  He denies diaphoresis, shortness of breath associated with this episode of chest pain.  He denies any prior history of chest pain.  He denies any prior history of smoking.  His mother has had heart attacks in the past.  Patient does report having severe reflux and takes Prilosec for it at home.  Past Medical History:  Diagnosis Date  . Anxiety   . 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 Chronic left shoulder pain 02/15/2016  . Depression   . Diabetes mellitus without complication (HCC)   . Dislocation of right thumb 06/05/2016  . Gastroparesis 06/08/2017  . GERD (gastroesophageal reflux disease)   . HTN (hypertension) 05/19/2016  . Hypertension   . MDD (major depressive disorder), recurrent episode (HCC) 05/28/2016  . Neck pain, bilateral 02/15/2016  . Primary osteoarthritis of right knee 01/31/2016   and fingers  . PTSD (post-traumatic stress disorder) 06/05/2016  . Severe recurrent major  depression without psychotic features (HCC) 05/19/2016  . Sleep apnea    unable to tolerate CPAP  . Substance induced mood disorder (HCC) 05/19/2016  . Suicidal ideation 05/19/2016  . Type 2 diabetes mellitus with complication, without long-term current use of insulin (HCC) 06/08/2017  . Uncontrolled type 2 diabetes mellitus with hyperglycemia, without long-term current use of insulin (HCC) 03/12/2017    Patient Active Problem List   Diagnosis Date Noted  . 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 Gastroparesis 06/08/2017  . History of noncompliance with medical treatment 06/08/2017  . Type 2 diabetes mellitus with complication, without long-term current use of insulin (HCC) 06/08/2017  . Uncontrolled type 2 diabetes mellitus with hyperglycemia, without long-term current use of insulin (HCC) 03/12/2017  . 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 Dislocation of right thumb 06/05/2016  . PTSD (post-traumatic stress disorder) 06/05/2016  . MDD (major depressive disorder), recurrent episode (HCC) 05/28/2016  . Substance induced mood disorder (HCC) 05/19/2016  . Severe recurrent major depression without psychotic features (HCC) 05/19/2016  . Suicidal ideation 05/19/2016  . HTN (hypertension) 05/19/2016  . GERD (gastroesophageal reflux disease) 05/19/2016  . Alcohol use disorder, mild, abuse 05/19/2016  . Chronic left shoulder pain 02/15/2016  . Neck pain, bilateral 02/15/2016  . Primary osteoarthritis of right knee 01/31/2016    Past Surgical History:  Procedure Laterality Date  . BACK SURGERY  11/209/18 and 10/08/17   x 2  . COLONOSCOPY WITH PROPOFOL N/A 01/31/2018   Procedure: COLONOSCOPY WITH PROPOFOL;  Surgeon: Scot Jun, MD;  Location: [facility] ENDOSCOPY;  Service: Endoscopy;  Laterality: N/A;  . ESOPHAGOGASTRODUODENOSCOPY (EGD) WITH PROPOFOL N/A 01/31/2018   Procedure: ESOPHAGOGASTRODUODENOSCOPY (EGD) WITH PROPOFOL;  Surgeon: Scot Jun, MD;  Location: [facility] ENDOSCOPY;  Service:  Endoscopy;  Laterality: N/A;  . LEG AMPUTATION BELOW KNEE     left  . SHOULDER SURGERY Left     Prior to Admission medications   Medication Sig Start Date End Date Taking? Authorizing Provider  B-D UF III MINI PEN NEEDLES 31G X 5 MM MISC  05/04/17   [provider]  cloNIDine (CATAPRES) 0.2 MG tablet  04/26/17   [provider]  desvenlafaxine (PRISTIQ) 50 MG 24 hr tablet Take 50 mg by mouth daily. 06/24/17   [provider]  doxepin (SINEQUAN) 50 MG capsule  04/26/17   [provider]  DULoxetine (CYMBALTA) 30 MG capsule Take 30 mg by mouth 2 (two) times daily. 06/15/17   [provider]  FARXIGA 10 MG TABS tablet 10 mg daily.  07/21/17   [provider]  gabapentin (NEURONTIN) 600 MG tablet TAKE 1 TABLET BY MOUTH FOUR TIMES A DAY FOR PAIN 05/04/17   [provider]  glipiZIDE (GLUCOTROL XL) 10 MG 24 hr tablet  07/12/17   [provider]  hydrochlorothiazide (HYDRODIURIL) 25 MG tablet take 1 tablet by mouth once daily for BLOOD PRESSURE 05/04/17   [provider]  IBU 800 MG tablet  04/29/17   [provider]  ibuprofen (ADVIL,MOTRIN) 600 MG tablet Take 1 tablet (600 mg total) every 8 (eight) hours as needed by mouth. 08/26/17   Merrily Brittle, MD  lisinopril (PRINIVIL,ZESTRIL) 40 MG tablet Take 1 tablet (40 mg total) by mouth daily. 05/20/16   Pucilowska, Braulio Conte B, MD  metoCLOPramide (REGLAN) 10 MG tablet Take 1 tablet (10 mg total) by mouth 4 (four) times daily -  before meals and at bedtime. Patient not taking: Reported on 01/31/2018 01/02/16   Sharman Cheek, MD  omeprazole (PRILOSEC) 10 MG capsule Take 10 mg by mouth daily.    [provider]  Dola Argyle LANCETS FINE MISC USE TO TEST three times a day 05/29/17   [provider]  [facility] VERIO test strip  06/23/17   [provider]  sucralfate (CARAFATE) 1 g tablet Take 1 tablet (1 g total) by mouth 4 (four) times  daily. Patient not taking: Reported on 01/31/2018 01/02/16   Sharman Cheek, MD    Allergies Benadryl [diphenhydramine] and Diphenhydramine hcl  No family history on file.  Social History Social History   Tobacco Use  . Smoking status: Never Smoker  . Smokeless tobacco: Never Used  Substance Use Topics  . Alcohol use: No  . Drug use: No    Review of Systems  Constitutional: Negative for fever. Eyes: Negative for visual changes. ENT: Negative for sore throat. Neck: No neck pain  Cardiovascular: + chest pain. Respiratory: Negative for shortness of breath. Gastrointestinal: + epigastric abdominal pain, nausea, and vomiting. No diarrhea. Genitourinary: Negative for dysuria. Musculoskeletal: Negative for back pain. Skin: Negative for rash. Neurological: Negative for headaches, weakness or numbness. Psych: No SI or HI  ____________________________________________   PHYSICAL EXAM:  VITAL SIGNS: ED Triage Vitals  Enc Vitals Group     BP 05/06/18 1012 (!) 178/97     Pulse Rate 05/06/18 1012 76     Resp 05/06/18 1012 20     Temp 05/06/18 1012 98 F (36.7 C)     Temp Source 05/06/18 1012 Oral     SpO2 05/06/18 1012 98 %  Weight 05/06/18 1010 256 lb (116.1 kg)     Height 05/06/18 1010 5\' 8"  (1.727 m)     Head Circumference --      Peak Flow --      Pain Score 05/06/18 1010 7     Pain Loc --      Pain Edu? --      Excl. in GC? --     Constitutional: Alert and oriented. Well appearing and in no apparent distress. HEENT:      Head: Normocephalic and atraumatic.         Eyes: Conjunctivae are normal. Sclera is non-icteric.       Mouth/Throat: Mucous membranes are moist.       Neck: Supple with no signs of meningismus. Cardiovascular: Regular rate and rhythm. No murmurs, gallops, or rubs. 2+ symmetrical distal pulses are present in all extremities. No JVD. Respiratory: Normal respiratory effort. Lungs are clear to auscultation bilaterally. No wheezes, crackles,  or rhonchi.  Gastrointestinal: Soft, non tender, and non distended with positive bowel sounds. No rebound or guarding. Musculoskeletal:  No edema, cyanosis, or erythema of RLE, patient has a prosthesis of the LLE Neurologic: Normal speech and language. Face is symmetric. Moving all extremities. No gross focal neurologic deficits are appreciated. Skin: Skin is warm, dry and intact. No rash noted. Psychiatric: Mood and affect are normal. Speech and behavior are normal.  ____________________________________________   LABS (all labs ordered are listed, but only abnormal results are displayed)  Labs Reviewed  BASIC METABOLIC PANEL - Abnormal; Notable for the following components:      Result Value   Glucose, Bld 140 (*)    All other components within normal limits  CBC  TROPONIN I  TROPONIN I   ____________________________________________  EKG  ED ECG REPORT I, Nita Sicklearolina Slater Mcmanaman, the attending physician, personally viewed and interpreted this ECG.  Sinus rhythm, rate of 78, normal intervals, normal axis, no ST elevations or depressions.  Normal EKG. ____________________________________________  RADIOLOGY  I have personally reviewed the images performed during this visit and I agree with the Radiologist's read.   Interpretation by Radiologist:  Dg Chest 2 View  Result Date: 05/06/2018 CLINICAL DATA:  Pt arrived with concerns of mid sternum chest pain that started with morning.Pt had nausea/vomitting with the pain that caused him to throw up morning medication. Pt states he does have a hx of anxiety and finds it hard to differentiate the difference between pain and a panic attack. Hx GERD, hypertension EXAM: CHEST - 2 VIEW COMPARISON:  05/28/2017 FINDINGS: The heart size and mediastinal contours are within normal limits. Both lungs are clear. No pleural effusion or pneumothorax. The visualized skeletal structures are unremarkable. IMPRESSION: No active cardiopulmonary disease.  Electronically Signed   By: Amie Portlandavid  Ormond M.D.   On: 05/06/2018 10:33     ____________________________________________   PROCEDURES  Procedure(s) performed: None Procedures Critical Care performed:  None ____________________________________________   INITIAL IMPRESSION / ASSESSMENT AND PLAN / ED COURSE  52 y.o. male with a history of anxiety, diabetes, GERD, hypertension, hyperlipidemia who presents for evaluation of burning chest and epigastric pain after vomiting his am pills.  low suspicion for cardiac (HEART score 3) or other serious etiology (including aortic dissection, pneumonia, pneumothorax, or pulmonary embolism) based his history and physical exam in the ED today. EKG normal. Plan for labs including CBC, chemistries and troponin now and in 3 hours, CXR and re-evaluation for disposition. Will give full dose pepcid for GERD. Will observe patient  on cardiac monitor while in the ED.    _________________________ 2:49 PM on 05/06/2018 -----------------------------------------  She remains with no further episodes of chest pain.  Second troponin is negative.  Patient's burning epigastric discomfort went away with p.o. Pepcid.  At this time will discharge patient home with follow-up with cardiology due to patient's family history of heart attacks.  Discussed return precautions for new or worsening chest pain, shortness of breath, dizziness.   As part of my medical decision making, I reviewed the following data within the electronic MEDICAL RECORD NUMBER Nursing notes reviewed and incorporated, Labs reviewed , EKG interpreted , Old EKG reviewed, Old chart reviewed, Radiograph reviewed , Notes from prior ED visits and [place] Controlled Substance Database    Pertinent labs & imaging results that were available during my care of the patient were reviewed by me and considered in my medical decision making (see chart for details).    ____________________________________________   FINAL  CLINICAL IMPRESSION(S) / ED DIAGNOSES  Final diagnoses:  Chest pain, unspecified type      NEW MEDICATIONS STARTED DURING THIS VISIT:  ED Discharge Orders    None       Note:  This document was prepared using Dragon voice recognition software and may include unintentional dictation errors.    Don Perking, [place], MD 05/06/18 [phone]

## 2018-05-06 NOTE — ED Notes (Addendum)
Pt states this am after taking his morning meds he started to feel nauseated, dizzy, abd discomfort with burning, and headache, pt reports that he vomited twice, pt states that his chest feels better at this time but states that he cont to have the burning sensation in his abd as well as headache, no distress noted at this time, pt also states that he has ptsd and that he often has had cp and is always uncertain if it is related to panic or his heart. pt reports unable to have peace of mind without eval. wife at bedside

## 2018-05-06 NOTE — Discharge Instructions (Addendum)

## 2018-05-29 ENCOUNTER — Emergency Department
Admission: EM | Admit: 2018-05-29 | Discharge: 2018-05-29 | Disposition: A | Discharge status: Home / Self Care | Payer: Medicare Other | Attending: Emergency Medicine | Admitting: Emergency Medicine

## 2018-05-29 ENCOUNTER — Encounter: Payer: Self-pay | Admitting: *Deleted

## 2018-05-29 ENCOUNTER — Other Ambulatory Visit: Payer: Self-pay

## 2018-05-29 ENCOUNTER — Emergency Department: Payer: Medicare Other

## 2018-05-29 DIAGNOSIS — Z79899 Other long term (current) drug therapy: Secondary | ICD-10-CM | POA: Insufficient documentation

## 2018-05-29 DIAGNOSIS — S52501A Unspecified fracture of the lower end of right radius, initial encounter for closed fracture: Secondary | ICD-10-CM | POA: Insufficient documentation

## 2018-05-29 DIAGNOSIS — Y929 Unspecified place or not applicable: Secondary | ICD-10-CM | POA: Diagnosis not present

## 2018-05-29 DIAGNOSIS — Y9389 Activity, other specified: Secondary | ICD-10-CM | POA: Insufficient documentation

## 2018-05-29 DIAGNOSIS — E119 Type 2 diabetes mellitus without complications: Secondary | ICD-10-CM | POA: Insufficient documentation

## 2018-05-29 DIAGNOSIS — W010XXA Fall on same level from slipping, tripping and stumbling without subsequent striking against object, initial encounter: Secondary | ICD-10-CM | POA: Diagnosis not present

## 2018-05-29 DIAGNOSIS — I10 Essential (primary) hypertension: Secondary | ICD-10-CM | POA: Diagnosis not present

## 2018-05-29 DIAGNOSIS — Y998 Other external cause status: Secondary | ICD-10-CM | POA: Insufficient documentation

## 2018-05-29 DIAGNOSIS — S59911A Unspecified injury of right forearm, initial encounter: Secondary | ICD-10-CM | POA: Diagnosis present

## 2018-05-29 DIAGNOSIS — Z7984 Long term (current) use of oral hypoglycemic drugs: Secondary | ICD-10-CM | POA: Insufficient documentation

## 2018-05-29 MED ORDER — KETOROLAC TROMETHAMINE 30 MG/ML IJ SOLN
30.0000 mg | Freq: Once | INTRAMUSCULAR | Status: AC
  Administered 2018-05-29: 30 mg via INTRAMUSCULAR
  Filled 2018-05-29: qty 1

## 2018-05-29 MED ORDER — OXYCODONE-ACETAMINOPHEN 5-325 MG PO TABS
1.0000 | ORAL_TABLET | Freq: Once | ORAL | Status: AC
  Administered 2018-05-29: 1 via ORAL
  Filled 2018-05-29: qty 1

## 2018-05-29 MED ORDER — OXYCODONE-ACETAMINOPHEN 5-325 MG PO TABS: 1.0000 | ORAL_TABLET | Freq: Three times a day (TID) | ORAL | 0 refills | Status: AC | PRN

## 2018-05-29 MED ORDER — MELOXICAM 15 MG PO TABS: 15.0000 mg | ORAL_TABLET | Freq: Every day | ORAL | 1 refills | Status: AC

## 2018-05-29 NOTE — ED Triage Notes (Signed)
Patient c/o right wrist pain after fall today.

## 2018-05-29 NOTE — ED Provider Notes (Signed)
[facility] Emergency Department Provider Note  ____________________________________________  Time seen: Approximately 4:01 PM  I have reviewed the triage vital signs and the nursing notes.   HISTORY  Chief Complaint Fall    HPI Todd [name] is a 52 y.o. male presents to the emergency department with right wrist and right forearm pain.  Patient fell today while working on a vehicle.  Patient reports that he instantly heard a crunch.  He has had some numbness in his fingers since incident occurred but reports that he feels exquisite tenderness when he moves his fingers so he has been trying to avoid any form of movement.  No skin compromise occurred. No alleviating measures have been attempted.    Past Medical History:  Diagnosis Date  . Anxiety   . 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 Chronic left shoulder pain 02/15/2016  . Depression   . Diabetes mellitus without complication (HCC)   . Dislocation of right thumb 06/05/2016  . Gastroparesis 06/08/2017  . GERD (gastroesophageal reflux disease)   . HTN (hypertension) 05/19/2016  . Hypertension   . MDD (major depressive disorder), recurrent episode (HCC) 05/28/2016  . Neck pain, bilateral 02/15/2016  . Primary osteoarthritis of right knee 01/31/2016   and fingers  . PTSD (post-traumatic stress disorder) 06/05/2016  . Severe recurrent major depression without psychotic features (HCC) 05/19/2016  . Sleep apnea    unable to tolerate CPAP  . Substance induced mood disorder (HCC) 05/19/2016  . Suicidal ideation 05/19/2016  . Type 2 diabetes mellitus with complication, without long-term current use of insulin (HCC) 06/08/2017  . Uncontrolled type 2 diabetes mellitus with hyperglycemia, without long-term current use of insulin (HCC) 03/12/2017    Patient Active Problem List   Diagnosis Date Noted  . 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 Gastroparesis 06/08/2017  . History of noncompliance with medical treatment 06/08/2017   . Type 2 diabetes mellitus with complication, without long-term current use of insulin (HCC) 06/08/2017  . Uncontrolled type 2 diabetes mellitus with hyperglycemia, without long-term current use of insulin (HCC) 03/12/2017  . 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 Dislocation of right thumb 06/05/2016  . PTSD (post-traumatic stress disorder) 06/05/2016  . MDD (major depressive disorder), recurrent episode (HCC) 05/28/2016  . Substance induced mood disorder (HCC) 05/19/2016  . Severe recurrent major depression without psychotic features (HCC) 05/19/2016  . Suicidal ideation 05/19/2016  . HTN (hypertension) 05/19/2016  . GERD (gastroesophageal reflux disease) 05/19/2016  . Alcohol use disorder, mild, abuse 05/19/2016  . Chronic left shoulder pain 02/15/2016  . Neck pain, bilateral 02/15/2016  . Primary osteoarthritis of right knee 01/31/2016    Past Surgical History:  Procedure Laterality Date  . BACK SURGERY  11/209/18 and 10/08/17   x 2  . COLONOSCOPY WITH PROPOFOL N/A 01/31/2018   Procedure: COLONOSCOPY WITH PROPOFOL;  Surgeon: Scot Jun, MD;  Location: [facility] ENDOSCOPY;  Service: Endoscopy;  Laterality: N/A;  . ESOPHAGOGASTRODUODENOSCOPY (EGD) WITH PROPOFOL N/A 01/31/2018   Procedure: ESOPHAGOGASTRODUODENOSCOPY (EGD) WITH PROPOFOL;  Surgeon: Scot Jun, MD;  Location: [facility] ENDOSCOPY;  Service: Endoscopy;  Laterality: N/A;  . LEG AMPUTATION BELOW KNEE     left  . SHOULDER SURGERY Left     Prior to Admission medications   Medication Sig Start Date End Date Taking? Authorizing Provider  B-D UF III MINI PEN NEEDLES 31G X 5 MM MISC  05/04/17   [provider]  cloNIDine (CATAPRES) 0.2 MG tablet  04/26/17   [provider]  desvenlafaxine (PRISTIQ) 50 MG 24 hr tablet Take 50 mg by mouth daily. 06/24/17   [provider]  doxepin (SINEQUAN) 50 MG capsule  04/26/17   [provider]  DULoxetine (CYMBALTA) 30 MG capsule Take  30 mg by mouth 2 (two) times daily. 06/15/17   [provider]  FARXIGA 10 MG TABS tablet 10 mg daily.  07/21/17   [provider]  gabapentin (NEURONTIN) 600 MG tablet TAKE 1 TABLET BY MOUTH FOUR TIMES A DAY FOR PAIN 05/04/17   [provider]  glipiZIDE (GLUCOTROL XL) 10 MG 24 hr tablet  07/12/17   [provider]  hydrochlorothiazide (HYDRODIURIL) 25 MG tablet take 1 tablet by mouth once daily for BLOOD PRESSURE 05/04/17   [provider]  IBU 800 MG tablet  04/29/17   [provider]  ibuprofen (ADVIL,MOTRIN) 600 MG tablet Take 1 tablet (600 mg total) every 8 (eight) hours as needed by mouth. 08/26/17   Merrily Brittle, MD  lisinopril (PRINIVIL,ZESTRIL) 40 MG tablet Take 1 tablet (40 mg total) by mouth daily. 05/20/16   Pucilowska, Braulio Conte B, MD  meloxicam (MOBIC) 15 MG tablet Take 1 tablet (15 mg total) by mouth daily for 7 days. 05/29/18 06/05/18  Orvil Feil, PA-C  metoCLOPramide (REGLAN) 10 MG tablet Take 1 tablet (10 mg total) by mouth 4 (four) times daily -  before meals and at bedtime. Patient not taking: Reported on 01/31/2018 01/02/16   Sharman Cheek, MD  omeprazole (PRILOSEC) 10 MG capsule Take 10 mg by mouth daily.    [provider]  Dola Argyle LANCETS FINE MISC USE TO TEST three times a day 05/29/17   [provider]  [facility] VERIO test strip  06/23/17   [provider]  oxyCODONE-acetaminophen (PERCOCET/ROXICET) 5-325 MG tablet Take 1 tablet by mouth every 8 (eight) hours as needed for up to 5 days for severe pain. 05/29/18 06/03/18  Orvil Feil, PA-C  sucralfate (CARAFATE) 1 g tablet Take 1 tablet (1 g total) by mouth 4 (four) times daily. Patient not taking: Reported on 01/31/2018 01/02/16   Sharman Cheek, MD    Allergies Benadryl [diphenhydramine] and Diphenhydramine hcl  No family history on file.  Social History Social History   Tobacco Use  . Smoking status: Never Smoker  .  Smokeless tobacco: Never Used  Substance Use Topics  . Alcohol use: No  . Drug use: No     Review of Systems  Constitutional: No fever/chills Eyes: No visual changes. No discharge ENT: No upper respiratory complaints. Cardiovascular: no chest pain. Respiratory: no cough. No SOB. Gastrointestinal: No abdominal pain.  No nausea, no vomiting.  No diarrhea.  No constipation. Musculoskeletal: Patient has right wrist pain. Skin: Negative for rash, abrasions, lacerations, ecchymosis. Neurological: Negative for headaches, focal weakness or numbness.  ____________________________________________   PHYSICAL EXAM:  VITAL SIGNS: ED Triage Vitals  Enc Vitals Group     BP 05/29/18 1354 (!) 162/92     Pulse Rate 05/29/18 1354 80     Resp --      Temp 05/29/18 1354 98.1 F (36.7 C)     Temp Source 05/29/18 1354 Oral     SpO2 05/29/18 1354 92 %     Weight --      Height --      Head Circumference --      Peak Flow --      Pain Score 05/29/18 1406 10     Pain Loc --  Pain Edu? --      Excl. in GC? --      Constitutional: Alert and oriented. Well appearing and in no acute distress. Eyes: Conjunctivae are normal. PERRL. EOMI. Head: Atraumatic. Cardiovascular: Normal rate, regular rhythm. Normal S1 and S2.  Good peripheral circulation. Respiratory: Normal respiratory effort without tachypnea or retractions. Lungs CTAB. Good air entry to the bases with no decreased or absent breath sounds. Musculoskeletal: Patient is unable to perform full range of motion at the right wrist, likely secondary to pain.  He is able to move all 5 right fingers.  Right wrist and right hand are warm to the touch and compartments are soft.  Palpable radial pulse, right. Neurologic:  Normal speech and language. No gross focal neurologic deficits are appreciated.  Skin:  Skin is warm, dry and intact. No rash noted. Psychiatric: Mood and affect are normal. Speech and behavior are normal. Patient exhibits  appropriate insight and judgement.   ____________________________________________   LABS (all labs ordered are listed, but only abnormal results are displayed)  Labs Reviewed - No data to display ____________________________________________  EKG   ____________________________________________  RADIOLOGY I personally viewed and evaluated these images as part of my medical decision making, as well as reviewing the written report by the radiologist  Dg Forearm Right  Result Date: 05/29/2018 CLINICAL DATA:  Pain following fall EXAM: RIGHT FOREARM - 2 VIEW COMPARISON:  Right wrist radiographs May 29, 2018 FINDINGS: Frontal and lateral views were obtained. There is a fracture, obliquely oriented, at the base of the radial styloid with fracture fragments near anatomic. A fracture fragment extending into the radiocarpal joint is better seen on wrist images. No other fractures are evident. No dislocation. Joint spaces appear normal. No elbow joint effusion. There is edema in the pronator quadratus fat pad region. IMPRESSION: Comminuted fracture at the base of the radial styloid with alignment near anatomic. No other fracture. No dislocation. No appreciable arthropathic change. Electronically Signed   By: Bretta BangWilliam  Woodruff III M.D.   On: 05/29/2018 15:40   Dg Wrist Complete Right  Result Date: 05/29/2018 CLINICAL DATA:  Pain following fall EXAM: RIGHT WRIST - COMPLETE 3+ VIEW COMPARISON:  Right hand radiographs May 18, 2016 FINDINGS: Frontal, oblique, lateral, and ulnar deviation scaphoid images were obtained. There is a comminuted obliquely oriented fracture involving the distal radius at the level of the base of the radial styloid. Fracture extends into the radiocarpal joint with a loose fragment in the radiocarpal joint at the scapholunate level. Major fracture fragments are overall in near anatomic alignment. IMPRESSION: Comminuted obliquely oriented fracture along the base of the radial  styloid which extends from medial to lateral with a fracture fragment extending into the radiocarpal joint at the level of the scapholunate junction. Overall fracture fragments are in near anatomic alignment. No other fractures are evident. No dislocation. No appreciable joint space narrowing or erosion. Electronically Signed   By: Bretta BangWilliam  Woodruff III M.D.   On: 05/29/2018 15:38    ____________________________________________    PROCEDURES  Procedure(s) performed:    Procedures    Medications  ketorolac (TORADOL) 30 MG/ML injection 30 mg (30 mg Intramuscular Given 05/29/18 1614)  oxyCODONE-acetaminophen (PERCOCET/ROXICET) 5-325 MG per tablet 1 tablet (1 tablet Oral Given 05/29/18 1614)     ____________________________________________   INITIAL IMPRESSION / ASSESSMENT AND PLAN / ED COURSE  Pertinent labs & imaging results that were available during my care of the patient were reviewed by me and considered in my medical  decision making (see chart for details).  Review of the [place] CSRS was performed in accordance of the [facility] prior to dispensing any controlled drugs.      Assessment and Plan:  Radius fracture: Patient presents to the emergency department with right wrist pain after a fall that occurred today.  X-ray examination is concerning for distal, comminuted, intra-articular radius fracture.  Patient was splinted in the emergency department and referred to orthopedics.  Roxicet and Toradol were given for pain.  Patient was discharged with a brief course of Roxicet.  Patient was advised to follow-up with orthopedics as soon as possible.  All patient questions were answered.    ____________________________________________  FINAL CLINICAL IMPRESSION(S) / ED DIAGNOSES  Final diagnoses:  Closed fracture of distal end of right radius, unspecified fracture morphology, initial encounter      NEW MEDICATIONS STARTED DURING THIS VISIT:  ED Discharge Orders         Ordered     oxyCODONE-acetaminophen (PERCOCET/ROXICET) 5-325 MG tablet  Every 8 hours PRN     05/29/18 1611    meloxicam (MOBIC) 15 MG tablet  Daily     05/29/18 1611              This chart was dictated using voice recognition software/Dragon. Despite best efforts to proofread, errors can occur which can change the meaning. Any change was purely unintentional.    Orvil FeilWoods, Cadie Sorci M, PA-C 05/29/18 1622    Myrna BlazerSchaevitz, David Matthew, MD 05/29/18 [phone]

## 2018-05-31 DIAGNOSIS — S52509A Unspecified fracture of the lower end of unspecified radius, initial encounter for closed fracture: Secondary | ICD-10-CM | POA: Insufficient documentation

## 2018-06-10 ENCOUNTER — Encounter: Payer: Self-pay | Admitting: Emergency Medicine

## 2018-06-10 ENCOUNTER — Emergency Department: Payer: Medicare Other

## 2018-06-10 ENCOUNTER — Emergency Department
Admission: EM | Admit: 2018-06-10 | Discharge: 2018-06-10 | Disposition: A | Discharge status: Home / Self Care | Payer: Medicare Other | Attending: Emergency Medicine | Admitting: Emergency Medicine

## 2018-06-10 DIAGNOSIS — R2242 Localized swelling, mass and lump, left lower limb: Secondary | ICD-10-CM | POA: Insufficient documentation

## 2018-06-10 DIAGNOSIS — I10 Essential (primary) hypertension: Secondary | ICD-10-CM | POA: Insufficient documentation

## 2018-06-10 DIAGNOSIS — M7052 Other bursitis of knee, left knee: Secondary | ICD-10-CM | POA: Diagnosis not present

## 2018-06-10 DIAGNOSIS — IMO0002 Reserved for concepts with insufficient information to code with codable children: Secondary | ICD-10-CM

## 2018-06-10 DIAGNOSIS — Z7984 Long term (current) use of oral hypoglycemic drugs: Secondary | ICD-10-CM | POA: Diagnosis not present

## 2018-06-10 DIAGNOSIS — E119 Type 2 diabetes mellitus without complications: Secondary | ICD-10-CM | POA: Diagnosis not present

## 2018-06-10 DIAGNOSIS — Y939 Activity, unspecified: Secondary | ICD-10-CM | POA: Insufficient documentation

## 2018-06-10 DIAGNOSIS — R229 Localized swelling, mass and lump, unspecified: Secondary | ICD-10-CM

## 2018-06-10 DIAGNOSIS — Z79899 Other long term (current) drug therapy: Secondary | ICD-10-CM | POA: Diagnosis not present

## 2018-06-10 LAB — CBC WITH DIFFERENTIAL/PLATELET
Basophils Absolute: 0.1 10*3/uL (ref 0–0.1)
Basophils Relative: 1 %
Eosinophils Absolute: 0.3 10*3/uL (ref 0–0.7)
Eosinophils Relative: 3 %
HCT: 45.3 % (ref 40.0–52.0)
Hemoglobin: 16 g/dL (ref 13.0–18.0)
Lymphocytes Relative: 29 %
Lymphs Abs: 2.5 10*3/uL (ref 1.0–3.6)
MCH: 29 pg (ref 26.0–34.0)
MCHC: 35.3 g/dL (ref 32.0–36.0)
MCV: 82.1 fL (ref 80.0–100.0)
Monocytes Absolute: 0.6 10*3/uL (ref 0.2–1.0)
Monocytes Relative: 6 %
Neutro Abs: 5.3 10*3/uL (ref 1.4–6.5)
Neutrophils Relative %: 61 %
Platelets: 201 10*3/uL (ref 150–440)
RBC: 5.52 MIL/uL (ref 4.40–5.90)
RDW: 13.8 % (ref 11.5–14.5)
WBC: 8.7 10*3/uL (ref 3.8–10.6)

## 2018-06-10 LAB — COMPREHENSIVE METABOLIC PANEL
ALT: 69 U/L — ABNORMAL HIGH (ref 0–44)
AST: 51 U/L — ABNORMAL HIGH (ref 15–41)
Albumin: 4.2 g/dL (ref 3.5–5.0)
Alkaline Phosphatase: 55 U/L (ref 38–126)
Anion gap: 7 (ref 5–15)
BUN: 19 mg/dL (ref 6–20)
CO2: 26 mmol/L (ref 22–32)
Calcium: 9 mg/dL (ref 8.9–10.3)
Chloride: 103 mmol/L (ref 98–111)
Creatinine, Ser: 1.06 mg/dL (ref 0.61–1.24)
GFR calc Af Amer: >60 mL/min (ref >60)
GFR calc non Af Amer: >60 mL/min (ref >60)
Glucose, Bld: 118 mg/dL — ABNORMAL HIGH (ref 70–99)
Potassium: 3.5 mmol/L (ref 3.5–5.1)
Sodium: 136 mmol/L (ref 135–145)
Total Bilirubin: 1 mg/dL (ref 0.3–1.2)
Total Protein: 7.1 g/dL (ref 6.5–8.1)

## 2018-06-10 LAB — URIC ACID: Uric Acid, Serum: 7.1 mg/dL (ref 3.7–8.6)

## 2018-06-10 MED ORDER — MELOXICAM 15 MG PO TABS: 15.0000 mg | ORAL_TABLET | Freq: Every day | ORAL | 1 refills | Status: AC

## 2018-06-10 NOTE — ED Triage Notes (Signed)
Pt to ED with c/o of left knee swelling. Pt has a below knee amputation.
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
## 2018-07-10 ENCOUNTER — Other Ambulatory Visit: Payer: Self-pay

## 2018-07-10 ENCOUNTER — Emergency Department
Admission: EM | Admit: 2018-07-10 | Discharge: 2018-07-10 | Disposition: A | Discharge status: Home / Self Care | Payer: Medicare Other | Attending: Emergency Medicine | Admitting: Emergency Medicine

## 2018-07-10 ENCOUNTER — Encounter: Payer: Self-pay | Admitting: Emergency Medicine

## 2018-07-10 DIAGNOSIS — E119 Type 2 diabetes mellitus without complications: Secondary | ICD-10-CM | POA: Diagnosis not present

## 2018-07-10 DIAGNOSIS — R1011 Right upper quadrant pain: Secondary | ICD-10-CM | POA: Diagnosis present

## 2018-07-10 DIAGNOSIS — R109 Unspecified abdominal pain: Secondary | ICD-10-CM

## 2018-07-10 DIAGNOSIS — N3000 Acute cystitis without hematuria: Secondary | ICD-10-CM | POA: Insufficient documentation

## 2018-07-10 DIAGNOSIS — Z794 Long term (current) use of insulin: Secondary | ICD-10-CM | POA: Insufficient documentation

## 2018-07-10 DIAGNOSIS — I10 Essential (primary) hypertension: Secondary | ICD-10-CM | POA: Diagnosis not present

## 2018-07-10 DIAGNOSIS — N3090 Cystitis, unspecified without hematuria: Secondary | ICD-10-CM

## 2018-07-10 DIAGNOSIS — Z79899 Other long term (current) drug therapy: Secondary | ICD-10-CM | POA: Diagnosis not present

## 2018-07-10 HISTORY — DX: Diverticulitis of intestine, part unspecified, without perforation or abscess without bleeding: K57.92

## 2018-07-10 LAB — COMPREHENSIVE METABOLIC PANEL
ALK PHOS: 56 U/L (ref 38–126)
ALT: 79 U/L — AB (ref 0–44)
ANION GAP: 12 (ref 5–15)
AST: 57 U/L — ABNORMAL HIGH (ref 15–41)
Albumin: 4.6 g/dL (ref 3.5–5.0)
BILIRUBIN TOTAL: 1.6 mg/dL — AB (ref 0.3–1.2)
BUN: 15 mg/dL (ref 6–20)
CALCIUM: 9.3 mg/dL (ref 8.9–10.3)
CO2: 27 mmol/L (ref 22–32)
CREATININE: 1.06 mg/dL (ref 0.61–1.24)
Chloride: 102 mmol/L (ref 98–111)
GFR calc non Af Amer: >60 mL/min (ref >60)
Glucose, Bld: 176 mg/dL — ABNORMAL HIGH (ref 70–99)
Potassium: 3.5 mmol/L (ref 3.5–5.1)
Sodium: 141 mmol/L (ref 135–145)
TOTAL PROTEIN: 7.7 g/dL (ref 6.5–8.1)

## 2018-07-10 LAB — CBC
HCT: 45.2 % (ref 40.0–52.0)
HEMOGLOBIN: 16.3 g/dL (ref 13.0–18.0)
MCH: 30 pg (ref 26.0–34.0)
MCHC: 36 g/dL (ref 32.0–36.0)
MCV: 83.2 fL (ref 80.0–100.0)
PLATELETS: 178 10*3/uL (ref 150–440)
RBC: 5.44 MIL/uL (ref 4.40–5.90)
RDW: 13.8 % (ref 11.5–14.5)
WBC: 7.1 10*3/uL (ref 3.8–10.6)

## 2018-07-10 LAB — URINALYSIS, COMPLETE (UACMP) WITH MICROSCOPIC
Bilirubin Urine: NEGATIVE
HGB URINE DIPSTICK: NEGATIVE
Ketones, ur: 20 mg/dL — AB
Leukocytes, UA: NEGATIVE
NITRITE: NEGATIVE
PH: 6 (ref 5.0–8.0)
PROTEIN: NEGATIVE mg/dL
Specific Gravity, Urine: 1.039 — ABNORMAL HIGH (ref 1.005–1.030)

## 2018-07-10 LAB — LIPASE, BLOOD: Lipase: 34 U/L (ref 11–51)

## 2018-07-10 MED ORDER — CEPHALEXIN 500 MG PO CAPS: 500.0000 mg | ORAL_CAPSULE | Freq: Two times a day (BID) | ORAL | 0 refills | Status: AC

## 2018-07-10 NOTE — Discharge Instructions (Addendum)
Your urinalysis is showing that he may have a bladder infection which would cause the pain you are having.  Take the antibiotic and finish the full course.  Follow-up with your regular doctor.  Return to the ER for new, worsening, or persistent or constant pain, fevers, vomiting, weakness, or any other new or worsening symptoms that concern you.

## 2018-07-10 NOTE — ED Triage Notes (Signed)
Pt c/o lower abdominal with one episode of vomiting. No diarrhea or fevers.  Has urinated today. NAD. VSS.  Alert and oriented.

## 2018-07-10 NOTE — ED Provider Notes (Signed)
[facility] Emergency Department Provider Note ____________________________________________   First MD Initiated Contact with Patient 07/10/18 1244     (approximate)  I have reviewed the triage vital signs and the nursing notes.   HISTORY  Chief Complaint Abdominal Pain    HPI Todd [name] is a 52 y.o. male with PMH as noted below including prior history of diverticulitis presents with initially upper and mid abdominal pain, and now suprapubic area pain since yesterday.  The patient reports that he ate a piece of chicken that he was suspicious might be bad and afterwards started having crampy upper and mid abdominal pain and nausea.  He states that that pain has now resolved, but he has suprapubic pain described as brief stabs which are not constant.  He has a history of BPH and reports increased urinary frequency and dark urine over the last few days.   Past Medical History:  Diagnosis Date  . Anxiety   . 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 Chronic left shoulder pain 02/15/2016  . Depression   . Diabetes mellitus without complication (HCC)   . Dislocation of right thumb 06/05/2016  . Diverticulitis   . Gastroparesis 06/08/2017  . GERD (gastroesophageal reflux disease)   . HTN (hypertension) 05/19/2016  . Hypertension   . MDD (major depressive disorder), recurrent episode (HCC) 05/28/2016  . Neck pain, bilateral 02/15/2016  . Primary osteoarthritis of right knee 01/31/2016   and fingers  . PTSD (post-traumatic stress disorder) 06/05/2016  . Severe recurrent major depression without psychotic features (HCC) 05/19/2016  . Sleep apnea    unable to tolerate CPAP  . Substance induced mood disorder (HCC) 05/19/2016  . Suicidal ideation 05/19/2016  . Type 2 diabetes mellitus with complication, without long-term current use of insulin (HCC) 06/08/2017  . Uncontrolled type 2 diabetes mellitus with hyperglycemia, without long-term current use of insulin  (HCC) 03/12/2017    Patient Active Problem List   Diagnosis Date Noted  . 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 Gastroparesis 06/08/2017  . History of noncompliance with medical treatment 06/08/2017  . Type 2 diabetes mellitus with complication, without long-term current use of insulin (HCC) 06/08/2017  . Uncontrolled type 2 diabetes mellitus with hyperglycemia, without long-term current use of insulin (HCC) 03/12/2017  . 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 Dislocation of right thumb 06/05/2016  . PTSD (post-traumatic stress disorder) 06/05/2016  . MDD (major depressive disorder), recurrent episode (HCC) 05/28/2016  . Substance induced mood disorder (HCC) 05/19/2016  . Severe recurrent major depression without psychotic features (HCC) 05/19/2016  . Suicidal ideation 05/19/2016  . HTN (hypertension) 05/19/2016  . GERD (gastroesophageal reflux disease) 05/19/2016  . Alcohol use disorder, mild, abuse 05/19/2016  . Chronic left shoulder pain 02/15/2016  . Neck pain, bilateral 02/15/2016  . Primary osteoarthritis of right knee 01/31/2016    Past Surgical History:  Procedure Laterality Date  . BACK SURGERY  11/209/18 and 10/08/17   x 2  . COLONOSCOPY WITH PROPOFOL N/A 01/31/2018   Procedure: COLONOSCOPY WITH PROPOFOL;  Surgeon: Scot Jun, MD;  Location: [facility] ENDOSCOPY;  Service: Endoscopy;  Laterality: N/A;  . ESOPHAGOGASTRODUODENOSCOPY (EGD) WITH PROPOFOL N/A 01/31/2018   Procedure: ESOPHAGOGASTRODUODENOSCOPY (EGD) WITH PROPOFOL;  Surgeon: Scot Jun, MD;  Location: [facility] ENDOSCOPY;  Service: Endoscopy;  Laterality: N/A;  . LEG AMPUTATION BELOW KNEE     left  . SHOULDER SURGERY Left     Prior to Admission medications   Medication Sig Start Date End Date  Taking? Authorizing Provider  B-D UF III MINI PEN NEEDLES 31G X 5 MM MISC  05/04/17   [provider]  cephALEXin (KEFLEX) 500 MG capsule Take 1 capsule (500 mg total) by mouth 2 (two) times daily for 10 days.  07/10/18 07/20/18  Dionne Bucy, MD  cloNIDine (CATAPRES) 0.2 MG tablet  04/26/17   [provider]  desvenlafaxine (PRISTIQ) 50 MG 24 hr tablet Take 50 mg by mouth daily. 06/24/17   [provider]  doxepin (SINEQUAN) 50 MG capsule  04/26/17   [provider]  DULoxetine (CYMBALTA) 30 MG capsule Take 30 mg by mouth 2 (two) times daily. 06/15/17   [provider]  FARXIGA 10 MG TABS tablet 10 mg daily.  07/21/17   [provider]  gabapentin (NEURONTIN) 600 MG tablet TAKE 1 TABLET BY MOUTH FOUR TIMES A DAY FOR PAIN 05/04/17   [provider]  glipiZIDE (GLUCOTROL XL) 10 MG 24 hr tablet  07/12/17   [provider]  hydrochlorothiazide (HYDRODIURIL) 25 MG tablet take 1 tablet by mouth once daily for BLOOD PRESSURE 05/04/17   [provider]  IBU 800 MG tablet  04/29/17   [provider]  ibuprofen (ADVIL,MOTRIN) 600 MG tablet Take 1 tablet (600 mg total) every 8 (eight) hours as needed by mouth. 08/26/17   Merrily Brittle, MD  lisinopril (PRINIVIL,ZESTRIL) 40 MG tablet Take 1 tablet (40 mg total) by mouth daily. 05/20/16   Pucilowska, Braulio Conte B, MD  metoCLOPramide (REGLAN) 10 MG tablet Take 1 tablet (10 mg total) by mouth 4 (four) times daily -  before meals and at bedtime. Patient not taking: Reported on 01/31/2018 01/02/16   Sharman Cheek, MD  omeprazole (PRILOSEC) 10 MG capsule Take 10 mg by mouth daily.    [provider]  Dola Argyle LANCETS FINE MISC USE TO TEST three times a day 05/29/17   [provider]  [facility] VERIO test strip  06/23/17   [provider]  sucralfate (CARAFATE) 1 g tablet Take 1 tablet (1 g total) by mouth 4 (four) times daily. Patient not taking: Reported on 01/31/2018 01/02/16   Sharman Cheek, MD    Allergies Benadryl [diphenhydramine] and Diphenhydramine hcl  History reviewed. No pertinent family history.  Social History Social History   Tobacco Use  .  Smoking status: Never Smoker  . Smokeless tobacco: Never Used  Substance Use Topics  . Alcohol use: No  . Drug use: No    Review of Systems  Constitutional: No fever. Eyes: No redness. ENT: No sore throat. Cardiovascular: Denies chest pain. Respiratory: Denies shortness of breath. Gastrointestinal: Positive for nausea but no vomiting.  No diarrhea..  Genitourinary: Negative for dysuria.  Positive for frequency. Musculoskeletal: Negative for back pain. Skin: Negative for rash. Neurological: Negative for headache.   ____________________________________________   PHYSICAL EXAM:  VITAL SIGNS: ED Triage Vitals  Enc Vitals Group     BP 07/10/18 1112 (!) 144/86     Pulse Rate 07/10/18 1112 74     Resp 07/10/18 1112 18     Temp 07/10/18 1112 98.4 F (36.9 C)     Temp Source 07/10/18 1112 Oral     SpO2 07/10/18 1112 97 %     Weight 07/10/18 1112 274 lb (124.3 kg)     Height 07/10/18 1112 5\' 8"  (1.727 m)     Head Circumference --      Peak Flow --      Pain Score 07/10/18 1127 6  Pain Loc --      Pain Edu? --      Excl. in GC? --     Constitutional: Alert and oriented. Well appearing and in no acute distress. Eyes: Conjunctivae are normal.  Head: Atraumatic. Nose: No congestion/rhinnorhea. Mouth/Throat: Mucous membranes are moist.   Neck: Normal range of motion.  Cardiovascular:Good peripheral circulation. Respiratory: Normal respiratory effort. Gastrointestinal: Soft and nontender. No distention.  Genitourinary: No flank tenderness. Musculoskeletal: Extremities warm and well perfused.  Neurologic:  Normal speech and language. No gross focal neurologic deficits are appreciated.  Skin:  Skin is warm and dry. No rash noted. Psychiatric: Mood and affect are normal. Speech and behavior are normal.  ____________________________________________   LABS (all labs ordered are listed, but only abnormal results are displayed)  Labs Reviewed  COMPREHENSIVE METABOLIC  PANEL - Abnormal; Notable for the following components:      Result Value   Glucose, Bld 176 (*)    AST 57 (*)    ALT 79 (*)    Total Bilirubin 1.6 (*)    All other components within normal limits  URINALYSIS, COMPLETE (UACMP) WITH MICROSCOPIC - Abnormal; Notable for the following components:   Color, Urine YELLOW (*)    APPearance CLEAR (*)    Specific Gravity, Urine 1.039 (*)    Glucose, UA >=500 (*)    Ketones, ur 20 (*)    Bacteria, UA RARE (*)    Crystals PRESENT (*)    All other components within normal limits  LIPASE, BLOOD  CBC   ____________________________________________  EKG   ____________________________________________  RADIOLOGY    ____________________________________________   PROCEDURES  Procedure(s) performed: No  Procedures  Critical Care performed: No ____________________________________________   INITIAL IMPRESSION / ASSESSMENT AND PLAN / ED COURSE  Pertinent labs & imaging results that were available during my care of the patient were reviewed by me and considered in my medical decision making (see chart for details).  52 year old male with PMH as noted above including a prior history of diverticulitis presents with crampy intermittent mid abdominal pain which has now resolved, as well as with suprapubic intermittent shooting pain.  He also has some urinary symptoms.  He is mainly concerned because he previously had diverticulitis and was told he could die from it, so he was concerned he was developing it again.  On exam, the patient is well-appearing, vital signs are normal, and the abdomen is soft and completely nontender.  The lab work-up is unremarkable.  However, his UA shows WBCs and bacteria.  Given the low suprapubic location of the pain and the reported urinary symptoms as well as the history of BPH, I feel that cystitis is the most likely etiology.  There is no clinical evidence to support diverticulitis as he has no fever, no  elevated WBC count, no constant lower abdominal pain, and no tenderness on exam.  There is no indication for imaging at this time.  He is stable for discharge home.  I will treat him with Keflex for likely UTI.  I gave him extensive return precautions, and he expressed understanding.  ____________________________________________   FINAL CLINICAL IMPRESSION(S) / ED DIAGNOSES  Final diagnoses:  Cystitis  Abdominal pain, unspecified abdominal location      NEW MEDICATIONS STARTED DURING THIS VISIT:  New Prescriptions   CEPHALEXIN (KEFLEX) 500 MG CAPSULE    Take 1 capsule (500 mg total) by mouth 2 (two) times daily for 10 days.     Note:  This document was  prepared using Conservation officer, historic buildings and may include unintentional dictation errors.    Dionne Bucy, MD 07/10/18 1322

## 2018-08-22 NOTE — Progress Notes (Deleted)
August 23, 2018  11:55 AM   Todd [name] 1966-08-24 [id number]  Referring provider: Preston Fleeting, MD [address] [place], [place] [address]  Chief Complaint  Patient presents with   Benign Prostatic Hypertrophy    HPI: Todd [name] is a 52 year old male who presents with LUTS and BPH. The patient was referred by Presley Raddle MD.   Today, he continues to complain of worsening urinary symptoms over the past 1 to 2 months.  These are primarily urinary urgency with need to get to the bathroom immediately with occasional urge incontinence as well as urinary frequency.  He denies any significant obstructive urinary symptoms. The pt reports not drinking water often and still feels that sensation. Stream of urination is described as good.  IPSS as below.  He was seen in ED on 07/08/18 with acute urinary symptoms. Uranalysis with mildly suspicious for infection with 11-20 WBC/HPF, no culture was sent, treated with Keflex presumed UTI. Most recent PSA of 0.60 on 08/10/2017.Pt is uncircumcised.   The patient reports incontinence, urgency which has been going on for 24 years and describes his urination as painful. Pt says he was told that he had a "thinning of the bladder" after extensive evaluation in [place], [place] at age 24 and sounds like cystoscopy? Possibly interstitial cystitis? Condition has worsened over time intermittently.   Diabetes managed by Dr. Tedd Sias. A1C was 7.8 last month and it is not as good as it ws in the past. Diabetes diagnosed 2 years ago. Denies peripheral neuropathy.  The pt had a left renal cyst which was evaluated Duffy Rhody PA at [facility].  He was also treated for his urinary symptoms and was prescribed Flomax.  He reports that he had retrograde ejaculation and leg rashes that were side-effects due to Flowmax, and he stopped taking this. Cystoscopy perfermed in 09/2017 was normal. Pt reports enlarged prostate which was seen a year ago.   IPSS    Row  Name 08/23/18 1000         International Prostate Symptom Score   How often have you had the sensation of not emptying your bladder?  Almost always     How often have you had to urinate less than every two hours?  Almost always     How often have you found you stopped and started again several times when you urinated?  Not at All     How often have you found it difficult to postpone urination?  More than half the time     How often have you had a weak urinary stream?  Less than half the time     How often have you had to strain to start urination?  Less than half the time     How many times did you typically get up at night to urinate?  1 Time     Total IPSS Score  19       Quality of Life due to urinary symptoms   If you were to spend the rest of your life with your urinary condition just the way it is now how would you feel about that?  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   Chronic left shoulder pain 02/15/2016   Depression    Diabetes mellitus without complication (HCC)    Dislocation  of right thumb 06/05/2016   Diverticulitis    Gastroparesis 06/08/2017   GERD (gastroesophageal reflux disease)    HTN (hypertension) 05/19/2016   Hypertension    MDD (major depressive disorder), recurrent episode (HCC) 05/28/2016   Neck pain, bilateral 02/15/2016   Primary osteoarthritis of right knee 01/31/2016   and fingers   PTSD (post-traumatic stress disorder) 06/05/2016   Severe recurrent major depression without psychotic features (HCC) 05/19/2016   Sleep apnea    unable to tolerate CPAP   Substance induced mood disorder (HCC) 05/19/2016   Suicidal ideation 05/19/2016   Type 2 diabetes mellitus with complication, without long-term current use of insulin (HCC) 06/08/2017   Uncontrolled type 2 diabetes mellitus with hyperglycemia, without long-term  current use of insulin (HCC) 03/12/2017    Surgical History: Past Surgical History:  Procedure Laterality Date   BACK SURGERY  11/209/18 and 10/08/17   x 2   COLONOSCOPY WITH PROPOFOL N/A 01/31/2018   Procedure: COLONOSCOPY WITH PROPOFOL;  Surgeon: Scot Jun, MD;  Location: [facility] ENDOSCOPY;  Service: Endoscopy;  Laterality: N/A;   ESOPHAGOGASTRODUODENOSCOPY (EGD) WITH PROPOFOL N/A 01/31/2018   Procedure: ESOPHAGOGASTRODUODENOSCOPY (EGD) WITH PROPOFOL;  Surgeon: Scot Jun, MD;  Location: [facility] ENDOSCOPY;  Service: Endoscopy;  Laterality: N/A;   LEG AMPUTATION BELOW KNEE     left   SHOULDER SURGERY Left     Home Medications:  Allergies as of 08/23/2018      Reactions   Benadryl [diphenhydramine] Anxiety, Other (See Comments)   Causes "jerking"   Diphenhydramine Hcl Anxiety, Other (See Comments)   Causes "jerking"   Tamsulosin Rash      Medication List        Accurate as of 08/23/18 11:55 AM. Always use your most recent med list.          B-D UF III MINI PEN NEEDLES 31G X 5 MM Misc Generic drug:  Insulin Pen Needle   cloNIDine 0.2 MG tablet Commonly known as:  CATAPRES   glipiZIDE 10 MG 24 hr tablet Commonly known as:  GLUCOTROL XL   hydrochlorothiazide 25 MG tablet Commonly known as:  HYDRODIURIL take 1 tablet by mouth once daily for BLOOD PRESSURE   ibuprofen 600 MG tablet Commonly known as:  ADVIL,MOTRIN Take 1 tablet (600 mg total) every 8 (eight) hours as needed by mouth.   JANUVIA 100 MG tablet Generic drug:  sitaGLIPtin Take 100 mg by mouth.   lisinopril 40 MG tablet Commonly known as:  PRINIVIL,ZESTRIL Take 1 tablet (40 mg total) by mouth daily.   multivitamin tablet Take 1 tablet by mouth daily.   omeprazole 10 MG capsule Commonly known as:  PRILOSEC Take 10 mg by mouth daily.   ONETOUCH DELICA LANCETS FINE Misc USE TO TEST three times a day   ONETOUCH VERIO test strip Generic drug:  glucose blood       Allergies:    Allergies  Allergen Reactions   Benadryl [Diphenhydramine] Anxiety and Other (See Comments)    Causes "jerking"   Diphenhydramine Hcl Anxiety and Other (See Comments)    Causes "jerking"   Tamsulosin Rash    Family History: History reviewed. No pertinent family history.  Social History:  reports that he has quit smoking. He quit after 11.00 years of use. He has never used smokeless tobacco. He reports that he does not drink alcohol or use drugs.  ROS: UROLOGY Frequent Urination?: Yes Hard to postpone urination?: No Burning/pain with urination?: Yes Get up at night to urinate?:  Yes Leakage of urine?: Yes Urine stream starts and stops?: No Trouble starting stream?: No Do you have to strain to urinate?: No Blood in urine?: No Urinary tract infection?: No Sexually transmitted disease?: No Injury to kidneys or bladder?: No Painful intercourse?: No Weak stream?: No Erection problems?: No Penile pain?: No  Gastrointestinal Nausea?: No Vomiting?: No Indigestion/heartburn?: No Diarrhea?: No Constipation?: No  Constitutional Fever: No Night sweats?: No Weight loss?: No Fatigue?: No  Skin Skin rash/lesions?: No Itching?: No  Eyes Blurred vision?: Yes Double vision?: No  Ears/Nose/Throat Sore throat?: No Sinus problems?: No  Hematologic/Lymphatic Swollen glands?: No Easy bruising?: No  Cardiovascular Leg swelling?: No Chest pain?: Yes  Respiratory Cough?: No Shortness of breath?: No  Endocrine Excessive thirst?: No  Musculoskeletal Back pain?: Yes Joint pain?: Yes  Neurological Headaches?: Yes Dizziness?: No  Psychologic Depression?: Yes  Physical Exam: BP (!) 153/103    Pulse 99    Wt 273 lb (123.8 kg)    BMI 41.51 kg/m   Constitutional:  Alert and oriented, No acute distress. HEENT: [place] AT, moist mucus membranes.  Trachea midline, no masses. Cardiovascular: No clubbing, cyanosis, or edema. Respiratory: Normal respiratory effort, no  increased work of breathing. GI: Abdomen is obese, urinal midline incision scar  GU: No CVA tenderness, bilateral descended testicles,without masses and tenderness, normal scrotum, uncircumzisd phallus with orthotopic patent meatus, foreskin easily retractable, normal sphincter tone 50 g prostate non tender, rubbery lateral lobes Skin: No rashes, bruises or suspicious lesions. Extremities: LLE surgically absent Neurologic: Grossly intact, no focal deficits, moving all 4 extremities. Psychiatric: Normal mood and affect.  Laboratory Data: Lab Results  Component Value Date   WBC 7.1 07/10/2018   HGB 16.3 07/10/2018   HCT 45.2 07/10/2018   MCV 83.2 07/10/2018   PLT 178 07/10/2018    Lab Results  Component Value Date   CREATININE 1.06 07/10/2018   Urinalysis UA today reviewed, 11-30 blood cells per high-power field, otherwise unremarkable.  See epic for details.    Pertinent Imaging:  Results for orders placed or performed in visit on 08/23/18  Bladder Scan (Post Void Residual) in office  Result Value Ref Range   Scan Result 16mL     Assessment & Plan:    1. BPH with urinary frequency  -Symptoms are primarily irritated with urgency and frequency -Adequate bladder emptying today -Likely multifactorial including diabetes, behavioral possibly with obstruction as contributing factor (BPH)  -Advised that blood sugars should be under control  -Trial of Mirabegron 50 gm for 4 weeks to treat overactivity symptoms  -Consider addition of Finasteride to treat prostate for optimization pending reassessment.   2. PSA Screening  -PSA drawn today and pending  -Will continue to follow-up   Return in about 4 weeks (around 09/20/2018) for f/u shannon for IPSS/ PVR.   Follow up with Carollee Herter PA-C for IPSS and PVR.   [facility] [address] [place], [place] [address] [phone]  I, Donne Hazel, am acting as a scribe for Dr. [name],  I have reviewed the above documentation for accuracy and completeness, and I agree with the above.   [name], MD

## 2018-08-23 ENCOUNTER — Encounter: Payer: Self-pay | Admitting: Urology

## 2018-08-23 ENCOUNTER — Ambulatory Visit (INDEPENDENT_AMBULATORY_CARE_PROVIDER_SITE_OTHER): Payer: Medicare Other | Admitting: Urology

## 2018-08-23 ENCOUNTER — Telehealth: Payer: Self-pay

## 2018-08-23 ENCOUNTER — Other Ambulatory Visit: Payer: Self-pay

## 2018-08-23 VITALS — BP 153/103 | HR 99 | Wt 273.0 lb

## 2018-08-23 DIAGNOSIS — N401 Enlarged prostate with lower urinary tract symptoms: Secondary | ICD-10-CM | POA: Diagnosis not present

## 2018-08-23 DIAGNOSIS — Z125 Encounter for screening for malignant neoplasm of prostate: Secondary | ICD-10-CM | POA: Diagnosis not present

## 2018-08-23 DIAGNOSIS — R3915 Urgency of urination: Secondary | ICD-10-CM | POA: Diagnosis not present

## 2018-08-23 LAB — URINALYSIS, COMPLETE
Bilirubin, UA: NEGATIVE
KETONES UA: NEGATIVE
NITRITE UA: NEGATIVE
RBC, UA: NEGATIVE
UUROB: 1 mg/dL (ref 0.2–1.0)
pH, UA: 5.5 (ref 5.0–7.5)

## 2018-08-23 LAB — MICROSCOPIC EXAMINATION

## 2018-08-23 LAB — BLADDER SCAN AMB NON-IMAGING

## 2018-08-23 NOTE — Progress Notes (Signed)
August 23, 2018  11:55 AM   Todd [name] 12/22/1965 [id number]  Referring provider: Preston Fleetingevelo, Adrian Mancheno, MD [address] [place], [place] [address]  Chief Complaint  Patient presents with  . Benign Prostatic Hypertrophy    HPI: Todd Manningngel Sigal is a 52 year old male who presents with LUTS and BPH. The patient was referred by Presley RaddleAdrian Mancheno MD.   Today, he continues to complain of worsening urinary symptoms over the past 1 to 2 months.  These are primarily urinary urgency with need to get to the bathroom immediately with occasional urge incontinence as well as urinary frequency.  He denies any significant obstructive urinary symptoms. The pt reports not drinking water often and still feels that sensation. Stream of urination is described as good.  IPSS as below.  He was seen in ED on 07/08/18 with acute urinary symptoms. Uranalysis with mildly suspicious for infection with 11-20 WBC/HPF, no culture was sent, treated with Keflex presumed UTI. Most recent PSA of 0.60 on 08/10/2017.Pt is uncircumcised.   The patient reports incontinence, urgency which has been going on for 24 years and describes his urination as painful. Pt says he was told that he had a "thinning of the bladder" after [place] age 52 and sounds like cystoscopy? Possibly interstitial cystitis? Condition has worsened over time intermittently.   Diabetes managed by Dr. Tedd SiasSolum. A1C was 7.8 last month and it is not as good as it ws in the past. Diabetes diagnosed 2 years ago. Denies peripheral neuropathy.  The pt had a left renal cyst which was [facility]  He was also treated for his urinary symptoms and was prescribed Flomax.  He reports that he had retrograde ejaculation and leg rashes that were side-effects due to Flowmax, and he stopped taking this. Cystoscopy perfermed in 09/2017 was normal. Pt reports enlarged prostate which was seen a year ago.   IPSS    Row  Name 08/23/18 1000         International Prostate Symptom Score   How often have you had the sensation of not emptying your bladder?  Almost always     How often have you had to urinate less than every two hours?  Almost always     How often have you found you stopped and started again several times when you urinated?  Not at All     How often have you found it difficult to postpone urination?  More than half the time     How often have you had a weak urinary stream?  Less than half the time     How often have you had to strain to start urination?  Less than half the time     How many times did you typically get up at night to urinate?  1 Time     Total IPSS Score  19       Quality of Life due to urinary symptoms   If you were to spend the rest of your life with your urinary condition just the way it is now how would you feel about that?  Unhappy       Score:  1-7 Mild 8-19 Moderate 20-35 Severe  PMH: Past Medical History:  Diagnosis Date  . Anxiety   . 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 Chronic left shoulder pain 02/15/2016  . Depression   . Diabetes mellitus without complication (HCC)   . Dislocation  of right thumb 06/05/2016  . Diverticulitis   . Gastroparesis 06/08/2017  . GERD (gastroesophageal reflux disease)   . HTN (hypertension) 05/19/2016  . Hypertension   . MDD (major depressive disorder), recurrent episode (HCC) 05/28/2016  . Neck pain, bilateral 02/15/2016  . Primary osteoarthritis of right knee 01/31/2016   and fingers  . PTSD (post-traumatic stress disorder) 06/05/2016  . Severe recurrent major depression without psychotic features (HCC) 05/19/2016  . Sleep apnea    unable to tolerate CPAP  . Substance induced mood disorder (HCC) 05/19/2016  . Suicidal ideation 05/19/2016  . Type 2 diabetes mellitus with complication, without long-term current use of insulin (HCC) 06/08/2017  . Uncontrolled type 2 diabetes mellitus with hyperglycemia, without long-term  current use of insulin (HCC) 03/12/2017    Surgical History: Past Surgical History:  Procedure Laterality Date  . BACK SURGERY  11/209/18 and 10/08/17   x 2  . COLONOSCOPY WITH PROPOFOL N/A 01/31/2018   Procedure: COLONOSCOPY WITH PROPOFOL;  Surgeon: Scot Jun, MD;  Location[facility]COPY;  Service: Endoscopy;  Laterality: N/A;  . ESOPHAGOGASTRODUODENOSCOPY (EGD) WITH PROPOFOL N/A 01/31/2018   Procedure: ESOPHAGOGASTRODUODENOSCOPY (EGD) WITH PROPOFOL;  Surgeon: Scot Jun, MD;  Location[facility] ENDOSCOPY;  Service: Endoscopy;  Laterality: N/A;  . LEG AMPUTATION BELOW KNEE     left  . SHOULDER SURGERY Left     Home Medications:  Allergies as of 08/23/2018      Reactions   Benadryl [diphenhydramine] Anxiety, Other (See Comments)   Causes "jerking"   Diphenhydramine Hcl Anxiety, Other (See Comments)   Causes "jerking"   Tamsulosin Rash      Medication List        Accurate as of 08/23/18 11:55 AM. Always use your most recent med list.          B-D UF III MINI PEN NEEDLES 31G X 5 MM Misc Generic drug:  Insulin Pen Needle   cloNIDine 0.2 MG tablet Commonly known as:  CATAPRES   glipiZIDE 10 MG 24 hr tablet Commonly known as:  GLUCOTROL XL   hydrochlorothiazide 25 MG tablet Commonly known as:  HYDRODIURIL take 1 tablet by mouth once daily for BLOOD PRESSURE   ibuprofen 600 MG tablet Commonly known as:  ADVIL,MOTRIN Take 1 tablet (600 mg total) every 8 (eight) hours as needed by mouth.   JANUVIA 100 MG tablet Generic drug:  sitaGLIPtin Take 100 mg by mouth.   lisinopril 40 MG tablet Commonly known as:  PRINIVIL,ZESTRIL Take 1 tablet (40 mg total) by mouth daily.   multivitamin tablet Take 1 tablet by mouth daily.   omeprazole 10 MG capsule Commonly known as:  PRILOSEC Take 10 mg by mouth daily.   ONETOUCH DELICA LANCETS FINE Misc USE TO TEST three times a day   ONETOUCH VERIO test strip Generic drug:  glucose blood       Allergies:    Allergies  Allergen Reactions  . Benadryl [Diphenhydramine] Anxiety and Other (See Comments)    Causes "jerking"  . Diphenhydramine Hcl Anxiety and Other (See Comments)    Causes "jerking"  . Tamsulosin Rash    Family History: History reviewed. No pertinent family history.  Social History:  reports that he has quit smoking. He quit after 11.00 years of use. He has never used smokeless tobacco. He reports that he does not drink alcohol or use drugs.  ROS: UROLOGY Frequent Urination?: Yes Hard to postpone urination?: No Burning/pain with urination?: Yes Get up at night to urinate?:  Yes Leakage of urine?: Yes Urine stream starts and stops?: No Trouble starting stream?: No Do you have to strain to urinate?: No Blood in urine?: No Urinary tract infection?: No Sexually transmitted disease?: No Injury to kidneys or bladder?: No Painful intercourse?: No Weak stream?: No Erection problems?: No Penile pain?: No  Gastrointestinal Nausea?: No Vomiting?: No Indigestion/heartburn?: No Diarrhea?: No Constipation?: No  Constitutional Fever: No Night sweats?: No Weight loss?: No Fatigue?: No  Skin Skin rash/lesions?: No Itching?: No  Eyes Blurred vision?: Yes Double vision?: No  Ears/Nose/Throat Sore throat?: No Sinus problems?: No  Hematologic/Lymphatic Swollen glands?: No Easy bruising?: No  Cardiovascular Leg swelling?: No Chest pain?: Yes  Respiratory Cough?: No Shortness of breath?: No  Endocrine Excessive thirst?: No  Musculoskeletal Back pain?: Yes Joint pain?: Yes  Neurological Headaches?: Yes Dizziness?: No  Psychologic Depression?: Yes  Physical Exam: BP (!) 153/103   Pulse 99   Wt 273 lb (123.8 kg)   BMI 41.51 kg/m   Constitutional:  Alert and oriented, No acute distress. HEENT: [place] AT, moist mucus membranes.  Trachea midline, no masses. Cardiovascular: No clubbing, cyanosis, or edema. Respiratory: Normal respiratory effort, no  increased work of breathing. GI: Abdomen is obese, urinal midline incision scar  GU: No CVA tenderness, bilateral descended testicles,without masses and tenderness, normal scrotum, uncircumzisd phallus with orthotopic patent meatus, foreskin easily retractable, normal sphincter tone 50 g prostate non tender, rubbery lateral lobes Skin: No rashes, bruises or suspicious lesions. Extremities: LLE surgically absent Neurologic: Grossly intact, no focal deficits, moving all 4 extremities. Psychiatric: Normal mood and affect.  Laboratory Data: Lab Results  Component Value Date   WBC 7.1 07/10/2018   HGB 16.3 07/10/2018   HCT 45.2 07/10/2018   MCV 83.2 07/10/2018   PLT 178 07/10/2018    Lab Results  Component Value Date   CREATININE 1.06 07/10/2018   Urinalysis UA today reviewed, 11-30 blood cells per high-power field, otherwise unremarkable.  See epic for details.    Pertinent Imaging:  Results for orders placed or performed in visit on 08/23/18  Bladder Scan (Post Void Residual) in office  Result Value Ref Range   Scan Result 16mL     Assessment & Plan:    1. BPH with urinary frequency  -Symptoms are primarily irritated with urgency and frequency -Adequate bladder emptying today -Likely multifactorial including diabetes, behavioral possibly with obstruction as contributing factor (BPH)  -Advised that blood sugars should be under control  -Trial of Mirabegron 50 gm for 4 weeks to treat overactivity symptoms  -Consider addition of Finasteride to treat prostate for optimization pending reassessment.   2. PSA Screening  -PSA drawn today and pending  -Will continue to follow-up   Return in about 4 weeks (around 09/20/2018) for f/u shannon for IPSS/ PVR.   Follow up with Carollee Herter PA-C for IPSS and PVR.   [facility] [address] [place], [place] [address] [phone]  I, Donne Hazel, am acting as a scribe for Dr. [name],  I have reviewed the above documentation for accuracy and completeness, and I agree with the above.   [name], MD  I spent 45 min with this patient of which greater than 50% was spent in counseling and coordination of care with the patient.

## 2018-08-23 NOTE — Telephone Encounter (Signed)
ERROR

## 2018-08-24 ENCOUNTER — Telehealth: Payer: Self-pay

## 2018-08-24 LAB — PSA: Prostate Specific Ag, Serum: 2.4 ng/mL (ref 0.0–4.0)

## 2018-08-24 NOTE — Telephone Encounter (Signed)
-----   Message from Vanna ScotlandAshley Brandon, MD sent at 08/24/2018  8:10 AM EST ----- PSA is normal.    Vanna ScotlandAshley Brandon, MD

## 2018-08-24 NOTE — Telephone Encounter (Signed)
Pt informed

## 2018-08-26 ENCOUNTER — Telehealth: Payer: Self-pay

## 2018-08-26 LAB — CULTURE, URINE COMPREHENSIVE

## 2018-08-26 MED ORDER — SULFAMETHOXAZOLE-TRIMETHOPRIM 800-160 MG PO TABS: 1.0000 | ORAL_TABLET | Freq: Two times a day (BID) | ORAL | 0 refills | Status: DC

## 2018-08-26 NOTE — Telephone Encounter (Signed)
Patient notified and script sent 

## 2018-08-26 NOTE — Telephone Encounter (Signed)
-----   Message from Vanna ScotlandAshley Brandon, MD sent at 08/26/2018  3:02 PM EST ----- This patient ended up growing bacteria in his urine, albeit a low colony count.  Lets treat him with Bactrim DS twice daily for 7 days.  Vanna ScotlandAshley Brandon, MD

## 2018-09-06 ENCOUNTER — Other Ambulatory Visit: Payer: Self-pay | Admitting: Urology

## 2018-09-06 ENCOUNTER — Telehealth: Payer: Self-pay | Admitting: Urology

## 2018-09-06 MED ORDER — OXYBUTYNIN CHLORIDE 5 MG PO TABS: 5.0000 mg | ORAL_TABLET | Freq: Three times a day (TID) | ORAL | 0 refills | Status: DC

## 2018-09-06 NOTE — Telephone Encounter (Signed)
Patient called and said that he is having a bad reaction to the Myrbetriq you gave him. He states that it is making him dizzy and giving him bad headaches. He also said that he has bad panic attacks and when this happens it causes him to have one. I advised him to stop taking it for now until he hears back from us. Also he stated that he has finished the bactrim but is still having the same symptoms, does he need an appointment?    Marcelino DusterMichelle

## 2018-09-06 NOTE — Telephone Encounter (Signed)
Spoke with patient and advised him to stop myrbetriq and switch to oxybutinin.  Rx sent to pharmacy.  Patient agreeable and will call if any other issues.

## 2018-09-06 NOTE — Telephone Encounter (Signed)
In lieu of Myrbetriq, will try oxybutynin 5 mg 3 times daily.  He should already have a follow-up next month as scheduled.  Vanna ScotlandAshley Jashiya Bassett, MD

## 2018-09-23 ENCOUNTER — Encounter: Payer: Self-pay | Admitting: Urology

## 2018-09-23 ENCOUNTER — Ambulatory Visit (INDEPENDENT_AMBULATORY_CARE_PROVIDER_SITE_OTHER): Payer: Medicare Other | Admitting: Urology

## 2018-09-23 VITALS — BP 145/84 | HR 92 | Ht 68.0 in | Wt 273.4 lb

## 2018-09-23 DIAGNOSIS — R102 Pelvic and perineal pain: Secondary | ICD-10-CM

## 2018-09-23 DIAGNOSIS — N401 Enlarged prostate with lower urinary tract symptoms: Secondary | ICD-10-CM | POA: Diagnosis not present

## 2018-09-23 DIAGNOSIS — R3915 Urgency of urination: Secondary | ICD-10-CM

## 2018-09-23 LAB — URINALYSIS, COMPLETE
BILIRUBIN UA: NEGATIVE
Glucose, UA: NEGATIVE
KETONES UA: NEGATIVE
Nitrite, UA: NEGATIVE
PH UA: 5 (ref 5.0–7.5)
PROTEIN UA: NEGATIVE
RBC UA: NEGATIVE
UUROB: 0.2 mg/dL (ref 0.2–1.0)

## 2018-09-23 LAB — BLADDER SCAN AMB NON-IMAGING: Scan Result: 14

## 2018-09-23 LAB — MICROSCOPIC EXAMINATION: RBC, UA: NONE SEEN /hpf (ref 0–2)

## 2018-09-23 NOTE — Progress Notes (Signed)
09/23/2018 7:49 PM   Todd [name] 05-16-1966 [id number]  Referring provider: Preston Fleeting, MD [address] [place], [place] [address]  Chief Complaint  Patient presents with  . Benign Prostatic Hypertrophy    HPI: Todd [name] is a 52 yo M who returns today for the evaluation and management of BPH and LUTS. His last visit with [place] was on 08/23/2018.   He c/o of frequency, nocturia and incontinence. He was previously on Mrybetriq which gave him dizinness and headaches. He switched to Oxybutynin 5 mg 3x daily.  He states he has noticed some improvement with the oxybutynin.  He has noticed a dry mouth with this medication.   He had a UA culture from 08/23/2018 positive for Citrobacter koseri which was treated with Bactrim.  He reports of incontinence, urgency which has been going on for 24 years .He also reports of suprapubic pressure and intermittent burning symptoms. Pt reports he was told that he had a "thinning of the bladder" after extensive evaluation in [place], [place] at age 39.  He states he was taking to the OR and had what sounds like a cystoscopy with fulguration and hydro distention.  He also describes a test done in the office where they put in a solution and it burned which sounds like a potassium sensitivity test.  He also describes a solution that was placed in his bladder several times which sounds like rescues solutions.  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  He has a FHx of lung, throat and intestinal cancer.   He also reports of an itching sensation on the inside shaft of his penis where he feels like he is about to urinate but does not. He does not smoke or drink. He has DM.   Today is I PSS is 13/3 and UA shows many bacteria. His PVR is 14 mL. Most recent HgbA1c was 7.8% in 06/2018.  He still continues to eat a lot of rice as he is [place] and this is a mainstay of his diet.  He states he is drinking a lot of water.     IPSS    Row Name  09/23/18 1000         International Prostate Symptom Score   How often have you had the sensation of not emptying your bladder?  Not at All     How often have you had to urinate less than every two hours?  Less than half the time     How often have you found you stopped and started again several times when you urinated?  Not at All     How often have you found it difficult to postpone urination?  Almost always     How often have you had a weak urinary stream?  About half the time     How often have you had to strain to start urination?  Less than half the time     How many times did you typically get up at night to urinate?  1 Time     Total IPSS Score  13       Quality of Life due to urinary symptoms   If you were to spend the rest of your life with your urinary condition just the way it is now how would you feel about that?  Mixed        Score:  1-7 Mild 8-19 Moderate 20-35 Severe   PMH: Past Medical History:  Diagnosis Date  .  Anxiety   . 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 Chronic left shoulder pain 02/15/2016  . Depression   . Diabetes mellitus without complication (HCC)   . Dislocation of right thumb 06/05/2016  . Diverticulitis   . Gastroparesis 06/08/2017  . GERD (gastroesophageal reflux disease)   . HTN (hypertension) 05/19/2016  . Hypertension   . MDD (major depressive disorder), recurrent episode (HCC) 05/28/2016  . Neck pain, bilateral 02/15/2016  . Primary osteoarthritis of right knee 01/31/2016   and fingers  . PTSD (post-traumatic stress disorder) 06/05/2016  . Severe recurrent major depression without psychotic features (HCC) 05/19/2016  . Sleep apnea    unable to tolerate CPAP  . Substance induced mood disorder (HCC) 05/19/2016  . Suicidal ideation 05/19/2016  . Type 2 diabetes mellitus with complication, without long-term current use of insulin (HCC) 06/08/2017  . Uncontrolled type 2 diabetes mellitus with hyperglycemia, without long-term current  use of insulin (HCC) 03/12/2017    Surgical History: Past Surgical History:  Procedure Laterality Date  . BACK SURGERY  11/209/18 and 10/08/17   x 2  . COLONOSCOPY WITH PROPOFOL N/A 01/31/2018   Procedure: COLONOSCOPY WITH PROPOFOL;  Surgeon: Scot Jun, MD;  Location[facility] ENDOSCOPY;  Service: Endoscopy;  Laterality: N/A;  . ESOPHAGOGASTRODUODENOSCOPY (EGD) WITH PROPOFOL N/A 01/31/2018   Procedure: ESOPHAGOGASTRODUODENOSCOPY (EGD) WITH PROPOFOL;  Surgeon: Scot Jun, MD;  Location: [facility] ENDOSCOPY;  Service: Endoscopy;  Laterality: N/A;  . LEG AMPUTATION BELOW KNEE     left  . SHOULDER SURGERY Left     Home Medications:  Allergies as of 09/23/2018      Reactions   Myrbetriq [mirabegron] Other (See Comments)   Dizzy, headache   Benadryl [diphenhydramine] Anxiety, Other (See Comments)   Causes "jerking"   Diphenhydramine Hcl Anxiety, Other (See Comments)   Causes "jerking"   Tamsulosin Rash      Medication List       Accurate as of September 23, 2018 11:59 PM. Always use your most recent med list.        B-D UF III MINI PEN NEEDLES 31G X 5 MM Misc Generic drug:  Insulin Pen Needle   cloNIDine 0.2 MG tablet Commonly known as:  CATAPRES   dapagliflozin propanediol 10 MG Tabs tablet Commonly known as:  FARXIGA Take by mouth.   glipiZIDE 10 MG 24 hr tablet Commonly known as:  GLUCOTROL XL   hydrochlorothiazide 25 MG tablet Commonly known as:  HYDRODIURIL take 1 tablet by mouth once daily for BLOOD PRESSURE   ibuprofen 600 MG tablet Commonly known as:  ADVIL,MOTRIN Take 1 tablet (600 mg total) every 8 (eight) hours as needed by mouth.   JANUVIA 100 MG tablet Generic drug:  sitaGLIPtin Take 100 mg by mouth.   lisinopril 40 MG tablet Commonly known as:  PRINIVIL,ZESTRIL Take 1 tablet (40 mg total) by mouth daily.   multivitamin tablet Take 1 tablet by mouth daily.   omeprazole 10 MG capsule Commonly known as:  PRILOSEC Take 10 mg by mouth daily.    ONETOUCH DELICA LANCETS FINE Misc USE TO TEST three times a day   ONETOUCH VERIO test strip Generic drug:  glucose blood   oxybutynin 5 MG tablet Commonly known as:  DITROPAN TAKE 1 TABLET(5 MG) BY MOUTH THREE TIMES DAILY   sulfamethoxazole-trimethoprim 800-160 MG tablet Commonly known as:  BACTRIM DS,SEPTRA DS Take 1 tablet by mouth every 12 (twelve) hours.       Allergies:  Allergies  Allergen  Reactions  . Myrbetriq [Mirabegron] Other (See Comments)    Dizzy, headache  . Benadryl [Diphenhydramine] Anxiety and Other (See Comments)    Causes "jerking"  . Diphenhydramine Hcl Anxiety and Other (See Comments)    Causes "jerking"  . Tamsulosin Rash    Family History: History reviewed. No pertinent family history.  Social History:  reports that he has quit smoking. He quit after 11.00 years of use. He has never used smokeless tobacco. He reports that he does not drink alcohol or use drugs.  ROS: UROLOGY Frequent Urination?: Yes Hard to postpone urination?: No Burning/pain with urination?: No Get up at night to urinate?: Yes Leakage of urine?: Yes Urine stream starts and stops?: No Trouble starting stream?: No Do you have to strain to urinate?: No Blood in urine?: No Urinary tract infection?: No Sexually transmitted disease?: No Injury to kidneys or bladder?: No Painful intercourse?: No Weak stream?: No Erection problems?: No Penile pain?: No  Gastrointestinal Nausea?: Yes Vomiting?: No Indigestion/heartburn?: Yes Diarrhea?: No Constipation?: No  Constitutional Fever: No Night sweats?: No Weight loss?: No Fatigue?: Yes  Skin Skin rash/lesions?: Yes Itching?: No  Eyes Blurred vision?: Yes Double vision?: No  Ears/Nose/Throat Sore throat?: No Sinus problems?: No  Hematologic/Lymphatic Swollen glands?: No Easy bruising?: No  Cardiovascular Leg swelling?: No Chest pain?: Yes  Respiratory Cough?: No Shortness of breath?:  No  Endocrine Excessive thirst?: No  Musculoskeletal Back pain?: Yes Joint pain?: Yes  Neurological Headaches?: Yes Dizziness?: Yes  Psychologic Depression?: Yes Anxiety?: Yes  Physical Exam: BP (!) 145/84 (BP Location: Left Arm, Patient Position: Sitting, Cuff Size: Large)   Pulse 92   Ht 5\' 8"  (1.727 m)   Wt 273 lb 6.4 oz (124 kg)   BMI 41.57 kg/m   Constitutional: Well nourished. Alert and oriented, No acute distress. HEENT: [place] AT, moist mucus membranes. Trachea midline, no masses. Cardiovascular: No clubbing, cyanosis, or edema. Respiratory: Normal respiratory effort, no increased work of breathing. Skin: No rashes, bruises or suspicious lesions. Neurologic: Grossly intact, no focal deficits, moving all 4 extremities. Psychiatric: Normal mood and affect.  Laboratory Data: Component     Latest Ref Rng & Units 08/23/2018  Prostate Specific Ag, Serum     0.0 - 4.0 ng/mL 2.4   Urinalysis His UA is positive for many bacteria.  I have reviewed the labs.  Pertinent Imaging: Component     Latest Ref Rng & Units 08/23/2018 09/23/2018  Scan Result      16mL 14   Assessment & Plan:    1. BPH with urinary frequency -Symptoms are primarily irritated with urgency and frequency -Adequate bladder emptying today; PVR is 14 mL -Likely multifactorial including diabetes, behavioral possibly with obstruction as contributing factor (BPH)  -Advised that blood sugars should be under control - talked about limited rice intake -Discontinued Mirabegron 50 gm to treat overactivity symptoms; could not tolerate medicine due to headaches and dizziness  -Switched to Oxybutynin 5 mg 3x a day - which has improved his symptoms somewhat  2. Suprapubic pain/dysuria UA is positive for many bacteria -will send for culture - if culture is negative - will consider cystoscopy   3. PSA Screening  -PSA 2.4   Return for pending urine culture .  Michiel CowboySHANNON Dornell Grasmick, PA-C  [facility] [address] [place], [place] [address] [phone]  I, Donne Hazelethusan Sivanesan, am acting as a Neurosurgeonscribe for Michiel CowboyShannon Shantrice Rodenberg, PA-C  I have reviewed the above documentation for accuracy and  completeness, and I agree with the above.    Michiel Cowboy, PA-C

## 2018-09-27 LAB — CULTURE, URINE COMPREHENSIVE

## 2018-09-28 ENCOUNTER — Telehealth: Payer: Self-pay

## 2018-09-28 MED ORDER — SULFAMETHOXAZOLE-TRIMETHOPRIM 800-160 MG PO TABS: 1.0000 | ORAL_TABLET | Freq: Two times a day (BID) | ORAL | 0 refills | Status: DC

## 2018-09-28 NOTE — Telephone Encounter (Signed)
Left pt message to call, script sent to pharmacy

## 2018-09-28 NOTE — Telephone Encounter (Signed)
-----   Message from Harle BattiestShannon A McGowan, PA-C sent at 09/27/2018  4:54 PM EST ----- Please let [name] know that his urine culture is positive for infection and we need to start antibiotic.  I recommend Septra DS, twice daily for seven days.

## 2018-09-30 NOTE — Telephone Encounter (Signed)
Patient notified

## 2018-10-06 NOTE — Telephone Encounter (Signed)
Left patient message to call back.

## 2018-10-06 NOTE — Telephone Encounter (Signed)
Returned call to patient.  Patient stated that after taking the medication, Septra, his tongue and mouth began to itch "on the inside" and he got blisters all over his body.  He d/c the medicine and went out of town, but decided to try it again when he returned home. Once again, same reaction occurred. Hasnt taken medication since Monday. Patient allergic to Benadryl.

## 2018-10-06 NOTE — Telephone Encounter (Signed)
Patient left a voice mail message to report that he is experiencing some side effects from the Septra DS.  Patient can be reached at [phone].

## 2018-10-06 NOTE — Telephone Encounter (Signed)
Lets do 10 days of cipro 500mg  BID. Stop Bactrim.  Thanks Legrand RamsBrian Yanis Juma, MD 10/06/2018

## 2018-10-07 IMAGING — CR DG WRIST COMPLETE 3+V*R*
1 series · 4 of 4 positions shown · non-contrast
Comparison: Right hand radiographs May 18, 2016

CLINICAL DATA: Pain following fall

EXAM:
RIGHT WRIST - COMPLETE 3+ VIEW

[Series 1: dg wrist complete right · 0.14mm/px · 4 of 4 slices shown]
[im 1/4]
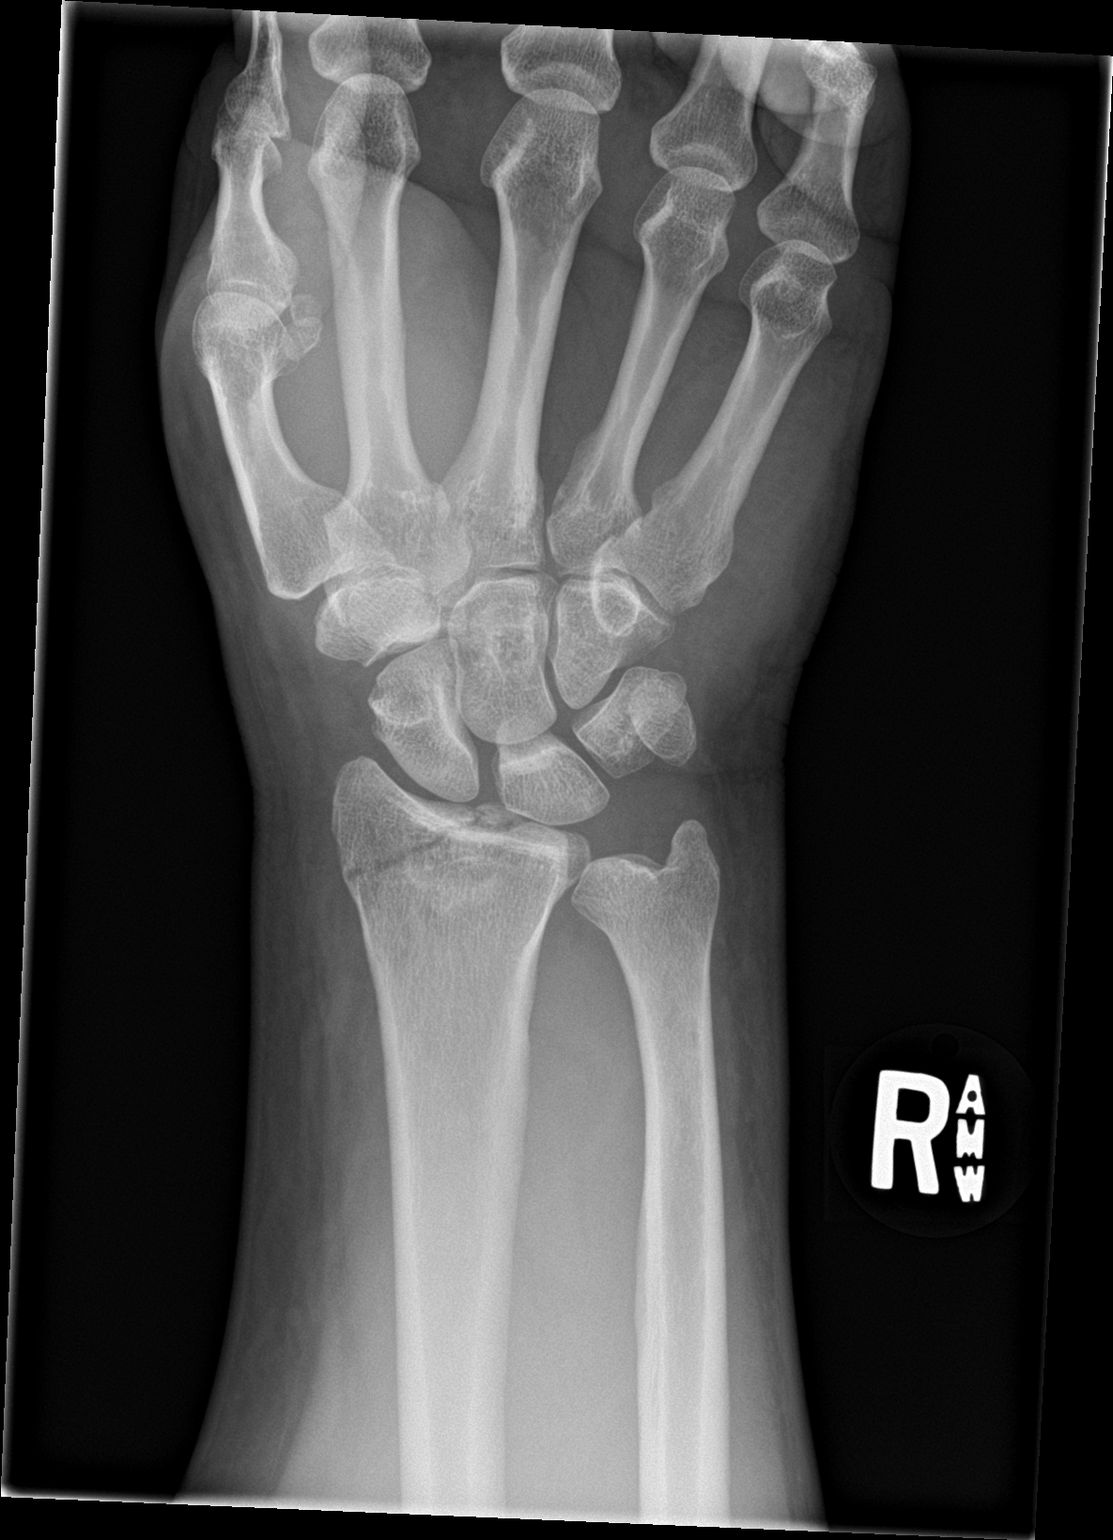
[im 2/4]
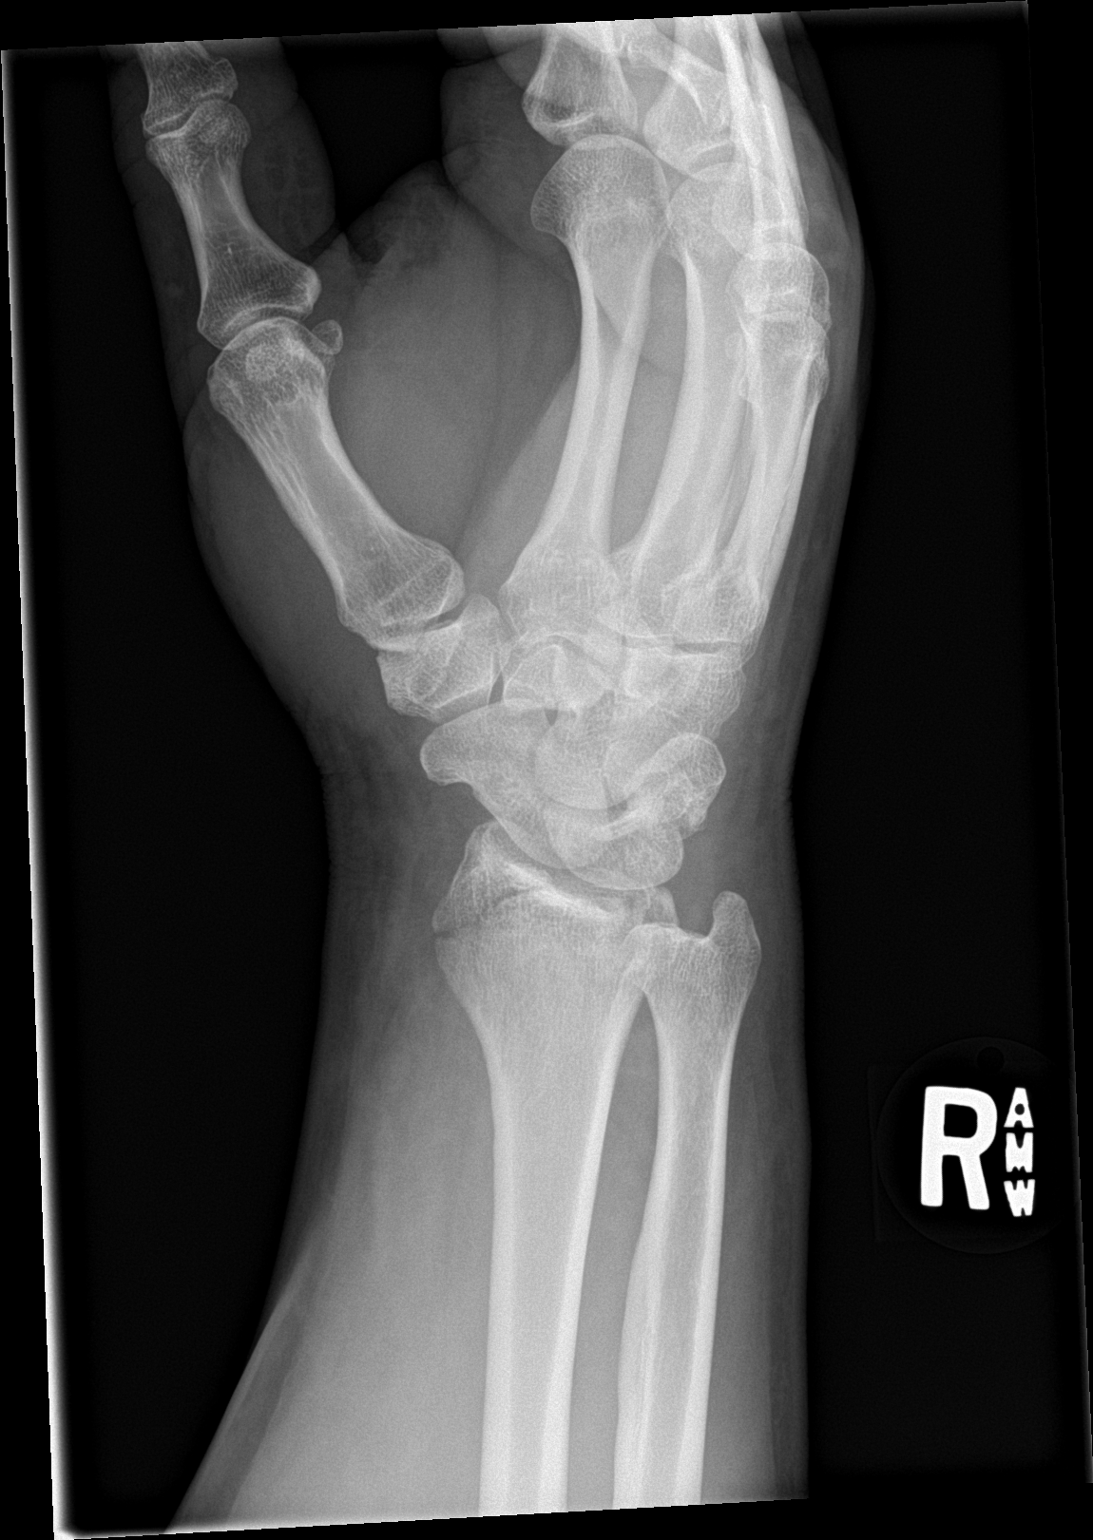
[im 3/4]
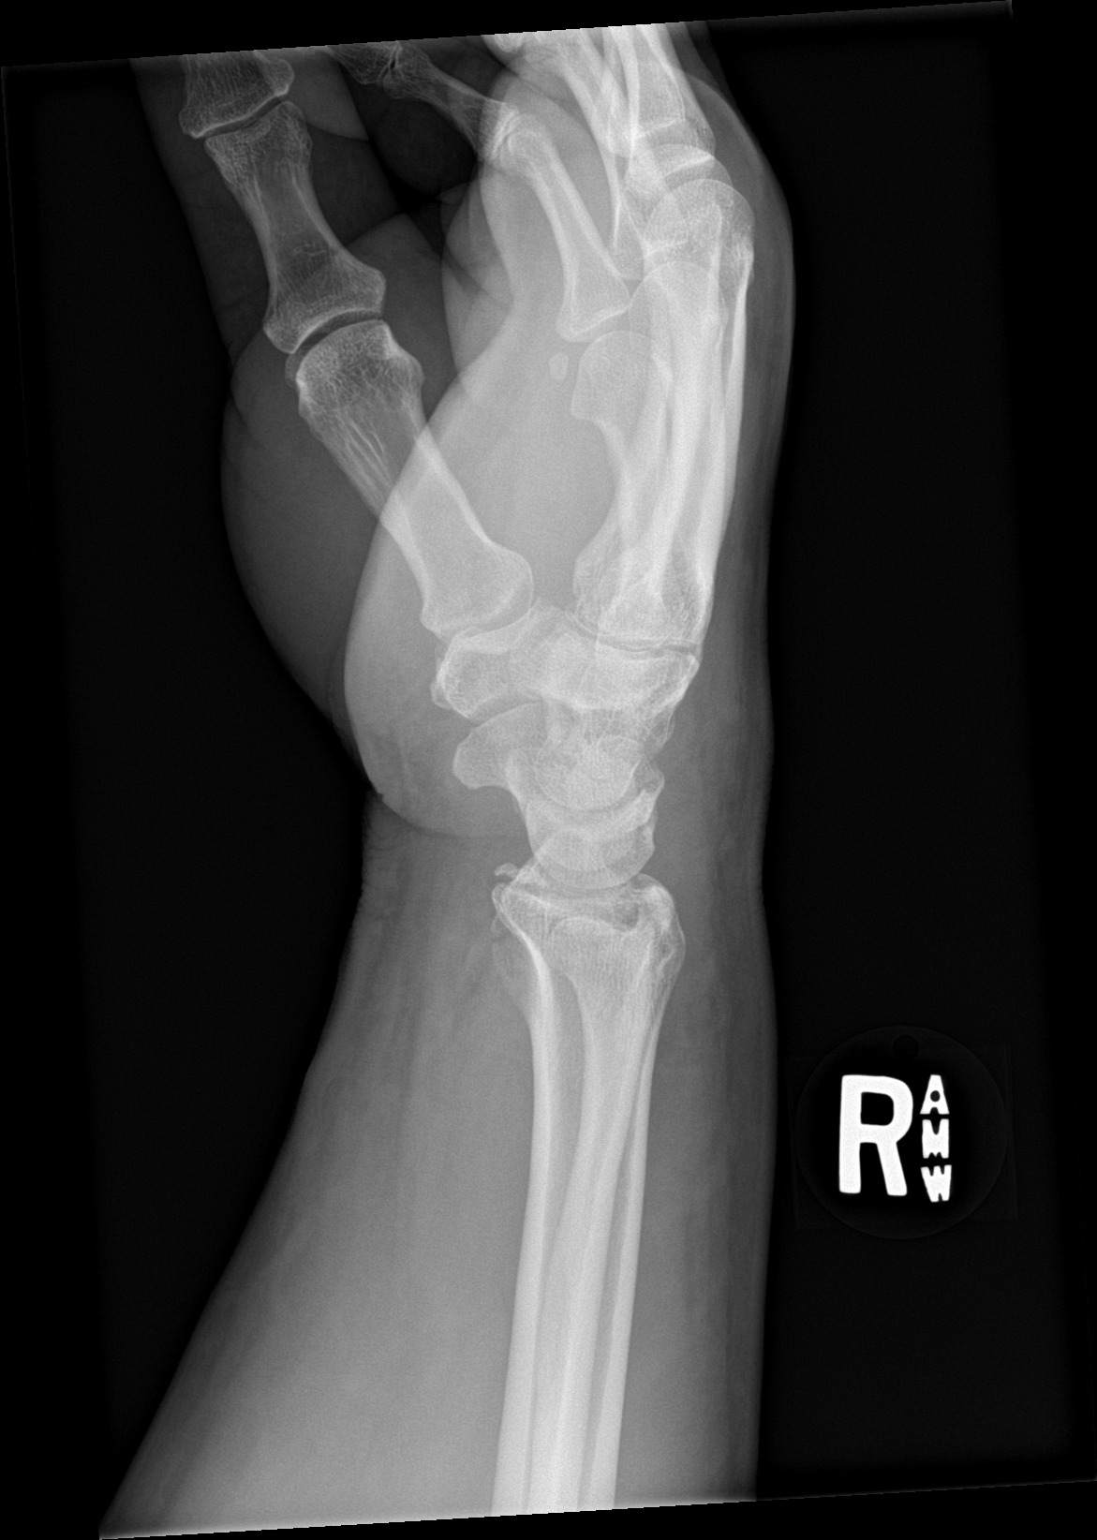
[im 4/4]
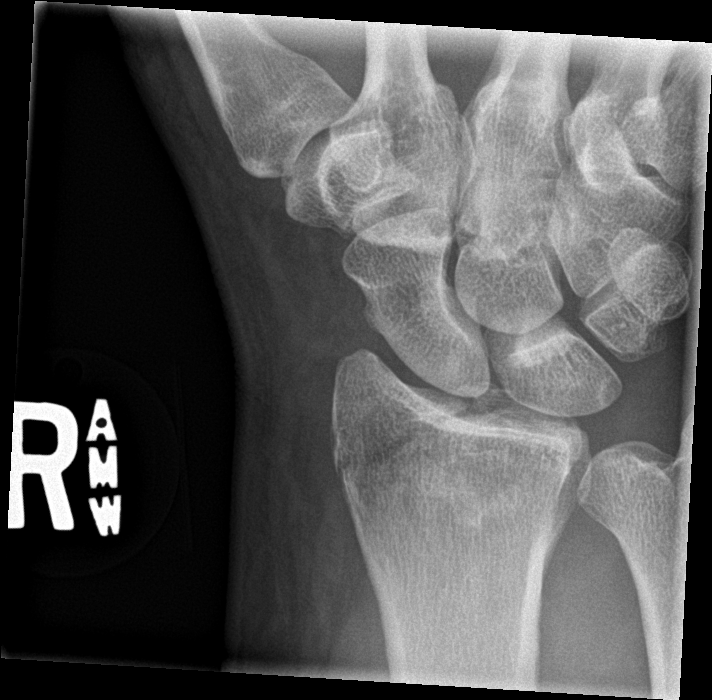

[4 of 4 positions shown; findings below may reference images not displayed]

FINDINGS: Frontal, oblique, lateral, and ulnar deviation scaphoid images were
obtained. There is a comminuted obliquely oriented fracture
involving the distal radius at the level of the base of the radial
styloid. Fracture extends into the radiocarpal joint with a loose
fragment in the radiocarpal joint at the scapholunate level. Major
fracture fragments are overall in near anatomic alignment.
IMPRESSION: Comminuted obliquely oriented fracture along the base of the radial
styloid which extends from medial to lateral with a fracture
fragment extending into the radiocarpal joint at the level of the
scapholunate junction. Overall fracture fragments are in near
anatomic alignment. No other fractures are evident. No dislocation.
No appreciable joint space narrowing or erosion.

## 2018-10-07 MED ORDER — CIPROFLOXACIN HCL 500 MG PO TABS: 500.0000 mg | ORAL_TABLET | Freq: Two times a day (BID) | ORAL | 0 refills | Status: DC

## 2018-10-07 NOTE — Telephone Encounter (Signed)
Spoke with pt and informed him of Dr. Keane ScrapeSninsky's recommendation. Pt understood to not be taking the bactrim and he agreed to try new med. Pt expressed his concerns with trying med due to bad reaction to bactrim. He states he will contact us if he is unable to tolerate cipro. Bactrim has been added to allergy list. Med sent to pharmacy. Nothing further is needed.

## 2018-10-12 ENCOUNTER — Emergency Department
Admission: EM | Admit: 2018-10-12 | Discharge: 2018-10-12 | Disposition: A | Discharge status: Home / Self Care | Payer: Medicare Other | Attending: Emergency Medicine | Admitting: Emergency Medicine

## 2018-10-12 ENCOUNTER — Encounter: Payer: Self-pay | Admitting: Emergency Medicine

## 2018-10-12 ENCOUNTER — Emergency Department: Payer: Medicare Other

## 2018-10-12 ENCOUNTER — Other Ambulatory Visit: Payer: Self-pay

## 2018-10-12 DIAGNOSIS — Z7984 Long term (current) use of oral hypoglycemic drugs: Secondary | ICD-10-CM | POA: Diagnosis not present

## 2018-10-12 DIAGNOSIS — I10 Essential (primary) hypertension: Secondary | ICD-10-CM | POA: Diagnosis not present

## 2018-10-12 DIAGNOSIS — F329 Major depressive disorder, single episode, unspecified: Secondary | ICD-10-CM | POA: Diagnosis not present

## 2018-10-12 DIAGNOSIS — E119 Type 2 diabetes mellitus without complications: Secondary | ICD-10-CM | POA: Insufficient documentation

## 2018-10-12 DIAGNOSIS — Z87891 Personal history of nicotine dependence: Secondary | ICD-10-CM | POA: Insufficient documentation

## 2018-10-12 DIAGNOSIS — F419 Anxiety disorder, unspecified: Secondary | ICD-10-CM | POA: Insufficient documentation

## 2018-10-12 DIAGNOSIS — R079 Chest pain, unspecified: Secondary | ICD-10-CM | POA: Diagnosis not present

## 2018-10-12 DIAGNOSIS — Z79899 Other long term (current) drug therapy: Secondary | ICD-10-CM | POA: Diagnosis not present

## 2018-10-12 LAB — CBC
HEMATOCRIT: 47.7 % (ref 39.0–52.0)
Hemoglobin: 16 g/dL (ref 13.0–17.0)
MCH: 27.6 pg (ref 26.0–34.0)
MCHC: 33.5 g/dL (ref 30.0–36.0)
MCV: 82.2 fL (ref 80.0–100.0)
PLATELETS: 201 10*3/uL (ref 150–400)
RBC: 5.8 MIL/uL (ref 4.22–5.81)
RDW: 12.8 % (ref 11.5–15.5)
WBC: 7.4 10*3/uL (ref 4.0–10.5)
nRBC: 0 % (ref 0.0–0.2)

## 2018-10-12 LAB — BASIC METABOLIC PANEL
Anion gap: 9 (ref 5–15)
BUN: 14 mg/dL (ref 6–20)
CO2: 25 mmol/L (ref 22–32)
CREATININE: 0.98 mg/dL (ref 0.61–1.24)
Calcium: 9.1 mg/dL (ref 8.9–10.3)
Chloride: 102 mmol/L (ref 98–111)
GFR calc Af Amer: >60 mL/min (ref >60)
Glucose, Bld: 264 mg/dL — ABNORMAL HIGH (ref 70–99)
Potassium: 3.6 mmol/L (ref 3.5–5.1)
SODIUM: 136 mmol/L (ref 135–145)

## 2018-10-12 LAB — TROPONIN I: Troponin I: <0.03 ng/mL (ref <0.03)

## 2018-10-12 MED ORDER — LORAZEPAM 0.5 MG PO TABS: 0.5000 mg | ORAL_TABLET | Freq: Three times a day (TID) | ORAL | 0 refills | Status: AC | PRN

## 2018-10-12 MED ORDER — LORAZEPAM 1 MG PO TABS
1.0000 mg | ORAL_TABLET | Freq: Once | ORAL | Status: AC
  Administered 2018-10-12: 1 mg via ORAL
  Filled 2018-10-12: qty 1

## 2018-10-12 NOTE — ED Triage Notes (Signed)
Pulled to triage for repeat troponin

## 2018-10-12 NOTE — ED Triage Notes (Signed)
PT c/o intermit CP and dizziness xfew days. PT states glucose has been in the 200s at home. PT ambulatory, VSS. Hx of anxiety.

## 2018-10-12 NOTE — ED Triage Notes (Signed)
FIRST NURSE NOTE-here for chest pain. Pt wants to make sure not having heart attack. Ambulatory. Unlabored. Pulled next for EKG

## 2018-10-12 NOTE — ED Notes (Signed)
Report history of panic attack and when he gets those he usually gets chest pain along with the attacks. Reports chest pain down to 6/10 at present time. Patient feels this is all a panic attack but came in to be evaluated and to be sure he was not having an MI

## 2018-10-12 NOTE — ED Provider Notes (Signed)
[facility] Emergency Department Provider Note  Time seen: 9:07 PM  I have reviewed the triage vital signs and the nursing notes.   HISTORY  Chief Complaint Chest Pain    HPI Todd [name] is a 53 y.o. male with a past medical history of anxiety, diabetes, gastric reflux, hypertension, PTSD, presents to the emergency department for chest pain abdominal pain or shortness of breath.  According to the patient yesterday he developed shortness of breath intermittent chest and abdominal pains.  Patient states a strong history of anxiety and is had panic attacks with similar symptoms in the past.  However he states because it involves his chest he was concerned so he came to the emergency department today for evaluation.  Patient states mild chest discomfort currently.  Denies any current nausea, denies shortness of breath or diaphoresis.  Largely negative review of systems.   Past Medical History:  Diagnosis Date  . Anxiety   . 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 Chronic left shoulder pain 02/15/2016  . Depression   . Diabetes mellitus without complication (HCC)   . Dislocation of right thumb 06/05/2016  . Diverticulitis   . Gastroparesis 06/08/2017  . GERD (gastroesophageal reflux disease)   . HTN (hypertension) 05/19/2016  . Hypertension   . MDD (major depressive disorder), recurrent episode (HCC) 05/28/2016  . Neck pain, bilateral 02/15/2016  . Primary osteoarthritis of right knee 01/31/2016   and fingers  . PTSD (post-traumatic stress disorder) 06/05/2016  . Severe recurrent major depression without psychotic features (HCC) 05/19/2016  . Sleep apnea    unable to tolerate CPAP  . Substance induced mood disorder (HCC) 05/19/2016  . Suicidal ideation 05/19/2016  . Type 2 diabetes mellitus with complication, without long-term current use of insulin (HCC) 06/08/2017  . Uncontrolled type 2 diabetes mellitus with hyperglycemia, without long-term current use of  insulin (HCC) 03/12/2017    Patient Active Problem List   Diagnosis Date Noted  . Closed fracture of distal end of radius 05/31/2018  . Radiculopathy of lumbosacral region 09/30/2017  . Spondylolisthesis at L5-S1 level 09/30/2017  . Obesity 09/06/2017  . Hepatic steatosis 08/26/2017  . Non-intractable vomiting with nausea 08/26/2017  . Upper abdominal pain 08/26/2017  . 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 Gastroparesis 06/08/2017  . History of noncompliance with medical treatment 06/08/2017  . Type 2 diabetes mellitus with complication, without long-term current use of insulin (HCC) 06/08/2017  . Uncontrolled type 2 diabetes mellitus with hyperglycemia, without long-term current use of insulin (HCC) 03/12/2017  . 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 Dislocation of right thumb 06/05/2016  . PTSD (post-traumatic stress disorder) 06/05/2016  . MDD (major depressive disorder), recurrent episode (HCC) 05/28/2016  . Substance induced mood disorder (HCC) 05/19/2016  . Severe recurrent major depression without psychotic features (HCC) 05/19/2016  . Suicidal ideation 05/19/2016  . HTN (hypertension) 05/19/2016  . GERD (gastroesophageal reflux disease) 05/19/2016  . Alcohol use disorder, mild, abuse 05/19/2016  . Chronic left shoulder pain 02/15/2016  . Neck pain, bilateral 02/15/2016  . Primary osteoarthritis of right knee 01/31/2016    Past Surgical History:  Procedure Laterality Date  . BACK SURGERY  11/209/18 and 10/08/17   x 2  . COLONOSCOPY WITH PROPOFOL N/A 01/31/2018   Procedure: COLONOSCOPY WITH PROPOFOL;  Surgeon: Scot JunElliott, Robert T, MD;  [facility] ENDOSCOPY;  Service: Endoscopy;  Laterality: N/A;  . ESOPHAGOGASTRODUODENOSCOPY (EGD) WITH PROPOFOL N/A 01/31/2018   Procedure: ESOPHAGOGASTRODUODENOSCOPY (EGD) WITH PROPOFOL;  Surgeon: Lynnae PrudeElliott, Robert  T, MD;  Location: [facility] ENDOSCOPY;  Service: Endoscopy;  Laterality: N/A;  . LEG AMPUTATION BELOW KNEE     left  . SHOULDER  SURGERY Left     Prior to Admission medications   Medication Sig Start Date End Date Taking? Authorizing Provider  B-D UF III MINI PEN NEEDLES 31G X 5 MM MISC  05/04/17   [provider]  ciprofloxacin (CIPRO) 500 MG tablet Take 1 tablet (500 mg total) by mouth 2 (two) times daily. 10/07/18   Sondra ComeSninsky, Brian C, MD  cloNIDine (CATAPRES) 0.2 MG tablet  04/26/17   [provider]  dapagliflozin propanediol (FARXIGA) 10 MG TABS tablet Take by mouth. 08/26/18 08/26/19  [provider]  glipiZIDE (GLUCOTROL XL) 10 MG 24 hr tablet  07/12/17   [provider]  hydrochlorothiazide (HYDRODIURIL) 25 MG tablet take 1 tablet by mouth once daily for BLOOD PRESSURE 05/04/17   [provider]  ibuprofen (ADVIL,MOTRIN) 600 MG tablet Take 1 tablet (600 mg total) every 8 (eight) hours as needed by mouth. 08/26/17   Merrily Brittleifenbark, Neil, MD  lisinopril (PRINIVIL,ZESTRIL) 40 MG tablet Take 1 tablet (40 mg total) by mouth daily. 05/20/16   Pucilowska, Braulio ConteJolanta B, MD  Multiple Vitamin (MULTIVITAMIN) tablet Take 1 tablet by mouth daily.    [provider]  omeprazole (PRILOSEC) 10 MG capsule Take 10 mg by mouth daily.    [provider]  Dola ArgyleNETOUCH DELICA LANCETS FINE MISC USE TO TEST three times a day 05/29/17   [provider]  [facility] [address] VERIO test strip  06/23/17   [provider]  oxybutynin (DITROPAN) 5 MG tablet TAKE 1 TABLET(5 MG) BY MOUTH THREE TIMES DAILY 09/07/18   Marvel PlanMcGowan, Carollee HerterShannon A, PA-C  sitaGLIPtin (JANUVIA) 100 MG tablet Take 100 mg by mouth. 04/06/18   [provider]  sulfamethoxazole-trimethoprim (BACTRIM DS,SEPTRA DS) 800-160 MG tablet Take 1 tablet by mouth every 12 (twelve) hours. 08/26/18   Vanna ScotlandBrandon, Ashley, MD  sulfamethoxazole-trimethoprim (BACTRIM DS,SEPTRA DS) 800-160 MG tablet Take 1 tablet by mouth every 12 (twelve) hours. 09/28/18   Michiel CowboyMcGowan, Shannon A, PA-C    Allergies  Allergen Reactions  . Bactrim  [Sulfamethoxazole-Trimethoprim] Itching and Other (See Comments)    Causes blisters  . Myrbetriq [Mirabegron] Other (See Comments)    Dizzy, headache  . Benadryl [Diphenhydramine] Anxiety and Other (See Comments)    Causes "jerking"  . Diphenhydramine Hcl Anxiety and Other (See Comments)    Causes "jerking"  . Tamsulosin Rash    No family history on file.  Social History Social History   Tobacco Use  . Smoking status: Former Smoker    Years: 11.00  . Smokeless tobacco: Never Used  Substance Use Topics  . Alcohol use: No  . Drug use: No    Review of Systems Constitutional: Negative for fever. Cardiovascular: Positive for chest pain since yesterday Respiratory: Mild shortness of breath yesterday, none currently. Gastrointestinal: Negative for abdominal pain, vomiting  Genitourinary: Negative for urinary compaints Musculoskeletal: Negative for musculoskeletal complaints Skin: Negative for skin complaints  Neurological: Negative for headache All other ROS negative  ____________________________________________   PHYSICAL EXAM:  VITAL SIGNS: ED Triage Vitals  Enc Vitals Group     BP 10/12/18 1532 140/85     Pulse Rate 10/12/18 1532 97     Resp 10/12/18 1532 14     Temp 10/12/18 1532 98.1 F (36.7 C)     Temp Source 10/12/18 1532 Oral     SpO2 10/12/18 1532 96 %  Weight 10/12/18 1532 280 lb (127 kg)     Height 10/12/18 1532 5\' 8"  (1.727 m)     Head Circumference --      Peak Flow --      Pain Score 10/12/18 1535 7     Pain Loc --      Pain Edu? --      Excl. in GC? --    Constitutional: Alert and oriented. Well appearing and in no distress. Eyes: Normal exam ENT   Head: Normocephalic and atraumatic   Mouth/Throat: Mucous membranes are moist. Cardiovascular: Normal rate, regular rhythm. No murmur Respiratory: Normal respiratory effort without tachypnea nor retractions. Breath sounds are clear Gastrointestinal: Soft and nontender. No distention.   Musculoskeletal: Nontender with normal range of motion in all extremities. No lower extremity tenderness or edema.  Status post left BKA. Neurologic:  Normal speech and language. No gross focal neurologic deficits  Skin:  Skin is warm, dry and intact.  Psychiatric: Mood and affect are normal.   ____________________________________________    EKG  EKG viewed and interpreted by myself shows a normal sinus rhythm at 94 bpm with a narrow QRS, normal axis, normal intervals, no ST changes.  Normal EKG.  ____________________________________________    RADIOLOGY  Chest x-ray is negative  ____________________________________________   INITIAL IMPRESSION / ASSESSMENT AND PLAN / ED COURSE  Pertinent labs & imaging results that were available during my care of the patient were reviewed by me and considered in my medical decision making (see chart for details).  Patient presents to the emergency department for chest pain abdominal pain shortness of breath yesterday.  States a history of panic attacks and has had similar symptoms in the past but since the symptoms continued today he came to the emergency department for evaluation.  Overall the patient appears very well with a normal physical exam.  States very minimal chest discomfort currently.  Symptoms seem likely could be consistent with anxiety as the patient has a strong history of the same with similar symptoms in the past.  Reassuringly patient's work-up today has been largely normal including 2- troponins normal EKG and chest x-ray.  We will dose a dose of oral Ativan in the emergency department and monitor for improvement.  As long the patient is feeling better anticipate likely discharge home with PCP follow-up.  I discussed this with the patient and he is agreeable to this plan of care.  Patient states he feels much better, denies any symptoms at this time.  We will discharge with a short course of Ativan.  Patient will follow-up with  his psychiatrist.    ____________________________________________   FINAL CLINICAL IMPRESSION(S) / ED DIAGNOSES  Chest pain Anxiety   Minna Antis, MD 10/12/18 2154

## 2018-10-15 ENCOUNTER — Other Ambulatory Visit: Payer: Self-pay | Admitting: Urology

## 2018-10-17 ENCOUNTER — Other Ambulatory Visit: Payer: Self-pay | Admitting: Urology

## 2018-10-18 ENCOUNTER — Ambulatory Visit: Payer: Medicare Other | Admitting: Urology

## 2018-10-18 VITALS — BP 137/83 | HR 87

## 2018-10-18 DIAGNOSIS — R102 Pelvic and perineal pain: Secondary | ICD-10-CM

## 2018-10-18 LAB — URINALYSIS, COMPLETE
Bilirubin, UA: NEGATIVE
Glucose, UA: NEGATIVE
Ketones, UA: NEGATIVE
Leukocytes, UA: NEGATIVE
NITRITE UA: NEGATIVE
Protein, UA: NEGATIVE
RBC, UA: NEGATIVE
Specific Gravity, UA: 1.025 (ref 1.005–1.030)
Urobilinogen, Ur: 0.2 mg/dL (ref 0.2–1.0)
pH, UA: 5.5 (ref 5.0–7.5)

## 2018-10-18 LAB — MICROSCOPIC EXAMINATION
Epithelial Cells (non renal): NONE SEEN /hpf (ref 0–10)
WBC, UA: NONE SEEN /hpf (ref 0–5)

## 2018-10-18 NOTE — Progress Notes (Signed)
Per [facility] Patient was not seen in office today. He has had a Cysto done with in the last year. He is to Follow up as needed with [facility].

## 2018-11-02 ENCOUNTER — Encounter: Payer: Self-pay | Admitting: Urology

## 2018-11-02 ENCOUNTER — Ambulatory Visit (INDEPENDENT_AMBULATORY_CARE_PROVIDER_SITE_OTHER): Payer: Medicare Other | Admitting: Urology

## 2018-11-02 VITALS — BP 137/89 | HR 93 | Ht 68.0 in | Wt 276.0 lb

## 2018-11-02 DIAGNOSIS — R102 Pelvic and perineal pain: Secondary | ICD-10-CM | POA: Diagnosis not present

## 2018-11-02 DIAGNOSIS — F339 Major depressive disorder, recurrent, unspecified: Secondary | ICD-10-CM

## 2018-11-02 DIAGNOSIS — E1165 Type 2 diabetes mellitus with hyperglycemia: Secondary | ICD-10-CM

## 2018-11-02 DIAGNOSIS — R350 Frequency of micturition: Secondary | ICD-10-CM

## 2018-11-02 LAB — URINALYSIS, COMPLETE
Bilirubin, UA: NEGATIVE
Leukocytes, UA: NEGATIVE
Nitrite, UA: NEGATIVE
Protein, UA: NEGATIVE
RBC, UA: NEGATIVE
Specific Gravity, UA: 1.01 (ref 1.005–1.030)
Urobilinogen, Ur: 0.2 mg/dL (ref 0.2–1.0)
pH, UA: 5.5 (ref 5.0–7.5)

## 2018-11-02 LAB — BLADDER SCAN AMB NON-IMAGING: Scan Result: 28

## 2018-11-02 NOTE — Progress Notes (Signed)
11/02/2018  10:15 AM   Todd ManningAngel Camper 06/01/1966 [id number]  Referring provider: Preston Fleetingevelo, Adrian Mancheno, MD [address] [place], [place] [id number]  Chief Complaint  Patient presents with  . Other    HPI: Todd [name] is a 53 y.o. male Hispanic with BPH, LUTS, and a recent history of UTIs who presents today for evaluation of pressure and pain & severe frequency.    Background History He c/o of frequency, nocturia and incontinence. He was previously on Mrybetriq which gave him dizinness and headaches. He switched to Oxybutynin 5 mg 3x daily.  He states he has noticed some improvement with the oxybutynin.  He has noticed a dry mouth with this medication.   He had a UA culture from 08/23/2018 positive for Citrobacter koseri which was treated with Bactrim.  He reported incontinence, urgency which has been going on for 24 years .He also reported suprapubic pressure and intermittent burning symptoms. Pt reported he was told that he had a "thinning of the bladder" after [place] 53.  He states he was taking to the OR and had what sounds like a cystoscopy with fulguration and hydro distention.  He also described a test done in the office where they put in a solution and it burned which sounds like a potassium sensitivity test.  He also described a solution that was placed in his bladder several times which sounds like rescues solutions.  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  He has a FHx of lung, throat and intestinal cancer.   He also has reported an itching sensation on the inside shaft of his penis where he feels like he is about to urinate but does not. He does not smoke or drink. He has DM.   On 09/23/2018, his I PSS was 13/3 and UA showed many bacteria; culture found Citrobacter koseri, he was originally prescribed Bactrim but had an allergic reaction & was switched to Cipro. His PVR was 14 mL. Most recent HgbA1c was 7.8% in 06/2018.  He still  continues to eat a lot of rice as he is [place] and this is a mainstay of his diet.  He stated that he was drinking a lot of water.  On 11/02/2018, on the symptom check list he complained of frequent urination, dysuria, nocturia, leakage, and having to strain to urinate.  UA was negative, aside from GLU 3+.  Patient admits suprapubic pain.  Numerous issues have interfered with ability to exercise to lose weight; he has been referred to physical therapy for the shoulder only.  He admits to being depressed over all of this, and PTSD.  He does not feel the motivation to make appointments and has been suffering from anxiety and panic attacks when driving any significant distance; he would prefer to see a psychiatrist here and does not feel his current one is a good match and is tired of taking all the medications & the pain.  Patient is very happy to hear that he can get physical therapy for his pelvic issues and quite interested.  Patient is interested seeing a nutritionist as well.  Appointments have been made with local providers.  PMH: Past Medical History:  Diagnosis Date  . Anxiety   . 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 Chronic left shoulder pain 02/15/2016  . Depression   . Diabetes mellitus without complication (HCC)   . Dislocation of right thumb 06/05/2016  . Diverticulitis   . Gastroparesis  06/08/2017  . GERD (gastroesophageal reflux disease)   . HTN (hypertension) 05/19/2016  . Hypertension   . MDD (major depressive disorder), recurrent episode (HCC) 05/28/2016  . Neck pain, bilateral 02/15/2016  . Primary osteoarthritis of right knee 01/31/2016   and fingers  . PTSD (post-traumatic stress disorder) 06/05/2016  . Severe recurrent major depression without psychotic features (HCC) 05/19/2016  . Sleep apnea    unable to tolerate CPAP  . Substance induced mood disorder (HCC) 05/19/2016  . Suicidal ideation 05/19/2016  . Type 2 diabetes mellitus with complication, without  long-term current use of insulin (HCC) 06/08/2017  . Uncontrolled type 2 diabetes mellitus with hyperglycemia, without long-term current use of insulin (HCC) 03/12/2017    Surgical History: Past Surgical History:  Procedure Laterality Date  . BACK SURGERY  11/209/18 and 10/08/17   x 2  . COLONOSCOPY WITH PROPOFOL N/A 01/31/2018   Procedure: COLONOSCOPY WITH PROPOFOL;  Surgeon: Scot Jun, MD;  Location: [facility] ENDOSCOPY;  Service: Endoscopy;  Laterality: N/A;  . ESOPHAGOGASTRODUODENOSCOPY (EGD) WITH PROPOFOL N/A 01/31/2018   Procedure: ESOPHAGOGASTRODUODENOSCOPY (EGD) WITH PROPOFOL;  Surgeon: Scot Jun, MD;  Location: [facility] ENDOSCOPY;  Service: Endoscopy;  Laterality: N/A;  . LEG AMPUTATION BELOW KNEE     left  . SHOULDER SURGERY Left     Home Medications:  Allergies as of 11/02/2018      Reactions   Bactrim [sulfamethoxazole-trimethoprim] Itching, Other (See Comments)   Causes blisters   Myrbetriq [mirabegron] Other (See Comments)   Dizzy, headache   Benadryl [diphenhydramine] Anxiety, Other (See Comments)   Causes "jerking"   Diphenhydramine Hcl Anxiety, Other (See Comments)   Causes "jerking"   Tamsulosin Rash      Medication List       Accurate as of November 02, 2018 11:59 PM. Always use your most recent med list.        B-D UF III MINI PEN NEEDLES 31G X 5 MM Misc Generic drug:  Insulin Pen Needle   cloNIDine 0.2 MG tablet Commonly known as:  CATAPRES Take 0.2 mg by mouth daily.   dapagliflozin propanediol 10 MG Tabs tablet Commonly known as:  FARXIGA Take 10 mg by mouth daily.   glipiZIDE 10 MG 24 hr tablet Commonly known as:  GLUCOTROL XL Take 10 mg by mouth daily with breakfast.   hydrochlorothiazide 25 MG tablet Commonly known as:  HYDRODIURIL take 1 tablet by mouth once daily for BLOOD PRESSURE   ibuprofen 600 MG tablet Commonly known as:  ADVIL,MOTRIN Take 1 tablet (600 mg total) every 8 (eight) hours as needed by mouth.   JANUVIA 100 MG  tablet Generic drug:  sitaGLIPtin Take 100 mg by mouth daily.   lisinopril 40 MG tablet Commonly known as:  PRINIVIL,ZESTRIL Take 1 tablet (40 mg total) by mouth daily.   LORazepam 0.5 MG tablet Commonly known as:  ATIVAN Take 1 tablet (0.5 mg total) by mouth every 8 (eight) hours as needed for anxiety.   multivitamin tablet Take 1 tablet by mouth daily.   omeprazole 10 MG capsule Commonly known as:  PRILOSEC Take 10 mg by mouth daily.   ONETOUCH DELICA LANCETS FINE Misc USE TO TEST three times a day   ONETOUCH VERIO test strip Generic drug:  glucose blood   oxybutynin 5 MG tablet Commonly known as:  DITROPAN TAKE 1 TABLET(5 MG) BY MOUTH THREE TIMES DAILY       Allergies:  Allergies  Allergen Reactions  . Bactrim [Sulfamethoxazole-Trimethoprim] Itching and Other (  See Comments)    Causes blisters  . Myrbetriq [Mirabegron] Other (See Comments)    Dizzy, headache  . Benadryl [Diphenhydramine] Anxiety and Other (See Comments)    Causes "jerking"  . Diphenhydramine Hcl Anxiety and Other (See Comments)    Causes "jerking"  . Tamsulosin Rash    Family History: Family History  Problem Relation Age of Onset  . Diabetes Mother     Social History:  reports that he quit smoking about 30 years ago. His smoking use included cigarettes. He has a 11.00 pack-year smoking history. He has never used smokeless tobacco. He reports that he does not drink alcohol or use drugs.  ROS: UROLOGY Frequent Urination?: Yes Hard to postpone urination?: No Burning/pain with urination?: No Get up at night to urinate?: Yes Leakage of urine?: No Urine stream starts and stops?: No Trouble starting stream?: No Do you have to strain to urinate?: No Blood in urine?: No Urinary tract infection?: No Sexually transmitted disease?: No Injury to kidneys or bladder?: No Painful intercourse?: No Weak stream?: No Erection problems?: No Penile pain?: No  Gastrointestinal Nausea?:  No Vomiting?: No Indigestion/heartburn?: No Diarrhea?: No Constipation?: No  Constitutional Fever: No Night sweats?: No Weight loss?: No Fatigue?: No  Skin Skin rash/lesions?: No Itching?: No  Eyes Blurred vision?: No Double vision?: No  Ears/Nose/Throat Sore throat?: No Sinus problems?: No  Hematologic/Lymphatic Swollen glands?: No Easy bruising?: No  Cardiovascular Leg swelling?: No Chest pain?: Yes  Respiratory Cough?: No Shortness of breath?: No  Endocrine Excessive thirst?: No  Musculoskeletal Back pain?: Yes Joint pain?: Yes  Neurological Headaches?: Yes Dizziness?: No  Psychologic Depression?: Yes Anxiety?: Yes  Physical Exam: BP 137/89   Pulse 93   Ht 5\' 8"  (1.727 m)   Wt 276 lb (125.2 kg)   BMI 41.97 kg/m   Constitutional:  Well nourished. Alert and oriented, No acute distress. Cardiovascular: No clubbing, cyanosis, or edema. Respiratory: Normal respiratory effort, no increased work of breathing. Skin: No rashes, bruises or suspicious lesions. Neurologic: Grossly intact, no focal deficits, moving all 4 extremities. Psychiatric: Anxious  Laboratory Data: Component     Latest Ref Rng & Units 08/23/2018  Prostate Specific Ag, Serum     0.0 - 4.0 ng/mL 2.4   Urinalysis His UA is largely negative, with GLU 3+. I have reviewed the labs.  Pertinent Imaging: Component     Latest Ref Rng & Units 08/23/2018 09/23/2018  Scan Result      16mL 14   Assessment & Plan:    1. BPH with urinary frequency - Symptoms are primarily irritated with urgency and frequency - Adequate bladder emptying today; PVR is 14 mL - Likely multifactorial including diabetes, behavioral possibly with obstruction as contributing factor (BPH)  - Advised that blood sugars should be under control - talked about limited rice intake - Discontinued Mirabegron 50 gm to treat overactivity symptoms; could not tolerate medicine due to headaches and dizziness  -  Switched to Oxybutynin 5 mg 3x a day - which has improved his symptoms somewhat  2. Suprapubic pain/dysuria - Referred patient for physical therapy - UA sent for culture to rule out indolent infection   3. PSA Screening  - PSA 2.4 on 08/23/2018  4. DM - referred to [facility] nutritionist services  5. Anxiety/depression - refer to local psychiatrist   Return for after PT  .  Michiel Cowboy, PA-C  [facility] [address] [place], [place] [address] [phone]  I,  Duanne Moron, am acting as a Neurosurgeon for [employer], PA-C.   I have reviewed the above documentation for accuracy and completeness, and I agree with the above.    Michiel Cowboy, PA-C  I spent 25 minutes with this patient in a face to face visit of which greater than 50% was spent in counseling and coordination of care with the patient listening to his concerns and obstacles in getting the help he needs.  I have made referrals to local resources in an effort to help him.

## 2018-11-03 ENCOUNTER — Encounter: Payer: Medicare Other | Attending: Family Medicine | Admitting: *Deleted

## 2018-11-03 ENCOUNTER — Encounter: Payer: Self-pay | Admitting: *Deleted

## 2018-11-03 VITALS — BP 116/80 | Ht 68.0 in | Wt 270.4 lb

## 2018-11-03 DIAGNOSIS — E118 Type 2 diabetes mellitus with unspecified complications: Secondary | ICD-10-CM | POA: Diagnosis present

## 2018-11-03 DIAGNOSIS — E1165 Type 2 diabetes mellitus with hyperglycemia: Secondary | ICD-10-CM

## 2018-11-03 DIAGNOSIS — E669 Obesity, unspecified: Secondary | ICD-10-CM | POA: Insufficient documentation

## 2018-11-03 DIAGNOSIS — Z79899 Other long term (current) drug therapy: Secondary | ICD-10-CM | POA: Diagnosis not present

## 2018-11-03 DIAGNOSIS — Z7984 Long term (current) use of oral hypoglycemic drugs: Secondary | ICD-10-CM | POA: Insufficient documentation

## 2018-11-03 DIAGNOSIS — Z6841 Body Mass Index (BMI) 40.0 and over, adult: Secondary | ICD-10-CM | POA: Insufficient documentation

## 2018-11-03 NOTE — Progress Notes (Signed)
Diabetes Self-Management Education  Visit Type: First/Initial  Appt. Start Time: 1340 Appt. End Time: 1455  11/03/2018  [name], identified by name and date of birth, is a 53 y.o. male with a diagnosis of Diabetes: Type 2.   ASSESSMENT  Blood pressure 116/80, height 5\' 8"  (1.727 m), weight 270 lb 6.4 oz (122.7 kg). Body mass index is 41.11 kg/m.  Diabetes Self-Management Education - 11/03/18 1513      Visit Information   Visit Type  First/Initial      Initial Visit   Diabetes Type  Type 2    Are you currently following a meal plan?  No    Are you taking your medications as prescribed?  Yes    Date Diagnosed  2011      Health Coping   How would you rate your overall health?  Poor      Psychosocial Assessment   Patient Belief/Attitude about Diabetes  Defeat/Burnout    Self-care barriers  Unsteady gait/risk for falls    Self-management support  Doctor's office;Family    Patient Concerns  Nutrition/Meal planning;Medication;Monitoring;Healthy Lifestyle;Problem Solving;Glycemic Control;Weight Control    Special Needs  None    Preferred Learning Style  Auditory;Hands on    Learning Readiness  Change in progress    How often do you need to have someone help you when you read instructions, pamphlets, or other written materials from your doctor or pharmacy?  2 - Rarely    What is the last grade level you completed in school?  10th      Pre-Education Assessment   Patient understands the diabetes disease and treatment process.  Needs Instruction    Patient understands incorporating nutritional management into lifestyle.  Needs Instruction    Patient undertands incorporating physical activity into lifestyle.  Needs Review    Patient understands using medications safely.  Needs Instruction    Patient understands monitoring blood glucose, interpreting and using results  Needs Review    Patient understands prevention, detection, and treatment of acute complications.  Needs  Instruction    Patient understands prevention, detection, and treatment of chronic complications.  Needs Review    Patient understands how to develop strategies to address psychosocial issues.  Needs Instruction    Patient understands how to develop strategies to promote health/change behavior.  Needs Instruction      Complications   Last HgB A1C per patient/outside source  8 %   10/24/2018   How often do you check your blood sugar?  1-2 times/day    Fasting Blood glucose range (mg/dL)  161-096;>045;[id number];>200;130-179   pt reports FBG's 150, 179 and in the 200's mg/dL.    Have you had a dilated eye exam in the past 12 months?  Yes    Have you had a dental exam in the past 12 months?  Yes    Are you checking your feet?  Yes    How many days per week are you checking your feet?  7      Dietary Intake   Breakfast  egg or oatmeal    Lunch  sandwich with lettuce, tomato and mayo; soup    Dinner  fish, vegetables, rice, beans, peas, corn, green beans, lettuce tomato, cuccumbers, mushrooms, broccoli, cauliflower, asparagus    Snack (evening)  cheese and crackers, peanut butter and jelly sandwich    Beverage(s)  water, coffee      Exercise   Exercise Type  ADL's      Patient Education  Previous Diabetes Education  Yes (please comment)   saw dietitian in [place]   Disease state   Factors that contribute to the development of diabetes    Nutrition management   Role of diet in the treatment of diabetes and the relationship between the three main macronutrients and blood glucose level;Food label reading, portion sizes and measuring food.;Reviewed blood glucose goals for pre and post meals and how to evaluate the patients' food intake on their blood glucose level.    Physical activity and exercise   Role of exercise on diabetes management, blood pressure control and cardiac health.    Medications  Reviewed patients medication for diabetes, action, purpose, timing of dose and side effects.    Monitoring   Purpose and frequency of SMBG.;Taught/discussed recording of test results and interpretation of SMBG.;Identified appropriate SMBG and/or A1C goals.    Chronic complications  Relationship between chronic complications and blood glucose control    Psychosocial adjustment  Role of stress on diabetes;Identified and addressed patients feelings and concerns about diabetes      Individualized Goals (developed by patient)   Reducing Risk  Improve blood sugars Decrease medications Prevent diabetes complications Lose weight Lead a healthier lifestyle     Outcomes   Expected Outcomes  Demonstrated interest in learning. Expect positive outcomes       Individualized Plan for Diabetes Self-Management Training:   Learning Objective:  Patient will have a greater understanding of diabetes self-management. Patient education plan is to attend individual and/or group sessions per assessed needs and concerns.   Plan:   Patient Instructions  Check blood sugars 2 x day before breakfast and 2 hrs after supper every day Bring blood sugar records to the next class Exercise: Begin swimming exercises Eat 3 meals day, 1-2 snacks a day Space meals 4-6 hours apart Include 1 serving of protein with meals and if needed with snacks  Expected Outcomes:  Demonstrated interest in learning. Expect positive outcomes  Education material provided:  General Meal Planning Guidelines Simple Meal Plan  If problems or questions, patient to contact team via:  Sharion Settler, RN, CCM, CDE [phone]  Future DSME appointment:  November 24, 2018 for Diabetes Class 1

## 2018-11-03 NOTE — Patient Instructions (Signed)
Check blood sugars 2 x day before breakfast and 2 hrs after supper every day Bring blood sugar records to the next class  Exercise: Begin swimming exercises  Eat 3 meals day, 1-2 snacks a day Space meals 4-6 hours apart Include 1 serving of protein with meals and if needed with snacks  Return for classes on:

## 2018-11-04 ENCOUNTER — Telehealth: Payer: Self-pay | Admitting: Urology

## 2018-11-04 NOTE — Telephone Encounter (Signed)
Pt called and wanted his results from his labs that were drawn on 11/02/18 and also states that he is still having "terrible pain"

## 2018-11-04 NOTE — Telephone Encounter (Signed)
Patient notified the result is not completed yet.

## 2018-11-05 ENCOUNTER — Emergency Department: Payer: Medicare Other

## 2018-11-05 ENCOUNTER — Other Ambulatory Visit: Payer: Self-pay

## 2018-11-05 ENCOUNTER — Emergency Department
Admission: EM | Admit: 2018-11-05 | Discharge: 2018-11-05 | Disposition: A | Discharge status: Home / Self Care | Payer: Medicare Other | Attending: Emergency Medicine | Admitting: Emergency Medicine

## 2018-11-05 DIAGNOSIS — I10 Essential (primary) hypertension: Secondary | ICD-10-CM | POA: Diagnosis not present

## 2018-11-05 DIAGNOSIS — E119 Type 2 diabetes mellitus without complications: Secondary | ICD-10-CM | POA: Diagnosis not present

## 2018-11-05 DIAGNOSIS — Z79899 Other long term (current) drug therapy: Secondary | ICD-10-CM | POA: Diagnosis not present

## 2018-11-05 DIAGNOSIS — K5732 Diverticulitis of large intestine without perforation or abscess without bleeding: Secondary | ICD-10-CM | POA: Insufficient documentation

## 2018-11-05 DIAGNOSIS — Z87891 Personal history of nicotine dependence: Secondary | ICD-10-CM | POA: Diagnosis not present

## 2018-11-05 DIAGNOSIS — Z7984 Long term (current) use of oral hypoglycemic drugs: Secondary | ICD-10-CM | POA: Diagnosis not present

## 2018-11-05 DIAGNOSIS — R1030 Lower abdominal pain, unspecified: Secondary | ICD-10-CM | POA: Diagnosis present

## 2018-11-05 LAB — URINALYSIS, COMPLETE (UACMP) WITH MICROSCOPIC
Bacteria, UA: NONE SEEN
Bilirubin Urine: NEGATIVE
Glucose, UA: >=500 mg/dL — AB
Hgb urine dipstick: NEGATIVE
Ketones, ur: 5 mg/dL — AB
Leukocytes, UA: NEGATIVE
Nitrite: NEGATIVE
PH: 5 (ref 5.0–8.0)
Protein, ur: 30 mg/dL — AB
Specific Gravity, Urine: 1.039 — ABNORMAL HIGH (ref 1.005–1.030)
Squamous Epithelial / HPF: NONE SEEN (ref 0–5)

## 2018-11-05 LAB — COMPREHENSIVE METABOLIC PANEL
ALT: 92 U/L — ABNORMAL HIGH (ref 0–44)
ANION GAP: 10 (ref 5–15)
AST: 73 U/L — ABNORMAL HIGH (ref 15–41)
Albumin: 4.8 g/dL (ref 3.5–5.0)
Alkaline Phosphatase: 69 U/L (ref 38–126)
BUN: 21 mg/dL — ABNORMAL HIGH (ref 6–20)
CO2: 25 mmol/L (ref 22–32)
Calcium: 9.5 mg/dL (ref 8.9–10.3)
Chloride: 103 mmol/L (ref 98–111)
Creatinine, Ser: 1.15 mg/dL (ref 0.61–1.24)
GFR calc Af Amer: >60 mL/min (ref >60)
GFR calc non Af Amer: >60 mL/min (ref >60)
Glucose, Bld: 185 mg/dL — ABNORMAL HIGH (ref 70–99)
Potassium: 3.3 mmol/L — ABNORMAL LOW (ref 3.5–5.1)
Sodium: 138 mmol/L (ref 135–145)
Total Bilirubin: 1.4 mg/dL — ABNORMAL HIGH (ref 0.3–1.2)
Total Protein: 8.4 g/dL — ABNORMAL HIGH (ref 6.5–8.1)

## 2018-11-05 LAB — CBC
HCT: 49.7 % (ref 39.0–52.0)
Hemoglobin: 17 g/dL (ref 13.0–17.0)
MCH: 27.7 pg (ref 26.0–34.0)
MCHC: 34.2 g/dL (ref 30.0–36.0)
MCV: 81.1 fL (ref 80.0–100.0)
Platelets: 254 10*3/uL (ref 150–400)
RBC: 6.13 MIL/uL — ABNORMAL HIGH (ref 4.22–5.81)
RDW: 12.9 % (ref 11.5–15.5)
WBC: 9.7 10*3/uL (ref 4.0–10.5)
nRBC: 0 % (ref 0.0–0.2)

## 2018-11-05 LAB — LIPASE, BLOOD: Lipase: 39 U/L (ref 11–51)

## 2018-11-05 LAB — CULTURE, URINE COMPREHENSIVE

## 2018-11-05 MED ORDER — SODIUM CHLORIDE 0.9% FLUSH: 3.0000 mL | Freq: Once | INTRAVENOUS | Status: DC

## 2018-11-05 MED ORDER — ONDANSETRON 4 MG PO TBDP: 4.0000 mg | ORAL_TABLET | Freq: Three times a day (TID) | ORAL | 0 refills | Status: DC | PRN

## 2018-11-05 MED ORDER — NAPROXEN 500 MG PO TABS: 500.0000 mg | ORAL_TABLET | Freq: Two times a day (BID) | ORAL | 0 refills | Status: DC

## 2018-11-05 MED ORDER — CIPROFLOXACIN HCL 500 MG PO TABS: 500.0000 mg | ORAL_TABLET | Freq: Two times a day (BID) | ORAL | 0 refills | Status: DC

## 2018-11-05 MED ORDER — METRONIDAZOLE 500 MG PO TABS: 500.0000 mg | ORAL_TABLET | Freq: Three times a day (TID) | ORAL | 0 refills | Status: DC

## 2018-11-05 NOTE — ED Notes (Signed)
Bladder scan revealed 72 ml present in the bladder.

## 2018-11-05 NOTE — ED Notes (Signed)
No peripheral IV placed this visit.   Discharge instructions reviewed with patient. Questions fielded by this RN. Patient verbalizes understanding of instructions. Patient discharged home in stable condition per stafford. No acute distress noted at time of discharge.   

## 2018-11-05 NOTE — Discharge Instructions (Signed)
Your CT scan shows uncomplicated diverticulitis of your large intestine on the left side of your abdomen.  Take Cipro and Flagyl antibiotics to help control this.  Naproxen and Zofran can help with the symptoms as well.  Follow-up with general surgery in a week to reassess your symptoms and determine if further treatment is needed for your diverticulitis.

## 2018-11-05 NOTE — ED Provider Notes (Signed)
[facility] Emergency Department Provider Note  ____________________________________________  Time seen: Approximately 7:12 PM  I have reviewed the triage vital signs and the nursing notes.   HISTORY  Chief Complaint Urinary Frequency and Abdominal Pain    HPI Todd [name] is a 53 y.o. male with a history of diabetes hypertension diverticulitis who comes the ED complaining of diffuse lower abdominal pain and urinary frequency for the past 6 days.  No aggravating or alleviating factors.  Nonradiating.  Moderate intensity, aching.  No vomiting diarrhea or constipation.  No fevers or chills.      Past Medical History:  Diagnosis Date  . Anxiety   . 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 Chronic left shoulder pain 02/15/2016  . Depression   . Diabetes mellitus without complication (HCC)   . Dislocation of right thumb 06/05/2016  . Diverticulitis   . Gastroparesis 06/08/2017  . GERD (gastroesophageal reflux disease)   . HTN (hypertension) 05/19/2016  . Hypertension   . MDD (major depressive disorder), recurrent episode (HCC) 05/28/2016  . Neck pain, bilateral 02/15/2016  . Primary osteoarthritis of right knee 01/31/2016   and fingers  . PTSD (post-traumatic stress disorder) 06/05/2016  . Severe recurrent major depression without psychotic features (HCC) 05/19/2016  . Sleep apnea    unable to tolerate CPAP  . Substance induced mood disorder (HCC) 05/19/2016  . Suicidal ideation 05/19/2016  . Type 2 diabetes mellitus with complication, without long-term current use of insulin (HCC) 06/08/2017  . Uncontrolled type 2 diabetes mellitus with hyperglycemia, without long-term current use of insulin (HCC) 03/12/2017     Patient Active Problem List   Diagnosis Date Noted  . Closed fracture of distal end of radius 05/31/2018  . Radiculopathy of lumbosacral region 09/30/2017  . Spondylolisthesis at L5-S1 level 09/30/2017  . Obesity 09/06/2017  . Hepatic  steatosis 08/26/2017  . Non-intractable vomiting with nausea 08/26/2017  . Upper abdominal pain 08/26/2017  . 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 Gastroparesis 06/08/2017  . History of noncompliance with medical treatment 06/08/2017  . Type 2 diabetes mellitus with complication, without long-term current use of insulin (HCC) 06/08/2017  . Uncontrolled type 2 diabetes mellitus with hyperglycemia, without long-term current use of insulin (HCC) 03/12/2017  . 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 Dislocation of right thumb 06/05/2016  . PTSD (post-traumatic stress disorder) 06/05/2016  . MDD (major depressive disorder), recurrent episode (HCC) 05/28/2016  . Substance induced mood disorder (HCC) 05/19/2016  . Severe recurrent major depression without psychotic features (HCC) 05/19/2016  . Suicidal ideation 05/19/2016  . HTN (hypertension) 05/19/2016  . GERD (gastroesophageal reflux disease) 05/19/2016  . Alcohol use disorder, mild, abuse 05/19/2016  . Chronic left shoulder pain 02/15/2016  . Neck pain, bilateral 02/15/2016  . Primary osteoarthritis of right knee 01/31/2016     Past Surgical History:  Procedure Laterality Date  . BACK SURGERY  11/209/18 and 10/08/17   x 2  . COLONOSCOPY WITH PROPOFOL N/A 01/31/2018   Procedure: COLONOSCOPY WITH PROPOFOL;  Surgeon: Scot Jun, MD;  Location: [facility] ENDOSCOPY;  Service: Endoscopy;  Laterality: N/A;  . ESOPHAGOGASTRODUODENOSCOPY (EGD) WITH PROPOFOL N/A 01/31/2018   Procedure: ESOPHAGOGASTRODUODENOSCOPY (EGD) WITH PROPOFOL;  Surgeon: Scot Jun, MD;  Location: [facility] ENDOSCOPY;  Service: Endoscopy;  Laterality: N/A;  . LEG AMPUTATION BELOW KNEE     left  . SHOULDER SURGERY Left      Prior to Admission medications   Medication Sig Start Date End Date Taking? Authorizing Provider  atorvastatin (LIPITOR) 20 MG tablet Take 20 mg by mouth daily.    [provider]  B-D UF III MINI PEN NEEDLES 31G X 5 MM MISC  05/04/17    [provider]  ciprofloxacin (CIPRO) 500 MG tablet Take 1 tablet (500 mg total) by mouth 2 (two) times daily. 11/05/18   Sharman Cheek, MD  cloNIDine (CATAPRES) 0.2 MG tablet Take 0.2 mg by mouth daily.  04/26/17   [provider]  dapagliflozin propanediol (FARXIGA) 10 MG TABS tablet Take 10 mg by mouth daily.  08/26/18 08/26/19  [provider]  glipiZIDE (GLUCOTROL XL) 10 MG 24 hr tablet Take 10 mg by mouth daily with breakfast.  07/12/17   [provider]  hydrochlorothiazide (HYDRODIURIL) 25 MG tablet take 1 tablet by mouth once daily for BLOOD PRESSURE 05/04/17   [provider]  ibuprofen (ADVIL,MOTRIN) 600 MG tablet Take 1 tablet (600 mg total) every 8 (eight) hours as needed by mouth. 08/26/17   Merrily Brittle, MD  lisinopril (PRINIVIL,ZESTRIL) 40 MG tablet Take 1 tablet (40 mg total) by mouth daily. 05/20/16   Pucilowska, Jolanta B, MD  LORazepam (ATIVAN) 0.5 MG tablet Take 1 tablet (0.5 mg total) by mouth every 8 (eight) hours as needed for anxiety. 10/12/18 10/12/19  Minna Antis, MD  metFORMIN (GLUCOPHAGE) 500 MG tablet Take 1,500 mg by mouth daily with supper.    [provider]  metroNIDAZOLE (FLAGYL) 500 MG tablet Take 1 tablet (500 mg total) by mouth 3 (three) times daily. 11/05/18   Sharman Cheek, MD  Multiple Vitamin (MULTIVITAMIN) tablet Take 1 tablet by mouth daily.    [provider]  naproxen (NAPROSYN) 500 MG tablet Take 1 tablet (500 mg total) by mouth 2 (two) times daily with a meal. 11/05/18   Sharman Cheek, MD  omeprazole (PRILOSEC) 10 MG capsule Take 10 mg by mouth daily.    [provider]  ondansetron (ZOFRAN ODT) 4 MG disintegrating tablet Take 1 tablet (4 mg total) by mouth every 8 (eight) hours as needed for nausea or vomiting. 11/05/18   Sharman Cheek, MD  [facility] DELICA LANCETS FINE MISC USE TO TEST three times a day 05/29/17   [provider]  [facility] VERIO test strip   06/23/17   [provider]  oxybutynin (DITROPAN) 5 MG tablet TAKE 1 TABLET(5 MG) BY MOUTH THREE TIMES DAILY 10/17/18   Marvel Plan, Carollee Herter A, PA-C  sitaGLIPtin (JANUVIA) 100 MG tablet Take 100 mg by mouth daily.  04/06/18   [provider]     Allergies Bactrim [sulfamethoxazole-trimethoprim]; Myrbetriq [mirabegron]; Benadryl [diphenhydramine]; Diphenhydramine hcl; and Tamsulosin   Family History  Problem Relation Age of Onset  . Diabetes Mother     Social History Social History   Tobacco Use  . Smoking status: Former Smoker    Packs/day: 1.00    Years: 11.00    Pack years: 11.00    Types: Cigarettes    Last attempt to quit: 10/12/1988    Years since quitting: 30.0  . Smokeless tobacco: Never Used  Substance Use Topics  . Alcohol use: No  . Drug use: No    Review of Systems  Constitutional:   No fever or chills.  ENT:   No sore throat. No rhinorrhea. Cardiovascular:   No chest pain or syncope. Respiratory:   No dyspnea or cough. Gastrointestinal:   Positive as above for abdominal pain without vomiting and diarrhea.  Musculoskeletal:   Negative for focal pain or swelling All other  systems reviewed and are negative except as documented above in ROS and HPI.  ____________________________________________   PHYSICAL EXAM:  VITAL SIGNS: ED Triage Vitals  Enc Vitals Group     BP 11/05/18 1453 (!) 144/94     Pulse Rate 11/05/18 1453 (!) 103     Resp 11/05/18 1453 18     Temp 11/05/18 1453 98.4 F (36.9 C)     Temp Source 11/05/18 1453 Oral     SpO2 11/05/18 1453 96 %     Weight 11/05/18 1453 270 lb (122.5 kg)     Height 11/05/18 1453 5\' 8"  (1.727 m)     Head Circumference --      Peak Flow --      Pain Score 11/05/18 1504 7     Pain Loc --      Pain Edu? --      Excl. in GC? --     Vital signs reviewed, nursing assessments reviewed.   Constitutional:   Alert and oriented. Non-toxic appearance. Eyes:   Conjunctivae are normal. EOMI.  PERRL. ENT      Head:   Normocephalic and atraumatic.      Nose:   No congestion/rhinnorhea.       Mouth/Throat:   MMM, no pharyngeal erythema. No peritonsillar mass.       Neck:   No meningismus. Full ROM. Hematological/Lymphatic/Immunilogical:   No cervical lymphadenopathy. Cardiovascular:   RRR. Symmetric bilateral radial and DP pulses.  No murmurs. Cap refill less than 2 seconds. Respiratory:   Normal respiratory effort without tachypnea/retractions. Breath sounds are clear and equal bilaterally. No wheezes/rales/rhonchi. Gastrointestinal:   Soft with diffuse lower abdominal tenderness. Non distended. There is no CVA tenderness.  No rebound, rigidity, or guarding.  No hernias Bladder scan 0  Musculoskeletal:   Normal range of motion in all extremities. No joint effusions.  No lower extremity tenderness.  No edema. Neurologic:   Normal speech and language.  Motor grossly intact. No acute focal neurologic deficits are appreciated.  Skin:    Skin is warm, dry and intact. No rash noted.  No petechiae, purpura, or bullae.  ____________________________________________    LABS (pertinent positives/negatives) (all labs ordered are listed, but only abnormal results are displayed) Labs Reviewed  COMPREHENSIVE METABOLIC PANEL - Abnormal; Notable for the following components:      Result Value   Potassium 3.3 (*)    Glucose, Bld 185 (*)    BUN 21 (*)    Total Protein 8.4 (*)    AST 73 (*)    ALT 92 (*)    Total Bilirubin 1.4 (*)    All other components within normal limits  CBC - Abnormal; Notable for the following components:   RBC 6.13 (*)    All other components within normal limits  URINALYSIS, COMPLETE (UACMP) WITH MICROSCOPIC - Abnormal; Notable for the following components:   Color, Urine YELLOW (*)    APPearance CLEAR (*)    Specific Gravity, Urine 1.039 (*)    Glucose, UA >=500 (*)    Ketones, ur 5 (*)    Protein, ur 30 (*)    All other components within normal limits   LIPASE, BLOOD   ____________________________________________   EKG    ____________________________________________    RADIOLOGY  Ct Renal Stone Study  Result Date: 11/05/2018 CLINICAL DATA:  Urinary frequency with lower abdominal pain. History of colitis. EXAM: CT ABDOMEN AND PELVIS WITHOUT CONTRAST TECHNIQUE: Multidetector CT imaging of the abdomen and pelvis was  performed following the standard protocol without IV contrast. COMPARISON:  07/17/2017 FINDINGS: Lower chest: Stable scarring anteriorly in the right lower lobe. Hepatobiliary: Diffuse hepatic steatosis.  Otherwise unremarkable. Pancreas: Unremarkable Spleen: Unremarkable Adrenals/Urinary Tract: Both adrenal glands appear normal. 2.3 by 2.3 cm fluid density right kidney upper pole lesion compatible with cyst and stable from prior. There is a 2 mm left mid upper kidney nonobstructive renal calculus. Low position of the left kidney with non rotated appearance. Circumaortic left renal vein. No hydronephrosis. Stomach/Bowel: Prominent stool throughout the colon favors constipation. Normal appendix. Scattered colonic diverticula. There is recurrent diverticulitis in the left upper quadrant near the splenic flexure on image 41/2, although mild and not as severe as on the 07/17/2017 exam in this location. Vascular/Lymphatic: Aortoiliac atherosclerotic vascular disease. Reproductive: Unremarkable Other: No supplemental non-categorized findings. Musculoskeletal: Prior fusion at L5-S1 with both an anterior fusion device and posterolateral rod and pedicle screw fixation. Bilateral pars defects at L5. There is evidence of mild to moderate right foraminal impingement at L4-5 due to facet spurring, and mild right foraminal impingement at L5-S1 due to intervertebral spurring. IMPRESSION: 1. Mild acute diverticulitis at the splenic flexure. This is less severe than the diverticulitis that the patient had at the same location on 07/17/2017. 2. 2 mm  nonobstructive left mid upper kidney calculus. 3. Low position of the non rotated left kidney, as before. 4. Diffuse hepatic steatosis. 5. Postoperative findings at L5-S1 with pars defects at L5. Foraminal impingement at L4-5 and L5-S1 as detailed above. 6.  Aortic Atherosclerosis (ICD10-I70.0). 7.  Prominent stool throughout the colon favors constipation. Electronically Signed   By: Gaylyn RongWalter  Liebkemann M.D.   On: 11/05/2018 18:01    ____________________________________________   PROCEDURES Procedures  ____________________________________________  DIFFERENTIAL DIAGNOSIS   Ureterolithiasis, pelvic appendicitis, diverticulitis  CLINICAL IMPRESSION / ASSESSMENT AND PLAN / ED COURSE  Pertinent labs & imaging results that were available during my care of the patient were reviewed by me and considered in my medical decision making (see chart for details).    Patient presents with lower abdominal pain and tenderness over the past 6 days.  Vital signs show slight tachycardia but overall are unremarkable.  Labs are unremarkable.  No signs of urinary retention or urine infection.  Patient is nontoxic and comfortable and stable for discharge home and outpatient follow-up.      ____________________________________________   FINAL CLINICAL IMPRESSION(S) / ED DIAGNOSES    Final diagnoses:  Diverticulitis of large intestine without perforation or abscess without bleeding  Lower abdominal pain   ED Discharge Orders         Ordered    ciprofloxacin (CIPRO) 500 MG tablet  2 times daily     11/05/18 1909    metroNIDAZOLE (FLAGYL) 500 MG tablet  3 times daily     11/05/18 1909    naproxen (NAPROSYN) 500 MG tablet  2 times daily with meals     11/05/18 1909    ondansetron (ZOFRAN ODT) 4 MG disintegrating tablet  Every 8 hours PRN     11/05/18 1909          Portions of this note were generated with dragon dictation software. Dictation errors may occur despite best attempts at  proofreading.   Sharman CheekStafford, Lakiyah Arntson, MD 11/05/18 [id number]

## 2018-11-05 NOTE — ED Triage Notes (Signed)
Pt c/o of urinary frequency (20 times during night). Went to urology and had negative UA. C/o lower abd pain. Hx colitis. States pain hurts when he breaths in. Pain x 6 days.   Left lower leg amputee.   A&O, ambulatory. No distress noted.

## 2018-11-07 ENCOUNTER — Telehealth: Payer: Self-pay

## 2018-11-07 NOTE — Telephone Encounter (Signed)
Patient notified

## 2018-11-07 NOTE — Telephone Encounter (Signed)
-----   Message from Harle Battiest, PA-C sent at 11/07/2018  8:09 AM EST ----- Please let Todd [name] know that his urine culture was negative.

## 2018-11-14 ENCOUNTER — Encounter (INDEPENDENT_AMBULATORY_CARE_PROVIDER_SITE_OTHER): Payer: Self-pay | Admitting: Orthopedic Surgery

## 2018-11-14 ENCOUNTER — Ambulatory Visit (INDEPENDENT_AMBULATORY_CARE_PROVIDER_SITE_OTHER): Payer: Medicare Other | Admitting: Orthopedic Surgery

## 2018-11-14 VITALS — Ht 68.0 in | Wt 270.0 lb

## 2018-11-14 DIAGNOSIS — Z89512 Acquired absence of left leg below knee: Secondary | ICD-10-CM
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
## 2018-11-23 ENCOUNTER — Ambulatory Visit: Payer: Self-pay | Admitting: Urology

## 2018-11-23 ENCOUNTER — Encounter: Payer: Self-pay | Admitting: Urology

## 2018-11-23 NOTE — Progress Notes (Incomplete)
11/02/2018  7:31 AM   Todd [name] Mar 01, 1966 [id number]  Referring provider: Preston Fleeting, MD [address] [place], [place] [address]  No chief complaint on file.   HPI: Todd [name] is a 53 y.o. male Hispanic with BPH, LUTS, and a recent history of UTIs who presents today for evaluation of pressure and pain & severe frequency.    Background History He c/o of frequency, nocturia and incontinence. He was previously on Mrybetriq which gave him dizinness and headaches. He switched to Oxybutynin 5 mg 3x daily.  He states he has noticed some improvement with the oxybutynin.  He has noticed a dry mouth with this medication.   He had a UA culture from 08/23/2018 positive for Citrobacter koseri which was treated with Bactrim.  He reported incontinence, urgency which has been going on for 24 years .He also reported suprapubic pressure and intermittent burning symptoms. Pt reported he was told that he had a "thinning of the bladder" after extensive evaluation in [place], [place] at age 33.  He states he was taking to the OR and had what sounds like a cystoscopy with fulguration and hydro distention.  He also described a test done in the office where they put in a solution and it burned which sounds like a potassium sensitivity test.  He also described a solution that was placed in his bladder several times which sounds like rescues solutions.  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  He has a FHx of lung, throat and intestinal cancer.   He also has reported an itching sensation on the inside shaft of his penis where he feels like he is about to urinate but does not. He does not smoke or drink. He has DM.   On 09/23/2018, his I PSS was 13/3 and UA showed many bacteria; culture found Citrobacter koseri, he was originally prescribed Bactrim but had an allergic reaction & was switched to Cipro. His PVR was 14 mL. Most recent HgbA1c was 7.8% in 06/2018.  He still continues to eat a lot of  rice as he is [place] and this is a mainstay of his diet.  He stated that he was drinking a lot of water.  On 11/02/2018, on the symptom check list he complained of frequent urination, dysuria, nocturia, leakage, and having to strain to urinate.  UA was negative, aside from GLU 3+.  Patient admits suprapubic pain.  Numerous issues have interfered with ability to exercise to lose weight; he has been referred to physical therapy for the shoulder only.  He admits to being depressed over all of this, and PTSD.  He does not feel the motivation to make appointments and has been suffering from anxiety and panic attacks when driving any significant distance; he would prefer to see a psychiatrist here and does not feel his current one is a good match and is tired of taking all the medications & the pain.  Patient is very happy to hear that he can get physical therapy for his pelvic issues and quite interested.  Patient is interested seeing a nutritionist as well.  Appointments have been made with local providers.  Urine culture from 11/02/2018 is negative.   PMH: Past Medical History:  Diagnosis Date   Anxiety    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   Chronic left shoulder pain 02/15/2016   Depression    Diabetes mellitus without complication [facility])    Dislocation of right thumb 06/05/2016   Diverticulitis  Gastroparesis 06/08/2017   GERD (gastroesophageal reflux disease)    HTN (hypertension) 05/19/2016   Hypertension    MDD (major depressive disorder), recurrent episode (HCC) 05/28/2016   Neck pain, bilateral 02/15/2016   Primary osteoarthritis of right knee 01/31/2016   and fingers   PTSD (post-traumatic stress disorder) 06/05/2016   Severe recurrent major depression without psychotic features (HCC) 05/19/2016   Sleep apnea    unable to tolerate CPAP   Substance induced mood disorder (HCC) 05/19/2016   Suicidal ideation 05/19/2016   Type 2 diabetes mellitus with  complication, without long-term current use of insulin (HCC) 06/08/2017   Uncontrolled type 2 diabetes mellitus with hyperglycemia, without long-term current use of insulin (HCC) 03/12/2017    Surgical History: Past Surgical History:  Procedure Laterality Date   BACK SURGERY  11/209/18 and 10/08/17   x 2   COLONOSCOPY WITH PROPOFOL N/A 01/31/2018   Procedure: COLONOSCOPY WITH PROPOFOL;  Surgeon: Scot JunElliott, Robert T, MD;  [facility] ENDOSCOPY;  Service: Endoscopy;  Laterality: N/A;   ESOPHAGOGASTRODUODENOSCOPY (EGD) WITH PROPOFOL N/A 01/31/2018   Procedure: ESOPHAGOGASTRODUODENOSCOPY (EGD) WITH PROPOFOL;  Surgeon: Scot JunElliott, Robert T, MD;  [facility] END[facility]COPY;  Service: Endoscopy;  Laterality: N/A;   LEG AMPUTATION BELOW KNEE     left   SHOULDER SURGERY Left     Home Medications:  Allergies as of 11/23/2018      Reactions   Bactrim [sulfamethoxazole-trimethoprim] Itching, Other (See Comments)   Causes blisters   Myrbetriq [mirabegron] Other (See Comments)   Dizzy, headache   Benadryl [diphenhydramine] Anxiety, Other (See Comments)   Causes "jerking"   Diphenhydramine Hcl Anxiety, Other (See Comments)   Causes "jerking"   Tamsulosin Rash      Medication List       Accurate as of November 23, 2018  7:31 AM. Always use your most recent med list.        atorvastatin 20 MG tablet Commonly known as:  LIPITOR Take 20 mg by mouth daily.   B-D UF III MINI PEN NEEDLES 31G X 5 MM Misc Generic drug:  Insulin Pen Needle   ciprofloxacin 500 MG tablet Commonly known as:  CIPRO Take 1 tablet (500 mg total) by mouth 2 (two) times daily.   cloNIDine 0.2 MG tablet Commonly known as:  CATAPRES Take 0.2 mg by mouth daily.   dapagliflozin propanediol 10 MG Tabs tablet Commonly known as:  FARXIGA Take 10 mg by mouth daily.   glipiZIDE 10 MG 24 hr tablet Commonly known as:  GLUCOTROL XL Take 10 mg by mouth daily with breakfast.   hydrochlorothiazide 25 MG tablet Commonly  known as:  HYDRODIURIL take 1 tablet by mouth once daily for BLOOD PRESSURE   ibuprofen 600 MG tablet Commonly known as:  ADVIL,MOTRIN Take 1 tablet (600 mg total) every 8 (eight) hours as needed by mouth.   JANUVIA 100 MG tablet Generic drug:  sitaGLIPtin Take 100 mg by mouth daily.   lisinopril 40 MG tablet Commonly known as:  PRINIVIL,ZESTRIL Take 1 tablet (40 mg total) by mouth daily.   LORazepam 0.5 MG tablet Commonly known as:  ATIVAN Take 1 tablet (0.5 mg total) by mouth every 8 (eight) hours as needed for anxiety.   metFORMIN 500 MG tablet Commonly known as:  GLUCOPHAGE Take 1,500 mg by mouth daily with supper.   metroNIDAZOLE 500 MG tablet Commonly known as:  FLAGYL Take 1 tablet (500 mg total) by mouth 3 (three) times daily.   multivitamin tablet Take 1 tablet  by mouth daily.   naproxen 500 MG tablet Commonly known as:  NAPROSYN Take 1 tablet (500 mg total) by mouth 2 (two) times daily with a meal.   omeprazole 10 MG capsule Commonly known as:  PRILOSEC Take 10 mg by mouth daily.   ondansetron 4 MG disintegrating tablet Commonly known as:  ZOFRAN ODT Take 1 tablet (4 mg total) by mouth every 8 (eight) hours as needed for nausea or vomiting.   ONETOUCH DELICA LANCETS FINE Misc USE TO TEST three times a day   ONETOUCH VERIO test strip Generic drug:  glucose blood   oxybutynin 5 MG tablet Commonly known as:  DITROPAN TAKE 1 TABLET(5 MG) BY MOUTH THREE TIMES DAILY       Allergies:  Allergies  Allergen Reactions   Bactrim [Sulfamethoxazole-Trimethoprim] Itching and Other (See Comments)    Causes blisters   Myrbetriq [Mirabegron] Other (See Comments)    Dizzy, headache   Benadryl [Diphenhydramine] Anxiety and Other (See Comments)    Causes "jerking"   Diphenhydramine Hcl Anxiety and Other (See Comments)    Causes "jerking"   Tamsulosin Rash    Family History: Family History  Problem Relation Age of Onset   Diabetes Mother     Social  History:  reports that he quit smoking about 30 years ago. His smoking use included cigarettes. He has a 11.00 pack-year smoking history. He has never used smokeless tobacco. He reports that he does not drink alcohol or use drugs.  ROS:                                        Physical Exam: There were no vitals taken for this visit.  Constitutional:  Well nourished. Alert and oriented, No acute distress. Cardiovascular: No clubbing, cyanosis, or edema. Respiratory: Normal respiratory effort, no increased work of breathing. Skin: No rashes, bruises or suspicious lesions. Neurologic: Grossly intact, no focal deficits, moving all 4 extremities. Psychiatric: Anxious  Laboratory Data: Component     Latest Ref Rng & Units 08/23/2018  Prostate Specific Ag, Serum     0.0 - 4.0 ng/mL 2.4   Urinalysis His UA is largely negative, with GLU 3+. I have reviewed the labs.  Pertinent Imaging: Component     Latest Ref Rng & Units 08/23/2018 09/23/2018  Scan Result      16mL 14   Assessment & Plan:    1. BPH with urinary frequency - Symptoms are primarily irritated with urgency and frequency - Adequate bladder emptying today; PVR is 14 mL - Likely multifactorial including diabetes, behavioral possibly with obstruction as contributing factor (BPH)  - Advised that blood sugars should be under control - talked about limited rice intake - Discontinued Mirabegron 50 gm to treat overactivity symptoms; could not tolerate medicine due to headaches and dizziness  - Switched to Oxybutynin 5 mg 3x a day - which has improved his symptoms somewhat  2. Suprapubic pain/dysuria - Referred patient for physical therapy - UA sent for culture to rule out indolent infection   3. PSA Screening  - PSA 2.4 on 08/23/2018  4. DM - referred to [facility] nutritionist services  5. Anxiety/depression - refer to local psychiatrist   No follow-ups on file.  [facility] IncNethusan  Sivanesan  [facility] [address], [address] [place], [place] [address] [phone]  I, Donne HazelNethusan Sivanesan, am acting as a Neurosurgeonscribe for [employer]annon McGowan  PA-C,  {Add Production assistant, radio Statement}

## 2018-11-24 ENCOUNTER — Encounter: Payer: Medicare Other | Attending: Family Medicine | Admitting: Dietician

## 2018-11-24 ENCOUNTER — Encounter: Payer: Self-pay | Admitting: Dietician

## 2018-11-24 VITALS — Ht 68.0 in | Wt 271.4 lb

## 2018-11-24 DIAGNOSIS — E669 Obesity, unspecified: Secondary | ICD-10-CM | POA: Insufficient documentation

## 2018-11-24 DIAGNOSIS — Z7984 Long term (current) use of oral hypoglycemic drugs: Secondary | ICD-10-CM | POA: Insufficient documentation

## 2018-11-24 DIAGNOSIS — Z79899 Other long term (current) drug therapy: Secondary | ICD-10-CM | POA: Insufficient documentation

## 2018-11-24 DIAGNOSIS — Z6841 Body Mass Index (BMI) 40.0 and over, adult: Secondary | ICD-10-CM | POA: Diagnosis not present

## 2018-11-24 DIAGNOSIS — E118 Type 2 diabetes mellitus with unspecified complications: Secondary | ICD-10-CM | POA: Diagnosis not present

## 2018-11-24 DIAGNOSIS — E1165 Type 2 diabetes mellitus with hyperglycemia: Secondary | ICD-10-CM

## 2018-11-24 NOTE — Progress Notes (Signed)

## 2018-12-01 ENCOUNTER — Encounter: Payer: Medicare Other | Admitting: Dietician

## 2018-12-01 ENCOUNTER — Encounter: Payer: Self-pay | Admitting: Dietician

## 2018-12-01 VITALS — Wt 272.1 lb

## 2018-12-01 DIAGNOSIS — E1165 Type 2 diabetes mellitus with hyperglycemia: Secondary | ICD-10-CM

## 2018-12-01 DIAGNOSIS — E118 Type 2 diabetes mellitus with unspecified complications: Secondary | ICD-10-CM | POA: Diagnosis not present

## 2018-12-01 NOTE — Progress Notes (Signed)
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
## 2018-12-05 ENCOUNTER — Other Ambulatory Visit: Payer: Self-pay

## 2018-12-05 ENCOUNTER — Encounter: Payer: Self-pay | Admitting: Urology

## 2018-12-05 ENCOUNTER — Ambulatory Visit (INDEPENDENT_AMBULATORY_CARE_PROVIDER_SITE_OTHER): Payer: Medicare Other | Admitting: Urology

## 2018-12-05 VITALS — BP 153/84 | HR 72 | Temp 95.9°F | Resp 16 | Ht 68.0 in | Wt 284.0 lb

## 2018-12-05 DIAGNOSIS — R3915 Urgency of urination: Secondary | ICD-10-CM | POA: Diagnosis not present

## 2018-12-05 DIAGNOSIS — R350 Frequency of micturition: Secondary | ICD-10-CM

## 2018-12-05 DIAGNOSIS — N401 Enlarged prostate with lower urinary tract symptoms: Secondary | ICD-10-CM

## 2018-12-05 DIAGNOSIS — M6289 Other specified disorders of muscle: Secondary | ICD-10-CM

## 2018-12-05 MED ORDER — FESOTERODINE FUMARATE ER 8 MG PO TB24: 8.0000 mg | ORAL_TABLET | Freq: Every day | ORAL | 0 refills | Status: DC

## 2018-12-06 ENCOUNTER — Telehealth: Payer: Self-pay | Admitting: Urology

## 2018-12-06 NOTE — Telephone Encounter (Signed)
Please review message below and advise.  

## 2018-12-06 NOTE — Telephone Encounter (Signed)
Pt called and asked if he was supposed to discontinue oxybutynin 5mg  and start Toviaz.Please advise.

## 2018-12-07 NOTE — Telephone Encounter (Signed)
Yes.  He needs to stop the oxybutynin while he is taking Toviaz.

## 2018-12-07 NOTE — Telephone Encounter (Signed)
Called and advised patient to stop taking Oxybutynin while taking Toviaz. Patient verbalized understanding. Patient stated he was having trouble signing up for Mychart- activation code given to patient.

## 2018-12-08 ENCOUNTER — Encounter: Payer: Medicare Other | Admitting: Dietician

## 2018-12-08 ENCOUNTER — Encounter: Payer: Self-pay | Admitting: Dietician

## 2018-12-08 VITALS — BP 130/84 | Ht 68.0 in | Wt 276.0 lb

## 2018-12-08 DIAGNOSIS — E118 Type 2 diabetes mellitus with unspecified complications: Secondary | ICD-10-CM | POA: Diagnosis not present

## 2018-12-08 DIAGNOSIS — E1165 Type 2 diabetes mellitus with hyperglycemia: Secondary | ICD-10-CM

## 2018-12-08 NOTE — Progress Notes (Signed)

## 2018-12-12 ENCOUNTER — Encounter: Payer: Self-pay | Admitting: *Deleted

## 2018-12-15 ENCOUNTER — Other Ambulatory Visit: Payer: Self-pay | Admitting: Urology

## 2018-12-15 MED ORDER — OXYBUTYNIN CHLORIDE ER 10 MG PO TB24: 10.0000 mg | ORAL_TABLET | Freq: Every day | ORAL | 3 refills | Status: DC

## 2018-12-15 NOTE — Progress Notes (Signed)
Prescription sent in for ditropan xl 10 mg.

## 2018-12-26 ENCOUNTER — Encounter: Payer: Self-pay | Admitting: Urology

## 2018-12-26 ENCOUNTER — Other Ambulatory Visit: Payer: Self-pay

## 2018-12-26 ENCOUNTER — Ambulatory Visit (INDEPENDENT_AMBULATORY_CARE_PROVIDER_SITE_OTHER): Payer: Medicare Other | Admitting: Urology

## 2018-12-26 VITALS — BP 129/75 | HR 74 | Temp 96.9°F | Ht 68.0 in | Wt 276.0 lb

## 2018-12-26 DIAGNOSIS — R3915 Urgency of urination: Secondary | ICD-10-CM | POA: Diagnosis not present

## 2018-12-26 DIAGNOSIS — R102 Pelvic and perineal pain: Secondary | ICD-10-CM | POA: Diagnosis not present

## 2018-12-26 DIAGNOSIS — N401 Enlarged prostate with lower urinary tract symptoms: Secondary | ICD-10-CM | POA: Diagnosis not present

## 2018-12-26 DIAGNOSIS — R350 Frequency of micturition: Secondary | ICD-10-CM | POA: Diagnosis not present

## 2018-12-26 DIAGNOSIS — R1024 Suprapubic pain: Secondary | ICD-10-CM

## 2018-12-26 LAB — BLADDER SCAN AMB NON-IMAGING
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
## 2019-02-20 ENCOUNTER — Ambulatory Visit (INDEPENDENT_AMBULATORY_CARE_PROVIDER_SITE_OTHER): Payer: Medicare Other | Admitting: Licensed Clinical Social Worker

## 2019-02-20 ENCOUNTER — Other Ambulatory Visit: Payer: Self-pay

## 2019-02-20 DIAGNOSIS — F331 Major depressive disorder, recurrent, moderate: Secondary | ICD-10-CM

## 2019-03-08 ENCOUNTER — Other Ambulatory Visit: Payer: Self-pay

## 2019-03-08 ENCOUNTER — Ambulatory Visit: Payer: Medicare Other | Admitting: Licensed Clinical Social Worker
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
## 2019-03-22 ENCOUNTER — Other Ambulatory Visit: Payer: Self-pay

## 2019-03-22 ENCOUNTER — Emergency Department
Admission: EM | Admit: 2019-03-22 | Discharge: 2019-03-22 | Disposition: A | Discharge status: Home / Self Care | Payer: Medicare Other | Attending: Emergency Medicine | Admitting: Emergency Medicine

## 2019-03-22 ENCOUNTER — Emergency Department: Payer: Medicare Other

## 2019-03-22 ENCOUNTER — Encounter: Payer: Self-pay | Admitting: Emergency Medicine

## 2019-03-22 DIAGNOSIS — Z79899 Other long term (current) drug therapy: Secondary | ICD-10-CM | POA: Insufficient documentation

## 2019-03-22 DIAGNOSIS — K529 Noninfective gastroenteritis and colitis, unspecified: Secondary | ICD-10-CM

## 2019-03-22 DIAGNOSIS — I10 Essential (primary) hypertension: Secondary | ICD-10-CM | POA: Insufficient documentation

## 2019-03-22 DIAGNOSIS — Z87891 Personal history of nicotine dependence: Secondary | ICD-10-CM | POA: Insufficient documentation

## 2019-03-22 DIAGNOSIS — Z7984 Long term (current) use of oral hypoglycemic drugs: Secondary | ICD-10-CM | POA: Diagnosis not present

## 2019-03-22 DIAGNOSIS — Z89512 Acquired absence of left leg below knee: Secondary | ICD-10-CM | POA: Diagnosis not present

## 2019-03-22 DIAGNOSIS — R1032 Left lower quadrant pain: Secondary | ICD-10-CM | POA: Diagnosis present

## 2019-03-22 DIAGNOSIS — E119 Type 2 diabetes mellitus without complications: Secondary | ICD-10-CM | POA: Insufficient documentation

## 2019-03-22 LAB — URINALYSIS, COMPLETE (UACMP) WITH MICROSCOPIC
Bacteria, UA: NONE SEEN
Bilirubin Urine: NEGATIVE
Glucose, UA: >=500 mg/dL — AB
Hgb urine dipstick: NEGATIVE
Ketones, ur: 5 mg/dL — AB
Leukocytes,Ua: NEGATIVE
Nitrite: NEGATIVE
Protein, ur: NEGATIVE mg/dL
Specific Gravity, Urine: 1.035 — ABNORMAL HIGH (ref 1.005–1.030)
pH: 5 (ref 5.0–8.0)

## 2019-03-22 LAB — COMPREHENSIVE METABOLIC PANEL
ALT: 50 U/L — ABNORMAL HIGH (ref 0–44)
AST: 39 U/L (ref 15–41)
Albumin: 4.4 g/dL (ref 3.5–5.0)
Alkaline Phosphatase: 53 U/L (ref 38–126)
Anion gap: 8 (ref 5–15)
BUN: 16 mg/dL (ref 6–20)
CO2: 22 mmol/L (ref 22–32)
Calcium: 8.7 mg/dL — ABNORMAL LOW (ref 8.9–10.3)
Chloride: 111 mmol/L (ref 98–111)
Creatinine, Ser: 1.03 mg/dL (ref 0.61–1.24)
GFR calc Af Amer: >60 mL/min (ref >60)
GFR calc non Af Amer: >60 mL/min (ref >60)
Glucose, Bld: 128 mg/dL — ABNORMAL HIGH (ref 70–99)
Potassium: 3.9 mmol/L (ref 3.5–5.1)
Sodium: 141 mmol/L (ref 135–145)
Total Bilirubin: 0.9 mg/dL (ref 0.3–1.2)
Total Protein: 7.4 g/dL (ref 6.5–8.1)

## 2019-03-22 LAB — CBC
HCT: 47.1 % (ref 39.0–52.0)
Hemoglobin: 15.6 g/dL (ref 13.0–17.0)
MCH: 26.8 pg (ref 26.0–34.0)
MCHC: 33.1 g/dL (ref 30.0–36.0)
MCV: 80.9 fL (ref 80.0–100.0)
Platelets: 204 10*3/uL (ref 150–400)
RBC: 5.82 MIL/uL — ABNORMAL HIGH (ref 4.22–5.81)
RDW: 13.4 % (ref 11.5–15.5)
WBC: 7.1 10*3/uL (ref 4.0–10.5)
nRBC: 0 % (ref 0.0–0.2)

## 2019-03-22 LAB — LIPASE, BLOOD: Lipase: 33 U/L (ref 11–51)

## 2019-03-22 MED ORDER — HYDROCODONE-ACETAMINOPHEN 5-325 MG PO TABS: 1.0000 | ORAL_TABLET | ORAL | 0 refills | Status: DC | PRN

## 2019-03-22 MED ORDER — AMOXICILLIN-POT CLAVULANATE 875-125 MG PO TABS: 1.0000 | ORAL_TABLET | Freq: Two times a day (BID) | ORAL | 0 refills | Status: AC

## 2019-03-22 MED ORDER — SODIUM CHLORIDE 0.9% FLUSH: 3.0000 mL | Freq: Once | INTRAVENOUS | Status: DC

## 2019-03-22 MED ORDER — DOCUSATE SODIUM 100 MG PO CAPS: 100.0000 mg | ORAL_CAPSULE | Freq: Two times a day (BID) | ORAL | 0 refills | Status: AC

## 2019-03-22 NOTE — ED Triage Notes (Signed)
LLQ abdominal pain x 2-3 weeks.  Pain worsened yesterday and woke patient up last night.  Denies N/V/D

## 2019-03-22 NOTE — ED Notes (Signed)
Patient reports multiple recent episodes of sharp abdominal pain but reports last night at 11pm he had a severe episode that has not completely resolved.  Patient reports history of diverticulitis.

## 2019-03-22 NOTE — ED Provider Notes (Signed)
[facility] Emergency Department Provider Note  Time seen: 4:22 PM  I have reviewed the triage vital signs and the nursing notes.   HISTORY  Chief Complaint Abdominal Pain   HPI Todd [name] is a 53 y.o. male past medical history of anxiety, diabetes, diverticulitis, hypertension, gastric reflux, presents to the emergency department for left lower quadrant abdominal pain.  According to the patient at 11 PM last night he developed moderate to severe left lower quadrant abdominal pain states it was a 7 out of 10 sharp pain last night.  Patient states a history of diverticulitis in the past which is felt somewhat similar so he presented to the emergency department today.  States the pain is only mild today but was concerned he could be developing diverticulitis once again.  Denies any dysuria or hematuria.  No fever cough congestion or shortness of breath.  Denies any vomiting or diarrhea.   Past Medical History:  Diagnosis Date  . Anxiety   . 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 Chronic left shoulder pain 02/15/2016  . Depression   . Diabetes mellitus without complication (HCC)   . Dislocation of right thumb 06/05/2016  . Diverticulitis   . Gastroparesis 06/08/2017  . GERD (gastroesophageal reflux disease)   . HTN (hypertension) 05/19/2016  . Hypertension   . MDD (major depressive disorder), recurrent episode (HCC) 05/28/2016  . Neck pain, bilateral 02/15/2016  . Primary osteoarthritis of right knee 01/31/2016   and fingers  . PTSD (post-traumatic stress disorder) 06/05/2016  . Severe recurrent major depression without psychotic features (HCC) 05/19/2016  . Sleep apnea    unable to tolerate CPAP  . Substance induced mood disorder (HCC) 05/19/2016  . Suicidal ideation 05/19/2016  . Type 2 diabetes mellitus with complication, without long-term current use of insulin (HCC) 06/08/2017  . Uncontrolled type 2 diabetes mellitus with hyperglycemia, without  long-term current use of insulin (HCC) 03/12/2017    Patient Active Problem List   Diagnosis Date Noted  . Closed fracture of distal end of radius 05/31/2018  . Radiculopathy of lumbosacral region 09/30/2017  . Spondylolisthesis at L5-S1 level 09/30/2017  . Morbid obesity (HCC) 09/06/2017  . Hepatic steatosis 08/26/2017  . Non-intractable vomiting with nausea 08/26/2017  . Upper abdominal pain 08/26/2017  . 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 Gastroparesis 06/08/2017  . History of noncompliance with medical treatment 06/08/2017  . Diabetes mellitus type 2 in obese (HCC) 06/08/2017  . Uncontrolled type 2 diabetes mellitus with hyperglycemia (HCC) 03/12/2017  . 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 Dislocation of right thumb 06/05/2016  . Anxiety disorder 06/05/2016  . MDD (major depressive disorder), recurrent episode (HCC) 05/28/2016  . Substance induced mood disorder (HCC) 05/19/2016  . Severe recurrent major depression without psychotic features (HCC) 05/19/2016  . Suicidal ideation 05/19/2016  . Essential (primary) hypertension 05/19/2016  . GERD (gastroesophageal reflux disease) 05/19/2016  . Alcohol use disorder, mild, abuse 05/19/2016  . Chronic left shoulder pain 02/15/2016  . Neck pain, bilateral 02/15/2016  . Primary osteoarthritis of right knee 01/31/2016    Past Surgical History:  Procedure Laterality Date  . BACK SURGERY  11/209/18 and 10/08/17   x 2  . COLONOSCOPY WITH PROPOFOL N/A 01/31/2018   Procedure: COLONOSCOPY WITH PROPOFOL;  Surgeon: Scot JunElliott, Robert T, MD;  [facility];  Service: Endoscopy;  Laterality: N/A;  . ESOPHAGOGASTRODUODENOSCOPY (EGD) WITH PROPOFOL N/A 01/31/2018   Procedure: ESOPHAGOGASTRODUODENOSCOPY (EGD) WITH PROPOFOL;  Surgeon: Scot JunElliott, Robert T, MD;  Location:  [place] ENDOSCOPY;  Service: Endoscopy;  Laterality: N/A;  . LEG AMPUTATION BELOW KNEE     left  . SHOULDER SURGERY Left     Prior to Admission medications   Medication  Sig Start Date End Date Taking? Authorizing Provider  atorvastatin (LIPITOR) 20 MG tablet Take 20 mg by mouth daily.    [provider]  B-D UF III MINI PEN NEEDLES 31G X 5 MM MISC  05/04/17   [provider]  ciprofloxacin (CIPRO) 500 MG tablet Take 1 tablet (500 mg total) by mouth 2 (two) times daily. 11/05/18   Carrie Mew, MD  cloNIDine (CATAPRES) 0.2 MG tablet Take 0.2 mg by mouth daily.  04/26/17   [provider]  dapagliflozin propanediol (FARXIGA) 10 MG TABS tablet Take 10 mg by mouth daily.  08/26/18 08/26/19  [provider]  fesoterodine (TOVIAZ) 8 MG TB24 tablet Take 1 tablet (8 mg total) by mouth daily. 12/05/18   Zara Council A, PA-C  glipiZIDE (GLUCOTROL XL) 10 MG 24 hr tablet Take 10 mg by mouth daily with breakfast.  07/12/17   [provider]  hydrochlorothiazide (HYDRODIURIL) 25 MG tablet take 1 tablet by mouth once daily for BLOOD PRESSURE 05/04/17   [provider]  ibuprofen (ADVIL,MOTRIN) 600 MG tablet Take 1 tablet (600 mg total) every 8 (eight) hours as needed by mouth. 08/26/17   Darel Hong, MD  lisinopril (PRINIVIL,ZESTRIL) 40 MG tablet Take 1 tablet (40 mg total) by mouth daily. 05/20/16   Pucilowska, Jolanta B, MD  LORazepam (ATIVAN) 0.5 MG tablet Take 1 tablet (0.5 mg total) by mouth every 8 (eight) hours as needed for anxiety. 10/12/18 10/12/19  Harvest Dark, MD  metFORMIN (GLUCOPHAGE) 500 MG tablet Take 1,500 mg by mouth daily with supper.    [provider]  metroNIDAZOLE (FLAGYL) 500 MG tablet Take 1 tablet (500 mg total) by mouth 3 (three) times daily. 11/05/18   Carrie Mew, MD  Multiple Vitamin (MULTIVITAMIN) tablet Take 1 tablet by mouth daily.    [provider]  naproxen (NAPROSYN) 500 MG tablet Take 1 tablet (500 mg total) by mouth 2 (two) times daily with a meal. 11/05/18   Carrie Mew, MD  omeprazole (PRILOSEC) 10 MG capsule Take 10 mg by mouth daily.    [provider]  omeprazole (PRILOSEC) 40 MG capsule Take 1 capsule by mouth 2 (two) times daily. 11/28/18   [provider]  ondansetron (ZOFRAN ODT) 4 MG disintegrating tablet Take 1 tablet (4 mg total) by mouth every 8 (eight) hours as needed for nausea or vomiting. 11/05/18   Carrie Mew, MD  [facility] DELICA LANCETS FINE MISC USE TO TEST three times a day 05/29/17   [provider]  [facility] VERIO test strip  06/23/17   [provider]  oxybutynin (DITROPAN-XL) 10 MG 24 hr tablet Take 1 tablet (10 mg total) by mouth at bedtime. 12/15/18   Zara Council A, PA-C  sitaGLIPtin (JANUVIA) 100 MG tablet Take 100 mg by mouth daily.  04/06/18   [provider]    Allergies  Allergen Reactions  . Bactrim [Sulfamethoxazole-Trimethoprim] Itching and Other (See Comments)    Causes blisters  . Myrbetriq [Mirabegron] Other (See Comments)    Dizzy, headache  . Benadryl [Diphenhydramine] Anxiety and Other (See Comments)    Causes "jerking"  . Diphenhydramine Hcl Anxiety and Other (See Comments)    Causes "jerking"  . Tamsulosin Rash    Family History  Problem Relation Age of  Onset  . Diabetes Mother     Social History Social History   Tobacco Use  . Smoking status: Former Smoker    Packs/day: 1.00    Years: 11.00    Pack years: 11.00    Types: Cigarettes    Last attempt to quit: 10/12/1988    Years since quitting: 30.4  . Smokeless tobacco: Never Used  Substance Use Topics  . Alcohol use: No  . Drug use: No    Review of Systems Constitutional: Negative for fever. Cardiovascular: Negative for chest pain. Respiratory: Negative for shortness of breath. Gastrointestinal: Moderate left lower quadrant abdominal pain.  Negative for nausea vomiting or diarrhea. Genitourinary: Negative for hematuria or dysuria. Musculoskeletal: Negative for musculoskeletal complaints Neurological: Negative for headache All other ROS  negative  ____________________________________________   PHYSICAL EXAM:  VITAL SIGNS: ED Triage Vitals  Enc Vitals Group     BP 03/22/19 1054 132/80     Pulse Rate 03/22/19 1054 75     Resp --      Temp 03/22/19 1054 98.9 F (37.2 C)     Temp Source 03/22/19 1054 Oral     SpO2 03/22/19 1054 98 %     Weight 03/22/19 1054 264 lb (119.7 kg)     Height 03/22/19 1054 5\' 8"  (1.727 m)     Head Circumference --      Peak Flow --      Pain Score 03/22/19 1056 7     Pain Loc --      Pain Edu? --      Excl. in GC? --     Constitutional: Alert and oriented. Well appearing and in no distress. Eyes: Normal exam ENT      Head: Normocephalic and atraumatic.      Mouth/Throat: Mucous membranes are moist. Cardiovascular: Normal rate, regular rhythm. No murmur Respiratory: Normal respiratory effort without tachypnea nor retractions. Breath sounds are clear  Gastrointestinal: Soft, very minimal suprapubic tenderness to palpation.  No rebound guarding or distention.  No CVA tenderness bilaterally. Musculoskeletal: Nontender with normal range of motion in all extremities.  Neurologic:  Normal speech and language. No gross focal neurologic deficits Skin:  Skin is warm, dry and intact.  Psychiatric: Mood and affect are normal  ____________________________________________    RADIOLOGY  CT scan shows likely infectious colitis.  ____________________________________________   INITIAL IMPRESSION / ASSESSMENT AND PLAN / ED COURSE  Pertinent labs & imaging results that were available during my care of the patient were reviewed by me and considered in my medical decision making (see chart for details).   Patient presents emergency department left lower quadrant abdominal pain, mild tenderness on palpation.  Lab work is largely within normal limits/baseline for the patient no significant white blood cell count elevation.  We will proceed with CT imaging, differential at this time would include  colitis, diverticulitis, ureterolithiasis, UTI, less likely appendicitis.  CT scan is resulted showing likely descending colon colitis.  We will place the patient on Augmentin twice daily as well as pain medication and have the patient follow-up with his primary care doctor.  I discussed return precautions.  Patient agreeable to plan of care.  Todd [name] was evaluated in Emergency Department on 03/22/2019 for the symptoms described in the history of present illness. He was evaluated in the context of the global COVID-19 pandemic, which necessitated consideration that the patient might be at risk for infection with the SARS-CoV-2 virus that causes COVID-19. Institutional protocols and algorithms that pertain  to the evaluation of patients at risk for COVID-19 are in a state of rapid change based on information released by regulatory bodies including the CDC and federal and state organizations. These policies and algorithms were followed during the patient's care in the ED.  ____________________________________________   FINAL CLINICAL IMPRESSION(S) / ED DIAGNOSES  Colitis   Minna AntisPaduchowski, Willowdean Luhmann, MD 03/22/19 1624

## 2019-03-30 ENCOUNTER — Other Ambulatory Visit: Payer: Self-pay

## 2019-03-30 ENCOUNTER — Telehealth (INDEPENDENT_AMBULATORY_CARE_PROVIDER_SITE_OTHER): Payer: Medicare Other | Admitting: Urology

## 2019-03-30 ENCOUNTER — Ambulatory Visit: Payer: Medicare Other | Admitting: Licensed Clinical Social Worker

## 2019-03-30 ENCOUNTER — Telehealth: Payer: Self-pay | Admitting: Urology

## 2019-03-30 DIAGNOSIS — N138 Other obstructive and reflux uropathy: Secondary | ICD-10-CM

## 2019-03-30 DIAGNOSIS — N3010 Interstitial cystitis (chronic) without hematuria: Secondary | ICD-10-CM

## 2019-03-30 DIAGNOSIS — N401 Enlarged prostate with lower urinary tract symptoms: Secondary | ICD-10-CM | POA: Diagnosis not present

## 2019-03-30 MED ORDER — OXYBUTYNIN CHLORIDE ER 10 MG PO TB24: 10.0000 mg | ORAL_TABLET | Freq: Every day | ORAL | 3 refills | Status: DC

## 2019-03-30 NOTE — Telephone Encounter (Signed)
Would you schedule Todd [name] for an office visit for I PSS, PVR, PSA and exam in three months?  PSA prior to his appointment.

## 2019-03-30 NOTE — Progress Notes (Signed)
Virtual Visit via Video Note  I connected with Todd [name] on 03/30/19 at 10:30 AM EDT by a video enabled telemedicine application and verified that I am speaking with the correct person using two identifiers.    Location: Patient: Home - his wife has been exposed to individuals who tested positive for COVID-19  Provider: Home    I discussed the limitations of evaluation and management by telemedicine and the availability of in person appointments. The patient expressed understanding and agreed to proceed.  History of Present Illness: Todd [name] is a 53 year old [place] male with BPH with LU TS, IC and history of rUTI's who is contacted today via [email or web address] for three month follow up.  Patient could not access the [email or web address] text, so we continued the visit via telephone.    BPH with LU TS - predominating symptoms of frequency.  Well controlled with oxybutynin.  Patient denies any gross hematuria, dysuria or suprapubic/flank pain.  Patient denies any fevers, chills, nausea or vomiting.   IC - UA dip > 500 glucose at ED on 03/22/2019, he is not having any bladder pain at this time  History of rUTI's - no recent UTI's    Observations/Objective: Todd [name] does not sound distressed.  He is answering questions appropriately.    Assessment and Plan:  1. SARS-CoV-2 exposure Todd [name] and his wife are currently under quarantine and will undergo testing on Monday for the virus Todd [name]'s wife was exposed through her sister and nephew He is asymptomatic at this time  2. BPH with LU TS Still having nocturia  3. IC Asymptomatic at this time Continue oxybutynin    Follow Up Instructions:  Todd [name] will follow up in three months for I PSS, PVR, PSA and exam.     I discussed the assessment and treatment plan with the patient. The patient was provided an opportunity to ask questions and all were answered. The patient agreed with the plan and demonstrated an understanding of the  instructions.   The patient was advised to call back or seek an in-person evaluation if the symptoms worsen or if the condition fails to improve as anticipated.  I provided 22 minutes of non-face-to-face time during this encounter.   Bellanie Matthew, PA-C

## 2019-04-03 ENCOUNTER — Ambulatory Visit: Payer: Self-pay | Admitting: Urology

## 2019-04-18 ENCOUNTER — Other Ambulatory Visit: Payer: Self-pay | Admitting: Urology

## 2019-05-12 ENCOUNTER — Other Ambulatory Visit: Payer: Self-pay | Admitting: Student

## 2019-05-12 DIAGNOSIS — R1010 Upper abdominal pain, unspecified: Secondary | ICD-10-CM

## 2019-05-15 ENCOUNTER — Other Ambulatory Visit: Payer: Self-pay | Admitting: Student

## 2019-05-15 DIAGNOSIS — R1010 Upper abdominal pain, unspecified: Secondary | ICD-10-CM

## 2019-05-15 DIAGNOSIS — R1013 Epigastric pain: Secondary | ICD-10-CM

## 2019-05-26 ENCOUNTER — Other Ambulatory Visit: Payer: Self-pay

## 2019-05-26 ENCOUNTER — Encounter
Admission: RE | Admit: 2019-05-26 | Discharge: 2019-05-26 | Disposition: A | Discharge status: Home / Self Care | Payer: Medicare Other | Source: Ambulatory Visit | Attending: Student | Admitting: Student

## 2019-05-26 ENCOUNTER — Ambulatory Visit
Admission: RE | Admit: 2019-05-26 | Discharge: 2019-05-26 | Disposition: A | Discharge status: Home / Self Care | Payer: Medicare Other | Source: Ambulatory Visit | Attending: Student | Admitting: Student

## 2019-05-26 DIAGNOSIS — R1013 Epigastric pain: Secondary | ICD-10-CM | POA: Diagnosis present

## 2019-05-26 DIAGNOSIS — R1010 Upper abdominal pain, unspecified: Secondary | ICD-10-CM | POA: Insufficient documentation

## 2019-05-26 MED ORDER — TECHNETIUM TC 99M MEBROFENIN IV KIT
5.0000 | PACK | Freq: Once | INTRAVENOUS | Status: AC | PRN
  Administered 2019-05-26: 10:00:00 5.15 via INTRAVENOUS

## 2019-06-15 ENCOUNTER — Other Ambulatory Visit: Payer: Self-pay | Admitting: Family Medicine

## 2019-06-15 ENCOUNTER — Ambulatory Visit
Admission: RE | Admit: 2019-06-15 | Discharge: 2019-06-15 | Disposition: A | Discharge status: Home / Self Care | Payer: Medicare Other | Source: Ambulatory Visit | Attending: Family Medicine | Admitting: Family Medicine

## 2019-06-15 DIAGNOSIS — M79671 Pain in right foot: Secondary | ICD-10-CM

## 2019-06-21 ENCOUNTER — Other Ambulatory Visit: Payer: Self-pay

## 2019-06-21 DIAGNOSIS — N138 Other obstructive and reflux uropathy: Secondary | ICD-10-CM

## 2019-06-21 DIAGNOSIS — N401 Enlarged prostate with lower urinary tract symptoms: Secondary | ICD-10-CM

## 2019-06-21 DIAGNOSIS — R350 Frequency of micturition: Secondary | ICD-10-CM

## 2019-06-22 ENCOUNTER — Encounter: Payer: Self-pay | Admitting: Urology

## 2019-06-22 ENCOUNTER — Other Ambulatory Visit: Payer: Medicare Other

## 2019-06-26 ENCOUNTER — Other Ambulatory Visit: Payer: Medicare Other

## 2019-06-26 ENCOUNTER — Other Ambulatory Visit: Payer: Self-pay

## 2019-06-26 DIAGNOSIS — N138 Other obstructive and reflux uropathy: Secondary | ICD-10-CM
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
## 2019-06-27 ENCOUNTER — Encounter: Payer: Self-pay | Admitting: Urology

## 2019-06-27 ENCOUNTER — Ambulatory Visit (INDEPENDENT_AMBULATORY_CARE_PROVIDER_SITE_OTHER): Payer: Medicare Other | Admitting: Urology

## 2019-06-27 VITALS — BP 116/78 | HR 69 | Ht 68.0 in | Wt 264.2 lb

## 2019-06-27 DIAGNOSIS — N401 Enlarged prostate with lower urinary tract symptoms: Secondary | ICD-10-CM | POA: Diagnosis not present

## 2019-06-27 DIAGNOSIS — N138 Other obstructive and reflux uropathy: Secondary | ICD-10-CM | POA: Diagnosis not present

## 2019-06-27 DIAGNOSIS — N3010 Interstitial cystitis (chronic) without hematuria: Secondary | ICD-10-CM

## 2019-06-27 LAB — PSA: Prostate Specific Ag, Serum: 0.5 ng/mL (ref 0.0–4.0)

## 2019-06-27 LAB — BLADDER SCAN AMB NON-IMAGING: Scan Result: 63

## 2019-06-27 MED ORDER — OXYBUTYNIN CHLORIDE ER 10 MG PO TB24: ORAL_TABLET | ORAL | 3 refills | Status: DC

## 2019-07-12 ENCOUNTER — Ambulatory Visit: Payer: Medicare Other | Attending: Internal Medicine

## 2019-07-12 DIAGNOSIS — F5101 Primary insomnia: Secondary | ICD-10-CM | POA: Insufficient documentation

## 2019-07-12 DIAGNOSIS — G4733 Obstructive sleep apnea (adult) (pediatric): Secondary | ICD-10-CM | POA: Diagnosis not present

## 2019-07-13 ENCOUNTER — Other Ambulatory Visit: Payer: Self-pay

## 2019-07-31 IMAGING — CT CT RENAL STONE PROTOCOL
2 of 4 series · 16 of 46 positions shown, 18 images · non-contrast
Comparison: CT scan of November 05, 2018.

CLINICAL DATA: Acute left lower quadrant abdominal pain.

EXAM:
CT ABDOMEN AND PELVIS WITHOUT CONTRAST
TECHNIQUE: Multidetector CT imaging of the abdomen and pelvis was performed
following the standard protocol without IV contrast.

[Series 2: stone full standard · axial · 0.87mm/px · z∈[-566,-76]mm · 13 of 108 slices shown, 15 images]
[im 5/108  soft-tissue]
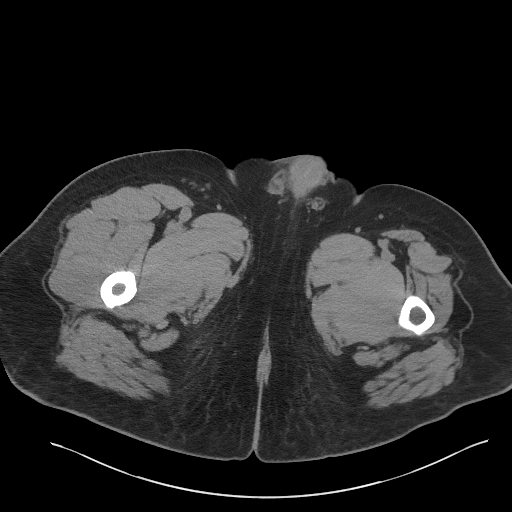
[im 5/108  bone]
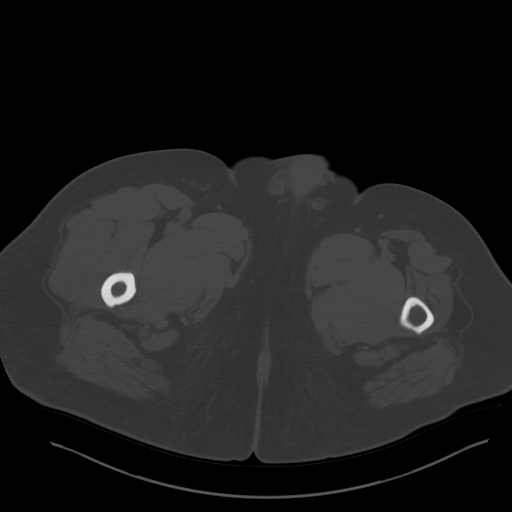
[im 14/108  soft-tissue]
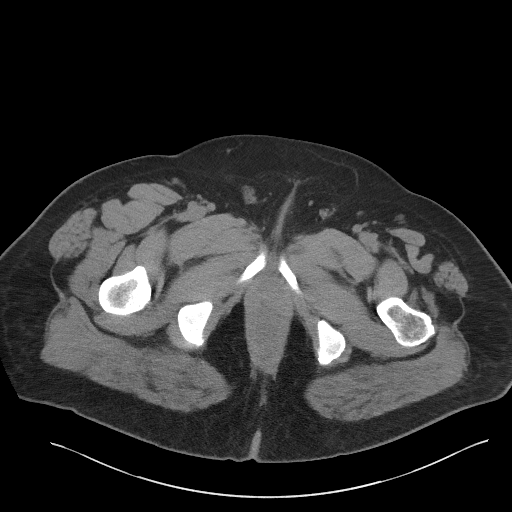
[im 24/108  soft-tissue]
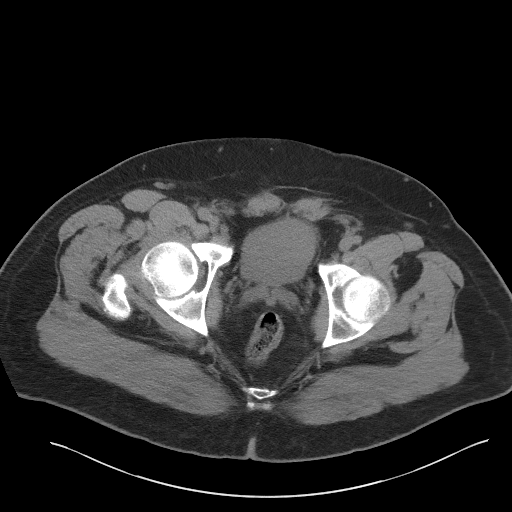
[im 28/108  soft-tissue]
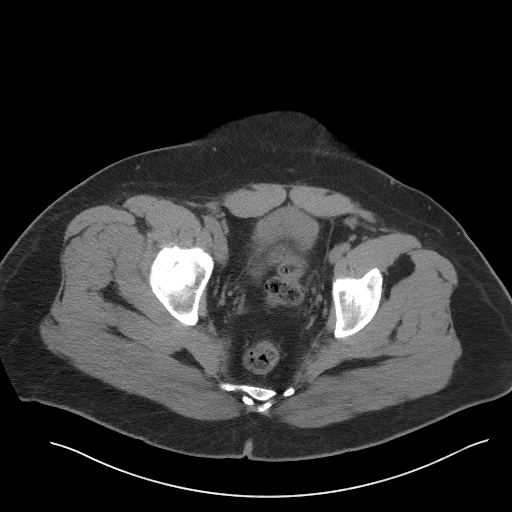
[im 38/108  soft-tissue]
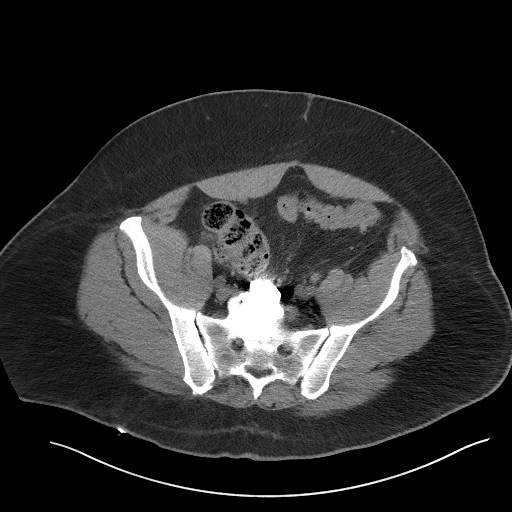
[im 47/108  soft-tissue]
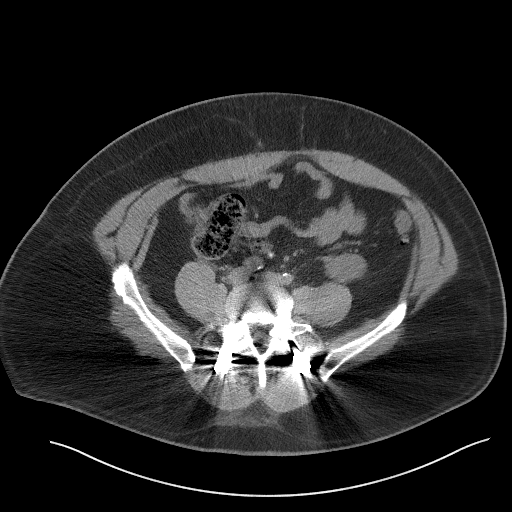
[im 56/108  soft-tissue]
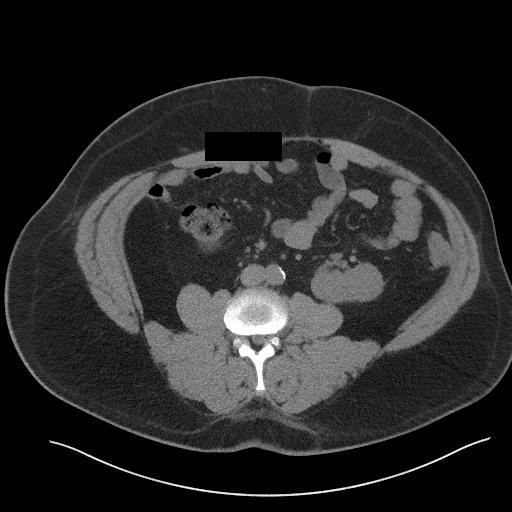
[im 61/108  soft-tissue]
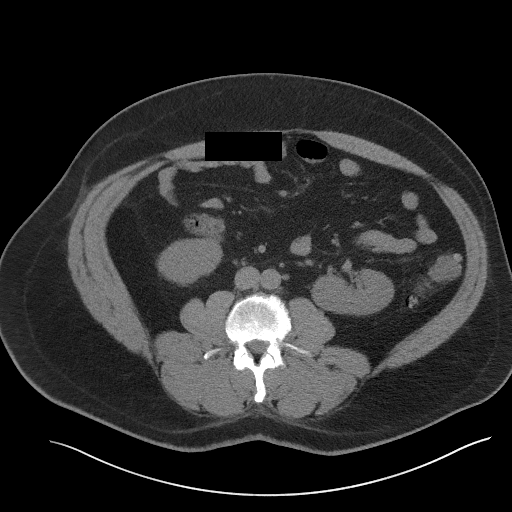
[im 70/108  soft-tissue]
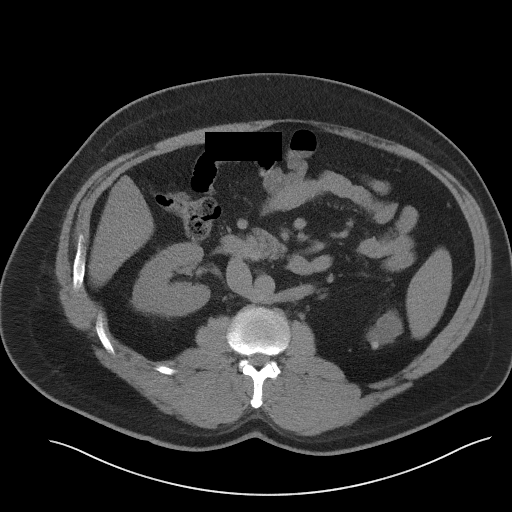
[im 70/108  bone]
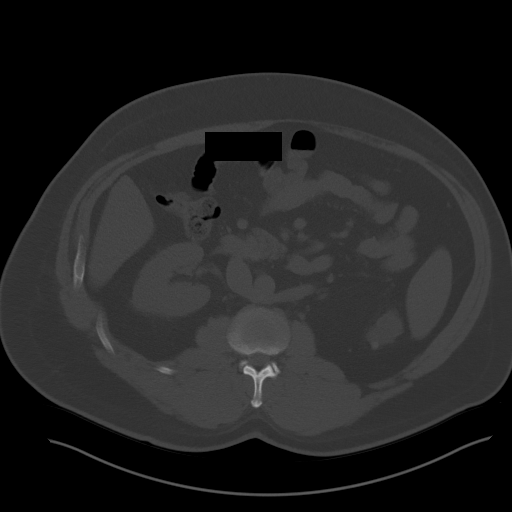
[im 80/108  soft-tissue]
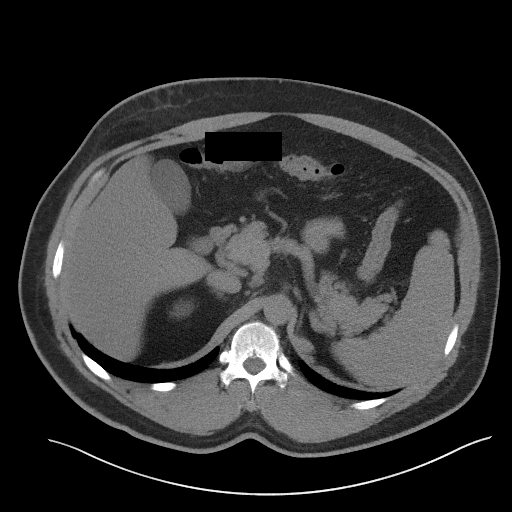
[im 84/108  soft-tissue]
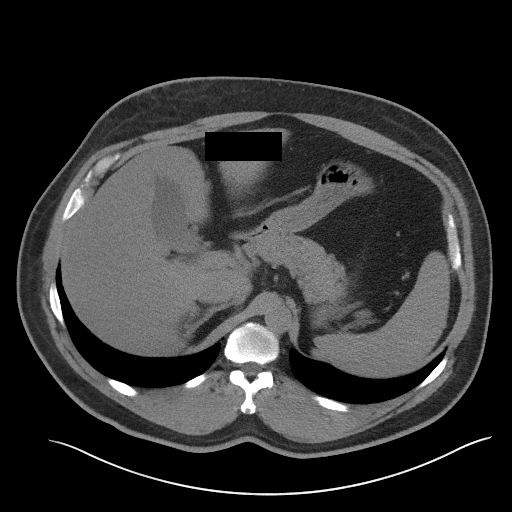
[im 94/108  soft-tissue]
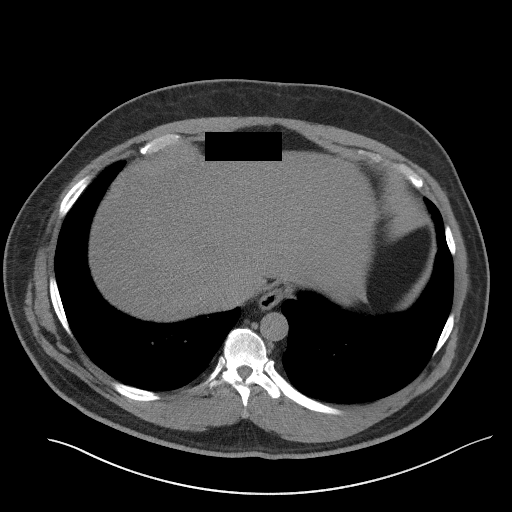
[im 103/108  soft-tissue]
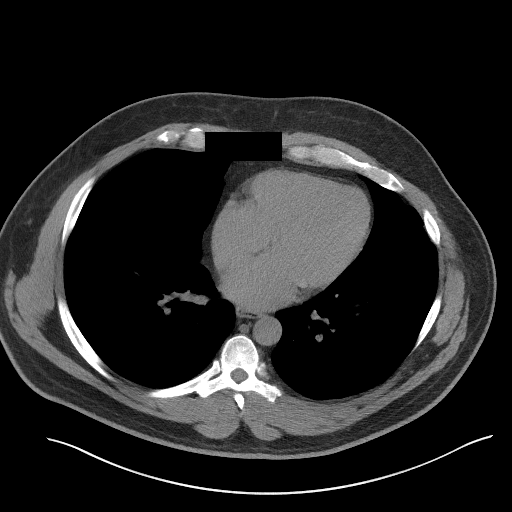

[Series 5: coronal · coronal · 0.96mm/px · 3 of 159 slices shown]
[im 53/159  soft-tissue]
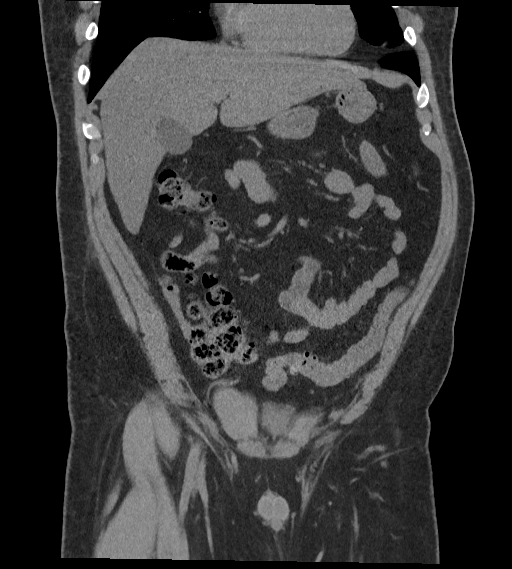
[im 71/159  soft-tissue]
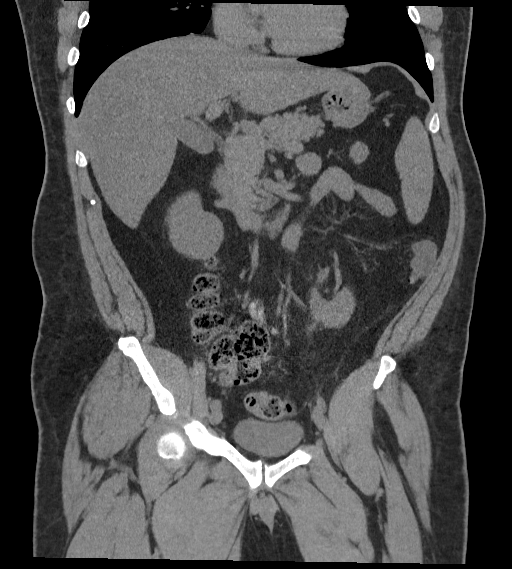
[im 88/159  soft-tissue]
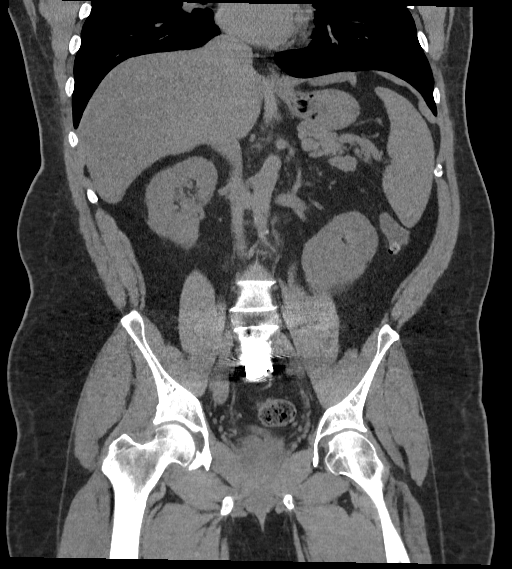

[16 of 46 positions shown; findings below may reference images not displayed]

FINDINGS: Lower chest: No acute abnormality.

Hepatobiliary: No gallstones or biliary dilatation is noted. Hepatic
steatosis is noted.

Pancreas: Unremarkable. No pancreatic ductal dilatation or
surrounding inflammatory changes.

Spleen: Normal in size without focal abnormality.

Adrenals/Urinary Tract: Adrenal glands appear normal. Stable right
renal cyst is noted. Stable small nonobstructive left renal calculus
is noted. No hydronephrosis or renal obstruction is noted. Urinary
bladder is unremarkable.

Stomach/Bowel: The stomach and appendix are unremarkable. There is
no evidence of bowel obstruction. Diverticulosis of descending and
sigmoid colon is noted. Wall thickening of proximal portion of
descending colon is noted with minimal surrounding inflammatory
changes concerning for infectious or inflammatory colitis.

Vascular/Lymphatic: Aortic atherosclerosis. No enlarged abdominal or
pelvic lymph nodes.

Reproductive: Prostate is unremarkable.

Other: No abdominal wall hernia or abnormality. No abdominopelvic
ascites.

Musculoskeletal: Status post surgical fusion of L5-S1. No acute
osseous abnormality is noted.
IMPRESSION: Wall thickening of proximal portion of descending colon is noted
with minimal surrounding inflammatory changes concerning for
infectious or inflammatory colitis.

Stable small nonobstructive left renal calculus. No hydronephrosis
or renal obstruction is noted.

Hepatic steatosis.

Aortic Atherosclerosis ([id number]-SNA.A).

## 2019-08-04 ENCOUNTER — Other Ambulatory Visit: Payer: Self-pay | Admitting: Podiatry

## 2019-08-04 ENCOUNTER — Other Ambulatory Visit: Payer: Self-pay

## 2019-08-04 ENCOUNTER — Ambulatory Visit (INDEPENDENT_AMBULATORY_CARE_PROVIDER_SITE_OTHER): Payer: Medicare Other | Admitting: Podiatry

## 2019-08-04 ENCOUNTER — Encounter: Payer: Self-pay | Admitting: Podiatry

## 2019-08-04 ENCOUNTER — Ambulatory Visit (INDEPENDENT_AMBULATORY_CARE_PROVIDER_SITE_OTHER): Payer: Medicare Other

## 2019-08-04 DIAGNOSIS — M7751 Other enthesopathy of right foot: Secondary | ICD-10-CM

## 2019-08-04 DIAGNOSIS — M205X1 Other deformities of toe(s) (acquired), right foot: Secondary | ICD-10-CM

## 2019-08-04 DIAGNOSIS — M79676 Pain in unspecified toe(s): Secondary | ICD-10-CM

## 2019-08-04 DIAGNOSIS — B351 Tinea unguium: Secondary | ICD-10-CM | POA: Diagnosis not present

## 2019-08-07 NOTE — Progress Notes (Signed)
   SUBJECTIVE Patient with a history of diabetes mellitus presents to office today complaining of elongated, thickened nails that cause pain while wearing shoes. He is unable to trim his own nails.  He also reports aching pain noted to the 1st MPJ of the right foot that has been ongoing for the past few years. He states the pain is intermittent but is progressively worsening. Walking increases the pain. He has been icing the area for treatment. Patient is here for further evaluation and treatment.   Past Medical History:  Diagnosis Date  . Anxiety   . 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 Chronic left shoulder pain 02/15/2016  . Depression   . Diabetes mellitus without complication ([place])   . Dislocation of right thumb 06/05/2016  . Diverticulitis   . Gastroparesis 06/08/2017  . GERD (gastroesophageal reflux disease)   . HTN (hypertension) 05/19/2016  . Hypertension   . MDD (major depressive disorder), recurrent episode ([place]) 05/28/2016  . Neck pain, bilateral 02/15/2016  . Primary osteoarthritis of right knee 01/31/2016   and fingers  . PTSD (post-traumatic stress disorder) 06/05/2016  . Severe recurrent major depression without psychotic features ([place]) 05/19/2016  . Sleep apnea    unable to tolerate CPAP  . Substance induced mood disorder ([place]) 05/19/2016  . Suicidal ideation 05/19/2016  . Type 2 diabetes mellitus with complication, without long-term current use of insulin ([place]) 06/08/2017  . Uncontrolled type 2 diabetes mellitus with hyperglycemia, without long-term current use of insulin ([place]) 03/12/2017    OBJECTIVE General Patient is awake, alert, and oriented x 3 and in no acute distress. Derm Skin is dry and supple RLE. Negative open lesions or macerations. Remaining integument unremarkable. Nails are tender, long, thickened and dystrophic with subungual debris, consistent with onychomycosis, 1-5 right. No signs of infection noted. Vasc  DP and PT pedal pulses palpable RLE.  Temperature gradient within normal limits.  Neuro Epicritic and protective threshold sensation diminished RLE.  Musculoskeletal Exam Pain with palpation noted to the 1st MPJ of the right foot. No symptomatic pedal deformities noted RLE. Muscular strength within normal limits.  ASSESSMENT 1. Diabetes Mellitus w/ peripheral neuropathy 2. Onychomycosis of nail due to dermatophyte bilateral 3. 1st MPJ capsulitis right 4. H/o BKA LLE  PLAN OF CARE 1. Patient evaluated today. 2. Instructed to maintain good pedal hygiene and foot care. Stressed importance of controlling blood sugar.  3. Mechanical debridement of nails 1-5 right performed using a nail nipper. Filed with dremel without incident.  4. Injection of 0.5 mLs Celestone Soluspan injected into the 1st MPJ of the right foot.  5. Prescription for Meloxicam provided to patient. 6. Return to clinic in 3 mos for routine care.     Edrick Kins, DPM [facility]  Dr. Edrick Kins, [place]                                        [place],  [address]                Office [phone]  Fax [phone]

## 2019-09-22 ENCOUNTER — Other Ambulatory Visit: Payer: Self-pay | Admitting: Otolaryngology

## 2019-09-22 ENCOUNTER — Other Ambulatory Visit (HOSPITAL_COMMUNITY): Payer: Self-pay | Admitting: Otolaryngology

## 2019-09-22 DIAGNOSIS — H9201 Otalgia, right ear: Secondary | ICD-10-CM

## 2019-10-04 ENCOUNTER — Ambulatory Visit
Admission: RE | Admit: 2019-10-04 | Discharge: 2019-10-04 | Disposition: A | Discharge status: Home / Self Care | Payer: Medicare Other | Source: Ambulatory Visit | Attending: Otolaryngology | Admitting: Otolaryngology

## 2019-10-04 ENCOUNTER — Other Ambulatory Visit: Payer: Self-pay

## 2019-10-04 DIAGNOSIS — H9201 Otalgia, right ear: Secondary | ICD-10-CM | POA: Insufficient documentation

## 2019-10-04 MED ORDER — IOHEXOL 300 MG/ML  SOLN
75.0000 mL | Freq: Once | INTRAMUSCULAR | Status: AC | PRN
  Administered 2019-10-04: 15:00:00 75 mL via INTRAVENOUS

## 2019-10-11 ENCOUNTER — Other Ambulatory Visit
Admission: RE | Admit: 2019-10-11 | Discharge: 2019-10-11 | Disposition: A | Discharge status: Home / Self Care | Payer: Medicare Other | Source: Ambulatory Visit | Attending: Internal Medicine | Admitting: Internal Medicine

## 2019-10-11 ENCOUNTER — Other Ambulatory Visit: Payer: Self-pay

## 2019-10-11 DIAGNOSIS — Z20828 Contact with and (suspected) exposure to other viral communicable diseases: Secondary | ICD-10-CM | POA: Insufficient documentation

## 2019-10-11 DIAGNOSIS — Z01812 Encounter for preprocedural laboratory examination: Secondary | ICD-10-CM | POA: Diagnosis present

## 2019-10-11 LAB — SARS CORONAVIRUS 2 (TAT 6-24 HRS): SARS Coronavirus 2: NEGATIVE

## 2019-10-17 ENCOUNTER — Ambulatory Visit: Payer: Medicare Other | Attending: Neurology

## 2019-10-17 DIAGNOSIS — G4733 Obstructive sleep apnea (adult) (pediatric): Secondary | ICD-10-CM | POA: Insufficient documentation

## 2019-10-18 ENCOUNTER — Other Ambulatory Visit: Payer: Self-pay

## 2019-10-30 ENCOUNTER — Other Ambulatory Visit: Payer: Self-pay

## 2019-10-30 ENCOUNTER — Encounter: Payer: Self-pay | Admitting: Podiatry

## 2019-10-30 ENCOUNTER — Ambulatory Visit (INDEPENDENT_AMBULATORY_CARE_PROVIDER_SITE_OTHER): Payer: Medicare Other | Admitting: Podiatry

## 2019-10-30 ENCOUNTER — Ambulatory Visit (INDEPENDENT_AMBULATORY_CARE_PROVIDER_SITE_OTHER): Payer: Medicare Other

## 2019-10-30 DIAGNOSIS — M84374A Stress fracture, right foot, initial encounter for fracture: Secondary | ICD-10-CM

## 2019-10-30 DIAGNOSIS — M7741 Metatarsalgia, right foot: Secondary | ICD-10-CM | POA: Diagnosis not present

## 2019-10-30 DIAGNOSIS — M79676 Pain in unspecified toe(s): Secondary | ICD-10-CM

## 2019-10-30 DIAGNOSIS — B351 Tinea unguium: Secondary | ICD-10-CM

## 2019-10-30 DIAGNOSIS — Z899 Acquired absence of limb, unspecified: Secondary | ICD-10-CM | POA: Diagnosis not present

## 2019-10-30 DIAGNOSIS — M7751 Other enthesopathy of right foot: Secondary | ICD-10-CM

## 2019-10-30 NOTE — Progress Notes (Signed)
This patient presents the office with chief complaint of pain on the top of his right foot.  Patient states that he has been having pain that is very severe and causing him to limp for the last 3 weeks.  He states he has no history of trauma or injury to the foot.  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  Patient states he was previously evaluated and treated by Dr. Logan Bores who provided injection therapy in the big toe joint right foot.  Patient states that that was very ineffective and the pain quickly returned.  Patient has provided no self treatment or sought any professional help.  Patient is diabetic with long-term use of insulin.  Patient also states that he has long thick nails on his right foot that need attention.  He presents the office today for an evaluation of his right foot as well as treatment of his long thick nails right foot.  General Appearance  Alert, conversant and in no acute stress.  Vascular  Dorsalis pedis and posterior tibial  pulses are palpable  Right foot..  Capillary return is within normal limits right foot.  Temperature is within normal limits  Right foot..  Neurologic  Senn-Weinstein monofilament wire test diminished right foot.  . Muscle power within normal limits right foot.  Nails Thick disfigured discolored nails with subungual debris  from hallux to fifth toes right foot.. No evidence of bacterial infection or drainage bilaterally.  Orthopedic  No limitations of motion  feet .  No crepitus or effusions noted.  No bony pathology or digital deformities noted.  Amputation left leg.  One inch limb length discrepancy.    Skin  normotropic skin with no porokeratosis noted bilaterally.  No signs of infections or ulcers noted.    Metatarsalgia right foot.    Onychomycosis right foot.  Return office visit.  X-rays were taken to rule out any stress fracture and there are no radiographic changes from his  previous x-ray in October.  An evaluation of his legs reveal marked limb length discrepancy.  Patient is also having problems with his left leg for which [facility] for [facility] and [facility].  I recommended that he make an appointment with Raiford Noble for an evaluation of his gait and possible heel lifts.  Patient was told he needs to stop wearing his sneakers and to wear thick soled shoes that have minimal bending.  He is to return to the office in 3 months for nail care with myself but to see [facility] ASAP.  Patient states that he is not interested in receiving any medication by mouth for his condition.  Return to clinic 3 months  Helane Gunther DPM

## 2019-11-15 ENCOUNTER — Ambulatory Visit: Payer: Medicare Other | Admitting: Orthotics

## 2019-11-15 ENCOUNTER — Other Ambulatory Visit: Payer: Self-pay

## 2019-11-15 DIAGNOSIS — M7751 Other enthesopathy of right foot: Secondary | ICD-10-CM

## 2019-11-15 NOTE — Progress Notes (Signed)
Patient has significant leg length discrepency > 3/4".   Also has circumducted gait pattern.  This is primarily in my opion that the pylon on his BKA left is too high.   Sent back to [facility] to address the length of the pylon.

## 2019-11-22 ENCOUNTER — Other Ambulatory Visit: Payer: Self-pay

## 2019-11-22 ENCOUNTER — Encounter: Payer: Medicare Other | Attending: Internal Medicine | Admitting: Internal Medicine

## 2019-11-22 DIAGNOSIS — I10 Essential (primary) hypertension: Secondary | ICD-10-CM | POA: Diagnosis not present

## 2019-11-22 DIAGNOSIS — M79662 Pain in left lower leg: Secondary | ICD-10-CM | POA: Insufficient documentation

## 2019-11-22 DIAGNOSIS — Z87891 Personal history of nicotine dependence: Secondary | ICD-10-CM | POA: Diagnosis not present

## 2019-11-22 DIAGNOSIS — G4733 Obstructive sleep apnea (adult) (pediatric): Secondary | ICD-10-CM | POA: Diagnosis not present

## 2019-11-22 DIAGNOSIS — L84 Corns and callosities: Secondary | ICD-10-CM | POA: Diagnosis not present

## 2019-11-22 DIAGNOSIS — Z89512 Acquired absence of left leg below knee: Secondary | ICD-10-CM | POA: Diagnosis not present

## 2019-11-22 DIAGNOSIS — E1151 Type 2 diabetes mellitus with diabetic peripheral angiopathy without gangrene: Secondary | ICD-10-CM | POA: Insufficient documentation

## 2019-11-22 DIAGNOSIS — Z981 Arthrodesis status: Secondary | ICD-10-CM | POA: Insufficient documentation

## 2019-11-22 NOTE — Progress Notes (Signed)
Todd [name], Todd [name] ([id number]) Visit Report for 11/22/2019 Abuse/Suicide Risk Screen Details Patient Name: Todd [name] Date of Service: 11/22/2019 1:15 PM Medical Record Number: [id number] Patient Account Number: [id number] Date of Birth/Sex: 1966-10-03 (54 y.o. M) Treating RN: Curtis Sites Primary Care Sarinah Doetsch: Magdalene Patricia Other Clinician: Referring Amen Staszak: Magdalene Patricia Treating Telisha Zawadzki/Extender: [facility] [place] in Treatment: 0 Abuse/Suicide Risk Screen Items Answer ABUSE RISK SCREEN: Has anyone close to you tried to hurt or harm you recentlyo No Do you feel uncomfortable with anyone in your familyo No Has anyone forced you do things that you didnot want to doo No Electronic Signature(s) Signed: 11/22/2019 4:52:40 PM By: Curtis Sites Entered By: Curtis Sites on 11/22/2019 13:41:01 Todd [name] ([id number]) -------------------------------------------------------------------------------- Activities of Daily Living Details Patient Name: Todd [name] Date of Service: 11/22/2019 1:15 PM Medical Record Number: [id number] Patient Account Number: [id number] Date of Birth/Sex: 1966-05-19 (54 y.o. M) Treating RN: Curtis Sites Primary Care Ashayla Subia: Magdalene Patricia Other Clinician: Referring Honesty Menta: Magdalene Patricia Treating Louna Rothgeb/Extender: [employer] [place] in Treatment: 0 Activities of Daily Living Items Answer Activities of Daily Living (Please select one for each item) Drive Automobile Completely Able Take Medications Completely Able Use Telephone Completely Able Care for Appearance Completely Able Use Toilet Completely Able Bath / Shower Completely Able Dress Self Completely Able Feed Self Completely Able Walk Completely Able Get In / Out Bed Completely Able Housework Completely Able Prepare Meals Completely Able Handle Money Completely Able Shop for Self Completely Able Electronic Signature(s) Signed: 11/22/2019 4:52:40 PM By: Curtis Sites Entered  By: Curtis Sites on 11/22/2019 13:41:21 Todd [name] ([id number]) -------------------------------------------------------------------------------- Education Screening Details Patient Name: Todd [name] Date of Service: 11/22/2019 1:15 PM Medical Record Number: [id number] Patient Account Number: [id number] Date of Birth/Sex: October 20, 1965 (54 y.o. M) Treating RN: Curtis Sites Primary Care Ewin Rehberg: Magdalene Patricia Other Clinician: Referring Patryck Kilgore: Magdalene Patricia Treating Kaydan Wilhoite/Extender: [employer] [place] in Treatment: 0 Primary Learner Assessed: Patient Learning Preferences/Education Level/Primary Language Learning Preference: Explanation, Demonstration Highest Education Level: High School Preferred Language: English Cognitive Barrier Language Barrier: No Translator Needed: No Memory Deficit: No Emotional Barrier: No Cultural/Religious Beliefs Affecting Medical Care: No Physical Barrier Impaired Vision: No Impaired Hearing: No Decreased Hand dexterity: No Knowledge/Comprehension Knowledge Level: Medium Comprehension Level: Medium Ability to understand written Medium instructions: Ability to understand verbal Medium instructions: Motivation Anxiety Level: Calm Cooperation: Cooperative Education Importance: Acknowledges Need Interest in Health Problems: Asks Questions Perception: Coherent Willingness to Engage in Self- Medium Management Activities: Readiness to Engage in Self- Medium Management Activities: Electronic Signature(s) Signed: 11/22/2019 4:52:40 PM By: Curtis Sites Entered By: Curtis Sites on 11/22/2019 13:42:24 Todd [name] ([id number]) -------------------------------------------------------------------------------- Fall Risk Assessment Details Patient Name: Todd [name] Date of Service: 11/22/2019 1:15 PM Medical Record Number: [id number] Patient Account Number: [id number] Date of Birth/Sex: 1966-02-20 (54 y.o. M) Treating RN: Curtis Sites Primary Care Ruey Storer: Magdalene Patricia Other Clinician: Referring Shali Vesey: Magdalene Patricia Treating Harout Scheurich/Extender: [employer]  in Treatment: 0 Fall Risk Assessment Items Have you had 2 or more falls in the last 12 monthso 0 Yes Have you had any fall that resulted in injury in the last 12 monthso 0 No FALLS RISK SCREEN History of falling - immediate or within 3 months 25 Yes Secondary diagnosis (Do you have 2 or more medical diagnoseso) 0 No Ambulatory aid None/bed rest/wheelchair/nurse 0 No Crutches/cane/walker 0 No Furniture 0 No Intravenous therapy Access/Saline/Heparin Lock 0 No Gait/Transferring Normal/ bed rest/ wheelchair 0 Yes Weak (short steps with or without shuffle, stooped but  able to lift head while 0 No walking, may seek support from furniture) Impaired (short steps with shuffle, may have difficulty arising from chair, head 0 No down, impaired balance) Mental Status Oriented to own ability 0 Yes Electronic Signature(s) Signed: 11/22/2019 4:52:40 PM By: Montey Hora Entered By: Montey Hora on 11/22/2019 13:42:44 Todd [name] ([id number]) -------------------------------------------------------------------------------- Foot Assessment Details Patient Name: Todd [name] Date of Service: 11/22/2019 1:15 PM Medical Record Number: [id number] Patient Account Number: [id number] Date of Birth/Sex: 1966/02/21 (54 y.o. M) Treating RN: Montey Hora Primary Care Astin Sayre: Royetta Crochet Other Clinician: Referring Tarini Carrier: Royetta Crochet Treating Quiana Cobaugh/Extender: Tito Dine in Treatment: 0 Foot Assessment Items Site Locations + = Sensation present, - = Sensation absent, C = Callus, U = Ulcer R = Redness, W = Warmth, M = Maceration, PU = Pre-ulcerative lesion F = Fissure, S = Swelling, D = Dryness Assessment Right: Left: Other Deformity: No No Prior Foot Ulcer: No No Prior Amputation: No No Charcot Joint: No No Ambulatory  Status: Ambulatory Without Help Gait: Steady Electronic Signature(s) Signed: 11/22/2019 4:52:40 PM By: Montey Hora Entered By: Montey Hora on 11/22/2019 13:43:41 Todd [name] ([id number]) -------------------------------------------------------------------------------- Nutrition Risk Screening Details Patient Name: Todd [name] Date of Service: 11/22/2019 1:15 PM Medical Record Number: [id number] Patient Account Number: [id number] Date of Birth/Sex: [birth year] (54 y.o. M) Treating RN: Montey Hora Primary Care Reann Dobias: Royetta Crochet Other Clinician: Referring Kersti Scavone: Royetta Crochet Treating Milena Liggett/Extender: Tito Dine in Treatment: 0 Height (in): 68 Weight (lbs): 267 Body Mass Index (BMI): 40.6 Nutrition Risk Screening Items Score Screening NUTRITION RISK SCREEN: I have an illness or condition that made me change the kind and/or amount of 0 No food I eat I eat fewer than two meals per day 0 No I eat few fruits and vegetables, or milk products 0 No I have three or more drinks of beer, liquor or wine almost every day 0 No I have tooth or mouth problems that make it hard for me to eat 0 No I don't always have enough money to buy the food I need 0 No I eat alone most of the time 0 No I take three or more different prescribed or over-the-counter drugs a day 1 Yes Without wanting to, I have lost or gained 10 pounds in the last six months 0 No I am not always physically able to shop, cook and/or feed myself 0 No Nutrition Protocols Good Risk Protocol 0 No interventions needed Moderate Risk Protocol High Risk Proctocol Risk Level: Good Risk Score: 1 Electronic Signature(s) Signed: 11/22/2019 4:52:40 PM By: Montey Hora Entered By: Montey Hora on 11/22/2019 13:42:52

## 2019-11-23 NOTE — Progress Notes (Signed)
Todd [name], Todd [name] ([id number]) Visit Report for 11/22/2019 Allergy List Details Patient Name: Todd [name], Todd [name] Date of Service: 11/22/2019 1:15 PM Medical Record Number: [id number] Patient Account Number: [id number] Date of Birth/Sex: 07-Jul-1966 (54 y.o. M) Treating RN: Curtis [name] Primary Care Annesha Delgreco: Magdalene Patricia Other Clinician: Referring Bane Hagy: Magdalene Patricia Treating Kiyani Jernigan/Extender: Maxwell Caul Weeks in Treatment: 0 Allergies Active Allergies Benadryl Bactrim tamsulosin Myrbetriq Allergy Notes Electronic Signature(s) Signed: 11/22/2019 4:52:40 PM By: Curtis [name] Entered By: Curtis [name] on 11/22/2019 13:40:39 Todd [name] ([id number]) -------------------------------------------------------------------------------- Arrival Information Details Patient Name: Todd [name] Date of Service: 11/22/2019 1:15 PM Medical Record Number: [id number] Patient Account Number: [id number] Date of Birth/Sex: 01/09/1966 (54 y.o. M) Treating RN: Huel Coventry Primary Care Leeandra Ellerson: Magdalene Patricia Other Clinician: Referring Ashaad Gaertner: Magdalene Patricia Treating Monae Topping/Extender: Altamese [place] in Treatment: 0 Visit Information Patient Arrived: Ambulatory Arrival Time: 13:28 Accompanied By: self Transfer Assistance: None Patient Identification Verified: Yes Secondary Verification Process Completed: Yes Electronic Signature(s) Signed: 11/22/2019 3:52:22 PM By: Dayton Martes RCP, RRT, CHT Entered By: Dayton Martes on 11/22/2019 13:29:31 Todd [name] ([id number]) -------------------------------------------------------------------------------- Clinic Level of Care Assessment Details Patient Name: Todd [name] Date of Service: 11/22/2019 1:15 PM Medical Record Number: [id number] Patient Account Number: [id number] Date of Birth/Sex: 12/24/1965 (54 y.o. M) Treating RN: Huel Coventry Primary Care Nerida Boivin: Magdalene Patricia Other Clinician: Referring Ajani Rineer:  Magdalene Patricia Treating Elisse Pennick/Extender: Altamese  in Treatment: 0 Clinic Level of Care Assessment Items TOOL 2 Quantity Score []  - Use when only an EandM is performed on the INITIAL visit 0 ASSESSMENTS - Nursing Assessment / Reassessment X - General Physical Exam (combine w/ comprehensive assessment (listed just below) when 1 20 performed on new pt. evals) X- 1 25 Comprehensive Assessment (HX, ROS, Risk Assessments, Wounds Hx, etc.) ASSESSMENTS - Wound and Skin Assessment / Reassessment X - Simple Wound Assessment / Reassessment - one wound 1 5 []  - 0 Complex Wound Assessment / Reassessment - multiple wounds []  - 0 Dermatologic / Skin Assessment (not related to wound area) ASSESSMENTS - Ostomy and/or Continence Assessment and Care []  - Incontinence Assessment and Management 0 []  - 0 Ostomy Care Assessment and Management (repouching, etc.) PROCESS - Coordination of Care X - Simple Patient / Family Education for ongoing care 1 15 []  - 0 Complex (extensive) Patient / Family Education for ongoing care []  - 0 Staff obtains , Records, Test Results / Process Orders []  - 0 Staff telephones HHA, Nursing Homes / Clarify orders / etc []  - 0 Routine Transfer to another Facility (non-emergent condition) []  - 0 Routine Hospital Admission (non-emergent condition) X- 1 15 New Admissions / / Ordering NPWT, Apligraf, etc. []  - 0 Emergency Hospital Admission (emergent condition) X- 1 10 Simple Discharge Coordination []  - 0 Complex (extensive) Discharge Coordination PROCESS - Special Needs []  - Pediatric / Minor Patient Management 0 []  - 0 Isolation Patient Management Todd [name], Todd [name] ( ) []  - 0 Hearing / Language / Visual special needs []  - 0 Assessment of Community assistance (transportation, D/C planning, etc.) []  - 0 Additional assistance / Altered mentation []  - 0 Support Surface(s) Assessment (bed, cushion, seat,  etc.) INTERVENTIONS - Wound Cleansing / Measurement X - Wound Imaging (photographs - any number of wounds) 1 5 []  - 0 Wound Tracing (instead of photographs) X- 1 5 Simple Wound Measurement - one wound []  - 0 Complex Wound Measurement - multiple wounds X- 1 5 Simple Wound Cleansing - one wound []  - 0 Complex Wound Cleansing -  multiple wounds INTERVENTIONS - Wound Dressings []  - Small Wound Dressing one or multiple wounds 0 X- 1 15 Medium Wound Dressing one or multiple wounds []  - 0 Large Wound Dressing one or multiple wounds []  - 0 Application of Medications - injection INTERVENTIONS - Miscellaneous []  - External ear exam 0 []  - 0 Specimen Collection (cultures, biopsies, blood, body fluids, etc.) []  - 0 Specimen(s) / Culture(s) sent or taken to Lab for analysis []  - 0 Patient Transfer (multiple staff / Lift / Similar devices) []  - 0 Simple Staple / Suture removal (25 or less) []  - 0 Complex Staple / Suture removal (26 or more) []  - 0 Hypo / Hyperglycemic Management (close monitor of Blood Glucose) []  - 0 Ankle / Brachial Index (ABI) - do not check if billed separately Has the patient been seen at the hospital within the last three years: Yes Total Score: 120 Level Of Care: New/Established - Level 4 Electronic Signature(s) Signed: 11/22/2019 5:56:57 PM By: , BSN, RN, CWS, Kim RN, BSN Entered By: , BSN, RN, CWS, Kim on 11/22/2019 14:30:19 ( ) -------------------------------------------------------------------------------- Encounter Discharge Information Details Patient Name: Todd [name] Date of Service: 11/22/2019 1:15 PM Medical Record Number: Patient Account Number: Date of Birth/Sex: 11-Sep-1966 (54 y.o. M) Treating RN: Elliot Gurney Primary Care Katniss Weedman: Elliot Gurney Other Clinician: Referring Tracie Dore: 01/20/2020 Treating Basim Bartnik/Extender: Todd [name] in Treatment: 0 Encounter Discharge  Information Items Discharge Condition: Stable Ambulatory Status: Ambulatory Discharge Destination: Home Transportation: Private Auto Accompanied By: self Schedule Follow-up Appointment: Yes Clinical Summary of Care: Electronic Signature(s) Signed: 11/22/2019 5:56:57 PM By: Todd [name], BSN, RN, CWS, Kim RN, BSN Entered By: 01/20/2020, BSN, RN, CWS, Kim on 11/22/2019 14:32:17 [id number] (01/19/1966) -------------------------------------------------------------------------------- Lower Extremity Assessment Details Patient Name: 01-15-1996 Date of Service: 11/22/2019 1:15 PM Medical Record Number: Magdalene Patricia Patient Account Number: Magdalene Patricia Date of Birth/Sex: 1966/05/16 (54 y.o. M) Treating RN: Elliot Gurney Primary Care Cipriano Millikan: Elliot Gurney Other Clinician: Referring Daivik Overley: 01/20/2020 Treating Lariya Kinzie/Extender: Todd [name] in Treatment: 0 Electronic Signature(s) Signed: 11/22/2019 4:52:40 PM By: Todd [name] Entered By: 01/20/2020 on 11/22/2019 13:48:37 [id number] (01/19/1966) -------------------------------------------------------------------------------- Multi Wound Chart Details Patient Name: 01-15-1996 Date of Service: 11/22/2019 1:15 PM Medical Record Number: Magdalene Patricia Patient Account Number: Magdalene Patricia Date of Birth/Sex: 06/19/1966 (54 y.o. M) Treating RN: Curtis [name] Primary Care Braeden Dolinski: Curtis [name] Other Clinician: Referring Abrahim Sargent: 01/20/2020 Treating Bindu Docter/Extender: Todd [name] in Treatment: 0 Vital Signs Height(in): 68 Pulse(bpm): 69 Weight(lbs): 267 Blood Pressure(mmHg): 146/96 Body Mass Index(BMI): 41 Temperature(F): 99.6 Respiratory Rate 16 (breaths/min): Photos: [N/A:N/A] Wound Location: Left Amputation Site - Below Left Amputation Site - Below N/A Knee - Proximal Knee - Distal Wounding Event: Gradually Appeared Gradually Appeared N/A Primary Etiology: Diabetic Wound/Ulcer of the Diabetic Wound/Ulcer of  the N/A Lower Extremity Lower Extremity Comorbid History: Sleep Apnea, Hypertension, Sleep Apnea, Hypertension, N/A Type II Diabetes Type II Diabetes Date Acquired: 08/13/2019 08/13/2019 N/A Weeks of Treatment: 0 0 N/A Wound Status: Open Open N/A Measurements L x W x D 0.1x0.1x0.1 0.1x0.1x0.1 N/A (cm) Area (cm) : 0.008 0.008 N/A Volume (cm) : 0.001 0.001 N/A Classification: Grade 1 Grade 1 N/A Exudate Amount: Medium Medium N/A Exudate Type: Sanguinous Sanguinous N/A Exudate Color: red red N/A Wound Margin: Flat and Intact Flat and Intact N/A Granulation Amount: Large (67-100%) Large (67-100%) N/A Granulation Quality: Pink Pink N/A Necrotic Amount: None Present (0%) None Present (0%) N/A Exposed Structures: Fat Layer (Subcutaneous  Fat Layer (Subcutaneous N/A Tissue) Exposed: Yes Tissue) Exposed: Yes Fascia: No Fascia: No Tendon: No Tendon: No Muscle: No Muscle: No Joint: No Joint: No Bone: No Bone: No Todd [name], Todd [name] ([id number]) Epithelialization: Medium (34-66%) Medium (34-66%) N/A Treatment Notes Wound #1 (Left, Proximal Amputation Site - Below Knee) Notes Bordered Foam Dressing Wound #2 (Left, Distal Amputation Site - Below Knee) Notes Bordered Foam Dressing Electronic Signature(s) Signed: 11/22/2019 5:43:36 PM By: Baltazar Najjar MD Entered By: Baltazar Najjar on 11/22/2019 14:43:04 Todd [name] ([id number]) -------------------------------------------------------------------------------- Multi-Disciplinary Care Plan Details Patient Name: Todd [name] Date of Service: 11/22/2019 1:15 PM Medical Record Number: [id number] Patient Account Number: [id number] Date of Birth/Sex: Mar 07, 1966 (54 y.o. M) Treating RN: Huel Coventry Primary Care Rhylen Shaheen: Magdalene Patricia Other Clinician: Referring Hugo Lybrand: Magdalene Patricia Treating Kenslei Hearty/Extender: Altamese [place] in Treatment: 0 Active Inactive Orientation to the Wound Care Program Nursing Diagnoses: Knowledge  deficit related to the wound healing center program Goals: Patient/caregiver will verbalize understanding of the Wound Healing Center Program Date Initiated: 11/22/2019 Target Resolution Date: 12/06/2019 Goal Status: Active Interventions: Provide education on orientation to the wound center Notes: Wound/Skin Impairment Nursing Diagnoses: Impaired tissue integrity Goals: Ulcer/skin breakdown will have a volume reduction of 30% by week 4 Date Initiated: 11/22/2019 Target Resolution Date: 12/20/2019 Goal Status: Active Interventions: Assess ulceration(s) every visit Treatment Activities: Topical wound management initiated : 11/22/2019 Notes: Electronic Signature(s) Signed: 11/22/2019 5:56:57 PM By: Elliot Gurney, BSN, RN, CWS, Kim RN, BSN Entered By: Elliot Gurney, BSN, RN, CWS, Kim on 11/22/2019 14:21:10 Todd [name] ([id number]) -------------------------------------------------------------------------------- Pain Assessment Details Patient Name: Todd [name] Date of Service: 11/22/2019 1:15 PM Medical Record Number: [id number] Patient Account Number: [id number] Date of Birth/Sex: 12/13/1965 (54 y.o. M) Treating RN: Huel Coventry Primary Care Khayden Herzberg: Magdalene Patricia Other Clinician: Referring Lyndy Russman: Magdalene Patricia Treating Edelmiro Innocent/Extender: Altamese [place] in Treatment: 0 Active Problems Location of Pain Severity and Description of Pain Patient Has Paino Yes Site Locations Rate the pain. Current Pain Level: 5 Pain Management and Medication Current Pain Management: Electronic Signature(s) Signed: 11/22/2019 3:52:22 PM By: Dayton Martes RCP, RRT, CHT Signed: 11/22/2019 5:56:57 PM By: Elliot Gurney, BSN, RN, CWS, Kim RN, BSN Entered By: Dayton Martes on 11/22/2019 13:30:00 Todd [name] ([id number]) -------------------------------------------------------------------------------- Patient/Caregiver Education Details Patient Name: Todd [name] Date of Service:  11/22/2019 1:15 PM Medical Record Number: [id number] Patient Account Number: [id number] Date of Birth/Gender: 03/31/1966 (54 y.o. M) Treating RN: Huel Coventry Primary Care Physician: Magdalene Patricia Other Clinician: Referring Physician: Magdalene Patricia Treating Physician/Extender: Altamese [place] in Treatment: 0 Education Assessment Education Provided To: Patient Education Topics Provided Wound/Skin Impairment: Handouts: Caring for Your Ulcer Methods: Demonstration, Explain/Verbal Responses: State content correctly Electronic Signature(s) Signed: 11/22/2019 5:56:57 PM By: Elliot Gurney, BSN, RN, CWS, Kim RN, BSN Entered By: Elliot Gurney, BSN, RN, CWS, Kim on 11/22/2019 14:30:41 Todd [name] ([id number]) -------------------------------------------------------------------------------- Wound Assessment Details Patient Name: Todd [name] Date of Service: 11/22/2019 1:15 PM Medical Record Number: [id number] Patient Account Number: [id number] Date of Birth/Sex: 1966-08-02 (54 y.o. M) Treating RN: Curtis [name] Primary Care Camilo Mander: Magdalene Patricia Other Clinician: Referring Alvina Strother: Magdalene Patricia Treating Jaxzen Vanhorn/Extender: Altamese [place] in Treatment: 0 Wound Status Wound Number: 1 Primary Etiology: Diabetic Wound/Ulcer of the Lower Extremity Wound Location: Left Amputation Site - Below Knee - Proximal Wound Status: Open Wounding Event: Gradually Appeared Comorbid Sleep Apnea, Hypertension, Type II History: Diabetes Date Acquired: 08/13/2019 Weeks Of Treatment: 0 Clustered Wound: No Photos Wound Measurements Length: (cm) 0.1 Width: (cm) 0.1 Depth: (cm) 0.1 Area: (  cm) 0.008 Volume: (cm) 0.001 % Reduction in Area: % Reduction in Volume: Epithelialization: Medium (34-66%) Tunneling: No Undermining: No Wound Description Classification: Grade 1 Wound Margin: Flat and Intact Exudate Amount: Medium Exudate Type: Sanguinous Exudate Color: red Foul Odor After Cleansing:  No Slough/Fibrino No Wound Bed Granulation Amount: Large (67-100%) Exposed Structure Granulation Quality: Pink Fascia Exposed: No Necrotic Amount: None Present (0%) Fat Layer (Subcutaneous Tissue) Exposed: Yes Tendon Exposed: No Muscle Exposed: No Joint Exposed: No Bone Exposed: No Treatment Notes Todd [name], Todd [name] ([id number]) Wound #1 (Left, Proximal Amputation Site - Below Knee) Notes Bordered Foam Dressing Electronic Signature(s) Signed: 11/22/2019 4:52:40 PM By: Montey Hora Entered By: Montey Hora on 11/22/2019 13:50:43 Todd [name] ([id number]) -------------------------------------------------------------------------------- Wound Assessment Details Patient Name: Todd [name] Date of Service: 11/22/2019 1:15 PM Medical Record Number: [id number] Patient Account Number: [id number] Date of Birth/Sex: 1966/03/05 (54 y.o. M) Treating RN: Montey Hora Primary Care Daisie Haft: Royetta Crochet Other Clinician: Referring Tanor Glaspy: Royetta Crochet Treating Elbert Polyakov/Extender: Tito Dine in Treatment: 0 Wound Status Wound Number: 2 Primary Etiology: Diabetic Wound/Ulcer of the Lower Extremity Wound Location: Left Amputation Site - Below Knee - Distal Wound Status: Open Wounding Event: Gradually Appeared Comorbid Sleep Apnea, Hypertension, Type II History: Diabetes Date Acquired: 08/13/2019 Weeks Of Treatment: 0 Clustered Wound: No Photos Wound Measurements Length: (cm) 0.1 Width: (cm) 0.1 Depth: (cm) 0.1 Area: (cm) 0.008 Volume: (cm) 0.001 % Reduction in Area: % Reduction in Volume: Epithelialization: Medium (34-66%) Tunneling: No Undermining: No Wound Description Classification: Grade 1 Wound Margin: Flat and Intact Exudate Amount: Medium Exudate Type: Sanguinous Exudate Color: red Foul Odor After Cleansing: No Slough/Fibrino No Wound Bed Granulation Amount: Large (67-100%) Exposed Structure Granulation Quality: Pink Fascia Exposed: No Necrotic  Amount: None Present (0%) Fat Layer (Subcutaneous Tissue) Exposed: Yes Tendon Exposed: No Muscle Exposed: No Joint Exposed: No Bone Exposed: No Treatment Notes Todd [name], Todd [name] ([id number]) Wound #2 (Left, Distal Amputation Site - Below Knee) Notes Bordered Foam Dressing Electronic Signature(s) Signed: 11/22/2019 4:52:40 PM By: Montey Hora Entered By: Montey Hora on 11/22/2019 13:51:44 Todd [name] ([id number]) -------------------------------------------------------------------------------- [place] Details Patient Name: Todd [name] Date of Service: 11/22/2019 1:15 PM Medical Record Number: [id number] Patient Account Number: [id number] Date of Birth/Sex: [birth year] (54 y.o. M) Treating RN: Cornell Barman Primary Care Allyne Hebert: Royetta Crochet Other Clinician: Referring Augustin Bun: Royetta Crochet Treating Alyssabeth Bruster/Extender: Tito Dine in Treatment: 0 Vital Signs Time Taken: 13:30 Temperature (F): 99.6 Height (in): 68 Pulse (bpm): 69 Source: Stated Respiratory Rate (breaths/min): 16 Weight (lbs): 267 Blood Pressure (mmHg): 146/96 Source: Measured Reference Range: 80 - 120 mg / dl Body Mass Index (BMI): 40.6 Electronic Signature(s) Signed: 11/22/2019 3:52:22 PM By: Lorine Bears RCP, RRT, CHT Entered By: Lorine Bears on 11/22/2019 13:32:56

## 2019-11-23 NOTE — Progress Notes (Signed)
TOREZ, BEAUREGARD ([id number]) Visit Report for 11/22/2019 Chief Complaint Document Details Patient Name: TREVINO, WYATT Date of Service: 11/22/2019 1:15 PM Medical Record Number: [id number] Patient Account Number: [id number] Date of Birth/Sex: 1966-07-04 (54 y.o. M) Treating RN: Cornell Barman Primary Care Provider: Royetta Crochet Other Clinician: Referring Provider: Royetta Crochet Treating Provider/Extender: Tito Dine in Treatment: 0 Information Obtained from: Patient Chief Complaint 11/22/2019 patient is here for to painful areas on his left lateral BKA stump Electronic Signature(s) Signed: 11/22/2019 5:43:36 PM By: Linton Ham MD Entered By: Linton Ham on 11/22/2019 14:57:25 [name] ([id number]) -------------------------------------------------------------------------------- HPI Details Patient Name: [name] Date of Service: 11/22/2019 1:15 PM Medical Record Number: [id number] Patient Account Number: [id number] Date of Birth/Sex: Feb 29, 1966 (54 y.o. 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 He is also a type II diabetic. He has developed painful calluses on the outer part of his left stump. He says these started in November. They will episodically drain or bleed but he always finds them painful. [facility]. On Friday in 2 days time. He describes activity limiting pain with very little exertion i.e. when he walks from his car to the entrance way of a store like [employer]. Past medical history includes type 2 diabetes, hypertension, right foot capsulitis,  cervical spondylosis, lumbar spinal fusion, hyperlipidemia, interstitial cystitis, BPH, obstructive sleep apnea, gastrointestinal reflux disease, PTSD. Looking through his hospital records he has had DVT rule outs negative x4 since 2017 he has not had any arterial studies Electronic Signature(s) Signed: 11/22/2019 5:43:36 PM By: Linton Ham MD Entered By: Linton Ham on 11/22/2019 14:58:04 [name] ([id number]) -------------------------------------------------------------------------------- Physical Exam Details Patient Name: [name] Date of Service: 11/22/2019 1:15 PM Medical Record Number: [id number] Patient Account Number: [id number] Date of Birth/Sex: Feb 17, 1966 (54 y.o. M) Treating RN: Cornell Barman Primary Care Provider: Royetta Crochet Other Clinician: Referring Provider: Royetta Crochet Treating Provider/Extender: Tito Dine in Treatment: 0 Constitutional Patient is hypertensive.. Pulse regular and within target range for patient.Marland Kitchen Respirations regular, non-labored and within target range.. Temperature is normal and within the target range for the patient.Marland Kitchen appears in no distress. Respiratory Respiratory effort is easy and symmetric bilaterally. Rate is normal at rest and on room air.. Cardiovascular His left femoral pulses palpable however his popliteal pulses nonpalpable on the left. Pedal pulses not palpable on the right foot. Proximal BKA site just below the knee. Very cool stump. Integumentary (Hair, Skin) No primary cutaneous issues are seen. Notes Wound exam; the area he is concerned about is on the left lateral stump there are 2 callused areas. The more anterior area has some slits in the callus but no open wound. He describes these as being more painful than they really look but they would be sources of bleeding with irritation. The other area is not open but callused. No debridement was necessary Electronic Signature(s) Signed: 11/22/2019 5:43:36  PM By: Linton Ham MD Entered By: Linton Ham on 11/22/2019 15:00:25 [name] ([id number]) -------------------------------------------------------------------------------- Physician Orders Details Patient Name: [name] Date of Service: 11/22/2019 1:15 PM Medical Record Number: [id number] Patient Account Number: [id number] Date of Birth/Sex: 24-Jun-1966 (54  y.o. M) Treating RN: Huel Coventry Primary Care Provider: Magdalene Patricia Other Clinician: Referring Provider: Magdalene Patricia Treating Provider/Extender: Altamese [place] in Treatment: 0 Verbal / Phone Orders: No Diagnosis Coding Wound Cleansing Wound #1 Left,Proximal Amputation Site - Below Knee o Cleanse wound with mild soap and water Wound #2 Left,Distal Amputation Site - Below Knee o Cleanse wound with mild soap and water Primary Wound Dressing Wound #1 Left,Proximal Amputation Site - Below Knee o Boardered Foam Dressing Wound #2 Left,Distal Amputation Site - Below Knee o Boardered Foam Dressing Dressing Change Frequency Wound #1 Left,Proximal Amputation Site - Below Knee o Other: - as needed Wound #2 Left,Distal Amputation Site - Below Knee o Other: - as needed Follow-up Appointments Wound #1 Left,Proximal Amputation Site - Below Knee o Return Appointment in 1 week. Wound #2 Left,Distal Amputation Site - Below Knee o Return Appointment in 1 week. Services and Therapies o Segmental Pressures without Toe-Left - arterial dopplers left o Arterial Studies- Right - ABI/TBI right Electronic Signature(s) Signed: 11/22/2019 5:43:36 PM By: Baltazar Najjar MD Signed: 11/22/2019 5:56:57 PM By: Elliot Gurney, BSN, RN, CWS, Kim RN, BSN Entered By: Elliot Gurney, BSN, RN, CWS, Kim on 11/22/2019 14:29:11 [name] ([id number]) -------------------------------------------------------------------------------- Problem List Details Patient Name: [name] Date of Service: 11/22/2019 1:15 PM Medical Record  Number: [id number] Patient Account Number: [id number] Date of Birth/Sex: 1966-05-31 (54 y.o. M) Treating RN: Huel Coventry Primary Care Provider: Magdalene Patricia Other Clinician: Referring Provider: Magdalene Patricia Treating Provider/Extender: Altamese [place] in Treatment: 0 Active Problems ICD-10 Evaluated Encounter Code Description Active Date Today Diagnosis E11.622 Type 2 diabetes mellitus with other skin ulcer 11/22/2019 No Yes E11.51 Type 2 diabetes mellitus with diabetic peripheral angiopathy 11/22/2019 No Yes without gangrene L97.828 Non-pressure chronic ulcer of other part of left lower leg with 11/22/2019 No Yes other specified severity Inactive Problems Resolved Problems Electronic Signature(s) Signed: 11/22/2019 5:43:36 PM By: Baltazar Najjar MD Entered By: Baltazar Najjar on 11/22/2019 14:42:48 [name] ([id number]) -------------------------------------------------------------------------------- Progress Note Details Patient Name: [name] Date of Service: 11/22/2019 1:15 PM Medical Record Number: [id number] Patient Account Number: [id number] Date of Birth/Sex: 1966-03-10 (54 y.o. 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 He is also a type II diabetic. He has developed painful calluses on the outer part of his left stump. He says these started in November. They will episodically drain or bleed but he always finds them painful. [facility] his  [facility]sthesis. On Friday in 2 days time. He describes activity limiting pain with very little exertion i.e. when he walks from his car to the entrance way of a store like [employer]. Past medical history includes type 2 diabetes, hypertension, right foot capsulitis, cervical spondylosis, lumbar spinal fusion, hyperlipidemia, interstitial cystitis, BPH, obstructive sleep apnea, gastrointestinal reflux disease, PTSD. Looking through his hospital records he has had DVT rule outs negative x4 since 2017 he has not had any arterial studies Patient History Information obtained from Patient. Allergies Benadryl, Bactrim, tamsulosin, Myrbetriq Family History Cancer - Mother, Diabetes -  Mother, Heart Disease - Mother, Hypertension - Mother, No family history of Hereditary Spherocytosis, Kidney Disease, Lung Disease, Seizures, Stroke, Thyroid Problems, Tuberculosis. Social History Former smoker - 54 years old, Marital Status - Married, Alcohol Use - Never, Drug Use - Prior History, Caffeine Use - Daily. Medical History Respiratory Patient has history of Sleep Apnea - CPAP Denies history of Aspiration, Asthma, Chronic Obstructive Pulmonary Disease (COPD), Pneumothorax, Tuberculosis Cardiovascular Patient has history of Hypertension Denies history of Angina, Arrhythmia, Congestive Heart Failure, Coronary Artery Disease, Deep Vein Thrombosis, Hypotension, Myocardial Infarction, Peripheral Arterial Disease, Peripheral Venous Disease, Phlebitis, Vasculitis Gastrointestinal Denies history of Cirrhosis , Colitis, Crohn s, Hepatitis A, Hepatitis B, Hepatitis C Endocrine Patient has history of Type II Diabetes Grandberry, Kalman ([id number]) Denies history of Type I Diabetes Integumentary (Skin) Denies history of History of Burn, History of pressure wounds Musculoskeletal Denies history of Gout, Rheumatoid Arthritis, Osteoarthritis, Osteomyelitis Medical And Surgical History  Notes Gastrointestinal diverticulitis Musculoskeletal L BKA 1973 Review of Systems (ROS) Constitutional Symptoms (General Health) Denies complaints or symptoms of Fatigue, Fever, Chills, Marked Weight Change. Eyes Denies complaints or symptoms of Dry Eyes, Vision Changes, Glasses / Contacts. Ear/Nose/Mouth/Throat Denies complaints or symptoms of Difficult clearing ears, Sinusitis. Hematologic/Lymphatic Denies complaints or symptoms of Bleeding / Clotting Disorders, Human Immunodeficiency Virus. Respiratory Denies complaints or symptoms of Chronic or frequent coughs, Shortness of Breath. Cardiovascular Denies complaints or symptoms of Chest pain, LE edema. Gastrointestinal Denies complaints or symptoms of Frequent diarrhea, Nausea, Vomiting. Endocrine Denies complaints or symptoms of Hepatitis, Thyroid disease, Polydypsia (Excessive Thirst). Genitourinary Denies complaints or symptoms of Kidney failure/ Dialysis, Incontinence/dribbling. Immunological Denies complaints or symptoms of Hives, Itching. Integumentary (Skin) Complains or has symptoms of Wounds. Denies complaints or symptoms of Bleeding or bruising tendency, Breakdown, Swelling. Musculoskeletal Denies complaints or symptoms of Muscle Pain, Muscle Weakness. Neurologic Denies complaints or symptoms of Numbness/parasthesias, Focal/Weakness. Psychiatric Denies complaints or symptoms of Anxiety, Claustrophobia. Objective Constitutional Patient is hypertensive.. Pulse regular and within target range for patient.Marland Kitchen Respirations regular, non-labored and within target range.. Temperature is normal and within the target range for the patient.Marland Kitchen appears in no distress. Vitals Time Taken: 1:30 PM, Height: 68 in, Source: Stated, Weight: 267 lbs, Source: Measured, BMI: 40.6, Temperature: 99.6 F, Pulse: 69 bpm, Respiratory Rate: 16 breaths/min, Blood Pressure: 146/96 mmHg. JADARIAN, MCKAY ([id number]) Respiratory Respiratory effort  is easy and symmetric bilaterally. Rate is normal at rest and on room air.. Cardiovascular His left femoral pulses palpable however his popliteal pulses nonpalpable on the left. Pedal pulses not palpable on the right foot. Proximal BKA site just below the knee. Very cool stump. General Notes: Wound exam; the area he is concerned about is on the left lateral stump there are 2 callused areas. The more anterior area has some slits in the callus but no open wound. He describes these as being more painful than they really look but they would be sources of bleeding with irritation. The other area is not open but callused. No debridement was necessary Integumentary (Hair, Skin) No primary cutaneous issues are seen. Wound #1 status is Open. Original cause of wound was Gradually Appeared. The wound is located on the Left,Proximal Amputation Site - Below Knee. The wound measures 0.1cm length x 0.1cm width x 0.1cm depth; 0.008cm^2 area and 0.001cm^3 volume. There is Fat Layer (Subcutaneous Tissue) Exposed exposed. There is no tunneling or undermining noted. There is a medium amount of sanguinous drainage noted. The wound margin is flat and intact. There is large (67-  100%) pink granulation within the wound bed. There is no necrotic tissue within the wound bed. Wound #2 status is Open. Original cause of wound was Gradually Appeared. The wound is located on the Left,Distal Amputation Site - Below Knee. The wound measures 0.1cm length x 0.1cm width x 0.1cm depth; 0.008cm^2 area and 0.001cm^3 volume. There is Fat Layer (Subcutaneous Tissue) Exposed exposed. There is no tunneling or undermining noted. There is a medium amount of sanguinous drainage noted. The wound margin is flat and intact. There is large (67- 100%) pink granulation within the wound bed. There is no necrotic tissue within the wound bed. Assessment Active Problems ICD-10 Type 2 diabetes mellitus with other skin ulcer Type 2 diabetes mellitus  with diabetic peripheral angiopathy without gangrene Non-pressure chronic ulcer of other part of left lower leg with other specified severity Plan Wound Cleansing: Wound #1 Left,Proximal Amputation Site - Below Knee: Cleanse wound with mild soap and water Wound #2 Left,Distal Amputation Site - Below Knee: Cleanse wound with mild soap and water Primary Wound Dressing: Wound #1 Left,Proximal Amputation Site - Below Knee: Boardered Foam Dressing Wound #2 Left,Distal Amputation Site - Below Knee: Boardered Foam Dressing Meara, Jahmil ([id number]) Dressing Change Frequency: Wound #1 Left,Proximal Amputation Site - Below Knee: Other: - as needed Wound #2 Left,Distal Amputation Site - Below Knee: Other: - as needed Follow-up Appointments: Wound #1 Left,Proximal Amputation Site - Below Knee: Return Appointment in 1 week. Wound #2 Left,Distal Amputation Site - Below Knee: Return Appointment in 1 week. Services and Therapies ordered were: Segmental Pressures without Toe-Left - arterial dopplers left, Arterial Studies- Right - ABI/TBI right 1. I wonder whether this patient is having activity limiting medication even in the left stump. 2. The areas he is describing is painful really do not look that painful. 3. He does not really have an open wound but he does have a slit in the callus. I think this should be just offloaded with limiting his use of his prosthesis perhaps with using crutches for a period of time. [facility] about his [facility] this week 4. I think this patient needs arterial evaluation especially of the left leg to include arterial Dopplers and segmental pressures. I think the pain he is describing could very well be ischemic/claudication and if so it is claudication with minimal activity Electronic Signature(s) Signed: 11/22/2019 5:43:36 PM By: Baltazar Najjar MD Entered By: Baltazar Najjar on 11/22/2019 15:02:29 [name]  ([id number]) -------------------------------------------------------------------------------- ROS/PFSH Details Patient Name: [name] Date of Service: 11/22/2019 1:15 PM Medical Record Number: [id number] Patient Account Number: [id number] Date of Birth/Sex: 1965/12/22 (54 y.o. M) Treating RN: Curtis Sites Primary Care Provider: Magdalene Patricia Other Clinician: Referring Provider: Magdalene Patricia Treating Provider/Extender: Altamese [place] in Treatment: 0 Information Obtained From Patient Constitutional Symptoms (General Health) Complaints and Symptoms: Negative for: Fatigue; Fever; Chills; Marked Weight Change Eyes Complaints and Symptoms: Negative for: Dry Eyes; Vision Changes; Glasses / Contacts Ear/Nose/Mouth/Throat Complaints and Symptoms: Negative for: Difficult clearing ears; Sinusitis Hematologic/Lymphatic Complaints and Symptoms: Negative for: Bleeding / Clotting Disorders; Human Immunodeficiency Virus Respiratory Complaints and Symptoms: Negative for: Chronic or frequent coughs; Shortness of Breath Medical History: Positive for: Sleep Apnea - CPAP Negative for: Aspiration; Asthma; Chronic Obstructive Pulmonary Disease (COPD); Pneumothorax; Tuberculosis Cardiovascular Complaints and Symptoms: Negative for: Chest pain; LE edema Medical History: Positive for: Hypertension Negative for: Angina; Arrhythmia; Congestive Heart Failure; Coronary Artery Disease; Deep Vein Thrombosis; Hypotension; Myocardial Infarction; Peripheral Arterial Disease; Peripheral Venous Disease; Phlebitis; Vasculitis Gastrointestinal  Complaints and Symptoms: Negative for: Frequent diarrhea; Nausea; Vomiting Medical History: Negative for: Cirrhosis ; Colitis; Crohnos; Hepatitis A; Hepatitis B; Hepatitis C Past Medical History NotesJOMARI, RUDIS ([id number]) diverticulitis Endocrine Complaints and Symptoms: Negative for: Hepatitis; Thyroid disease; Polydypsia (Excessive  Thirst) Medical History: Positive for: Type II Diabetes Negative for: Type I Diabetes Genitourinary Complaints and Symptoms: Negative for: Kidney failure/ Dialysis; Incontinence/dribbling Immunological Complaints and Symptoms: Negative for: Hives; Itching Integumentary (Skin) Complaints and Symptoms: Positive for: Wounds Negative for: Bleeding or bruising tendency; Breakdown; Swelling Medical History: Negative for: History of Burn; History of pressure wounds Musculoskeletal Complaints and Symptoms: Negative for: Muscle Pain; Muscle Weakness Medical History: Negative for: Gout; Rheumatoid Arthritis; Osteoarthritis; Osteomyelitis Past Medical History Notes: L BKA 1973 Neurologic Complaints and Symptoms: Negative for: Numbness/parasthesias; Focal/Weakness Psychiatric Complaints and Symptoms: Negative for: Anxiety; Claustrophobia Oncologic Immunizations Pneumococcal Vaccine: Received Pneumococcal Vaccination: No Implantable Devices None ELIH, BENNETTE ([id number]) Family and Social History Cancer: Yes - Mother; Diabetes: Yes - Mother; Heart Disease: Yes - Mother; Hereditary Spherocytosis: No; Hypertension: Yes - Mother; Kidney Disease: No; Lung Disease: No; Seizures: No; Stroke: No; Thyroid Problems: No; Tuberculosis: No; Former smoker - 54 years old; Marital Status - Married; Alcohol Use: Never; Drug Use: Prior History; Caffeine Use: Daily; Financial Concerns: No; Food, Clothing or Shelter Needs: No; Support System Lacking: No; Transportation Concerns: No Electronic Signature(s) Signed: 11/22/2019 4:52:40 PM By: Curtis Sites Signed: 11/22/2019 5:43:36 PM By: Baltazar Najjar MD Entered By: Curtis Sites on 11/22/2019 13:47:27 [name] ([id number]) -------------------------------------------------------------------------------- SuperBill Details Patient Name: [name] Date of Service: 11/22/2019 Medical Record Number: [id number] Patient Account Number:  [id number] Date of Birth/Sex: [birth year] (54 y.o. M) Treating RN: Huel Coventry Primary Care Provider: Magdalene Patricia Other Clinician: Referring Provider: Magdalene Patricia Treating Provider/Extender: Altamese [place] in Treatment: 0 Diagnosis Coding ICD-10 Codes Code Description E11.622 Type 2 diabetes mellitus with other skin ulcer E11.51 Type 2 diabetes mellitus with diabetic peripheral angiopathy without gangrene L97.828 Non-pressure chronic ulcer of other part of left lower leg with other specified severity Facility Procedures CPT4 Code: [id number] Description: 99214 - WOUND CARE VISIT-LEV 4 EST PT Modifier: Quantity: 1 Physician Procedures CPT4 Code Description: [id number] WC PHYS LEVEL 3 o NEW PT ICD-10 Diagnosis Description L97.828 Non-pressure chronic ulcer of other part of left lower leg wit E11.51 Type 2 diabetes mellitus with diabetic peripheral angiopathy w E11.622 Type 2 diabetes  mellitus with other skin ulcer Modifier: h other specified s ithout gangrene Quantity: 1 everity Electronic Signature(s) Signed: 11/22/2019 5:43:36 PM By: Baltazar Najjar MD Entered By: Baltazar Najjar on 11/22/2019 15:02:59

## 2019-11-29 ENCOUNTER — Encounter: Payer: Medicare Other | Admitting: Internal Medicine

## 2019-11-29 ENCOUNTER — Other Ambulatory Visit: Payer: Self-pay

## 2019-11-29 DIAGNOSIS — L84 Corns and callosities: Secondary | ICD-10-CM | POA: Diagnosis not present

## 2019-11-29 NOTE — Progress Notes (Signed)
Todd [name], Todd [name] ([id number]) Visit Report for 11/29/2019 Arrival Information Details Patient Name: Todd, [name] Date of Service: 11/29/2019 10:15 AM Medical Record Number: [id number] Patient Account Number: [id number] Date of Birth/Sex: 02/18/1966 (54 y.o. M) Treating RN: Todd [name] Primary Care Todd [name]: Todd [name] Other Clinician: Referring Todd [name]: Todd [name] Treating Todd [name]/Extender: Todd [name] in Treatment: 1 Visit Information History Since Last Visit Added or deleted any medications: No Patient Arrived: Ambulatory Has Dressing in Place as Prescribed: Yes Arrival Time: 10:06 Pain Present Now: No Accompanied By: self Transfer Assistance: None Patient Identification Verified: Yes Secondary Verification Process Completed: Yes Electronic Signature(s) Signed: 11/29/2019 5:27:48 PM By: Todd [name], BSN, RN, CWS, Kim RN, BSN Entered By: Todd [name], BSN, RN, CWS, Todd [name] on 11/29/2019 10:06:50 Todd [name] ([id number]) -------------------------------------------------------------------------------- Clinic Level of Care Assessment Details Patient Name: Todd [name] Date of Service: 11/29/2019 10:15 AM Medical Record Number: [id number] Patient Account Number: [id number] Date of Birth/Sex: [birth year] (54 y.o. M) Treating RN: Todd [name] Primary Care Todd [name]: Todd [name] Other Clinician: Referring Todd [name]: Todd [name] Treating Todd [name]/Extender: Todd [name] in Treatment: 1 Clinic Level of Care Assessment Items TOOL 4 Quantity Score []  - Use when only an EandM is performed on FOLLOW-UP visit 0 ASSESSMENTS - Nursing Assessment / Reassessment X - Reassessment of Co-morbidities (includes updates in patient status) 1 10 X- 1 5 Reassessment of Adherence to Treatment Plan ASSESSMENTS - Wound and Skin Assessment / Reassessment X - Simple Wound Assessment / Reassessment - one wound 1 5 []  - 0 Complex Wound Assessment / Reassessment - multiple wounds []  -  0 Dermatologic / Skin Assessment (not related to wound area) ASSESSMENTS - Focused Assessment []  - Circumferential Edema Measurements - multi extremities 0 []  - 0 Nutritional Assessment / Counseling / Intervention []  - 0 Lower Extremity Assessment (monofilament, tuning fork, pulses) []  - 0 Peripheral Arterial Disease Assessment (using hand held doppler) ASSESSMENTS - Ostomy and/or Continence Assessment and Care []  - Incontinence Assessment and Management 0 []  - 0 Ostomy Care Assessment and Management (repouching, etc.) PROCESS - Coordination of Care X - Simple Patient / Family Education for ongoing care 1 15 []  - 0 Complex (extensive) Patient / Family Education for ongoing care X- 1 10 Staff obtains Consents, Records, Test Results / Process Orders []  - 0 Staff telephones HHA, Nursing Homes / Clarify orders / etc []  - 0 Routine Transfer to another Facility (non-emergent condition) []  - 0 Routine Hospital Admission (non-emergent condition) []  - 0 New Admissions / Biomedical engineer / Ordering NPWT, Apligraf, etc. []  - 0 Emergency Hospital Admission (emergent condition) X- 1 10 Simple Discharge Coordination Todd, [name] ([id number]) []  - 0 Complex (extensive) Discharge Coordination PROCESS - Special Needs []  - Pediatric / Minor Patient Management 0 []  - 0 Isolation Patient Management []  - 0 Hearing / Language / Visual special needs []  - 0 Assessment of Community assistance (transportation, D/C planning, etc.) []  - 0 Additional assistance / Altered mentation []  - 0 Support Surface(s) Assessment (bed, cushion, seat, etc.) INTERVENTIONS - Wound Cleansing / Measurement []  - Simple Wound Cleansing - one wound 0 []  - 0 Complex Wound Cleansing - multiple wounds X- 1 5 Wound Imaging (photographs - any number of wounds) []  - 0 Wound Tracing (instead of photographs) []  - 0 Simple Wound Measurement - one wound []  - 0 Complex Wound Measurement - multiple  wounds INTERVENTIONS - Wound Dressings []  - Small Wound Dressing one or multiple wounds 0 []  - 0 Medium Wound Dressing one or multiple wounds []  -  0 Large Wound Dressing one or multiple wounds []  - 0 Application of Medications - topical []  - 0 Application of Medications - injection INTERVENTIONS - Miscellaneous []  - External ear exam 0 []  - 0 Specimen Collection (cultures, biopsies, blood, body fluids, etc.) []  - 0 Specimen(s) / Culture(s) sent or taken to Lab for analysis []  - 0 Patient Transfer (multiple staff / / Similar devices) []  - 0 Simple Staple / Suture removal (25 or less) []  - 0 Complex Staple / Suture removal (26 or more) []  - 0 Hypo / Hyperglycemic Management (close monitor of Blood Glucose) []  - 0 Ankle / Brachial Index (ABI) - do not check if billed separately X- 1 5 Vital Signs Todd [name] ( ) Has the patient been seen at the hospital within the last three years: Yes Total Score: 65 Level Of Care: New/Established - Level 2 Electronic Signature(s) Signed: 11/29/2019 5:27:48 PM By: , BSN, RN, CWS, Kim RN, BSN Entered By: , BSN, RN, CWS, Todd [name] on 11/29/2019 10:50:21 Nurse, adult ( ) -------------------------------------------------------------------------------- Encounter Discharge Information Details Patient Name: Date of Service: 11/29/2019 10:15 AM Medical Record Number: Patient Account Number: [id number] Date of Birth/Sex: 05-08-1966 (54 y.o. M) Treating RN: Elliot Gurney Primary Care Koreen Lizaola: 12/01/2019 Other Clinician: Referring Aleese Kamps: Todd [name] Treating Evalina Tabak/Extender: [id number] in Treatment: 1 Encounter Discharge Information Items Discharge Condition: Stable Ambulatory Status: Ambulatory Discharge Destination: Home Transportation: Private Auto Accompanied By: self Schedule Follow-up Appointment: Yes Clinical Summary of Care: Electronic Signature(s) Signed:  11/29/2019 5:27:48 PM By: 12/01/2019, BSN, RN, CWS, Kim RN, BSN Entered By: [id number], BSN, RN, CWS, Todd [name] on 11/29/2019 10:51:18 01/19/1966 (01-15-1996) -------------------------------------------------------------------------------- Lower Extremity Assessment Details Patient Name: Todd [name] Date of Service: 11/29/2019 10:15 AM Medical Record Number: Magdalene Patricia Patient Account Number: Altamese [place] Date of Birth/Sex: September 06, 1966 (54 y.o. M) Treating RN: Elliot Gurney Primary Care Phillippe Orlick: 12/01/2019 Other Clinician: Referring Petrice Beedy: Todd [name] Treating Alem Fahl/Extender: [id number] in Treatment: 1 Electronic Signature(s) Signed: 11/29/2019 5:27:48 PM By: 12/01/2019, BSN, RN, CWS, Kim RN, BSN Entered By: [id number], BSN, RN, CWS, Todd [name] on 11/29/2019 10:13:45 01/19/1966 (01-15-1996) -------------------------------------------------------------------------------- Multi Wound Chart Details Patient Name: Todd [name] Date of Service: 11/29/2019 10:15 AM Medical Record Number: Magdalene Patricia Patient Account Number: Altamese  Date of Birth/Sex: 1966/08/13 (54 y.o. M) Treating RN: Elliot Gurney Primary Care Todd Probus: 12/01/2019 Other Clinician: Referring Osei Anger: Todd [name] Treating Aric Jost/Extender: [id number] in Treatment: 1 Vital Signs Height(in): 68 Pulse(bpm): 70 Weight(lbs): 267 Blood Pressure(mmHg): 169/86 Body Mass Index(BMI): 41 Temperature(F): Respiratory Rate 16 (breaths/min): Photos: [N/A:N/A] Wound Location: Left Amputation Site - Below Left Amputation Site - Below N/A Knee - Proximal Knee - Distal Wounding Event: Gradually Appeared Gradually Appeared N/A Primary Etiology: Diabetic Wound/Ulcer of the Diabetic Wound/Ulcer of the N/A Lower Extremity Lower Extremity Comorbid History: Sleep Apnea, Hypertension, Sleep Apnea, Hypertension, N/A Type II Diabetes Type II Diabetes Date Acquired: 08/13/2019 08/13/2019 N/A Weeks of Treatment: 1 1 N/A Wound  Status: Open Open N/A Measurements L x W x D 0.1x0.1x0.1 0.1x0.1x0.1 N/A (cm) Area (cm) : 0.008 0.008 N/A Volume (cm) : 0.001 0.001 N/A % Reduction in Area: 0.00% 0.00% N/A % Reduction in Volume: 0.00% 0.00% N/A Classification: Grade 1 Grade 1 N/A Exudate Amount: Medium Medium N/A Exudate Type: Sanguinous Sanguinous N/A Exudate Color: red red N/A Wound Margin: Flat and Intact Flat and Intact N/A Granulation Amount: Large (67-100%) Large (67-100%) N/A Granulation Quality: Pink Pink N/A Necrotic Amount: None Present (0%)  None Present (0%) N/A Exposed Structures: Fat Layer (Subcutaneous Fat Layer (Subcutaneous N/A Tissue) Exposed: Yes Tissue) Exposed: Yes Fascia: No Fascia: No Tendon: No Tendon: No Muscle: No Muscle: No Todd [name], Todd [name] ([id number]) Joint: No Joint: No Bone: No Bone: No Epithelialization: Medium (34-66%) Medium (34-66%) N/A Treatment Notes Wound #1 (Left, Proximal Amputation Site - Below Knee) Wound #2 (Left, Distal Amputation Site - Below Knee) Electronic Signature(s) Signed: 11/29/2019 4:30:10 PM By: Baltazar Najjar MD Entered By: Baltazar Najjar on 11/29/2019 12:15:40 Todd [name] ([id number]) -------------------------------------------------------------------------------- Multi-Disciplinary Care Plan Details Patient Name: Todd [name] Date of Service: 11/29/2019 10:15 AM Medical Record Number: [id number] Patient Account Number: [id number] Date of Birth/Sex: 1966/05/02 (54 y.o. M) Treating RN: Todd [name] Primary Care Lilyrose Tanney: Magdalene Patricia Other Clinician: Referring Cameron Schwinn: Magdalene Patricia Treating Medora Roorda/Extender: Altamese [place] in Treatment: 1 Active Inactive Orientation to the Wound Care Program Nursing Diagnoses: Knowledge deficit related to the wound healing center program Goals: Patient/caregiver will verbalize understanding of the Wound Healing Center Program Date Initiated: 11/22/2019 Target Resolution Date: 12/06/2019 Goal  Status: Active Interventions: Provide education on orientation to the wound center Notes: Wound/Skin Impairment Nursing Diagnoses: Impaired tissue integrity Goals: Ulcer/skin breakdown will have a volume reduction of 30% by week 4 Date Initiated: 11/22/2019 Target Resolution Date: 12/20/2019 Goal Status: Active Interventions: Assess ulceration(s) every visit Treatment Activities: Topical wound management initiated : 11/22/2019 Notes: Electronic Signature(s) Signed: 11/29/2019 5:27:48 PM By: Elliot Gurney, BSN, RN, CWS, Kim RN, BSN Entered By: Elliot Gurney, BSN, RN, CWS, Todd [name] on 11/29/2019 10:43:44 Todd [name] ([id number]) -------------------------------------------------------------------------------- Pain Assessment Details Patient Name: Todd [name] Date of Service: 11/29/2019 10:15 AM Medical Record Number: [id number] Patient Account Number: [id number] Date of Birth/Sex: [birth year] (54 y.o. M) Treating RN: Todd [name] Primary Care Towanda Hornstein: Magdalene Patricia Other Clinician: Referring Carston Riedl: Magdalene Patricia Treating Freedom Lopezperez/Extender: Altamese [place] in Treatment: 1 Active Problems Location of Pain Severity and Description of Pain Patient Has Paino No Site Locations Pain Management and Medication Current Pain Management: Notes Patient denies pain at this time. Electronic Signature(s) Signed: 11/29/2019 5:27:48 PM By: Elliot Gurney, BSN, RN, CWS, Kim RN, BSN Entered By: Elliot Gurney, BSN, RN, CWS, Todd [name] on 11/29/2019 10:07:27 Todd [name] ([id number]) -------------------------------------------------------------------------------- Patient/Caregiver Education Details Patient Name: Todd [name] Date of Service: 11/29/2019 10:15 AM Medical Record Number: [id number] Patient Account Number: [id number] Date of Birth/Gender: [birth year] (54 y.o. M) Treating RN: Todd [name] Primary Care Physician: Magdalene Patricia Other Clinician: Referring Physician: Magdalene Patricia Treating Physician/Extender: Altamese [place] in Treatment: 1 Education Assessment Education Provided To: Patient Education Topics Provided Pressure: Handouts: Preventing Pressure Ulcers Methods: Demonstration, Explain/Verbal Responses: State content correctly Wound/Skin Impairment: Handouts: Caring for Your Ulcer Methods: Demonstration, Explain/Verbal Responses: State content correctly Electronic Signature(s) Signed: 11/29/2019 5:27:48 PM By: Elliot Gurney, BSN, RN, CWS, Kim RN, BSN Entered By: Elliot Gurney, BSN, RN, CWS, Todd [name] on 11/29/2019 10:50:46 Todd [name] ([id number]) -------------------------------------------------------------------------------- Wound Assessment Details Patient Name: Todd [name] Date of Service: 11/29/2019 10:15 AM Medical Record Number: [id number] Patient Account Number: [id number] Date of Birth/Sex: [birth year] (54 y.o. M) Treating RN: Todd [name] Primary Care Madelyne Millikan: Magdalene Patricia Other Clinician: Referring Raylin Winer: Magdalene Patricia Treating Tashonna Descoteaux/Extender: Altamese [place] in Treatment: 1 Wound Status Wound Number: 1 Primary Etiology: Diabetic Wound/Ulcer of the Lower Extremity Wound Location: Left Amputation Site - Below Knee - Proximal Wound Status: Open Wounding Event: Gradually Appeared Comorbid Sleep Apnea, Hypertension, Type II History: Diabetes Date Acquired: 08/13/2019 Weeks Of Treatment: 1 Clustered Wound: No Photos Wound Measurements Length: (cm) 0.1 Width: (cm) 0.1  Depth: (cm) 0.1 Area: (cm) 0.008 Volume: (cm) 0.001 % Reduction in Area: 0% % Reduction in Volume: 0% Epithelialization: Medium (34-66%) Tunneling: No Undermining: No Wound Description Classification: Grade 1 Wound Margin: Flat and Intact Exudate Amount: Medium Exudate Type: Sanguinous Exudate Color: red Foul Odor After Cleansing: No Slough/Fibrino No Wound Bed Granulation Amount: Large (67-100%) Exposed Structure Granulation Quality: Pink Fascia Exposed: No Necrotic Amount: None Present  (0%) Fat Layer (Subcutaneous Tissue) Exposed: Yes Tendon Exposed: No Muscle Exposed: No Joint Exposed: No Bone Exposed: No Treatment Notes Todd [name], Todd [name] ([id number]) Wound #1 (Left, Proximal Amputation Site - Below Knee) Electronic Signature(s) Signed: 11/29/2019 5:27:48 PM By: Elliot Gurney, BSN, RN, CWS, Kim RN, BSN Entered By: Elliot Gurney, BSN, RN, CWS, Todd [name] on 11/29/2019 10:13:11 Todd [name] ([id number]) -------------------------------------------------------------------------------- Wound Assessment Details Patient Name: Todd [name] Date of Service: 11/29/2019 10:15 AM Medical Record Number: [id number] Patient Account Number: [id number] Date of Birth/Sex: 02-03-1966 (54 y.o. M) Treating RN: Todd [name] Primary Care Emmaus Brandi: Magdalene Patricia Other Clinician: Referring Derreck Wiltsey: Magdalene Patricia Treating Martrice Apt/Extender: Altamese [place] in Treatment: 1 Wound Status Wound Number: 2 Primary Etiology: Diabetic Wound/Ulcer of the Lower Extremity Wound Location: Left Amputation Site - Below Knee - Distal Wound Status: Open Wounding Event: Gradually Appeared Comorbid Sleep Apnea, Hypertension, Type II History: Diabetes Date Acquired: 08/13/2019 Weeks Of Treatment: 1 Clustered Wound: No Photos Wound Measurements Length: (cm) 0.1 Width: (cm) 0.1 Depth: (cm) 0.1 Area: (cm) 0.008 Volume: (cm) 0.001 % Reduction in Area: 0% % Reduction in Volume: 0% Epithelialization: Medium (34-66%) Tunneling: No Undermining: No Wound Description Classification: Grade 1 Wound Margin: Flat and Intact Exudate Amount: Medium Exudate Type: Sanguinous Exudate Color: red Foul Odor After Cleansing: No Slough/Fibrino No Wound Bed Granulation Amount: Large (67-100%) Exposed Structure Granulation Quality: Pink Fascia Exposed: No Necrotic Amount: None Present (0%) Fat Layer (Subcutaneous Tissue) Exposed: Yes Tendon Exposed: No Muscle Exposed: No Joint Exposed: No Bone Exposed: No Treatment  Notes Todd [name], Todd [name] ([id number]) Wound #2 (Left, Distal Amputation Site - Below Knee) Electronic Signature(s) Signed: 11/29/2019 5:27:48 PM By: Elliot Gurney, BSN, RN, CWS, Kim RN, BSN Entered By: Elliot Gurney, BSN, RN, CWS, Todd [name] on 11/29/2019 10:13:33 Todd [name] ([id number]) -------------------------------------------------------------------------------- Vitals Details Patient Name: Todd [name] Date of Service: 11/29/2019 10:15 AM Medical Record Number: [id number] Patient Account Number: [id number] Date of Birth/Sex: Sep 04, 1966 (54 y.o. M) Treating RN: Todd [name] Primary Care Jisela Merlino: Magdalene Patricia Other Clinician: Referring Bates Col[place]: Magdalene Patricia Treating Jameria Bradway/Extender: Altamese  in Treatment: 1 Vital Signs Time Taken: 10:07 Pulse (bpm): 70 Height (in): 68 Respiratory Rate (breaths/min): 16 Weight (lbs): 267 Blood Pressure (mmHg): 169/86 Body Mass Index (BMI): 40.6 Reference Range: 80 - 120 mg / dl Electronic Signature(s) Signed: 11/29/2019 5:27:48 PM By: Elliot Gurney, BSN, RN, CWS, Kim RN, BSN Entered By: Elliot Gurney, BSN, RN, CWS, Todd [name] on 11/29/2019 10:08:09

## 2019-11-29 NOTE — Progress Notes (Signed)
Todd [name], Todd [name] ([id number]) Visit Report for 11/29/2019 HPI Details Patient Name: Todd [name], Todd [name] Date of Service: 11/29/2019 10:15 AM Medical Record Number: [id number] Patient Account Number: [id number] Date of Birth/Sex: [birth year] (54 y.o. 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 He is also a type II diabetic. He has developed painful calluses on the outer part of his left stump. He says these started in November. They will episodically drain or bleed but he always finds them painful. He has [facility] with [facility] to [facility] his [facility]. On Friday in 2 days time. He describes activity limiting pain with very little exertion i.e. when he walks from his car to the entrance way of a store like [employer]. Past medical history includes type 2 diabetes, hypertension, right foot capsulitis, cervical spondylosis, lumbar spinal fusion, hyperlipidemia, interstitial cystitis, BPH, obstructive sleep apnea, gastrointestinal reflux disease, PTSD. Looking through his hospital records he has had DVT rule outs negative x4 since 2017 he has not had any arterial studies 2/17; patient I admitted to the clinic last week with 2 painful areas on his left lateral stump and what is a very proximal BKA site. Since he has been here he has been to [facility] they apparently made some modifications to the prosthesis. This is helping somewhat although he still finds the pain difficult somewhat unpredictable. We have arranged arterial studies for him because of the possibility of claudication, the absence of the popliteal artery on the left and coolness to his  stump. Electronic Signature(s) Signed: 11/29/2019 4:30:10 PM By: Baltazar Najjar MD Entered By: Baltazar Najjar on 11/29/2019 12:17:02 Todd [name] ([id number]) -------------------------------------------------------------------------------- Physical Exam Details Patient Name: Todd [name] Date of Service: 11/29/2019 10:15 AM Medical Record Number: [id number] Patient Account Number: [id number] Date of Birth/Sex: 1966/08/30 (54 y.o. M) Treating RN: Huel Coventry Primary Care Provider: Magdalene Patricia Other Clinician: Referring Provider: Magdalene Patricia Treating Provider/Extender: Altamese [place] in Treatment: 1 Constitutional Patient is hypertensive.. Pulse regular and within target range for patient.Marland Kitchen Respirations regular, non-labored and within target range.. Temperature is normal and within the target range for the patient.Marland Kitchen appears in no distress. Cardiovascular I cannot feel a popliteal pulse on the left although his femoral pulses palpable.. Musculoskeletal Stump is cool. Notes Wound exam; the patient has 2 callused areas on the lateral stump. These are dry and look like chronic friction areas probably in his prosthesis. There are visible cracks although by themselves I do not think these should be that painful or at least not as much pain as this patient describes. There is no evidence of surrounding infection. Electronic Signature(s) Signed: 11/29/2019 4:30:10 PM By: Baltazar Najjar MD Entered By: Baltazar Najjar on 11/29/2019 12:20:51 Todd [name] ([id number]) -------------------------------------------------------------------------------- Physician Orders Details Patient Name: Todd [name] Date of Service: 11/29/2019 10:15 AM Medical Record Number: [id number] Patient Account Number: [id number] Date of Birth/Sex: 01-06-1966 (54 y.o. M) Treating RN: Huel Coventry Primary Care Provider: Magdalene Patricia Other Clinician: Referring Provider: Magdalene Patricia Treating  Provider/Extender: Altamese [place] in Treatment: 1 Verbal / Phone Orders: No Diagnosis Coding Wound Cleansing Wound #1 Left,Proximal Amputation Site - Below Knee o Cleanse wound with mild soap and water Wound #2 Left,Distal Amputation  Site - Below Knee o Cleanse wound with mild soap and water Skin Barriers/Peri-Wound Care Wound #1 Left,Proximal Amputation Site - Below Knee o Moisturizing lotion Wound #2 Left,Distal Amputation Site - Below Knee o Moisturizing lotion Follow-up Appointments Wound #1 Left,Proximal Amputation Site - Below Knee o Return Appointment in 2 weeks. Wound #2 Left,Distal Amputation Site - Below Knee o Return Appointment in 2 weeks. Electronic Signature(s) Signed: 11/29/2019 4:30:10 PM By: Baltazar Najjar MD Signed: 11/29/2019 5:27:48 PM By: Elliot Gurney BSN, RN, CWS, Kim RN, BSN Entered By: Elliot Gurney, BSN, RN, CWS, Kim on 11/29/2019 10:49:46 Todd [name], Todd [name] ([id number]) -------------------------------------------------------------------------------- Problem List Details Patient Name: Todd [name] Date of Service: 11/29/2019 10:15 AM Medical Record Number: [id number] Patient Account Number: [id number] Date of Birth/Sex: 1965/12/30 (54 y.o. M) Treating RN: Huel Coventry Primary Care Provider: Magdalene Patricia Other Clinician: Referring Provider: Magdalene Patricia Treating Provider/Extender: Altamese [place] in Treatment: 1 Active Problems ICD-10 Evaluated Encounter Code Description Active Date Today Diagnosis E11.622 Type 2 diabetes mellitus with other skin ulcer 11/22/2019 No Yes E11.51 Type 2 diabetes mellitus with diabetic peripheral angiopathy 11/22/2019 No Yes without gangrene L97.828 Non-pressure chronic ulcer of other part of left lower leg with 11/22/2019 No Yes other specified severity Inactive Problems Resolved Problems Electronic Signature(s) Signed: 11/29/2019 4:30:10 PM By: Baltazar Najjar MD Entered By: Baltazar Najjar on 11/29/2019  12:15:32 Todd [name] ([id number]) -------------------------------------------------------------------------------- Progress Note Details Patient Name: Todd [name] Date of Service: 11/29/2019 10:15 AM Medical Record Number: [id number] Patient Account Number: [id number] Date of Birth/Sex: Dec 28, 1965 (54 y.o. 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 He is also a type II diabetic. He has developed painful calluses on the outer part of his left stump. He says these started in November. They will episodically drain or bleed but he always finds them painful. He has [facility] with [facility] to [facility] his [facility]. On Friday in 2 days time. He describes activity limiting pain with very little exertion i.e. when he walks from his car to the entrance way of a store like [employer]. Past medical history includes type 2 diabetes, hypertension, right foot capsulitis, cervical spondylosis, lumbar spinal fusion, hyperlipidemia, interstitial cystitis, BPH, obstructive sleep apnea, gastrointestinal reflux disease, PTSD. Looking through his hospital records he has had DVT rule outs negative x4 since 2017 he has not had any arterial studies 2/17; patient I admitted to the clinic last week with 2 painful areas on his left lateral stump and what is a very proximal BKA site. Since he has been here he has [facility] they apparently made some modifications to the prosthesis. This is helping somewhat although he still finds the pain difficult somewhat unpredictable. We have arranged arterial studies for him because of the possibility of claudication,  the absence of the popliteal artery on the left and coolness to his stump. Objective Constitutional Patient is hypertensive.. Pulse regular and within target range for patient.Marland Kitchen Respirations regular, non-labored and within target range.. Temperature is normal and within the target range for the patient.Marland Kitchen appears in no distress. Vitals Time Taken: 10:07 AM, Height: 68 in, Weight: 267 lbs, BMI: 40.6, Pulse: 70 bpm, Respiratory Rate: 16 breaths/min, Blood Pressure: 169/86 mmHg.  Cardiovascular I cannot feel a popliteal pulse on the left although his femoral pulses palpable.. Musculoskeletal Stump is cool. General Notes: Wound exam; the patient has 2 callused areas on the lateral stump. These are dry and look like chronic friction areas probably in his prosthesis. There are visible cracks although by themselves I do not think these should be that painful or at least not as much pain as this patient describes. There is no evidence of surrounding infection. Todd [name], Todd [name] ([id number]) Integumentary (Hair, Skin) Wound #1 status is Open. Original cause of wound was Gradually Appeared. The wound is located on the Left,Proximal Amputation Site - Below Knee. The wound measures 0.1cm length x 0.1cm width x 0.1cm depth; 0.008cm^2 area and 0.001cm^3 volume. There is Fat Layer (Subcutaneous Tissue) Exposed exposed. There is no tunneling or undermining noted. There is a medium amount of sanguinous drainage noted. The wound margin is flat and intact. There is large (67- 100%) pink granulation within the wound bed. There is no necrotic tissue within the wound bed. Wound #2 status is Open. Original cause of wound was Gradually Appeared. The wound is located on the Left,Distal Amputation Site - Below Knee. The wound measures 0.1cm length x 0.1cm width x 0.1cm depth; 0.008cm^2 area and 0.001cm^3 volume. There is Fat Layer (Subcutaneous Tissue) Exposed exposed. There is no tunneling or undermining noted. There is a  medium amount of sanguinous drainage noted. The wound margin is flat and intact. There is large (67- 100%) pink granulation within the wound bed. There is no necrotic tissue within the wound bed. Assessment Active Problems ICD-10 Type 2 diabetes mellitus with other skin ulcer Type 2 diabetes mellitus with diabetic peripheral angiopathy without gangrene Non-pressure chronic ulcer of other part of left lower leg with other specified severity Plan Wound Cleansing: Wound #1 Left,Proximal Amputation Site - Below Knee: Cleanse wound with mild soap and water Wound #2 Left,Distal Amputation Site - Below Knee: Cleanse wound with mild soap and water Skin Barriers/Peri-Wound Care: Wound #1 Left,Proximal Amputation Site - Below Knee: Moisturizing lotion Wound #2 Left,Distal Amputation Site - Below Knee: Moisturizing lotion Follow-up Appointments: Wound #1 Left,Proximal Amputation Site - Below Knee: Return Appointment in 2 weeks. Wound #2 Left,Distal Amputation Site - Below Knee: Return Appointment in 2 weeks. 1. I recommended moisturizing these areas under a thick Band-Aid. He states that the Band-Aids actually come off. There have been modifications to his prosthesis he feels somewhat better 2. I do not want to debride these areas but I could consider a exfoliating agent. 3. I would like to get arterial studies done here before I give this to much more thought. I told the patient I am concerned that if these areas breakdown into wounds he will not be able to use his prosthesis Todd [name], Todd [name] ([id number]) Electronic Signature(s) Signed: 11/29/2019 4:30:10 PM By: Baltazar Najjar MD Entered By: Baltazar Najjar on 11/29/2019 12:22:33 Todd [name] ([id number]) -------------------------------------------------------------------------------- SuperBill Details Patient Name: Todd [name] Date of Service: 11/29/2019 Medical Record Number: [id number] Patient Account Number: [id number] Date of Birth/Sex:  Mar 01, 1966 (54 y.o. M) Treating RN: Huel Coventry Primary Care Provider: Magdalene Patricia Other Clinician: Referring Provider: Magdalene Patricia Treating Provider/Extender: Altamese [place] in Treatment: 1 Diagnosis Coding ICD-10 Codes Code Description E11.622 Type 2 diabetes mellitus with other skin ulcer E11.51 Type 2 diabetes mellitus with diabetic peripheral angiopathy without gangrene L97.828 Non-pressure chronic ulcer of other part of left lower leg with other specified severity Facility Procedures CPT4 Code: [id number] Description: [phone] - WOUND CARE  VISIT-LEV 2 EST PT Modifier: Quantity: 1 Physician Procedures CPT4 Code Description: [id number] - WC PHYS LEVEL 3 - EST PT ICD-10 Diagnosis Description E11.622 Type 2 diabetes mellitus with other skin ulcer E11.51 Type 2 diabetes mellitus with diabetic peripheral angiopathy wi L97.828 Non-pressure chronic ulcer  of other part of left lower leg with Modifier: thout gangrene other specified s Quantity: 1 everity Electronic Signature(s) Signed: 11/29/2019 4:30:10 PM By: Linton Ham MD Entered By: Linton Ham on 11/29/2019 12:22:51

## 2019-12-05 ENCOUNTER — Other Ambulatory Visit (INDEPENDENT_AMBULATORY_CARE_PROVIDER_SITE_OTHER): Payer: Self-pay | Admitting: Internal Medicine

## 2019-12-05 ENCOUNTER — Ambulatory Visit (INDEPENDENT_AMBULATORY_CARE_PROVIDER_SITE_OTHER): Payer: Medicare Other | Admitting: Urology

## 2019-12-05 ENCOUNTER — Encounter: Payer: Self-pay | Admitting: Urology

## 2019-12-05 ENCOUNTER — Other Ambulatory Visit: Payer: Self-pay

## 2019-12-05 VITALS — BP 130/81 | HR 81 | Ht 68.0 in | Wt 265.0 lb

## 2019-12-05 DIAGNOSIS — R300 Dysuria: Secondary | ICD-10-CM | POA: Diagnosis not present

## 2019-12-05 DIAGNOSIS — N401 Enlarged prostate with lower urinary tract symptoms: Secondary | ICD-10-CM

## 2019-12-05 DIAGNOSIS — G8929 Other chronic pain: Secondary | ICD-10-CM | POA: Diagnosis not present

## 2019-12-05 DIAGNOSIS — M25511 Pain in right shoulder: Secondary | ICD-10-CM | POA: Diagnosis not present

## 2019-12-05 DIAGNOSIS — N3010 Interstitial cystitis (chronic) without hematuria: Secondary | ICD-10-CM

## 2019-12-05 DIAGNOSIS — M25512 Pain in left shoulder: Secondary | ICD-10-CM

## 2019-12-05 DIAGNOSIS — R0989 Other specified symptoms and signs involving the circulatory and respiratory systems: Secondary | ICD-10-CM

## 2019-12-05 DIAGNOSIS — N138 Other obstructive and reflux uropathy: Secondary | ICD-10-CM

## 2019-12-05 LAB — BLADDER SCAN AMB NON-IMAGING: Scan Result: 9

## 2019-12-05 MED ORDER — OXYBUTYNIN CHLORIDE ER 15 MG PO TB24: 15.0000 mg | ORAL_TABLET | Freq: Every day | ORAL | 3 refills | Status: DC
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
## 2019-12-06 ENCOUNTER — Ambulatory Visit (INDEPENDENT_AMBULATORY_CARE_PROVIDER_SITE_OTHER): Payer: Medicare Other

## 2019-12-06 ENCOUNTER — Ambulatory Visit: Payer: Medicare Other | Admitting: Internal Medicine

## 2019-12-06 DIAGNOSIS — R0989 Other specified symptoms and signs involving the circulatory and respiratory systems: Secondary | ICD-10-CM

## 2019-12-06 LAB — URINALYSIS, COMPLETE
Bilirubin, UA: NEGATIVE
Ketones, UA: NEGATIVE
Leukocytes,UA: NEGATIVE
Nitrite, UA: NEGATIVE
Protein,UA: NEGATIVE
RBC, UA: NEGATIVE
Specific Gravity, UA: 1.015 (ref 1.005–1.030)
Urobilinogen, Ur: 0.2 mg/dL (ref 0.2–1.0)
pH, UA: 5 (ref 5.0–7.5)

## 2019-12-06 LAB — MICROSCOPIC EXAMINATION
Bacteria, UA: NONE SEEN
RBC, Urine: NONE SEEN /hpf (ref 0–2)

## 2019-12-13 ENCOUNTER — Ambulatory Visit: Payer: Medicare Other | Admitting: Internal Medicine

## 2019-12-14 DIAGNOSIS — E669 Obesity, unspecified: Secondary | ICD-10-CM | POA: Insufficient documentation

## 2019-12-20 ENCOUNTER — Encounter: Payer: Medicare Other | Attending: Internal Medicine | Admitting: Internal Medicine

## 2019-12-20 ENCOUNTER — Other Ambulatory Visit: Payer: Self-pay

## 2019-12-20 DIAGNOSIS — Z981 Arthrodesis status: Secondary | ICD-10-CM | POA: Diagnosis not present

## 2019-12-20 DIAGNOSIS — F4310 Post-traumatic stress disorder, unspecified: Secondary | ICD-10-CM | POA: Insufficient documentation

## 2019-12-20 DIAGNOSIS — G4733 Obstructive sleep apnea (adult) (pediatric): Secondary | ICD-10-CM | POA: Insufficient documentation

## 2019-12-20 DIAGNOSIS — Z89512 Acquired absence of left leg below knee: Secondary | ICD-10-CM | POA: Diagnosis not present

## 2019-12-20 DIAGNOSIS — I10 Essential (primary) hypertension: Secondary | ICD-10-CM | POA: Diagnosis not present

## 2019-12-20 DIAGNOSIS — N400 Enlarged prostate without lower urinary tract symptoms: Secondary | ICD-10-CM | POA: Diagnosis not present

## 2019-12-20 DIAGNOSIS — E1151 Type 2 diabetes mellitus with diabetic peripheral angiopathy without gangrene: Secondary | ICD-10-CM | POA: Insufficient documentation

## 2019-12-20 DIAGNOSIS — K219 Gastro-esophageal reflux disease without esophagitis: Secondary | ICD-10-CM | POA: Diagnosis not present

## 2019-12-20 DIAGNOSIS — E785 Hyperlipidemia, unspecified: Secondary | ICD-10-CM | POA: Diagnosis not present

## 2019-12-20 DIAGNOSIS — M47812 Spondylosis without myelopathy or radiculopathy, cervical region: Secondary | ICD-10-CM | POA: Insufficient documentation

## 2019-12-20 DIAGNOSIS — L97829 Non-pressure chronic ulcer of other part of left lower leg with unspecified severity: Secondary | ICD-10-CM | POA: Insufficient documentation

## 2019-12-20 DIAGNOSIS — E11622 Type 2 diabetes mellitus with other skin ulcer: Secondary | ICD-10-CM | POA: Diagnosis present

## 2019-12-20 NOTE — Progress Notes (Signed)
Todd, [name] ([id number]) Visit Report for 12/20/2019 HPI Details Patient Name: Todd [name], Todd [name] Date of Service: 12/20/2019 9:15 AM Medical Record Number: [id number] Patient Account Number: [id number] Date of Birth/Sex: [birth year] (54 y.o. 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 He is also a type II diabetic. He has developed painful calluses on the outer part of his left stump. He says these started in November. They will episodically drain or bleed but he always finds them painful. He [facility] his [facility]. On Friday in 2 days time. He describes activity limiting pain with very little exertion i.e. when he walks from his car to the entrance way of a store like [employer]. Past medical history includes type 2 diabetes, hypertension, right foot capsulitis, cervical spondylosis, lumbar spinal fusion, hyperlipidemia, interstitial cystitis, BPH, obstructive sleep apnea, gastrointestinal reflux disease, PTSD. Looking through his hospital records he has had DVT rule outs negative x4 since 2017 he has not had any arterial studies 2/17; patient I admitted to the clinic last week with 2 painful areas on his left lateral stump and what is a very proximal BKA site. Since he has been here he has [facility] they apparently made some modifications to the prosthesis. This is helping somewhat although he still finds the pain difficult somewhat unpredictable. We have arranged arterial studies for him because of the possibility of claudication, the absence of the popliteal artery on the left and coolness to his  stump. 3/10; we sent the patient for arterial studies bilaterally and they were quite normal. In particular there were triphasic waveforms at the common femoral and popliteal pulse on the left. No ischemia to the stump he has been using Goldbond cream on the irritated areas on the lateral aspect of his stump. He tells [place] that he has a new prosthesis in the works and is had the mold made for this perhaps this will help. Electronic Signature(s) Signed: 12/20/2019 5:25:14 PM By: Baltazar Najjar MD Entered By: Baltazar Najjar on 12/20/2019 09:31:08 Todd [name] ([id number]) -------------------------------------------------------------------------------- Physical Exam Details Patient Name: Todd [name] Date of Service: 12/20/2019 9:15 AM Medical Record Number: [id number] Patient Account Number: [id number] Date of Birth/Sex: 1965/12/02 (54 y.o. M) Treating RN: Huel Coventry Primary Care Provider: Magdalene Patricia Other Clinician: Referring Provider: Magdalene Patricia Treating Provider/Extender: Altamese [place] in Treatment: 4 Constitutional Patient is hypertensive.. Pulse regular and within target range for patient.Marland Kitchen Respirations regular, non-labored and within target range.. Temperature is normal and within the target range for the patient.Marland Kitchen appears in no distress. Notes Wound exam; the patient has 2 slightly callused areas on the lateral part of his BKA stump. He said the areas that he says get most irritated although he says at some points they split open and bleed over the course of years. They are not open now in fact there is nothing open now. Undoubtedly these are caused by friction in the prosthesis. There is no surrounding infection Electronic Signature(s) Signed: 12/20/2019 5:25:14 PM By: Baltazar Najjar MD Entered By: Baltazar Najjar on 12/20/2019 09:32:17 Todd [name] ([id number]) -------------------------------------------------------------------------------- Physician Orders  Details Patient Name: Todd [name] Date of Service: 12/20/2019 9:15 AM Medical  Record Number: [id number] Patient Account Number: [id number] Date of Birth/Sex: [birth year] (54 y.o. M) Treating RN: Huel Coventry Primary Care Provider: Magdalene Patricia Other Clinician: Referring Provider: Magdalene Patricia Treating Provider/Extender: Altamese [place] in Treatment: 4 Verbal / Phone Orders: No Diagnosis Coding Discharge From [facility] Services o Discharge from Wound Care Center Notes New prosthetic in process. Electronic Signature(s) Signed: 12/20/2019 5:22:30 PM By: Elliot Gurney, BSN, RN, CWS, Kim RN, BSN Signed: 12/20/2019 5:25:14 PM By: Baltazar Najjar MD Entered By: Elliot Gurney, BSN, RN, CWS, Kim on 12/20/2019 09:28:16 LEIBY, PIGEON ([id number]) -------------------------------------------------------------------------------- Problem List Details Patient Name: Todd [name] Date of Service: 12/20/2019 9:15 AM Medical Record Number: [id number] Patient Account Number: [id number] Date of Birth/Sex: 05-27-1966 (54 y.o. M) Treating RN: Huel Coventry Primary Care Provider: Magdalene Patricia Other Clinician: Referring Provider: Magdalene Patricia Treating Provider/Extender: Altamese [place] in Treatment: 4 Active Problems ICD-10 Evaluated Encounter Code Description Active Date Today Diagnosis E11.622 Type 2 diabetes mellitus with other skin ulcer 11/22/2019 No Yes E11.51 Type 2 diabetes mellitus with diabetic peripheral angiopathy without 11/22/2019 No Yes gangrene L97.828 Non-pressure chronic ulcer of other part of left lower leg with other 11/22/2019 No Yes specified severity Inactive Problems Resolved Problems Electronic Signature(s) Signed: 12/20/2019 5:25:14 PM By: Baltazar Najjar MD Entered By: Baltazar Najjar on 12/20/2019 09:28:50 Todd [name] ([id number]) -------------------------------------------------------------------------------- Progress Note Details Patient Name: Todd [name] Date of Service:  12/20/2019 9:15 AM Medical Record Number: [id number] Patient Account Number: [id number] Date of Birth/Sex: [birth year] (54 y.o. 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 He is also a type II diabetic. He has developed painful calluses on the outer part of his left stump. He says these started in November. They will episodically drain or bleed but he always finds them painful. He has an [facility] with [facility] to [facility] his [facility]. On Friday in 2 days time. He describes activity limiting pain with very little exertion i.e. when he walks from his car to the entrance way of a store like [employer]. Past medical history includes type 2 diabetes, hypertension, right foot capsulitis, cervical spondylosis, lumbar spinal fusion, hyperlipidemia, interstitial cystitis, BPH, obstructive sleep apnea, gastrointestinal reflux disease, PTSD. Looking through his hospital records he has had DVT rule outs negative x4 since 2017 he has not had any arterial studies 2/17; patient I admitted to the clinic last week with 2 painful areas on his left lateral stump and what is a very proximal BKA site. Since he has been here he has been [facility] they apparently made some modifications to the prosthesis. This is helping somewhat although he still finds the pain difficult somewhat unpredictable. We have arranged arterial studies for him because of the possibility of claudication, the absence of the popliteal artery on the left and coolness to his stump. 3/10; we sent the patient for arterial studies bilaterally and they were quite normal. In particular there were  triphasic waveforms at the common femoral and popliteal pulse on the left. No ischemia to the stump he has been using Goldbond cream on the irritated areas on the lateral aspect of his stump. He tells [place] that he has a new prosthesis in the works and is  had the mold made for this perhaps this will help. Objective Constitutional Patient is hypertensive.. Pulse regular and within target range for patient.Marland Kitchen Respirations regular, non-labored and within target range.. Temperature is normal and within the target range for the patient.Marland Kitchen appears in no distress. Vitals Time Taken: 9:15 AM, Height: 68 in, Weight: 267 lbs, BMI: 40.6, Temperature: 97.7 F, Pulse: 57 bpm, Respiratory Rate: 16 breaths/min, Blood Pressure: 168/65 mmHg. General Notes: Wound exam; the patient has 2 slightly callused areas on the lateral part of his BKA stump. He said the areas that he says get most irritated although he says at some points they split open and bleed over the course of years. They are not open now in fact there is nothing open now. Undoubtedly these are caused by friction in the prosthesis. There is no surrounding infection Integumentary (Hair, Skin) Wound #1 status is Healed - Epithelialized. Original cause of wound was Gradually Appeared. The wound is located on the Left,Proximal Amputation Site - Below Knee. The wound measures 0cm length x 0cm width x 0cm depth; 0cm^2 area and 0cm^3 volume. There is Fat Layer (Subcutaneous Tissue) Exposed exposed. There is a none present amount of drainage noted. The wound margin is flat and intact. There is no granulation within the wound bed. There is no necrotic tissue within the wound bed. Wound #2 status is Healed - Epithelialized. Original cause of wound was Gradually Appeared. The wound is located on the Left,Distal Amputation Site - Below Knee. The wound measures 0cm length x 0cm width x 0cm depth; 0cm^2 area and 0cm^3 volume. There is Fat Layer (Subcutaneous  Tissue) Exposed exposed. There is a none present amount of drainage noted. The wound margin is flat and intact. There is no granulation within the wound bed. There is no necrotic tissue within the wound bed. MACKINLEY, KIEHN ([id number]) Assessment Active Problems ICD-10 Type 2 diabetes mellitus with other skin ulcer Type 2 diabetes mellitus with diabetic peripheral angiopathy without gangrene Non-pressure chronic ulcer of other part of left lower leg with other specified severity Plan Discharge From [facility] Services: Discharge from [place] [facility] Notes: New prosthetic in process. 1. The patient can be discharged from the wound care center, he does not have an open wound 2. I have advised him to keep these areas moisturized 3. Hopefully his new prosthesis will alleviate some of this issue Electronic Signature(s) Signed: 12/20/2019 5:25:14 PM By: Linton Ham MD Entered By: Linton Ham on 12/20/2019 09:32:54 [name] ([id number]) -------------------------------------------------------------------------------- [place] Details Patient Name: [name] Date of Service: 12/20/2019 Medical Record Number: [id number] Patient Account Number: [id number] Date of Birth/Sex: [birth year] (54 y.o. M) Treating RN: Cornell Barman Primary Care Provider: Royetta Crochet Other Clinician: Referring Provider: Royetta Crochet Treating Provider/Extender: Tito Dine in Treatment: 4 Diagnosis Coding ICD-10 Codes Code Description E11.622 Type 2 diabetes mellitus with other skin ulcer E11.51 Type 2 diabetes mellitus with diabetic peripheral angiopathy without gangrene L97.828 Non-pressure chronic ulcer of other part of left lower leg with other specified severity Facility Procedures CPT4 Code: [id number] Description: [id number] - WOUND CARE VISIT-LEV 2 EST PT Modifier: Quantity: 1 Physician Procedures CPT4 Code: [id number] Description: 66440 - WC PHYS LEVEL 2 - EST PT Modifier: Quantity:  1 CPT4 Code: Description: ICD-10 Diagnosis Description E11.622 Type 2 diabetes mellitus with other skin ulcer L97.828 Non-pressure chronic ulcer of other part of left lower leg with other specifi Modifier: ed severity Quantity: Electronic Signature(s) Signed: 12/20/2019 5:25:14 PM By: Linton Ham MD Entered By: Dellia Nims,  Coulton Schlink on 12/20/2019 09:34:23

## 2019-12-20 NOTE — Progress Notes (Addendum)
NIKOLA, MARONE ([id number]) Visit Report for 12/20/2019 Arrival Information Details Patient Name: Todd [name], Todd [name] Date of Service: 12/20/2019 9:15 AM Medical Record Number: [id number] Patient Account Number: [id number] Date of Birth/Sex: 1966/08/20 (54 y.o. M) Treating RN: Rodell Perna Primary Care Daden Mahany: Magdalene Patricia Other Clinician: Referring Allenmichael Mcpartlin: Magdalene Patricia Treating Kolten Ryback/Extender: Altamese [place] in Treatment: 4 Visit Information History Since Last Visit Added or deleted any medications: No Patient Arrived: Ambulatory Any new allergies or adverse reactions: No Arrival Time: 09:15 Had a fall or experienced change in No Accompanied By: self activities of daily living that may affect Transfer Assistance: None risk of falls: Patient Identification Verified: Yes Signs or symptoms of abuse/neglect since last visito No Hospitalized since last visit: No Has Dressing in Place as Prescribed: Yes Pain Present Now: Yes Electronic Signature(s) Signed: 12/20/2019 11:25:05 AM By: Rodell Perna Entered By: Rodell Perna on 12/20/2019 09:15:38 Todd [name] ([id number]) -------------------------------------------------------------------------------- Clinic Level of Care Assessment Details Patient Name: Todd [name] Date of Service: 12/20/2019 9:15 AM Medical Record Number: [id number] Patient Account Number: [id number] Date of Birth/Sex: 02-Jun-1966 (54 y.o. M) Treating RN: Huel Coventry Primary Care Kaimani Clayson: Magdalene Patricia Other Clinician: Referring Bralynn Donado: Magdalene Patricia Treating Monay Houlton/Extender: Altamese [place] in Treatment: 4 Clinic Level of Care Assessment Items TOOL 4 Quantity Score []  - Use when only an EandM is performed on FOLLOW-UP visit 0 ASSESSMENTS - Nursing Assessment / Reassessment X - Reassessment of Co-morbidities (includes updates in patient status) 1 10 X- 1 5 Reassessment of Adherence to Treatment Plan ASSESSMENTS - Wound and Skin Assessment /  Reassessment X - Simple Wound Assessment / Reassessment - one wound 1 5 []  - 0 Complex Wound Assessment / Reassessment - multiple wounds []  - 0 Dermatologic / Skin Assessment (not related to wound area) ASSESSMENTS - Focused Assessment []  - Circumferential Edema Measurements - multi extremities 0 []  - 0 Nutritional Assessment / Counseling / Intervention []  - 0 Lower Extremity Assessment (monofilament, tuning fork, pulses) []  - 0 Peripheral Arterial Disease Assessment (using hand held doppler) ASSESSMENTS - Ostomy and/or Continence Assessment and Care []  - Incontinence Assessment and Management 0 []  - 0 Ostomy Care Assessment and Management (repouching, etc.) PROCESS - Coordination of Care X - Simple Patient / Family Education for ongoing care 1 15 []  - 0 Complex (extensive) Patient / Family Education for ongoing care []  - 0 Staff obtains , Records, Test Results / Process Orders []  - 0 Staff telephones HHA, Nursing Homes / Clarify orders / etc []  - 0 Routine Transfer to another Facility (non-emergent condition) []  - 0 Routine Hospital Admission (non-emergent condition) []  - 0 New Admissions / / Ordering NPWT, Apligraf, etc. []  - 0 Emergency Hospital Admission (emergent condition) X- 1 10 Simple Discharge Coordination []  - 0 Complex (extensive) Discharge Coordination PROCESS - Special Needs []  - Pediatric / Minor Patient Management 0 []  - 0 Isolation Patient Management []  - 0 Hearing / Language / Visual special needs []  - 0 Assessment of Community assistance (transportation, D/C planning, etc.) Kamath, [place] ( ) []  - 0 Additional assistance / Altered mentation []  - 0 Support Surface(s) Assessment (bed, cushion, seat, etc.) INTERVENTIONS - Wound Cleansing / Measurement X - Simple Wound Cleansing - one wound 1 5 []  - 0 Complex Wound Cleansing - multiple wounds X- 1 5 Wound Imaging (photographs - any number of wounds) []  -  0 Wound Tracing (instead of photographs) X- 1 5 Simple Wound Measurement - one wound []  - 0 Complex Wound Measurement -  multiple wounds INTERVENTIONS - Wound Dressings []  - Small Wound Dressing one or multiple wounds 0 []  - 0 Medium Wound Dressing one or multiple wounds []  - 0 Large Wound Dressing one or multiple wounds []  - 0 Application of Medications - topical []  - 0 Application of Medications - injection INTERVENTIONS - Miscellaneous []  - External ear exam 0 []  - 0 Specimen Collection (cultures, biopsies, blood, body fluids, etc.) []  - 0 Specimen(s) / Culture(s) sent or taken to Lab for analysis []  - 0 Patient Transfer (multiple staff / / Similar devices) []  - 0 Simple Staple / Suture removal (25 or less) []  - 0 Complex Staple / Suture removal (26 or more) []  - 0 Hypo / Hyperglycemic Management (close monitor of Blood Glucose) []  - 0 Ankle / Brachial Index (ABI) - do not check if billed separately X- 1 5 Vital Signs Has the patient been seen at the hospital within the last three years: Yes Total Score: 65 Level Of Care: New/Established - Level 2 Electronic Signature(s) Signed: 12/20/2019 5:22:30 PM By: , BSN, RN, CWS, Kim RN, BSN Entered By: , BSN, RN, CWS, Kim on 12/20/2019 09:28:50 ( ) -------------------------------------------------------------------------------- Encounter Discharge Information Details Patient Name: Date of Service: 12/20/2019 9:15 AM Medical Record Number: Nurse, adult Patient Account Number: Date of Birth/Sex: 11-Jul-1966 (54 y.o. M) Treating RN: Primary Care Jaleel Allen: 02/19/2020 Other Clinician: Referring Estevon Fluke: Elliot Gurney Treating Ezrie Bunyan/Extender: Elliot Gurney in Treatment: 4 Encounter Discharge Information Items Discharge Condition: Stable Ambulatory Status: Ambulatory Discharge Destination: Home Transportation: Private Auto Accompanied  By: self Schedule Follow-up Appointment: No Clinical Summary of Care: Electronic Signature(s) Signed: 12/20/2019 5:22:30 PM By: Todd [name], BSN, RN, CWS, Kim RN, BSN Entered By: [id number], BSN, RN, CWS, Kim on 12/20/2019 09:30:37 02/19/2020 ([id number]) -------------------------------------------------------------------------------- Lower Extremity Assessment Details Patient Name: [id number] Date of Service: 12/20/2019 9:15 AM Medical Record Number: [id number] Patient Account Number: Huel Coventry Date of Birth/Sex: 1966/07/21 (54 y.o. M) Treating RN: Altamese Vickery Primary Care Braxson Hollingsworth: 02/19/2020 Other Clinician: Referring Isom Kochan: Elliot Gurney Treating Tiffine Henigan/Extender: Elliot Gurney in Treatment: 4 Electronic Signature(s) Signed: 12/20/2019 11:25:05 AM By: Todd [name] Entered By: [id number] on 12/20/2019 09:21:36 Todd [name], Todd [name] ([id number]) -------------------------------------------------------------------------------- Multi Wound Chart Details Patient Name: [id number] Date of Service: 12/20/2019 9:15 AM Medical Record Number: [id number] Patient Account Number: Rodell Perna Date of Birth/Sex: 11-18-1965 (54 y.o. M) Treating RN: Altamese [place] Primary Care Amariah Kierstead: 02/19/2020 Other Clinician: Referring Zafir Schauer: Rodell Perna Treating Cleora Karnik/Extender: Rodell Perna in Treatment: 4 Vital Signs Height(in): 68 Pulse(bpm): 57 Weight(lbs): 267 Blood Pressure(mmHg): 168/65 Body Mass Index(BMI): 41 Temperature(F): 97.7 Respiratory Rate(breaths/min): 16 Photos: [N/A:N/A] Wound Location: Left, Proximal Amputation Site - Left, Distal Amputation Site - Below N/A Below Knee Knee Wounding Event: Gradually Appeared Gradually Appeared N/A Primary Etiology: Diabetic Wound/Ulcer of the Lower Diabetic Wound/Ulcer of the Lower N/A Extremity Extremity Comorbid History: Sleep Apnea, Hypertension, Type II Sleep Apnea, Hypertension, Type II N/A Diabetes Diabetes Date  Acquired: 08/13/2019 08/13/2019 N/A Weeks of Treatment: 4 4 N/A Wound Status: Healed - Epithelialized Healed - Epithelialized N/A Measurements L x W x D (cm) 0x0x0 0x0x0 N/A Area (cm) : 0 0 N/A Volume (cm) : 0 0 N/A % Reduction in Area: 100.00% 100.00% N/A % Reduction in Volume: 100.00% 100.00% N/A Classification: Grade 1 Grade 1 N/A Exudate Amount: None Present None Present N/A Wound Margin: Flat and Intact Flat and Intact N/A Granulation Amount: None Present (0%) None  Present (0%) N/A Necrotic Amount: None Present (0%) None Present (0%) N/A Exposed Structures: Fat Layer (Subcutaneous Tissue) Fat Layer (Subcutaneous Tissue) N/A Exposed: Yes Exposed: Yes Fascia: No Fascia: No Tendon: No Tendon: No Muscle: No Muscle: No Joint: No Joint: No Bone: No Bone: No Epithelialization: Medium (34-66%) Medium (34-66%) N/A Treatment Notes Electronic Signature(s) Signed: 12/20/2019 5:25:14 PM By: Baltazar Najjar MD Entered By: Baltazar Najjar on 12/20/2019 09:29:06 Todd [name] ([id number]) -------------------------------------------------------------------------------- Multi-Disciplinary Care Plan Details Patient Name: Todd [name] Date of Service: 12/20/2019 9:15 AM Medical Record Number: [id number] Patient Account Number: [id number] Date of Birth/Sex: 1966-02-03 (54 y.o. M) Treating RN: Huel Coventry Primary Care Mickaela Starlin: Magdalene Patricia Other Clinician: Referring Erie Radu: Magdalene Patricia Treating Kennidi Yoshida/Extender: Altamese [place] in Treatment: 4 Active Inactive Electronic Signature(s) Signed: 12/22/2019 4:16:47 PM By: Elliot Gurney, BSN, RN, CWS, Kim RN, BSN Previous Signature: 12/20/2019 5:22:30 PM Version By: Elliot Gurney, BSN, RN, CWS, Kim RN, BSN Entered By: Elliot Gurney, BSN, RN, CWS, Kim on 12/22/2019 16:16:47 Todd [name] ([id number]) -------------------------------------------------------------------------------- Pain Assessment Details Patient Name: Todd [name] Date of Service:  12/20/2019 9:15 AM Medical Record Number: [id number] Patient Account Number: [id number] Date of Birth/Sex: Jan 20, 1966 (54 y.o. M) Treating RN: Rodell Perna Primary Care Verlon Pischke: Magdalene Patricia Other Clinician: Referring Adrieanna Boteler: Magdalene Patricia Treating Charron Coultas/Extender: Altamese [place] in Treatment: 4 Active Problems Location of Pain Severity and Description of Pain Patient Has Paino Yes Site Locations Pain Location: Pain in Ulcers Rate the pain. Current Pain Level: 5 Pain Management and Medication Current Pain Management: Electronic Signature(s) Signed: 12/20/2019 11:25:05 AM By: Rodell Perna Entered By: Rodell Perna on 12/20/2019 09:16:10 Todd [name] ([id number]) -------------------------------------------------------------------------------- Patient/Caregiver Education Details Patient Name: Todd [name] Date of Service: 12/20/2019 9:15 AM Medical Record Number: [id number] Patient Account Number: [id number] Date of Birth/Gender: September 29, 1966 (55 y.o. M) Treating RN: Huel Coventry Primary Care Physician: Magdalene Patricia Other Clinician: Referring Physician: Magdalene Patricia Treating Physician/Extender: Altamese [place] in Treatment: 4 Education Assessment Education Provided To: Patient Education Topics Provided Wound/Skin Impairment: Handouts: Caring for Your Ulcer Methods: Demonstration, Explain/Verbal Responses: State content correctly Electronic Signature(s) Signed: 12/20/2019 5:22:30 PM By: Elliot Gurney, BSN, RN, CWS, Kim RN, BSN Entered By: Elliot Gurney, BSN, RN, CWS, Kim on 12/20/2019 09:30:19 Todd [name] ([id number]) -------------------------------------------------------------------------------- Wound Assessment Details Patient Name: Todd [name] Date of Service: 12/20/2019 9:15 AM Medical Record Number: [id number] Patient Account Number: [id number] Date of Birth/Sex: March 28, 1966 (54 y.o. 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, Proximal Amputation Site - Below Knee Wound Status: Healed - Epithelialized Wounding Event: Gradually Appeared Comorbid History: Sleep Apnea, Hypertension, Type II Diabetes Date Acquired: 08/13/2019 Weeks Of Treatment: 4 Clustered Wound: No Photos Wound Measurements Length: (cm) Width: (cm) Depth: (cm) Area: (cm) Volume: (cm) 0 % Reduction in Area: 100% 0 % Reduction in Volume: 100% 0 Epithelialization: Medium (34-66%) 0 0 Wound Description Classification: Grade 1 Wound Margin: Flat and Intact Exudate Amount: None Present Foul Odor After Cleansing: No Slough/Fibrino No Wound Bed Granulation Amount: None Present (0%) Exposed Structure Necrotic Amount: None Present (0%) Fascia Exposed: No Fat Layer (Subcutaneous Tissue) Exposed: Yes Tendon Exposed: No Muscle Exposed: No Joint Exposed: No Bone Exposed: No Electronic Signature(s) Signed: 12/20/2019 5:22:30 PM By: Elliot Gurney, BSN, RN, CWS, Kim RN, BSN Entered By: Elliot Gurney, BSN, RN, CWS, Kim on 12/20/2019 09:27:30 Todd [name] ([id number]) --------------------------------------------------------------------------------  Wound Assessment Details Patient Name: Todd [name], Todd [name] Date of Service: 12/20/2019 9:15 AM Medical Record Number: [id number] Patient Account Number: [id number] Date of Birth/Sex: 1966/06/11 (54 y.o. 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, Distal Amputation Site - Below Knee Wound Status: Healed - Epithelialized Wounding Event:  Gradually Appeared Comorbid History: Sleep Apnea, Hypertension, Type II Diabetes Date Acquired: 08/13/2019 Weeks Of Treatment: 4 Clustered Wound: No Photos Wound Measurements Length: (cm) Width: (cm) Depth: (cm) Area: (cm) Volume: (cm) 0 % Reduction in Area: 100% 0 % Reduction in Volume: 100% 0 Epithelialization: Medium (34-66%) 0 0 Wound Description Classification: Grade 1 Wound Margin: Flat and Intact Exudate Amount: None Present Foul Odor After Cleansing: No Slough/Fibrino No Wound Bed Granulation Amount: None Present (0%) Exposed Structure Necrotic Amount: None Present (0%) Fascia Exposed: No Fat Layer (Subcutaneous Tissue) Exposed: Yes Tendon Exposed: No Muscle Exposed: No Joint Exposed: No Bone Exposed: No Electronic Signature(s) Signed: 12/20/2019 5:22:30 PM By: Gretta Cool, BSN, RN, CWS, Kim RN, BSN Entered By: Gretta Cool, BSN, RN, CWS, Kim on 12/20/2019 09:27:37 Todd [name] ([id number]) -------------------------------------------------------------------------------- [place] Details Patient Name: Todd [name] Date of Service: 12/20/2019 9:15 AM Medical Record Number: [id number] Patient Account Number: [id number] Date of Birth/Sex: [birth year] (54 y.o. M) Treating RN: Army Melia Primary Care Venia Riveron: Royetta Crochet Other Clinician: Referring Joelee Snoke: Royetta Crochet Treating Shellyann Wandrey/Extender: Tito Dine in Treatment: 4 Vital Signs Time Taken: 09:15 Temperature (F): 97.7 Height (in): 68 Pulse (bpm): 57 Weight (lbs): 267 Respiratory Rate (breaths/min): 16 Body Mass Index (BMI): 40.6 Blood Pressure (mmHg): 168/65 Reference Range: 80 - 120 mg / dl Electronic Signature(s) Signed: 12/20/2019 11:25:05 AM By: Army Melia Entered By: Army Melia on 12/20/2019 09:16:42

## 2019-12-21 ENCOUNTER — Other Ambulatory Visit: Payer: Self-pay | Admitting: Urology

## 2019-12-31 ENCOUNTER — Other Ambulatory Visit: Payer: Self-pay

## 2019-12-31 ENCOUNTER — Encounter: Payer: Self-pay | Admitting: Emergency Medicine

## 2019-12-31 ENCOUNTER — Emergency Department
Admission: EM | Admit: 2019-12-31 | Discharge: 2019-12-31 | Disposition: A | Discharge status: Home / Self Care | Payer: Medicare Other | Attending: Emergency Medicine | Admitting: Emergency Medicine

## 2019-12-31 DIAGNOSIS — I10 Essential (primary) hypertension: Secondary | ICD-10-CM | POA: Diagnosis not present

## 2019-12-31 DIAGNOSIS — Z7984 Long term (current) use of oral hypoglycemic drugs: Secondary | ICD-10-CM | POA: Insufficient documentation

## 2019-12-31 DIAGNOSIS — R0789 Other chest pain: Secondary | ICD-10-CM | POA: Insufficient documentation

## 2019-12-31 DIAGNOSIS — Z79899 Other long term (current) drug therapy: Secondary | ICD-10-CM | POA: Insufficient documentation

## 2019-12-31 DIAGNOSIS — F411 Generalized anxiety disorder: Secondary | ICD-10-CM

## 2019-12-31 DIAGNOSIS — F419 Anxiety disorder, unspecified: Secondary | ICD-10-CM | POA: Diagnosis not present

## 2019-12-31 DIAGNOSIS — E119 Type 2 diabetes mellitus without complications: Secondary | ICD-10-CM | POA: Diagnosis not present

## 2019-12-31 DIAGNOSIS — F430 Acute stress reaction: Secondary | ICD-10-CM | POA: Diagnosis not present

## 2019-12-31 LAB — BASIC METABOLIC PANEL
Anion gap: 8 (ref 5–15)
BUN: 15 mg/dL (ref 6–20)
CO2: 23 mmol/L (ref 22–32)
Calcium: 8.9 mg/dL (ref 8.9–10.3)
Chloride: 109 mmol/L (ref 98–111)
Creatinine, Ser: 0.93 mg/dL (ref 0.61–1.24)
GFR calc Af Amer: >60 mL/min (ref >60)
GFR calc non Af Amer: >60 mL/min (ref >60)
Glucose, Bld: 132 mg/dL — ABNORMAL HIGH (ref 70–99)
Potassium: 4.3 mmol/L (ref 3.5–5.1)
Sodium: 140 mmol/L (ref 135–145)

## 2019-12-31 LAB — CBC
HCT: 48 % (ref 39.0–52.0)
Hemoglobin: 16.4 g/dL (ref 13.0–17.0)
MCH: 27.8 pg (ref 26.0–34.0)
MCHC: 34.2 g/dL (ref 30.0–36.0)
MCV: 81.5 fL (ref 80.0–100.0)
Platelets: 195 10*3/uL (ref 150–400)
RBC: 5.89 MIL/uL — ABNORMAL HIGH (ref 4.22–5.81)
RDW: 13.2 % (ref 11.5–15.5)
WBC: 7.7 10*3/uL (ref 4.0–10.5)
nRBC: 0 % (ref 0.0–0.2)

## 2019-12-31 LAB — TROPONIN I (HIGH SENSITIVITY)
Troponin I (High Sensitivity): <2 ng/L (ref <18)
Troponin I (High Sensitivity): <2 ng/L (ref <18)

## 2019-12-31 LAB — GLUCOSE, CAPILLARY: Glucose-Capillary: 119 mg/dL — ABNORMAL HIGH (ref 70–99)

## 2019-12-31 NOTE — ED Triage Notes (Signed)
Pt here for central chest pain.  Pt unsure if having panic attack because feels similar. Tingling in both hands.  Legs feel heavy.  Has had dizziness since last weekend; saw PCP and labs done but has not heard results.  Still feeling dizzy.

## 2019-12-31 NOTE — ED Provider Notes (Signed)
Repeat troponin is also normal.  Patient asymptomatic.  Return precautions and treatment recommendations and follow-up discussed with the patient who is agreeable with the plan.  Patient Dr. Lenard Lance did not participate in this patient's care as the second troponin returned while I was still present.   Sharyn Creamer, MD 12/31/19 [phone]

## 2019-12-31 NOTE — ED Notes (Signed)
Pt signed paper discharge form. Topaz not working

## 2019-12-31 NOTE — ED Provider Notes (Signed)
[facility] Emergency Department Provider Note  ____________________________________________   First MD Initiated Contact with Patient 12/31/19 1309     (approximate)  I have reviewed the triage vital signs and the nursing notes.   HISTORY  Chief Complaint Chest Pain    HPI Todd [name] is a 54 y.o. male here for evaluation after feeling tingly or weak while walking at [facility]  Patient reports that he is starting to think he probably had a panic attack.  He has had several in the past, he reports that he was at [facility], he started to feel little bit of tingly feeling.  He then started to get get tingling in his right leg and left leg.  Then started feeling tingling in both hands.  He felt a sense of a discomfort like a tightening or pressure feeling in his chest.  The symptoms are all gone away now.  Patient reports he has had same symptoms multiple times in the past, attributable now to panic attacks.  Denies a personal history of heart disease.  He does tell me that his medical record had some confusion and he has never had a prior history of prostate cancer or stroke that is listed his primary care is trying to get that out of his records  Denies weakness in any 1 arm or leg.  Reports symptoms seem to start in the right leg but then progressed to both sides and now both hands tingly but starting to feel better already  Denies Covid exposure   Past Medical History:  Diagnosis Date  . Anxiety   . 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 Chronic left shoulder pain 02/15/2016  . Depression   . Diabetes mellitus without complication (HCC)   . Dislocation of right thumb 06/05/2016  . Diverticulitis   . Gastroparesis 06/08/2017  . GERD (gastroesophageal reflux disease)   . HTN (hypertension) 05/19/2016  . Hypertension   . MDD (major depressive disorder), recurrent episode (HCC) 05/28/2016  . Neck pain, bilateral 02/15/2016  . Primary  osteoarthritis of right knee 01/31/2016   and fingers  . PTSD (post-traumatic stress disorder) 06/05/2016  . Severe recurrent major depression without psychotic features (HCC) 05/19/2016  . Sleep apnea    unable to tolerate CPAP  . Substance induced mood disorder (HCC) 05/19/2016  . Suicidal ideation 05/19/2016  . Type 2 diabetes mellitus with complication, without long-term current use of insulin (HCC) 06/08/2017  . Uncontrolled type 2 diabetes mellitus with hyperglycemia, without long-term current use of insulin (HCC) 03/12/2017    Patient Active Problem List   Diagnosis Date Noted  . Metatarsalgia of right foot 10/30/2019  . Acquired absence of extremity 10/30/2019  . Closed fracture of distal end of radius 05/31/2018  . Radiculopathy of lumbosacral region 09/30/2017  . Spondylolisthesis at L5-S1 level 09/30/2017  . Morbid obesity (HCC) 09/06/2017  . Hepatic steatosis 08/26/2017  . Non-intractable vomiting with nausea 08/26/2017  . Upper abdominal pain 08/26/2017  . 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 Gastroparesis 06/08/2017  . History of noncompliance with medical treatment 06/08/2017  . Diabetes mellitus type 2 in obese (HCC) 06/08/2017  . Uncontrolled type 2 diabetes mellitus with hyperglycemia (HCC) 03/12/2017  . 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 Dislocation of right thumb 06/05/2016  . Anxiety disorder 06/05/2016  . MDD (major depressive disorder), recurrent episode (HCC) 05/28/2016  . Substance induced mood disorder (HCC) 05/19/2016  . Severe recurrent major depression without psychotic features (HCC) 05/19/2016  . Suicidal  ideation 05/19/2016  . Essential (primary) hypertension 05/19/2016  . GERD (gastroesophageal reflux disease) 05/19/2016  . Alcohol use disorder, mild, abuse 05/19/2016  . Chronic left shoulder pain 02/15/2016  . Neck pain, bilateral 02/15/2016  . Primary osteoarthritis of right knee 01/31/2016    Past Surgical History:  Procedure Laterality Date    . BACK SURGERY  11/209/18 and 10/08/17   x 2  . COLONOSCOPY WITH PROPOFOL N/A 01/31/2018   Procedure: COLONOSCOPY WITH PROPOFOL;  Surgeon: Scot Jun, MD;  Location: [facility] ENDOSCOPY;  Service: Endoscopy;  Laterality: N/A;  . ESOPHAGOGASTRODUODENOSCOPY (EGD) WITH PROPOFOL N/A 01/31/2018   Procedure: ESOPHAGOGASTRODUODENOSCOPY (EGD) WITH PROPOFOL;  Surgeon: Scot Jun, MD;  Location: [facility] ENDOSCOPY;  Service: Endoscopy;  Laterality: N/A;  . LEG AMPUTATION BELOW KNEE     left  . SHOULDER SURGERY Left     Prior to Admission medications   Medication Sig Start Date End Date Taking? Authorizing Provider  atorvastatin (LIPITOR) 40 MG tablet Take 40 mg by mouth at bedtime.  08/10/19  Yes [provider]  cloNIDine (CATAPRES) 0.2 MG tablet Take 0.2 mg by mouth daily.  04/26/17  Yes [provider]  FARXIGA 10 MG TABS tablet Take 10 mg by mouth daily. 10/23/19  Yes [provider]  lisinopril (PRINIVIL,ZESTRIL) 40 MG tablet Take 1 tablet (40 mg total) by mouth daily. 05/20/16  Yes Pucilowska, Jolanta B, MD  Multiple Vitamin (MULTIVITAMIN) tablet Take 1 tablet by mouth daily.   Yes [provider]  omeprazole (PRILOSEC) 40 MG capsule Take 40 mg by mouth 2 (two) times daily. 11/30/19  Yes [provider]  oxybutynin (DITROPAN-XL) 10 MG 24 hr tablet Take 10 mg by mouth daily. 12/20/19  Yes [provider]  sitaGLIPtin (JANUVIA) 100 MG tablet TAKE 1 TABLET(100 MG) BY MOUTH EVERY DAY 10/20/19  Yes [provider]  ibuprofen (ADVIL,MOTRIN) 600 MG tablet Take 1 tablet (600 mg total) every 8 (eight) hours as needed by mouth. 08/26/17   Merrily Brittle, MD  metFORMIN (GLUCOPHAGE) 500 MG tablet Take 1,500 mg by mouth daily with supper.    [provider]  naproxen (NAPROSYN) 500 MG tablet Take 1 tablet (500 mg total) by mouth 2 (two) times daily with a meal. Patient not taking: Reported on 12/31/2019 11/05/18   Sharman Cheek, MD   [facility] VERIO test strip  06/23/17   [provider]    Allergies Bactrim [sulfamethoxazole-trimethoprim], Myrbetriq [mirabegron], Benadryl [diphenhydramine], Diphenhydramine hcl, and Tamsulosin  Family History  Problem Relation Age of Onset  . Diabetes Mother     Social History Social History   Tobacco Use  . Smoking status: Former Smoker    Packs/day: 1.00    Years: 11.00    Pack years: 11.00    Types: Cigarettes    Quit date: 10/12/1988    Years since quitting: 31.2  . Smokeless tobacco: Never Used  Substance Use Topics  . Alcohol use: No  . Drug use: No    Review of Systems Constitutional: No fever/chills Eyes: No visual changes. ENT: No sore throat. Cardiovascular: Denies chest pain.  Reports he had a sense of tightness in his chest that is now resolved. Respiratory: Denies shortness of breath. Gastrointestinal: No abdominal pain.   Genitourinary: Negative for dysuria. Musculoskeletal: Negative for back pain. Skin: Negative for rash. Neurological: Negative for headaches, areas of focal weakness or numbness except as noted in HPI. Also reports has been feeling a little bit dizzy for about a week.  His primary care did some blood work form but he has not heard the results yet   ____________________________________________   PHYSICAL EXAM:  VITAL SIGNS: ED Triage Vitals  Enc Vitals Group     BP 12/31/19 1325 (!) 154/94     Pulse Rate 12/31/19 1325 75     Resp 12/31/19 1325 18     Temp 12/31/19 1325 98.3 F (36.8 C)     Temp Source 12/31/19 1325 Oral     SpO2 12/31/19 1325 98 %     Weight 12/31/19 1310 267 lb (121.1 kg)     Height 12/31/19 1310 5\' 8"  (1.727 m)     Head Circumference --      Peak Flow --      Pain Score 12/31/19 1310 5     Pain Loc --      Pain Edu? --      Excl. in [place]? --     Constitutional: Alert and oriented. Well appearing and in no acute distress. Eyes: Conjunctivae are normal. Head: Atraumatic. Nose: No  congestion/rhinnorhea. Mouth/Throat: Mucous membranes are moist. Neck: No stridor.  Cardiovascular: Normal rate, regular rhythm. Grossly normal heart sounds.  Good peripheral circulation. Respiratory: Normal respiratory effort.  No retractions. Lungs CTAB. Gastrointestinal: Soft and nontender. No distention. Musculoskeletal: No lower extremity tenderness nor edema. Neurologic:  Normal speech and language. No gross focal neurologic deficits are appreciated.  No pronator drift in either extremity.  Moves all extremities 5/5 strength, has left lower below the knee amputation.  Equal smile.  Normal cranial nerve exam.  Normal of alertness.  No loss sensation. Skin:  Skin is warm, dry and intact. No rash noted. Psychiatric: Mood and affect are normal. Speech and behavior are normal.  ____________________________________________   LABS (all labs ordered are listed, but only abnormal results are displayed)  Labs Reviewed  GLUCOSE, CAPILLARY - Abnormal; Notable for the following components:      Result Value   Glucose-Capillary 119 (*)    All other components within normal limits  CBC - Abnormal; Notable for the following components:   RBC 5.89 (*)    All other components within normal limits  BASIC METABOLIC PANEL - Abnormal; Notable for the following components:   Glucose, Bld 132 (*)    All other components within normal limits  TROPONIN I (HIGH SENSITIVITY)   ____________________________________________  EKG  ED ECG REPORT I, Delman Kitten, the attending physician, personally viewed and interpreted this ECG.  Date: 12/31/2019 EKG Time: 1320 Rate: 70 Rhythm: normal sinus rhythm QRS Axis: normal Intervals: normal ST/T Wave abnormalities: normal Narrative Interpretation: no evidence of acute ischemia  ____________________________________________  RADIOLOGY  No results found.   ____________________________________________   PROCEDURES  Procedure(s) performed:  None  Procedures  Critical Care performed: No  ____________________________________________   INITIAL IMPRESSION / ASSESSMENT AND PLAN / ED COURSE  Pertinent labs & imaging results that were available during my care of the patient were reviewed by me and considered in my medical decision making (see chart for details).   Patient presents for evaluation for episode of tingling feeling of slight tightness, symptoms he describes seem to be most consistent with same type of hyperventilation with tingling in both hands and feet accompanied by tightness in the chest that is now resolved.  So far his work-up is reassuring the fact that his symptoms all abated and the fact that he reports multiple very similar episodes which he describes as "panic attacks" causing same symptoms in the  past.  Given the patient's history, history diabetes etc., we will check labs including troponin.  Observe the patient.  I see no signs of central neurologic process, no focal neurologic deficits.  No signs or symptoms of suggest stroke or other acute neurologic, cardiac metabolic or vascular concern.  Blood sugar normal  Clinical Course as of Dec 31 1443  Sun Dec 31, 2019  1444 Patient asymptomatic at this time.  Resting comfortably.  Updated on his current lab testing and patient comfortable with plan to observe him, if second troponin is normal and he remains well anticipate discharge.   [MQ]    Clinical Course User Index [MQ] Sharyn Creamer, MD    Ongoing care and disposition assigned to Dr. Lenard Lance.  Follow-up on second troponin ____________________________________________   FINAL CLINICAL IMPRESSION(S) / ED DIAGNOSES  Final diagnoses:  Atypical chest pain  Anxiety in acute stress reaction        Note:  This document was prepared using Dragon voice recognition software and may include unintentional dictation errors       Sharyn Creamer, MD 12/31/19 1519

## 2020-01-10 ENCOUNTER — Other Ambulatory Visit: Payer: Self-pay | Admitting: Podiatry

## 2020-01-10 DIAGNOSIS — M7741 Metatarsalgia, right foot: Secondary | ICD-10-CM

## 2020-01-16 ENCOUNTER — Ambulatory Visit: Payer: Self-pay | Admitting: Urology

## 2020-01-25 ENCOUNTER — Telehealth: Payer: Self-pay | Admitting: Urology

## 2020-01-25 NOTE — Telephone Encounter (Signed)
Pt is currently in [place], [place] and won't be back until next month.  He's in a lot of pain.  He feels like he has a bladder infection.  He is having frequency and hurts with continuous bladder pain.  He has agreed to go to urgent care in [place] to leave a urine sample.  [phone]

## 2020-01-29 ENCOUNTER — Ambulatory Visit: Payer: Medicare Other | Admitting: Podiatry

## 2020-02-19 ENCOUNTER — Other Ambulatory Visit: Payer: Self-pay | Admitting: Otolaryngology

## 2020-02-19 DIAGNOSIS — R42 Dizziness and giddiness: Secondary | ICD-10-CM

## 2020-02-19 DIAGNOSIS — H9209 Otalgia, unspecified ear: Secondary | ICD-10-CM

## 2020-02-19 DIAGNOSIS — R0981 Nasal congestion: Secondary | ICD-10-CM

## 2020-02-22 ENCOUNTER — Ambulatory Visit (INDEPENDENT_AMBULATORY_CARE_PROVIDER_SITE_OTHER): Payer: Medicare Other | Admitting: Urology

## 2020-02-22 ENCOUNTER — Encounter: Payer: Self-pay | Admitting: Urology

## 2020-02-22 ENCOUNTER — Other Ambulatory Visit: Payer: Self-pay

## 2020-02-22 VITALS — BP 132/84 | HR 69 | Ht 68.0 in | Wt 261.2 lb

## 2020-02-22 DIAGNOSIS — N3010 Interstitial cystitis (chronic) without hematuria: Secondary | ICD-10-CM | POA: Diagnosis not present

## 2020-02-22 DIAGNOSIS — N401 Enlarged prostate with lower urinary tract symptoms: Secondary | ICD-10-CM | POA: Diagnosis not present

## 2020-02-22 DIAGNOSIS — N138 Other obstructive and reflux uropathy: Secondary | ICD-10-CM

## 2020-02-22 LAB — URINALYSIS, COMPLETE
Bilirubin, UA: NEGATIVE
Ketones, UA: NEGATIVE
Leukocytes,UA: NEGATIVE
Nitrite, UA: NEGATIVE
Protein,UA: NEGATIVE
RBC, UA: NEGATIVE
Specific Gravity, UA: 1.02 (ref 1.005–1.030)
Urobilinogen, Ur: 0.2 mg/dL (ref 0.2–1.0)
pH, UA: 5.5 (ref 5.0–7.5)

## 2020-02-22 LAB — MICROSCOPIC EXAMINATION
Bacteria, UA: NONE SEEN
Epithelial Cells (non renal): NONE SEEN /hpf (ref 0–10)
RBC, Urine: NONE SEEN /hpf (ref 0–2)

## 2020-02-22 LAB — BLADDER SCAN AMB NON-IMAGING: Scan Result: 45

## 2020-02-22 MED ORDER — TAMSULOSIN HCL 0.4 MG PO CAPS: 0.4000 mg | ORAL_CAPSULE | Freq: Every day | ORAL | 3 refills | Status: DC
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
## 2020-02-29 ENCOUNTER — Ambulatory Visit
Admission: RE | Admit: 2020-02-29 | Discharge: 2020-02-29 | Disposition: A | Discharge status: Home / Self Care | Payer: Medicare Other | Source: Ambulatory Visit | Attending: Otolaryngology | Admitting: Otolaryngology

## 2020-02-29 ENCOUNTER — Other Ambulatory Visit: Payer: Self-pay

## 2020-02-29 DIAGNOSIS — R0981 Nasal congestion: Secondary | ICD-10-CM

## 2020-02-29 DIAGNOSIS — R42 Dizziness and giddiness: Secondary | ICD-10-CM | POA: Diagnosis present

## 2020-02-29 DIAGNOSIS — H9209 Otalgia, unspecified ear: Secondary | ICD-10-CM | POA: Diagnosis present

## 2020-02-29 MED ORDER — GADOBUTROL 1 MMOL/ML IV SOLN
10.0000 mL | Freq: Once | INTRAVENOUS | Status: AC | PRN
  Administered 2020-02-29: 10 mL via INTRAVENOUS

## 2020-03-16 ENCOUNTER — Emergency Department: Payer: Medicare Other

## 2020-03-16 ENCOUNTER — Emergency Department
Admission: EM | Admit: 2020-03-16 | Discharge: 2020-03-16 | Disposition: A | Discharge status: Home / Self Care | Payer: Medicare Other | Attending: Emergency Medicine | Admitting: Emergency Medicine

## 2020-03-16 ENCOUNTER — Encounter: Payer: Self-pay | Admitting: Emergency Medicine

## 2020-03-16 ENCOUNTER — Other Ambulatory Visit: Payer: Self-pay

## 2020-03-16 DIAGNOSIS — Z79899 Other long term (current) drug therapy: Secondary | ICD-10-CM | POA: Diagnosis not present

## 2020-03-16 DIAGNOSIS — H9201 Otalgia, right ear: Secondary | ICD-10-CM | POA: Insufficient documentation

## 2020-03-16 DIAGNOSIS — Z7984 Long term (current) use of oral hypoglycemic drugs: Secondary | ICD-10-CM | POA: Insufficient documentation

## 2020-03-16 DIAGNOSIS — R079 Chest pain, unspecified: Secondary | ICD-10-CM | POA: Diagnosis not present

## 2020-03-16 DIAGNOSIS — E119 Type 2 diabetes mellitus without complications: Secondary | ICD-10-CM | POA: Insufficient documentation

## 2020-03-16 DIAGNOSIS — Z89512 Acquired absence of left leg below knee: Secondary | ICD-10-CM | POA: Insufficient documentation

## 2020-03-16 DIAGNOSIS — R110 Nausea: Secondary | ICD-10-CM | POA: Insufficient documentation

## 2020-03-16 DIAGNOSIS — R519 Headache, unspecified: Secondary | ICD-10-CM | POA: Insufficient documentation

## 2020-03-16 DIAGNOSIS — I10 Essential (primary) hypertension: Secondary | ICD-10-CM | POA: Diagnosis not present

## 2020-03-16 DIAGNOSIS — Z87891 Personal history of nicotine dependence: Secondary | ICD-10-CM | POA: Diagnosis not present

## 2020-03-16 DIAGNOSIS — R42 Dizziness and giddiness: Secondary | ICD-10-CM | POA: Insufficient documentation

## 2020-03-16 LAB — BASIC METABOLIC PANEL
Anion gap: 9 (ref 5–15)
BUN: 20 mg/dL (ref 6–20)
CO2: 25 mmol/L (ref 22–32)
Calcium: 9.3 mg/dL (ref 8.9–10.3)
Chloride: 106 mmol/L (ref 98–111)
Creatinine, Ser: 1.29 mg/dL — ABNORMAL HIGH (ref 0.61–1.24)
GFR calc Af Amer: >60 mL/min (ref >60)
GFR calc non Af Amer: >60 mL/min (ref >60)
Glucose, Bld: 165 mg/dL — ABNORMAL HIGH (ref 70–99)
Potassium: 3.4 mmol/L — ABNORMAL LOW (ref 3.5–5.1)
Sodium: 140 mmol/L (ref 135–145)

## 2020-03-16 LAB — CBC
HCT: 47.9 % (ref 39.0–52.0)
Hemoglobin: 16.3 g/dL (ref 13.0–17.0)
MCH: 27.4 pg (ref 26.0–34.0)
MCHC: 34 g/dL (ref 30.0–36.0)
MCV: 80.6 fL (ref 80.0–100.0)
Platelets: 244 10*3/uL (ref 150–400)
RBC: 5.94 MIL/uL — ABNORMAL HIGH (ref 4.22–5.81)
RDW: 13.2 % (ref 11.5–15.5)
WBC: 8.3 10*3/uL (ref 4.0–10.5)
nRBC: 0 % (ref 0.0–0.2)

## 2020-03-16 LAB — TROPONIN I (HIGH SENSITIVITY)
Troponin I (High Sensitivity): 4 ng/L (ref <18)
Troponin I (High Sensitivity): 4 ng/L (ref <18)

## 2020-03-16 MED ORDER — SODIUM CHLORIDE 0.9% FLUSH: 3.0000 mL | Freq: Once | INTRAVENOUS | Status: DC

## 2020-03-16 MED ORDER — MECLIZINE HCL 25 MG PO TABS: 25.0000 mg | ORAL_TABLET | Freq: Three times a day (TID) | ORAL | 0 refills | Status: DC | PRN

## 2020-03-16 NOTE — ED Provider Notes (Signed)
[facility] Emergency Department Provider Note   ____________________________________________   I have reviewed the triage vital signs and the nursing notes.   HISTORY  Chief Complaint Dizziness, Chest Pain, and Headache   History limited by: Not Limited   HPI Todd [name] is a 54 y.o. male who presents to the emergency department today with primary concerns for dizziness.  He states the dizziness has been present for the past 2 months.  The patient states that he feels it most when he moves around.  He gives example of turning over in bed while he was watching a movie and picking up to get a bottle.  When he gets this dizziness it is accompanied by some nausea.  He also states that he has somewhat chronic headaches as well as chest pain.  The patient says that he has been seen for these issues.  He did recently undergo an MRI.  He states they have not yet been able to find an answer.  He says he has no medication at home specifically for dizziness although has nausea medication that does help.  Records reviewed. Per medical record review patient has a history of recent MRI brain for dizziness, without acute findings.   Past Medical History:  Diagnosis Date  . Anxiety   . 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 Chronic left shoulder pain 02/15/2016  . Depression   . Diabetes mellitus without complication (HCC)   . Dislocation of right thumb 06/05/2016  . Diverticulitis   . Gastroparesis 06/08/2017  . GERD (gastroesophageal reflux disease)   . HTN (hypertension) 05/19/2016  . Hypertension   . MDD (major depressive disorder), recurrent episode (HCC) 05/28/2016  . Neck pain, bilateral 02/15/2016  . Primary osteoarthritis of right knee 01/31/2016   and fingers  . PTSD (post-traumatic stress disorder) 06/05/2016  . Severe recurrent major depression without psychotic features (HCC) 05/19/2016  . Sleep apnea    unable to tolerate CPAP  . Substance induced  mood disorder (HCC) 05/19/2016  . Suicidal ideation 05/19/2016  . Type 2 diabetes mellitus with complication, without long-term current use of insulin (HCC) 06/08/2017  . Uncontrolled type 2 diabetes mellitus with hyperglycemia, without long-term current use of insulin (HCC) 03/12/2017    Patient Active Problem List   Diagnosis Date Noted  . Obesity (BMI 35.0-39.9 without comorbidity) 12/14/2019  . Metatarsalgia of right foot 10/30/2019  . Acquired absence of extremity 10/30/2019  . Closed fracture of distal end of radius 05/31/2018  . Radiculopathy of lumbosacral region 09/30/2017  . Spondylolisthesis at L5-S1 level 09/30/2017  . Morbid obesity (HCC) 09/06/2017  . Hepatic steatosis 08/26/2017  . Non-intractable vomiting with nausea 08/26/2017  . Upper abdominal pain 08/26/2017  . 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 Gastroparesis 06/08/2017  . History of noncompliance with medical treatment 06/08/2017  . Diabetes mellitus type 2 in obese (HCC) 06/08/2017  . Uncontrolled type 2 diabetes mellitus with hyperglycemia (HCC) 03/12/2017  . 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 Dislocation of right thumb 06/05/2016  . Anxiety disorder 06/05/2016  . MDD (major depressive disorder), recurrent episode (HCC) 05/28/2016  . Substance induced mood disorder (HCC) 05/19/2016  . Severe recurrent major depression without psychotic features (HCC) 05/19/2016  . Suicidal ideation 05/19/2016  . Essential (primary) hypertension 05/19/2016  . GERD (gastroesophageal reflux disease) 05/19/2016  . Alcohol use disorder, mild, abuse 05/19/2016  . Chronic left shoulder pain 02/15/2016  . Neck pain, bilateral 02/15/2016  . Primary osteoarthritis of right knee 01/31/2016  Past Surgical History:  Procedure Laterality Date  . BACK SURGERY  11/209/18 and 10/08/17   x 2  . COLONOSCOPY WITH PROPOFOL N/A 01/31/2018   Procedure: COLONOSCOPY WITH PROPOFOL;  Surgeon: Scot Jun, MD;  Location[facility] ENDOSCOPY;   Service: Endoscopy;  Laterality: N/A;  . ESOPHAGOGASTRODUODENOSCOPY (EGD) WITH PROPOFOL N/A 01/31/2018   Procedure: ESOPHAGOGASTRODUODENOSCOPY (EGD) WITH PROPOFOL;  Surgeon: Scot Jun, MD;  Location: [facility] ENDOSCOPY;  Service: Endoscopy;  Laterality: N/A;  . LEG AMPUTATION BELOW KNEE     left  . SHOULDER SURGERY Left     Prior to Admission medications   Medication Sig Start Date End Date Taking? Authorizing Provider  amoxicillin-clavulanate (AUGMENTIN) 875-125 MG tablet Take 1 tablet by mouth 2 (two) times daily. 02/19/20   [provider]  atorvastatin (LIPITOR) 40 MG tablet Take 40 mg by mouth at bedtime.  08/10/19   [provider]  cloNIDine (CATAPRES) 0.2 MG tablet Take 0.2 mg by mouth at bedtime.  04/26/17   [provider]  FARXIGA 10 MG TABS tablet Take 10 mg by mouth daily. 10/23/19   [provider]  glipiZIDE (GLUCOTROL XL) 10 MG 24 hr tablet Take 10 mg by mouth daily. 02/16/20   [provider]  ibuprofen (ADVIL,MOTRIN) 600 MG tablet Take 1 tablet (600 mg total) every 8 (eight) hours as needed by mouth. 08/26/17   Merrily Brittle, MD  lisinopril (PRINIVIL,ZESTRIL) 40 MG tablet Take 1 tablet (40 mg total) by mouth daily. 05/20/16   Pucilowska, Braulio Conte B, MD  Multiple Vitamin (MULTIVITAMIN) tablet Take 1 tablet by mouth daily.    [provider]  naproxen (NAPROSYN) 500 MG tablet Take 1 tablet (500 mg total) by mouth 2 (two) times daily with a meal. Patient not taking: Reported on 02/22/2020 11/05/18   Sharman Cheek, MD  omeprazole (PRILOSEC) 40 MG capsule Take 40 mg by mouth 2 (two) times daily. 11/30/19   [provider]  oxybutynin (DITROPAN-XL) 10 MG 24 hr tablet Take 10 mg by mouth daily. 12/20/19   [provider]  sitaGLIPtin (JANUVIA) 100 MG tablet TAKE 1 TABLET(100 MG) BY MOUTH EVERY DAY 10/20/19   [provider]  tamsulosin (FLOMAX) 0.4 MG CAPS capsule Take 1 capsule (0.4 mg total) by mouth  daily. 02/22/20   Michiel Cowboy A, PA-C    Allergies Bactrim [sulfamethoxazole-trimethoprim], Myrbetriq [mirabegron], Benadryl [diphenhydramine], Diphenhydramine hcl, and Tamsulosin  Family History  Problem Relation Age of Onset  . Diabetes Mother     Social History Social History   Tobacco Use  . Smoking status: Former Smoker    Packs/day: 1.00    Years: 11.00    Pack years: 11.00    Types: Cigarettes    Quit date: 10/12/1988    Years since quitting: 31.4  . Smokeless tobacco: Never Used  Substance Use Topics  . Alcohol use: No  . Drug use: No    Review of Systems Constitutional: No fever/chills Eyes: No visual changes. ENT: No sore throat. Cardiovascular: Positive for chest pain.  Respiratory: Denies shortness of breath. Gastrointestinal: No abdominal pain.  Positive for nausea.   Genitourinary: Negative for dysuria. Musculoskeletal: Negative for back pain. Skin: Negative for rash. Neurological: Positive for headaches and dizziness.  ____________________________________________   PHYSICAL EXAM:  VITAL SIGNS: ED Triage Vitals  Enc Vitals Group     BP 03/16/20 1847 (!) 150/98     Pulse Rate 03/16/20 1847 95     Resp 03/16/20 1847 16  Temp 03/16/20 1848 98.3 F (36.8 C)     Temp Source 03/16/20 1848 Oral     SpO2 03/16/20 1847 97 %     Weight 03/16/20 1848 269 lb (122 kg)     Height 03/16/20 1848 5\' 8"  (1.727 m)     Head Circumference --      Peak Flow --      Pain Score 03/16/20 1848 6   Constitutional: Alert and oriented.  Eyes: Conjunctivae are normal.  ENT      Head: Normocephalic and atraumatic.      Nose: No congestion/rhinnorhea.      Mouth/Throat: Mucous membranes are moist.      Neck: No stridor. Hematological/Lymphatic/Immunilogical: No cervical lymphadenopathy. Cardiovascular: Normal rate, regular rhythm.  No murmurs, rubs, or gallops. Respiratory: Normal respiratory effort without tachypnea nor retractions. Breath sounds are clear and  equal bilaterally. No wheezes/rales/rhonchi. Gastrointestinal: Soft and non tender. No rebound. No guarding.  Genitourinary: Deferred Musculoskeletal: Normal range of motion in all extremities. No lower extremity edema. Neurologic:  Normal speech and language. No gross focal neurologic deficits are appreciated.  Skin:  Skin is warm, dry and intact. No rash noted. Psychiatric: Mood and affect are normal. Speech and behavior are normal. Patient exhibits appropriate insight and judgment.  ____________________________________________    LABS (pertinent positives/negatives)  Trop hs 4 x 2 BMP na 140, k 3.4, glu 165, cr 1.29 CBC wbc 8.3, hgb 16.3, plt 244  ____________________________________________   EKG  I, Nance Pear, attending physician, personally viewed and interpreted this EKG  EKG Time: 1845 Rate: 90 Rhythm: normal sinus rhythm Axis: left axis deviation Intervals: qtc 423 QRS: LAFB ST changes: no st elevation Impression: abnormal ekg  ____________________________________________    RADIOLOGY  CXR No active cardiopulmonary disease  ____________________________________________   PROCEDURES  Procedures  ____________________________________________   INITIAL IMPRESSION / ASSESSMENT AND PLAN / ED COURSE  Pertinent labs & imaging results that were available during my care of the patient were reviewed by me and considered in my medical decision making (see chart for details).   Patient presented to the emergency department today because of concerns for dizziness as well as some lesser concerns for headache and chest pain.  Patient's work-up here without concerning findings.  I did review the medical record he did recently undergo MRI.  At this point his symptoms do sound somewhat vertiginous in origin.  Given negative work-up here do think it is reasonable to discharge home. Will give prescription for meclizine.    ____________________________________________   FINAL CLINICAL IMPRESSION(S) / ED DIAGNOSES  Final diagnoses:  Dizziness  Chest pain, unspecified type     Note: This dictation was prepared with Dragon dictation. Any transcriptional errors that result from this process are unintentional     Nance Pear, MD 03/16/20 2236

## 2020-03-16 NOTE — Discharge Instructions (Addendum)
Please seek medical attention for any high fevers, chest pain, shortness of breath, change in behavior, persistent vomiting, bloody stool or any other new or concerning symptoms.  

## 2020-03-16 NOTE — ED Notes (Addendum)
Pt states coming in due to dizziness and right ear pain. Pt states he has chronic dizziness with some anxiety. Pt states the reason he came in was because it seemed that the dizziness has gotten worse. Pt states he is feeling good right now though

## 2020-03-16 NOTE — ED Triage Notes (Signed)
Pt to ED via POV c/o dizziness. PT states that he has had dizziness on and off x 2 months, has had multiple work ups that have been negative. Pt states that normally the dizziness comes and goes but he has consistently been dizzy since 1700. Pt states that he also has chest pain that started 1 hour PTA. Pt states that pain has been consistent for the last hour. Pain is in the center of his chest and does not radiate. Pt states that he also has a headache, pt states that his headache started about 30 minutes ago. Pt is in NAD.

## 2020-03-27 ENCOUNTER — Ambulatory Visit: Payer: Self-pay | Admitting: Urology

## 2020-04-10 ENCOUNTER — Other Ambulatory Visit: Payer: Self-pay

## 2020-04-10 ENCOUNTER — Encounter: Payer: Self-pay | Admitting: Otolaryngology

## 2020-04-12 ENCOUNTER — Other Ambulatory Visit: Payer: Self-pay

## 2020-04-12 ENCOUNTER — Ambulatory Visit: Payer: Medicare Other | Attending: Internal Medicine

## 2020-04-12 ENCOUNTER — Other Ambulatory Visit
Admission: RE | Admit: 2020-04-12 | Discharge: 2020-04-12 | Disposition: A | Discharge status: Home / Self Care | Payer: Medicare Other | Source: Ambulatory Visit | Attending: Otolaryngology | Admitting: Otolaryngology

## 2020-04-12 DIAGNOSIS — Z01812 Encounter for preprocedural laboratory examination: Secondary | ICD-10-CM | POA: Diagnosis present

## 2020-04-12 DIAGNOSIS — Z20822 Contact with and (suspected) exposure to covid-19: Secondary | ICD-10-CM | POA: Insufficient documentation

## 2020-04-12 LAB — SARS CORONAVIRUS 2 (TAT 6-24 HRS): SARS Coronavirus 2: NEGATIVE

## 2020-04-16 ENCOUNTER — Other Ambulatory Visit: Payer: Medicare Other

## 2020-04-16 NOTE — Anesthesia Preprocedure Evaluation (Addendum)
Anesthesia Evaluation  Patient identified by MRN, date of birth, ID band Patient awake    Reviewed: Allergy & Precautions, NPO status , Patient's Chart, lab work & pertinent test results, reviewed documented beta blocker date and time   History of Anesthesia Complications Negative for: history of anesthetic complications  Airway Mallampati: III  TM Distance: >3 FB Neck ROM: Full    Dental   Pulmonary sleep apnea , former smoker,    breath sounds clear to auscultation       Cardiovascular hypertension, (-) angina(-) DOE  Rhythm:Regular Rate:Normal     Neuro/Psych  Headaches, PSYCHIATRIC DISORDERS (PTSD) Anxiety Depression  Neuromuscular disease (Lumbosacral radiculopathy)    GI/Hepatic GERD  Controlled and Medicated,(+)     substance abuse (Denies recent use)  alcohol use,  Gastroparesis Diverticulosis   Endo/Other  diabetes, Type 2  Renal/GU      Musculoskeletal  (+) Arthritis ,   Abdominal (+) + obese (BMI 40),   Peds  Hematology   Anesthesia Other Findings   Reproductive/Obstetrics                            Anesthesia Physical Anesthesia Plan  ASA: III  Anesthesia Plan: General   Post-op Pain Management:    Induction: Intravenous  PONV Risk Score and Plan: 3 and Treatment may vary due to age or medical condition, Ondansetron, Dexamethasone, Midazolam and Scopolamine patch - Pre-op  Airway Management Planned: Oral ETT  Additional Equipment:   Intra-op Plan:   Post-operative Plan: Extubation in OR  Informed Consent: I have reviewed the patients History and Physical, chart, labs and discussed the procedure including the risks, benefits and alternatives for the proposed anesthesia with the patient or authorized representative who has indicated his/her understanding and acceptance.       Plan Discussed with: CRNA and Anesthesiologist  Anesthesia Plan Comments:          Anesthesia Quick Evaluation

## 2020-04-17 ENCOUNTER — Ambulatory Visit: Payer: Medicare Other | Admitting: Anesthesiology

## 2020-04-17 ENCOUNTER — Other Ambulatory Visit: Payer: Self-pay

## 2020-04-17 ENCOUNTER — Encounter: Payer: Self-pay | Admitting: Otolaryngology

## 2020-04-17 ENCOUNTER — Encounter
Admission: RE | Disposition: A | Discharge status: Home / Self Care | Payer: Self-pay | Source: Home / Self Care | Attending: Otolaryngology

## 2020-04-17 ENCOUNTER — Ambulatory Visit
Admission: RE | Admit: 2020-04-17 | Discharge: 2020-04-17 | Disposition: A | Discharge status: Home / Self Care | Payer: Medicare Other | Attending: Otolaryngology | Admitting: Otolaryngology

## 2020-04-17 DIAGNOSIS — E669 Obesity, unspecified: Secondary | ICD-10-CM | POA: Diagnosis not present

## 2020-04-17 DIAGNOSIS — K219 Gastro-esophageal reflux disease without esophagitis: Secondary | ICD-10-CM | POA: Insufficient documentation

## 2020-04-17 DIAGNOSIS — J342 Deviated nasal septum: Secondary | ICD-10-CM | POA: Diagnosis present

## 2020-04-17 DIAGNOSIS — Z7984 Long term (current) use of oral hypoglycemic drugs: Secondary | ICD-10-CM | POA: Diagnosis not present

## 2020-04-17 DIAGNOSIS — Z6841 Body Mass Index (BMI) 40.0 and over, adult: Secondary | ICD-10-CM | POA: Insufficient documentation

## 2020-04-17 DIAGNOSIS — Z87891 Personal history of nicotine dependence: Secondary | ICD-10-CM | POA: Insufficient documentation

## 2020-04-17 DIAGNOSIS — Z79899 Other long term (current) drug therapy: Secondary | ICD-10-CM | POA: Insufficient documentation

## 2020-04-17 DIAGNOSIS — F4310 Post-traumatic stress disorder, unspecified: Secondary | ICD-10-CM | POA: Insufficient documentation

## 2020-04-17 DIAGNOSIS — E1143 Type 2 diabetes mellitus with diabetic autonomic (poly)neuropathy: Secondary | ICD-10-CM | POA: Insufficient documentation

## 2020-04-17 DIAGNOSIS — G4730 Sleep apnea, unspecified: Secondary | ICD-10-CM | POA: Diagnosis not present

## 2020-04-17 DIAGNOSIS — K5790 Diverticulosis of intestine, part unspecified, without perforation or abscess without bleeding: Secondary | ICD-10-CM | POA: Insufficient documentation

## 2020-04-17 DIAGNOSIS — J343 Hypertrophy of nasal turbinates: Secondary | ICD-10-CM | POA: Insufficient documentation

## 2020-04-17 DIAGNOSIS — E119 Type 2 diabetes mellitus without complications: Secondary | ICD-10-CM | POA: Diagnosis not present

## 2020-04-17 DIAGNOSIS — K3184 Gastroparesis: Secondary | ICD-10-CM | POA: Diagnosis not present

## 2020-04-17 DIAGNOSIS — M5416 Radiculopathy, lumbar region: Secondary | ICD-10-CM | POA: Insufficient documentation

## 2020-04-17 DIAGNOSIS — M1990 Unspecified osteoarthritis, unspecified site: Secondary | ICD-10-CM | POA: Insufficient documentation

## 2020-04-17 HISTORY — DX: Presence of artificial limb (complete) (partial), unspecified: Z97.10

## 2020-04-17 HISTORY — PX: NASAL SEPTOPLASTY W/ TURBINOPLASTY: SHX2070

## 2020-04-17 HISTORY — DX: Dizziness and giddiness: R42

## 2020-04-17 HISTORY — DX: Headache, unspecified: R51.9

## 2020-04-17 LAB — GLUCOSE, CAPILLARY
Glucose-Capillary: 140 mg/dL — ABNORMAL HIGH (ref 70–99)
Glucose-Capillary: 176 mg/dL — ABNORMAL HIGH (ref 70–99)

## 2020-04-17 SURGERY — SEPTOPLASTY, NOSE, WITH NASAL TURBINATE REDUCTION
Anesthesia: General | Laterality: Bilateral

## 2020-04-17 MED ORDER — FENTANYL CITRATE (PF) 100 MCG/2ML IJ SOLN
INTRAMUSCULAR | Status: DC | PRN
  Administered 2020-04-17 (×2): 50 ug via INTRAVENOUS

## 2020-04-17 MED ORDER — ACETAMINOPHEN 10 MG/ML IV SOLN
1000.0000 mg | Freq: Once | INTRAVENOUS | Status: AC
  Administered 2020-04-17: 1000 mg via INTRAVENOUS

## 2020-04-17 MED ORDER — AMOXICILLIN-POT CLAVULANATE 875-125 MG PO TABS
1.0000 | ORAL_TABLET | Freq: Two times a day (BID) | ORAL | 0 refills | Status: DC
Start: 2020-04-17 — End: 2020-08-05

## 2020-04-17 MED ORDER — OXYCODONE-ACETAMINOPHEN 5-325 MG PO TABS: 1.0000 | ORAL_TABLET | Freq: Four times a day (QID) | ORAL | 0 refills | Status: AC | PRN

## 2020-04-17 MED ORDER — LACTATED RINGERS IV SOLN: INTRAVENOUS | Status: DC

## 2020-04-17 MED ORDER — ONDANSETRON HCL 4 MG/2ML IJ SOLN
INTRAMUSCULAR | Status: DC | PRN
  Administered 2020-04-17: 4 mg via INTRAVENOUS

## 2020-04-17 MED ORDER — OXYMETAZOLINE HCL 0.05 % NA SOLN
NASAL | Status: DC | PRN
  Administered 2020-04-17: 1 via TOPICAL

## 2020-04-17 MED ORDER — BACITRACIN 500 UNIT/GM EX OINT
TOPICAL_OINTMENT | CUTANEOUS | Status: DC | PRN
  Administered 2020-04-17: 1 via TOPICAL

## 2020-04-17 MED ORDER — ONDANSETRON HCL 4 MG PO TABS: 4.0000 mg | ORAL_TABLET | Freq: Three times a day (TID) | ORAL | 0 refills | Status: DC | PRN

## 2020-04-17 MED ORDER — MEPERIDINE HCL 25 MG/ML IJ SOLN: 6.2500 mg | INTRAMUSCULAR | Status: DC | PRN

## 2020-04-17 MED ORDER — PROPOFOL 10 MG/ML IV BOLUS
INTRAVENOUS | Status: DC | PRN
  Administered 2020-04-17: 200 mg via INTRAVENOUS

## 2020-04-17 MED ORDER — MIDAZOLAM HCL 5 MG/5ML IJ SOLN
INTRAMUSCULAR | Status: DC | PRN
  Administered 2020-04-17: 2 mg via INTRAVENOUS

## 2020-04-17 MED ORDER — LIDOCAINE HCL (CARDIAC) PF 100 MG/5ML IV SOSY
PREFILLED_SYRINGE | INTRAVENOUS | Status: DC | PRN
  Administered 2020-04-17: 50 mg via INTRAVENOUS

## 2020-04-17 MED ORDER — DEXMEDETOMIDINE HCL 200 MCG/2ML IV SOLN
INTRAVENOUS | Status: DC | PRN
  Administered 2020-04-17: 5 ug via INTRAVENOUS
  Administered 2020-04-17: 10 ug via INTRAVENOUS

## 2020-04-17 MED ORDER — OXYCODONE HCL 5 MG PO TABS
5.0000 mg | ORAL_TABLET | Freq: Once | ORAL | Status: AC | PRN
  Administered 2020-04-17: 5 mg via ORAL

## 2020-04-17 MED ORDER — HYDROMORPHONE HCL 1 MG/ML IJ SOLN: 0.2500 mg | INTRAMUSCULAR | Status: DC | PRN

## 2020-04-17 MED ORDER — GLYCOPYRROLATE 0.2 MG/ML IJ SOLN
INTRAMUSCULAR | Status: DC | PRN
  Administered 2020-04-17: .1 mg via INTRAVENOUS

## 2020-04-17 MED ORDER — DEXAMETHASONE SODIUM PHOSPHATE 4 MG/ML IJ SOLN
INTRAMUSCULAR | Status: DC | PRN
  Administered 2020-04-17: 4 mg via INTRAVENOUS

## 2020-04-17 MED ORDER — OXYCODONE HCL 5 MG/5ML PO SOLN: 5.0000 mg | Freq: Once | ORAL | Status: AC | PRN

## 2020-04-17 MED ORDER — LIDOCAINE-EPINEPHRINE 1 %-1:100000 IJ SOLN
INTRAMUSCULAR | Status: DC | PRN
  Administered 2020-04-17: 6 mL

## 2020-04-17 MED ORDER — SUCCINYLCHOLINE CHLORIDE 20 MG/ML IJ SOLN
INTRAMUSCULAR | Status: DC | PRN
  Administered 2020-04-17: 100 mg via INTRAVENOUS

## 2020-04-17 MED ORDER — SCOPOLAMINE 1 MG/3DAYS TD PT72
1.0000 | MEDICATED_PATCH | TRANSDERMAL | Status: DC
  Administered 2020-04-17: 1.5 mg via TRANSDERMAL

## 2020-04-17 MED ORDER — PROMETHAZINE HCL 25 MG/ML IJ SOLN: 6.2500 mg | INTRAMUSCULAR | Status: DC | PRN

## 2020-04-17 SURGICAL SUPPLY — 25 items
CANISTER SUCT 1200ML W/VALVE (MISCELLANEOUS) ×3 IMPLANT
COAG SUCT 10F 3.5MM HAND CTRL (MISCELLANEOUS) ×3 IMPLANT
DRESSING NASL FOAM PST OP SINU (MISCELLANEOUS) ×1 IMPLANT
DRSG NASAL FOAM POST OP SINU (MISCELLANEOUS) ×3
ELECT REM PT RETURN 9FT ADLT (ELECTROSURGICAL) ×3
ELECTRODE REM PT RTRN 9FT ADLT (ELECTROSURGICAL) ×1 IMPLANT
GLOVE BIO SURGEON STRL SZ7.5 (GLOVE) ×6 IMPLANT
KIT TURNOVER KIT A (KITS) ×3 IMPLANT
NEEDLE HYPO 25GX1X1/2 BEV (NEEDLE) ×3 IMPLANT
PACK ENT CUSTOM (PACKS) ×3 IMPLANT
PATTIES SURGICAL .5 X3 (DISPOSABLE) ×3 IMPLANT
SOL ANTI-FOG 6CC FOG-OUT (MISCELLANEOUS) ×1 IMPLANT
SOL FOG-OUT ANTI-FOG 6CC (MISCELLANEOUS) ×2
SPLINT NASAL SEPTAL BLV .50 ST (MISCELLANEOUS) ×3 IMPLANT
STRAP BODY AND KNEE 60X3 (MISCELLANEOUS) ×3 IMPLANT
SUT CHROMIC 4 0 RB 1X27 (SUTURE) ×3 IMPLANT
SUT ETHILON 3-0 FS-10 30 BLK (SUTURE) ×3
SUT ETHILON 4-0 (SUTURE)
SUT ETHILON 4-0 FS2 18XMFL BLK (SUTURE)
SUT PLAIN GUT 4-0 (SUTURE) IMPLANT
SUTURE EHLN 3-0 FS-10 30 BLK (SUTURE) ×1 IMPLANT
SUTURE ETHLN 4-0 FS2 18XMF BLK (SUTURE) IMPLANT
SYR 10ML LL (SYRINGE) ×3 IMPLANT
TOWEL OR 17X26 4PK STRL BLUE (TOWEL DISPOSABLE) ×3 IMPLANT
WATER STERILE IRR 250ML POUR (IV SOLUTION) IMPLANT

## 2020-04-17 NOTE — Anesthesia Procedure Notes (Signed)
Procedure Name: Intubation Date/Time: 04/17/2020 9:06 AM Performed by: Jinny Blossom, CRNA Pre-anesthesia Checklist: Patient identified, Emergency Drugs available, Suction available, Patient being monitored and Timeout performed Patient Re-evaluated:Patient Re-evaluated prior to induction Oxygen Delivery Method: Circle system utilized Preoxygenation: Pre-oxygenation with 100% oxygen Induction Type: IV induction Ventilation: Oral airway inserted - appropriate to patient size and Mask ventilation without difficulty Laryngoscope Size: Miller and 2 Grade View: Grade II Tube type: Oral Rae Tube size: 7.5 mm Number of attempts: 1 Placement Confirmation: ETT inserted through vocal cords under direct vision,  positive ETCO2 and breath sounds checked- equal and bilateral Tube secured with: Tape Dental Injury: Teeth and Oropharynx as per pre-operative assessment

## 2020-04-17 NOTE — Op Note (Signed)
..04/17/2020  9:57 AM    Todd [name]  [id number]    Pre-Op Dx:  Deviated Nasal Septum, Hypertrophic Inferior Turbinates  Post-op Dx: Same  Proc: Nasal Septoplasty, Bilateral Partial Reduction Inferior Turbinates   Surg:  Deby Adger  Anes:  GOT  EBL:  <63ml  Comp:  none  Findings: Right sided septal deviation with bilateral soft tissue and bone inferior turbinate hypertrophy.  Procedure: With the patient in a comfortable supine position,  general orotracheal anesthesia was induced without difficulty.  The patient received preoperative Afrin spray for topical decongestion and vasoconstriction.  At an appropriate level, the patient was placed in a semi-sitting position.  Nasal vibrissae were trimmed.   1% Xylocaine with 1:100,000 epinephrine, 6 cc's, was infiltrated into the anterior floor of the nose, into the nasal spine region, into the membranous columella, and finally into the submucoperichondrial plane of the septum on both sides.  Several minutes were allowed for this to take effect.  Cottoniod pledgetts soaked in Afrin were placed into both nasal cavities and left while the patient was prepped and draped in the standard fashion.   A proper time-out was performed.  TThe materials were removed from the nose and observed to be intact and correct in number.    The nose was inspected with a headlight and zero degree endoscope with the findings as described above.  A left Killian incision was sharply executed and carried down to the caudal edge of the quadrangular cartilage with a 15 blade scapel.  A mucoperichondrial flap was elelvated along the quadrangular plate back to the bony-cartilaginous junction using caudal elevator and freer elevator. The mucoperiostium was then elevated along the ethmoid plate and the vomer. An itracartilagenous incision was made using the freer elevator and a contralateral mucoperichondiral flap was elevated using a freer elevator.  Care was taken  to avoid any large rents or opposing rents in the mucoperichondrial flap.  Boney spurs of the vomer and maxillary crest were removed with Takahashi forceps.  The area of cartilagenous deviation was removed with combination of freer elevator and Takahashi forceps creating a widely patent nasal cavity as well as resolution of obstruction from the cartilagenous deviation. The mucosal flaps were placed back into their anatomic position to allow visualization of the airways. The septum now sat in the midline with an improved airway.  A 4-0 Chromic was used to close the Meckling incision as well.   The inferior turbinates were then inspected.  Under endoscopic visualization, the inferior turbinates were infractured bilaterally with a Therapist, nutritional.  A kelly clamp was attached to the anterior-inferior third of each inferior turbinate for approximately one minute.  Under endoscopic visualization, Tru-cutting forceps were used to remove the anterior-inferior third of each inferior turbinate.  Electrocautery was used to control bleeding in the area. The remaining turbinate was then outfractured to open up the airway further. There was no significant bleeding noted. The right turbinate was then trimmed and outfractured in a similar fashion.  The airways were then visualized and showed open passageways on both sides that were significantly improved compared to before surgery.       There was no signifcant bleeding. Nasal splints were applied to both sides of the septum using Xomed 0.65mm regular sized splints that were trimmed, and then held in position with a 3-0 Nylon through and through suture.  Stamberger sinufoam was placed along the cut edge of the inferior turbinates bilaterally.  The patient was turned back over to anesthesia, and  awakened, extubated, and taken to the PACU in satisfactory condition.  Dispo:   PACU to home  Plan: Ice, elevation, narcotic analgesia, and prophylactic antibiotics for the  duration of indwelling nasal foreign bodies.  We will reevaluate the patient in the office in 6 days and remove the septal splints.  Return to work in 10 days, strenuous activities in two weeks.   [name] 04/17/2020 9:57 AM

## 2020-04-17 NOTE — Anesthesia Postprocedure Evaluation (Signed)
Anesthesia Post Note  Patient: Todd [name]  Procedure(s) Performed: NASAL SEPTOPLASTY WITH INFERIORTURBINATE REDUCTION (Bilateral )     Patient location during evaluation: PACU Anesthesia Type: General Level of consciousness: awake and alert Pain management: pain level controlled Vital Signs Assessment: post-procedure vital signs reviewed and stable Respiratory status: spontaneous breathing, nonlabored ventilation, respiratory function stable and patient connected to nasal cannula oxygen Cardiovascular status: blood pressure returned to baseline and stable Postop Assessment: no apparent nausea or vomiting Anesthetic complications: no   No complications documented.  Amr Sturtevant A  Larrisa Cravey

## 2020-04-17 NOTE — Discharge Instructions (Signed)
[facility] ENDOSCOPIC SINUS SURGERY [facility]  What is Functional Endoscopic Sinus Surgery?  The Surgery involves making the natural openings of the sinuses larger by removing the bony partitions that separate the sinuses from the nasal cavity.  The natural sinus lining is preserved as much as possible to allow the sinuses to resume normal function after the surgery.  In some patients nasal polyps (excessively swollen lining of the sinuses) may be removed to relieve obstruction of the sinus openings.  The surgery is performed through the nose using lighted scopes, which eliminates the need for incisions on the face.  A septoplasty is a different procedure which is sometimes performed with sinus surgery.  It involves straightening the boy partition that separates the two sides of your nose.  A crooked or deviated septum may need repair if is obstructing the sinuses or nasal airflow.  Turbinate reduction is also often performed during sinus surgery.  The turbinates are bony proturberances from the side walls of the nose which swell and can obstruct the nose in patients with sinus and allergy problems.  Their size can be surgically reduced to help relieve nasal obstruction.  What Can Sinus Surgery Do For Me?  Sinus surgery can reduce the frequency of sinus infections requiring antibiotic treatment.  This can provide improvement in nasal congestion, post-nasal drainage, facial pressure and nasal obstruction.  Surgery will NOT prevent you from ever having an infection again, so it usually only for patients who get infections 4 or more times yearly requiring antibiotics, or for infections that do not clear with antibiotics.  It will not cure nasal allergies, so patients with allergies may still require medication to treat their allergies after surgery. Surgery may improve headaches related to sinusitis, however, some people will continue to  require medication to control sinus headaches related to allergies.  Surgery will do nothing for other forms of headache (migraine, tension or cluster).  What Are the Risks of Endoscopic Sinus Surgery?  Current techniques allow surgery to be performed safely with little risk, however, there are rare complications that patients should be aware of.  Because the sinuses are located around the eyes, there is risk of eye injury, including blindness, though again, this would be quite rare. This is usually a result of bleeding behind the eye during surgery, which puts the vision oat risk, though there are treatments to protect the vision and prevent permanent disrupted by surgery causing a leak of the spinal fluid that surrounds the brain.  More serious complications would include bleeding inside the brain cavity or damage to the brain.  Again, all of these complications are uncommon, and spinal fluid leaks can be safely managed surgically if they occur.  The most common complication of sinus surgery is bleeding from the nose, which may require packing or cauterization of the nose.  Continued sinus have polyps may experience recurrence of the polyps requiring revision surgery.  Alterations of sense of smell or injury to the tear ducts are also rare complications.   What is the Surgery Like, and what is the Recovery?  The Surgery usually takes a couple of hours to perform, and is usually performed under a general anesthetic (completely asleep).  Patients are usually discharged home after a couple of hours.  Sometimes during surgery it is necessary to pack the nose to control bleeding, and the packing is left in place for 24 - 48 hours, and removed by your surgeon.    If a septoplasty was performed during the procedure, there is often a splint placed which must be removed after 5-7 days.   Discomfort: Pain is usually mild to moderate, and can be controlled by prescription pain medication or acetaminophen (Tylenol).   Aspirin, Ibuprofen (Advil, Motrin), or Naprosyn (Aleve) should be avoided, as they can cause increased bleeding.  Most patients feel sinus pressure like they have a bad head cold for several days.  Sleeping with your head elevated can help reduce swelling and facial pressure, as can ice packs over the face.  A humidifier may be helpful to keep the mucous and blood from drying in the nose.   Diet: There are no specific diet restrictions, however, you should generally start with clear liquids and a light diet of bland foods because the anesthetic can cause some nausea.  Advance your diet depending on how your stomach feels.  Taking your pain medication with food will often help reduce stomach upset which pain medications can cause.  Nasal Saline Irrigation: It is important to remove blood clots and dried mucous from the nose as it is healing.  This is done by having you irrigate the nose at least 3 - 4 times daily with a salt water solution.  We recommend using NeilMed Sinus Rinse (available at the drug store).  Fill the squeeze bottle with the solution, bend over a sink, and insert the tip of the squeeze bottle into the nose  of an inch.  Point the tip of the squeeze bottle towards the inside corner of the eye on the same side your irrigating.  Squeeze the bottle and gently irrigate the nose.  If you bend forward as you do this, most of the fluid will flow back out of the nose, instead of down your throat.   The solution should be warm, near body temperature, when you irrigate.   Each time you irrigate, you should use a full squeeze bottle.   Note that if you are instructed to use Nasal Steroid Sprays at any time after your surgery, irrigate with saline BEFORE using the steroid spray, so you do not wash it all out of the nose. Another product, Nasal Saline Gel (such as AYR Nasal Saline Gel) can be applied in each nostril 3 - 4 times daily to moisture the nose and reduce scabbing or crusting.  Bleeding:   Bloody drainage from the nose can be expected for several days, and patients are instructed to irrigate their nose frequently with salt water to help remove mucous and blood clots.  The drainage may be dark red or brown, though some fresh blood may be seen intermittently, especially after irrigation.  Do not blow you nose, as bleeding may occur. If you must sneeze, keep your mouth open to allow air to escape through your mouth.  If heavy bleeding occurs: Irrigate the nose with saline to rinse out clots, then spray the nose 3 - 4 times with Afrin Nasal Decongestant Spray.  The spray will constrict the blood vessels to slow bleeding.  Pinch the lower half of your nose shut to apply pressure, and lay down with your head elevated.  Ice packs over the nose may help as well. If bleeding persists despite these measures, you should notify your doctor.  Do not use the Afrin routinely to control nasal congestion after surgery, as it can result in worsening congestion and may affect healing.   Activity: Return to work varies among patients. Most patients will be out of   work at least 5 - 7 days to recover.  Patient may return to work after they are off of narcotic pain medication, and feeling well enough to perform the functions of their job.  Patients must avoid heavy lifting (over 10 pounds) or strenuous physical for 2 weeks after surgery, so your employer may need to assign you to light duty, or keep you out of work longer if light duty is not possible.  NOTE: you should not drive, operate dangerous machinery, do any mentally demanding tasks or make any important legal or financial decisions while on narcotic pain medication and recovering from the general anesthetic.    Call Your Doctor Immediately if You Have Any of the Following: 1. Bleeding that you cannot control with the above measures 2. Loss of vision, double vision, bulging of the eye or black eyes. 3. Fever over 101 degrees 4. Neck stiffness with severe  headache, fever, nausea and change in mental state. You are always encourage to call anytime with concerns, however, please call with requests for pain medication refills during office hours.  Office Endoscopy: During follow-up visits your doctor will remove any packing or splints that may have been placed and evaluate and clean your sinuses endoscopically.  Topical anesthetic will be used to make this as comfortable as possible, though you may want to take your pain medication prior to the visit.  How often this will need to be done varies from patient to patient.  After complete recovery from the surgery, you may need follow-up endoscopy from time to time, particularly if there is concern of recurrent infection or nasal polyps.  General Anesthesia, Adult, Care After This sheet gives you information about how to care for yourself after your procedure. Your health care provider may also give you more specific instructions. If you have problems or questions, contact your health care provider. What can I expect after the procedure? After the procedure, the following side effects are common:  Pain or discomfort at the IV site.  Nausea.  Vomiting.  Sore throat.  Trouble concentrating.  Feeling cold or chills.  Weak or tired.  Sleepiness and fatigue.  Soreness and body aches. These side effects can affect parts of the body that were not involved in surgery. Follow these instructions at home:  For at least 24 hours after the procedure:  Have a responsible adult stay with you. It is important to have someone help care for you until you are awake and alert.  Rest as needed.  Do not: ? Participate in activities in which you could fall or become injured. ? Drive. ? Use heavy machinery. ? Drink alcohol. ? Take sleeping pills or medicines that cause drowsiness. ? Make important decisions or sign legal documents. ? Take care of children on your own. Eating and drinking  Follow any  instructions from your health care provider about eating or drinking restrictions.  When you feel hungry, start by eating small amounts of foods that are soft and easy to digest (bland), such as toast. Gradually return to your regular diet.  Drink enough fluid to keep your urine pale yellow.  If you vomit, rehydrate by drinking water, juice, or clear broth. General instructions  If you have sleep apnea, surgery and certain medicines can increase your risk for breathing problems. Follow instructions from your health care provider about wearing your sleep device: ? Anytime you are sleeping, including during daytime naps. ? While taking prescription pain medicines, sleeping medicines, or medicines that make you   drowsy.  Return to your normal activities as told by your health care provider. Ask your health care provider what activities are safe for you.  Take over-the-counter and prescription medicines only as told by your health care provider.  If you smoke, do not smoke without supervision.  Keep all follow-up visits as told by your health care provider. This is important. Contact a health care provider if:  You have nausea or vomiting that does not get better with medicine.  You cannot eat or drink without vomiting.  You have pain that does not get better with medicine.  You are unable to pass urine.  You develop a skin rash.  You have a fever.  You have redness around your IV site that gets worse. Get help right away if:  You have difficulty breathing.  You have chest pain.  You have blood in your urine or stool, or you vomit blood. Summary  After the procedure, it is common to have a sore throat or nausea. It is also common to feel tired.  Have a responsible adult stay with you for the first 24 hours after general anesthesia. It is important to have someone help care for you until you are awake and alert.  When you feel hungry, start by eating small amounts of foods  that are soft and easy to digest (bland), such as toast. Gradually return to your regular diet.  Drink enough fluid to keep your urine pale yellow.  Return to your normal activities as told by your health care provider. Ask your health care provider what activities are safe for you. This information is not intended to replace advice given to you by your health care provider. Make sure you discuss any questions you have with your health care provider. Document Revised: 10/01/2017 Document Reviewed: 05/14/2017 Elsevier Patient Education  2020 [employer]  Scopolamine skin patches Remove in 72 hrs. Wash hands immediately after removal. What is this medicine? SCOPOLAMINE (skoe POL a meen) is used to prevent nausea and vomiting caused by motion sickness, anesthesia and surgery. This medicine may be used for other purposes; ask your health care provider or pharmacist if you have questions. COMMON BRAND NAME(S): Transderm Scop What should I tell my health care provider before I take this medicine? They need to know if you have any of these conditions:  are scheduled to have a gastric secretion test  glaucoma  heart disease  kidney disease  liver disease  lung or breathing disease, like asthma  mental illness  prostate disease  seizures  stomach or intestine problems  trouble passing urine  an unusual or allergic reaction to scopolamine, atropine, other medicines, foods, dyes, or preservatives  pregnant or trying to get pregnant  breast-feeding How should I use this medicine? This medicine is for external use only. Follow the directions on the prescription label. Wear only 1 patch at a time. Choose an area behind the ear, that is clean, dry, hairless and free from any cuts or irritation. Wipe the area with a clean dry tissue. Peel off the plastic backing of the skin patch, trying not to touch the adhesive side with your hands. Do not cut the patches. Firmly apply to the area  you have chosen, with the metallic side of the patch to the skin and the tan-colored side showing. Once firmly in place, wash your hands well with soap and water. Do not get this medicine into your eyes. After removing the patch, wash your hands and the   area behind your ear thoroughly with soap and water. The patch will still contain some medicine after use. To avoid accidental contact or ingestion by children or pets, fold the used patch in half with the sticky side together and throw away in the trash out of the reach of children and pets. If you need to use a second patch after you remove the first, place it behind the other ear. A special MedGuide will be given to you by the pharmacist with each prescription and refill. Be sure to read this information carefully each time. Talk to your pediatrician regarding the use of this medicine in children. Special care may be needed. Overdosage: If you think you have taken too much of this medicine contact a poison control center or emergency room at once. NOTE: This medicine is only for you. Do not share this medicine with others. What if I miss a dose? This does not apply. This medicine is not for regular use. What may interact with this medicine?  alcohol  antihistamines for allergy cough and cold  atropine  certain medicines for anxiety or sleep  certain medicines for bladder problems like oxybutynin, tolterodine  certain medicines for depression like amitriptyline, fluoxetine, sertraline  certain medicines for stomach problems like dicyclomine, hyoscyamine  certain medicines for Parkinson's disease like benztropine, trihexyphenidyl  certain medicines for seizures like phenobarbital, primidone  general anesthetics like halothane, isoflurane, methoxyflurane, propofol  ipratropium  local anesthetics like lidocaine, pramoxine, tetracaine  medicines that relax muscles for surgery  phenothiazines like chlorpromazine, mesoridazine,  prochlorperazine, thioridazine  narcotic medicines for pain  other belladonna alkaloids This list may not describe all possible interactions. Give your health care provider a list of all the medicines, herbs, non-prescription drugs, or dietary supplements you use. Also tell them if you smoke, drink alcohol, or use illegal drugs. Some items may interact with your medicine. What should I watch for while using this medicine? Limit contact with water while swimming and bathing because the patch may fall off. If the patch falls off, throw it away and put a new one behind the other ear. You may get drowsy or dizzy. Do not drive, use machinery, or do anything that needs mental alertness until you know how this medicine affects you. Do not stand or sit up quickly, especially if you are an older patient. This reduces the risk of dizzy or fainting spells. Alcohol may interfere with the effect of this medicine. Avoid alcoholic drinks. Your mouth may get dry. Chewing sugarless gum or sucking hard candy, and drinking plenty of water may help. Contact your healthcare professional if the problem does not go away or is severe. This medicine may cause dry eyes and blurred vision. If you wear contact lenses, you may feel some discomfort. Lubricating drops may help. See your healthcare professional if the problem does not go away or is severe. If you are going to need surgery, an MRI, CT scan, or other procedure, tell your healthcare professional that you are using this medicine. You may need to remove the patch before the procedure. What side effects may I notice from receiving this medicine? Side effects that you should report to your doctor or health care professional as soon as possible:  allergic reactions like skin rash, itching or hives; swelling of the face, lips, or tongue  blurred vision  changes in vision  confusion  dizziness  eye pain  fast, irregular heartbeat  hallucinations, loss of contact  with reality  nausea, vomiting    pain or trouble passing urine  restlessness  seizures  skin irritation  stomach pain Side effects that usually do not require medical attention (report to your doctor or health care professional if they continue or are bothersome):  drowsiness  dry mouth  headache  sore throat This list may not describe all possible side effects. Call your doctor for medical advice about side effects. You may report side effects to FDA at 1-800-FDA-1088. Where should I keep my medicine? Keep out of the reach of children. Store at room temperature between 20 and 25 degrees C (68 and 77 degrees F). Keep this medicine in the foil package until ready to use. Throw away any unused medicine after the expiration date. NOTE: This sheet is a summary. It may not cover all possible information. If you have questions about this medicine, talk to your doctor, pharmacist, or health care provider.  2020 Elsevier/Gold Standard (2017-12-17 16:14:46)   

## 2020-04-17 NOTE — H&P (Signed)
..  History and Physical paper copy reviewed and updated date of procedure and will be scanned into system.  Patient seen and examined.  

## 2020-04-17 NOTE — Transfer of Care (Signed)
Immediate Anesthesia Transfer of Care Note  Patient: Todd [name]  Procedure(s) Performed: NASAL SEPTOPLASTY WITH INFERIORTURBINATE REDUCTION (Bilateral )  Patient Location: PACU  Anesthesia Type: General  Level of Consciousness: awake, alert  and patient cooperative  Airway and Oxygen Therapy: Patient Spontanous Breathing and Patient connected to supplemental oxygen  Post-op Assessment: Post-op Vital signs reviewed, Patient's Cardiovascular Status Stable, Respiratory Function Stable, Patent Airway and No signs of Nausea or vomiting  Post-op Vital Signs: Reviewed and stable  Complications: No complications documented.

## 2020-04-18 ENCOUNTER — Encounter: Payer: Self-pay | Admitting: Otolaryngology

## 2020-05-01 ENCOUNTER — Encounter: Payer: Medicare Other | Attending: Internal Medicine | Admitting: Internal Medicine

## 2020-05-01 ENCOUNTER — Other Ambulatory Visit: Payer: Self-pay

## 2020-05-01 DIAGNOSIS — M5412 Radiculopathy, cervical region: Secondary | ICD-10-CM | POA: Insufficient documentation

## 2020-05-01 DIAGNOSIS — Z8249 Family history of ischemic heart disease and other diseases of the circulatory system: Secondary | ICD-10-CM | POA: Insufficient documentation

## 2020-05-01 DIAGNOSIS — N3010 Interstitial cystitis (chronic) without hematuria: Secondary | ICD-10-CM | POA: Diagnosis not present

## 2020-05-01 DIAGNOSIS — Z841 Family history of disorders of kidney and ureter: Secondary | ICD-10-CM | POA: Diagnosis not present

## 2020-05-01 DIAGNOSIS — E11622 Type 2 diabetes mellitus with other skin ulcer: Secondary | ICD-10-CM | POA: Insufficient documentation

## 2020-05-01 DIAGNOSIS — K219 Gastro-esophageal reflux disease without esophagitis: Secondary | ICD-10-CM | POA: Insufficient documentation

## 2020-05-01 DIAGNOSIS — Z981 Arthrodesis status: Secondary | ICD-10-CM | POA: Diagnosis not present

## 2020-05-01 DIAGNOSIS — L97828 Non-pressure chronic ulcer of other part of left lower leg with other specified severity: Secondary | ICD-10-CM | POA: Diagnosis not present

## 2020-05-01 DIAGNOSIS — Z89512 Acquired absence of left leg below knee: Secondary | ICD-10-CM | POA: Diagnosis not present

## 2020-05-01 DIAGNOSIS — N400 Enlarged prostate without lower urinary tract symptoms: Secondary | ICD-10-CM | POA: Insufficient documentation

## 2020-05-01 DIAGNOSIS — Z87891 Personal history of nicotine dependence: Secondary | ICD-10-CM | POA: Insufficient documentation

## 2020-05-01 DIAGNOSIS — Z888 Allergy status to other drugs, medicaments and biological substances status: Secondary | ICD-10-CM | POA: Diagnosis not present

## 2020-05-01 DIAGNOSIS — Z833 Family history of diabetes mellitus: Secondary | ICD-10-CM | POA: Insufficient documentation

## 2020-05-01 DIAGNOSIS — I10 Essential (primary) hypertension: Secondary | ICD-10-CM | POA: Diagnosis not present

## 2020-05-01 DIAGNOSIS — F4310 Post-traumatic stress disorder, unspecified: Secondary | ICD-10-CM | POA: Diagnosis not present

## 2020-05-01 DIAGNOSIS — E785 Hyperlipidemia, unspecified: Secondary | ICD-10-CM | POA: Insufficient documentation

## 2020-05-01 DIAGNOSIS — Z86718 Personal history of other venous thrombosis and embolism: Secondary | ICD-10-CM | POA: Insufficient documentation

## 2020-05-01 DIAGNOSIS — Z809 Family history of malignant neoplasm, unspecified: Secondary | ICD-10-CM | POA: Diagnosis not present

## 2020-05-01 DIAGNOSIS — E1151 Type 2 diabetes mellitus with diabetic peripheral angiopathy without gangrene: Secondary | ICD-10-CM | POA: Diagnosis not present

## 2020-05-01 DIAGNOSIS — G4733 Obstructive sleep apnea (adult) (pediatric): Secondary | ICD-10-CM | POA: Insufficient documentation

## 2020-05-01 DIAGNOSIS — Z881 Allergy status to other antibiotic agents status: Secondary | ICD-10-CM | POA: Diagnosis not present

## 2020-05-01 NOTE — Progress Notes (Signed)
Todd [name], Todd [name] ([id number]) Visit Report for 05/01/2020 Abuse/Suicide Risk Screen Details Patient Name: Todd [name], Todd [name] Date of Service: 05/01/2020 12:45 PM Medical Record Number: [id number] Patient Account Number: [id number] Date of Birth/Sex: [birth year] (54 y.o. M) Treating RN: Todd [name] Primary Care Todd [name]: Todd [name] Other Clinician: Referring Todd [name]: Referral, Self Treating Todd [name]/Extender: Todd [name] in Treatment: 0 Abuse/Suicide Risk Screen Items Answer ABUSE RISK SCREEN: Has anyone close to you tried to hurt or harm you recentlyo No Do you feel uncomfortable with anyone in your familyo No Has anyone forced you do things that you didnot want to doo No Electronic Signature(s) Signed: 05/01/2020 4:24:58 PM By: Todd [name] Entered By: Todd [name] on 05/01/2020 12:58:07 Todd [name] ([id number]) -------------------------------------------------------------------------------- Activities of Daily Living Details Patient Name: Todd [name] Date of Service: 05/01/2020 12:45 PM Medical Record Number: [id number] Patient Account Number: [id number] Date of Birth/Sex: 09/07/1966 (54 y.o. M) Treating RN: Todd [name] Primary Care Kamiryn Bezanson: Todd [name] Other Clinician: Referring Todd [name]: Referral, Self Treating Todd [name]/Extender: Todd [name] in Treatment: 0 Activities of Daily Living Items Answer Activities of Daily Living (Please select one for each item) Drive Automobile Completely Able Take Medications Completely Able Use Telephone Completely Able Care for Appearance Completely Able Use Toilet Completely Able Bath / Shower Completely Able Dress Self Completely Able Feed Self Completely Able Walk Completely Able Get In / Out Bed Completely Able Housework Completely Able Prepare Meals Completely Able Handle Money Completely Able Shop for Self Completely Able Electronic Signature(s) Signed: 05/01/2020 4:24:58 PM By: Todd [name] Entered By:  Todd [name] on 05/01/2020 12:58:18 Todd [name] ([id number]) -------------------------------------------------------------------------------- Education Screening Details Patient Name: Todd [name] Date of Service: 05/01/2020 12:45 PM Medical Record Number: [id number] Patient Account Number: [id number] Date of Birth/Sex: February 02, 1966 (54 y.o. M) Treating RN: Todd [name] Primary Care Halbert Jesson: Todd [name] Other Clinician: Referring Todd [name]: Referral, Self Treating Todd [name]/Extender: Todd [name] in Treatment: 0 Primary Learner Assessed: Patient Learning Preferences/Education Level/Primary Language Learning Preference: Explanation, Demonstration Highest Education Level: High School Preferred Language: English Cognitive Barrier Language Barrier: No Translator Needed: No Memory Deficit: No Emotional Barrier: No Cultural/Religious Beliefs Affecting Medical Care: No Physical Barrier Impaired Vision: No Impaired Hearing: No Decreased Hand dexterity: No Knowledge/Comprehension Knowledge Level: High Comprehension Level: High Ability to understand written instructions: High Ability to understand verbal instructions: High Motivation Anxiety Level: Calm Cooperation: Cooperative Education Importance: Acknowledges Need Interest in Health Problems: Asks Questions Perception: Coherent Willingness to Engage in Self-Management High Activities: Readiness to Engage in Self-Management High Activities: Electronic Signature(s) Signed: 05/01/2020 4:24:58 PM By: Todd [name] Entered By: Todd [name] on 05/01/2020 12:58:39 Todd [name] ([id number]) -------------------------------------------------------------------------------- Fall Risk Assessment Details Patient Name: Todd [name] Date of Service: 05/01/2020 12:45 PM Medical Record Number: [id number] Patient Account Number: [id number] Date of Birth/Sex: July 31, 1966 (54 y.o. M) Treating RN: Todd [name] Primary Care Todd [name]:  Todd [name] Other Clinician: Referring Todd [name]: Referral, Self Treating Todd [name]/Extender: Todd [name] in Treatment: 0 Fall Risk Assessment Items Have you had 2 or more [name] in the last 12 monthso 0 No Have you had any fall that resulted in injury in the last 12 monthso 0 No [name] RISK SCREEN History of falling - immediate or within 3 months 25 Yes Secondary diagnosis (Do you have 2 or more medical diagnoseso) 0 No Ambulatory aid None/bed rest/wheelchair/nurse 0 No Crutches/cane/walker 15 Yes Furniture 0 No Intravenous therapy Access/Saline/Heparin Lock 0 No Gait/Transferring Normal/ bed rest/ wheelchair 0 No Weak (short steps with or without shuffle, stooped but able to  lift head while walking, may 10 Yes seek support from furniture) Impaired (short steps with shuffle, may have difficulty arising from chair, head down, impaired 0 No balance) Mental Status Oriented to own ability 0 No Electronic Signature(s) Signed: 05/01/2020 4:24:58 PM By: Todd [name] Entered By: Todd [name] on 05/01/2020 12:58:59 Todd [name] ([id number]) -------------------------------------------------------------------------------- Foot Assessment Details Patient Name: Todd [name] Date of Service: 05/01/2020 12:45 PM Medical Record Number: [id number] Patient Account Number: [id number] Date of Birth/Sex: 1966/07/26 (54 y.o. 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 Site Locations + = Sensation present, - = Sensation absent, C = Callus, U = Ulcer R = Redness, W = Warmth, M = Maceration, PU = Pre-ulcerative lesion F = Fissure, S = Swelling, D = Dryness Assessment Right: Left: Other Deformity: No Prior Foot Ulcer: No Prior Amputation: No Charcot Joint:  No Ambulatory Status: Gait: Electronic Signature(s) Signed: 05/01/2020 4:24:58 PM By: 05/03/2020 Entered By: Todd [name] on 05/01/2020 12:59:16 05/03/2020 (Todd [name]) -------------------------------------------------------------------------------- Nutrition Risk Screening Details Patient Name: [id number] Date of Service: 05/01/2020 12:45 PM Medical Record Number: 05/03/2020 Patient Account Number: [id number] Date of Birth/Sex: 22-Nov-1965 (54 y.o. M) Treating RN: 57 Primary Care Genae Strine: Todd [name] Other Clinician: Referring Jaelyn Bourgoin: Referral, Self Treating Muaaz Brau/Extender: Todd [name] in Treatment: 0 Height (in): 68 Weight (lbs): 259 Body Mass Index (BMI): 39.4 Nutrition Risk Screening Items Score Screening NUTRITION RISK SCREEN: I have an illness or condition that made me change the kind and/or amount of food I eat 0 No I eat fewer than two meals per day 0 No I eat few fruits and vegetables, or milk products 0 No I have three or more drinks of beer, liquor or wine almost every day 0 No I have tooth or mouth problems that make it hard for me to eat 0 No I don't always have enough money to buy the food I need 0 No I eat alone most of the time 0 No I take three or more different prescribed or over-the-counter drugs a day 0 No Without wanting to, I have lost or gained 10 pounds in the last six months 0 No I am not always physically able to shop, cook and/or feed myself 0 No Nutrition Protocols Good Risk Protocol 0 No interventions needed Moderate Risk Protocol High Risk Proctocol Risk Level: Good Risk Score: 0 Electronic Signature(s) Signed: 05/01/2020 4:24:58 PM By: 05/03/2020 Entered By: Todd [name] on 05/01/2020 12:59:03

## 2020-05-03 NOTE — Progress Notes (Signed)
[name], Todd ([id number]) Visit Report for 05/01/2020 Chief Complaint Document Details Patient Name: Todd [name], Todd [name] Date of Service: 05/01/2020 12:45 PM Medical Record Number: [id number] Patient Account Number: [id number] Date of Birth/Sex: 1966-04-18 (54 y.o. M) Treating RN: Huel Coventry Primary Care Provider: Magdalene Patricia Other Clinician: Referring Provider: Referral, Self Treating Provider/Extender: Altamese [place] in Treatment: 0 Information Obtained from: Patient Chief Complaint 11/22/2019 patient is here for to painful areas on his left lateral BKA stump 05/01/2020; patient is back for review of the same area on the left lateral BKA stump Electronic Signature(s) Signed: 05/01/2020 4:41:04 PM By: Baltazar Najjar MD Entered By: Baltazar Najjar on 05/01/2020 13:30:07 Todd [name] ([id number]) -------------------------------------------------------------------------------- HPI Details Patient Name: Todd [name] Date of Service: 05/01/2020 12:45 PM Medical Record Number: [id number] Patient Account Number: [id number] Date of Birth/Sex: Aug 10, 1966 (54 y.o. 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 He is also a type II diabetic. He has developed painful calluses on the outer part of his left stump. He says these started in November. They will episodically drain or bleed but he always finds them painful. He [facility] his [facility]. On Friday in 2 days time. He describes activity limiting pain with very little exertion i.e. when he walks from his car to the entrance way of a store like  [employer]. Past medical history includes type 2 diabetes, hypertension, right foot capsulitis, cervical spondylosis, lumbar spinal fusion, hyperlipidemia, interstitial cystitis, BPH, obstructive sleep apnea, gastrointestinal reflux disease, PTSD. Looking through his hospital records he has had DVT rule outs negative x4 since 2017 he has not had any arterial studies 2/17; patient I admitted to the clinic last week with 2 painful areas on his left lateral stump and what is a very proximal BKA site. Since he has been here he has been [facility] they apparently made some modifications to the prosthesis. This is helping somewhat although he still finds the pain difficult somewhat unpredictable. We have arranged arterial studies for him because of the possibility of claudication, the absence of the popliteal artery on the left and coolness to his stump. 3/10; we sent the patient for arterial studies bilaterally and they were quite normal. In particular there were triphasic waveforms at the common femoral and popliteal pulse on the left. No ischemia to the stump he has been using Goldbond cream on the irritated areas on the lateral aspect of his stump. He tells [place] that he has a new prosthesis in the works and is had the mold made for this perhaps this will help. READMISSION 05/01/2020 [name] is a 54 year old diabetic man who we saw previously in this clinic early this year with a painful area on the lateral part of his left BKA stump. I had some concerns when I first saw him about arterial insufficiency we sent him for arterial studies that showed triphasic waveforms in the common femoral artery and popliteal artery. His ABI TBI in the right side were normal i.e. there was no evidence of significant arterial insufficiency. He comes in today with the same painful raised callused area on the lateral part of the tibial plateau. He has a very  proximal below-knee amputation which was initially traumatic when  he was 54 years old. He had several surgeries on this area. He states that when he wears his prosthesis the pain in this area is really difficult for him to bear. He actually came in without his prosthesis using crutches. He has tried using 2% urea cream to soften up the callus, stopped using his prosthesis in the area smoothed over quite nicely but he still has the pain when he attempts to use the prosthesis again. I had suggested to him using thick Allevyn foam's over the area he says they will not stay in place which given the forces on the area and the prosthesis is not surprising. Past medical history includes type 2 diabetes, remote left BKA at age 66 years old traumatic, he had a recent nasal septoplasty on 04/17/2020 interstitial cystitis cervical radiculopathy Electronic Signature(s) Signed: 05/01/2020 4:41:04 PM By: Baltazar Najjar MD Entered By: Baltazar Najjar on 05/01/2020 13:34:15 Todd [name] ([id number]) -------------------------------------------------------------------------------- Physical Exam Details Patient Name: Todd [name] Date of Service: 05/01/2020 12:45 PM Medical Record Number: [id number] Patient Account Number: [id number] Date of Birth/Sex: Dec 18, 1965 (54 y.o. M) Treating RN: Huel Coventry Primary Care Provider: Magdalene Patricia Other Clinician: Referring Provider: Referral, Self Treating Provider/Extender: Altamese [place] in Treatment: 0 Constitutional Sitting or standing Blood Pressure is within target range for patient.. Pulse regular and within target range for patient.Marland Kitchen Respirations regular, non- labored and within target range.. Temperature is normal and within the target range for the patient.Marland Kitchen appears in no distress. Cardiovascular Cannot feel his popliteal pulse on the left. Musculoskeletal The left knee joint itself is unstable collateral ligament damage no doubt I suspect when he goes and puts this down in the prosthesis that there is valgus deformity  at the knee contributing to part of his problem. Integumentary (Hair, Skin) Muscle wasting above the knee on the left. Notes Wound exam; there is actually a callused very painful area on the lateral part of the knee just above the stump margin probably at the lateral tibial plateau. Very tender to touch. There is no open wound per se but probably a false bursa underneath this may be not to is dissimilar from a bunion deformity Electronic Signature(s) Signed: 05/01/2020 4:41:04 PM By: Baltazar Najjar MD Entered By: Baltazar Najjar on 05/01/2020 13:37:25 Todd [name] ([id number]) -------------------------------------------------------------------------------- Physician Orders Details Patient Name: Todd [name] Date of Service: 05/01/2020 12:45 PM Medical Record Number: [id number] Patient Account Number: [id number] Date of Birth/Sex: 02-06-1966 (54 y.o. M) Treating RN: Huel Coventry Primary Care Provider: Magdalene Patricia Other Clinician: Referring Provider: Referral, Self Treating Provider/Extender: Altamese [place] in Treatment: 0 Verbal / Phone Orders: No Diagnosis Coding Discharge From [facility] Services o Discharge from Wound Care Center - Consult Electronic Signature(s) Signed: 05/01/2020 4:41:04 PM By: Baltazar Najjar MD Signed: 05/02/2020 6:25:29 PM By: Elliot Gurney BSN, RN, CWS, Kim RN, BSN Entered By: Elliot Gurney, BSN, RN, CWS, Kim on 05/01/2020 13:26:12 Todd [name] ([id number]) -------------------------------------------------------------------------------- Problem List Details Patient Name: Todd [name] Date of Service: 05/01/2020 12:45 PM Medical Record Number: [id number] Patient Account Number: [id number] Date of Birth/Sex: July 18, 1966 (54 y.o. M) Treating RN: Huel Coventry Primary Care Provider: Magdalene Patricia Other Clinician: Referring Provider: Referral, Self Treating Provider/Extender: Altamese [place] in Treatment: 0 Active Problems ICD-10 Encounter Code Description Active  Date MDM Diagnosis E11.622 Type 2 diabetes mellitus with other skin ulcer 05/01/2020 No Yes L97.828 Non-pressure chronic ulcer of other part of left lower leg with other 05/01/2020 No Yes  specified severity [id number] Acquired absence of left leg below knee 05/01/2020 No Yes Inactive Problems Resolved Problems Electronic Signature(s) Signed: 05/01/2020 4:41:04 PM By: Baltazar Najjar MD Entered By: Baltazar Najjar on 05/01/2020 13:28:09 Todd [name] ([id number]) -------------------------------------------------------------------------------- Progress Note Details Patient Name: Todd [name] Date of Service: 05/01/2020 12:45 PM Medical Record Number: [id number] Patient Account Number: [id number] Date of Birth/Sex: [birth year] (54 y.o. 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 He is also a type II diabetic. He has developed painful calluses on the outer part of his left stump. He says these started in November. They will episodically drain or bleed but he always finds them painful. He has an [facility] to [facility] his [facility]. On Friday in 2 days time. He describes activity limiting pain with very little exertion i.e. when he walks from his car to the entrance way of a store like [employer]. Past medical history includes type 2 diabetes, hypertension, right foot capsulitis, cervical spondylosis, lumbar spinal fusion,  hyperlipidemia, interstitial cystitis, BPH, obstructive sleep apnea, gastrointestinal reflux disease, PTSD. Looking through his hospital records he has had DVT rule outs negative x4 since 2017 he has not had any arterial studies 2/17; patient I admitted to the clinic last week with 2 painful areas on his left lateral stump and what is a very proximal BKA site. Since he has been here he has [facility] they apparently made some modifications to the prosthesis. This is helping somewhat although he still finds the pain difficult somewhat unpredictable. We have arranged arterial studies for him because of the possibility of claudication, the absence of the popliteal artery on the left and coolness to his stump. 3/10; we sent the patient for arterial studies bilaterally and they were quite normal. In particular there were triphasic waveforms at the common femoral and popliteal pulse on the left. No ischemia to the stump he has been using Goldbond cream on the irritated areas on the lateral aspect of his stump. He tells [place] that he has a new prosthesis in the works and is had the mold made for this perhaps this will help. READMISSION 05/01/2020 Mr. Mattix is a 54 year old diabetic man who we saw previously in this clinic early this year with a painful area on the lateral part of his left BKA stump. I had some concerns when I first saw him about arterial insufficiency we sent him for arterial studies that showed triphasic waveforms in the common femoral artery and popliteal artery. His ABI TBI in the right side were normal i.e. there was no evidence of significant arterial insufficiency. He comes in today with the same painful raised callused area on the lateral part of the tibial plateau. He has a very proximal below-knee amputation which was initially traumatic when he was 54 years old. He had several surgeries on this area. He states that when he wears his prosthesis the pain in this area is really  difficult for him to bear. He  actually came in without his prosthesis using crutches. He has tried using 2% urea cream to soften up the callus, stopped using his prosthesis in the area smoothed over quite nicely but he still has the pain when he attempts to use the prosthesis again. I had suggested to him using thick Allevyn foam's over the area he says they will not stay in place which given the forces on the area and the prosthesis is not surprising. Past medical history includes type 2 diabetes, remote left BKA at age 89 years old traumatic, he had a recent nasal septoplasty on 04/17/2020 interstitial cystitis cervical radiculopathy Patient History Information obtained from Patient. Allergies Benadryl, Bactrim, tamsulosin, Myrbetriq Family History Cancer - Mother, Diabetes - Mother, Heart Disease - Mother, Hypertension - Mother, No family history of Hereditary Spherocytosis, Kidney Disease, Lung Disease, Seizures, Stroke, Thyroid Problems, Tuberculosis. Social History Former smoker - 54 years old, Marital Status - Married, Alcohol Use - Never, Drug Use - Prior History, Caffeine Use - Daily. Medical History Respiratory Patient has history of Sleep Apnea - CPAP Denies history of Aspiration, Asthma, Chronic Obstructive Pulmonary Disease (COPD), Pneumothorax, Tuberculosis Cardiovascular Patient has history of Hypertension Denies history of Angina, Arrhythmia, Congestive Heart Failure, Coronary Artery Disease, Deep Vein Thrombosis, Hypotension, Myocardial Eddleman, Kofi ([id number]) Infarction, Peripheral Arterial Disease, Peripheral Venous Disease, Phlebitis, Vasculitis Gastrointestinal Denies history of Cirrhosis , Colitis, Crohn s, Hepatitis A, Hepatitis B, Hepatitis C Endocrine Patient has history of Type II Diabetes Denies history of Type I Diabetes Integumentary (Skin) Patient has history of History of pressure wounds Denies history of History of Burn Musculoskeletal Denies history of  Gout, Rheumatoid Arthritis, Osteoarthritis, Osteomyelitis Patient is treated with Oral Agents. Blood sugar is not tested. Medical And Surgical History Notes Gastrointestinal diverticulitis Musculoskeletal L BKA 1973 Review of Systems (ROS) Eyes Denies complaints or symptoms of Dry Eyes, Vision Changes, Glasses / Contacts. Ear/Nose/Mouth/Throat Denies complaints or symptoms of Difficult clearing ears, Sinusitis. Hematologic/Lymphatic Denies complaints or symptoms of Bleeding / Clotting Disorders, Human Immunodeficiency Virus. Respiratory Denies complaints or symptoms of Chronic or frequent coughs, Shortness of Breath. Cardiovascular Denies complaints or symptoms of Chest pain, LE edema. Gastrointestinal Denies complaints or symptoms of Frequent diarrhea, Nausea, Vomiting. Endocrine Denies complaints or symptoms of Hepatitis, Thyroid disease, Polydypsia (Excessive Thirst). Genitourinary Denies complaints or symptoms of Kidney failure/ Dialysis, Incontinence/dribbling. Immunological Denies complaints or symptoms of Hives, Itching. Integumentary (Skin) Complains or has symptoms of Wounds. Denies complaints or symptoms of Bleeding or bruising tendency, Breakdown, Swelling. Musculoskeletal Denies complaints or symptoms of Muscle Pain, Muscle Weakness. Neurologic Denies complaints or symptoms of Numbness/parasthesias, Focal/Weakness. Psychiatric Denies complaints or symptoms of Anxiety, Claustrophobia. Objective Constitutional Sitting or standing Blood Pressure is within target range for patient.. Pulse regular and within target range for patient.Marland Kitchen Respirations regular, non- labored and within target range.. Temperature is normal and within the target range for the patient.Marland Kitchen appears in no distress. Vitals Time Taken: 1:45 PM, Height: 68 in, Weight: 259 lbs, Source: Stated, BMI: 39.4, Temperature: 98.4 F, Pulse: 96 bpm, Respiratory Rate: 16 breaths/min, Blood Pressure: 130/77  mmHg. Cardiovascular Cannot feel his popliteal pulse on the left. Musculoskeletal The left knee joint itself is unstable collateral ligament damage no doubt I suspect when he goes and puts this down in the prosthesis that there is valgus deformity at the knee contributing to part of his problem. General Notes: Wound exam; there is actually a callused very painful area on the lateral part of the knee just above the stump margin  probably at the lateral tibial plateau. Very tender to touch. There is no open wound per se but probably a false bursa underneath this may be not to is dissimilar from a bunion deformity Bensinger, Jedediah ([id number]) Integumentary (Hair, Skin) Muscle wasting above the knee on the left. Wound #3 status is Open. Original cause of wound was Pressure Injury. The wound is located on the Left,Lateral Knee. The wound measures 0.2cm length x 0.1cm width x 0.1cm depth; 0.016cm^2 area and 0.002cm^3 volume. There is Fat Layer (Subcutaneous Tissue) Exposed exposed. There is no tunneling or undermining noted. There is a medium amount of serosanguineous drainage noted. The wound margin is flat and intact. There is medium (34-66%) red granulation within the wound bed. There is a medium (34-66%) amount of necrotic tissue within the wound bed including Adherent Slough. 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 2. I think his problem is largely caused by valgus deformity at the knee in stance phase. He also states that he has 2 rotate the leg in order to clear the foot he has on his  prosthesis. This was apparently helped by a computerized foot that automatically dorsi flexes when he goes to lift the leg but apparently that is very expensive. Nevertheless it has been put through his insurance. 3. The only thing I could think of to do here was to try to brace the knee with an athletic supporter to hold a border foam in place under his stump sock. Perhaps this would give him some relief from the friction. 4. We also talked about proceeding with an above-knee amputation in a different type of prosthesis although there is a lot of muscle atrophy above the knee I wonder whether this would actually work. His hip extensors however are strong 5. I do not think we actually need to follow him here I had some suggestions I shared him. He will try to go forward with these I spent 30 minutes in review of this patient's past medical record face-to-face evaluation and preparation of this note Electronic Signature(s) Signed: 05/01/2020 4:41:04 PM By: Baltazar Najjarobson, Marthella Osorno MD Entered By: Baltazar Najjarobson, Maanasa Aderhold on 05/01/2020 13:41:55 Todd ManningIVERA, Arnol ([id number]) -------------------------------------------------------------------------------- ROS/PFSH Details Patient Name: Todd ManningIVERA, Keiron Date of Service: 05/01/2020 12:45 PM Medical Record Number: [id number] Patient Account Number: [id number] Date of Birth/Sex: 05/04/1966 (54 y.o. M) Treating RN: Rodell PernaScott, Dajea Primary Care Provider: Magdalene PatriciaEVELO, ADRIAN Other Clinician: Referring Provider: Referral, Self Treating Provider/Extender: Altamese CarolinaOBSON, Naisha Wisdom G We[place] in Treatment: 0 Information Obtained From Patient Eyes Complaints and Symptoms: Negative for: Dry Eyes; Vision Changes; Glasses / Contacts Ear/Nose/Mouth/Throat Complaints and Symptoms: Negative for: Difficult clearing ears; Sinusitis Hematologic/Lymphatic Complaints and Symptoms: Negative for: Bleeding / Clotting Disorders; Human Immunodeficiency Virus Respiratory Complaints and Symptoms: Negative for:  Chronic or frequent coughs; Shortness of Breath Medical History: Positive for: Sleep Apnea - CPAP Negative for: Aspiration; Asthma; Chronic Obstructive Pulmonary Disease (COPD); Pneumothorax; Tuberculosis Cardiovascular Complaints and Symptoms: Negative for: Chest pain; LE edema Medical History: Positive for: Hypertension Negative for: Angina; Arrhythmia; Congestive Heart Failure; Coronary Artery Disease; Deep Vein Thrombosis; Hypotension; Myocardial Infarction; Peripheral Arterial Disease; Peripheral Venous  Disease; Phlebitis; Vasculitis Gastrointestinal Complaints and Symptoms: Negative for: Frequent diarrhea; Nausea; Vomiting Medical History: Negative for: Cirrhosis ; Colitis; Crohnos; Hepatitis A; Hepatitis B; Hepatitis C Past Medical History Notes: diverticulitis Endocrine Complaints and Symptoms: Negative for: Hepatitis; Thyroid disease; Polydypsia (Excessive Thirst) Medical History: Positive for: Type II Diabetes Negative for: Type I Diabetes Treated with: Oral agents Blood sugar tested every day: No Genitourinary Rapley, Austan ([id number]) Complaints and Symptoms: Negative for: Kidney failure/ Dialysis; Incontinence/dribbling Immunological Complaints and Symptoms: Negative for: Hives; Itching Integumentary (Skin) Complaints and Symptoms: Positive for: Wounds Negative for: Bleeding or bruising tendency; Breakdown; Swelling Medical History: Positive for: History of pressure wounds Negative for: History of Burn Musculoskeletal Complaints and Symptoms: Negative for: Muscle Pain; Muscle Weakness Medical History: Negative for: Gout; Rheumatoid Arthritis; Osteoarthritis; Osteomyelitis Past Medical History Notes: L BKA 1973 Neurologic Complaints and Symptoms: Negative for: Numbness/parasthesias; Focal/Weakness Psychiatric Complaints and Symptoms: Negative for: Anxiety; Claustrophobia Oncologic Immunizations Pneumococcal Vaccine: Received Pneumococcal  Vaccination: No Implantable Devices None Family and Social History Cancer: Yes - Mother; Diabetes: Yes - Mother; Heart Disease: Yes - Mother; Hereditary Spherocytosis: No; Hypertension: Yes - Mother; Kidney Disease: No; Lung Disease: No; Seizures: No; Stroke: No; Thyroid Problems: No; Tuberculosis: No; Former smoker - 54 years old; Marital Status - Married; Alcohol Use: Never; Drug Use: Prior History; Caffeine Use: Daily; Financial Concerns: No; Food, Clothing or Shelter Needs: No; Support System Lacking: No; Transportation Concerns: No Electronic Signature(s) Signed: 05/01/2020 4:24:58 PM By: Rodell Perna Signed: 05/01/2020 4:41:04 PM By: Baltazar Najjar MD Entered By: Rodell Perna on 05/01/2020 12:57:58 DIYAN, DAVE ([id number]) -------------------------------------------------------------------------------- SuperBill Details Patient Name: Todd [name] Date of Service: 05/01/2020 Medical Record Number: [id number] Patient Account Number: [id number] Date of Birth/Sex: January 20, 1966 (54 y.o. M) Treating RN: Huel Coventry Primary Care Provider: Magdalene Patricia Other Clinician: Referring Provider: Referral, Self Treating Provider/Extender: Altamese [place] in Treatment: 0 Diagnosis Coding ICD-10 Codes Code Description E11.622 Type 2 diabetes mellitus with other skin ulcer L97.828 Non-pressure chronic ulcer of other part of left lower leg with other specified severity Z89.512 Acquired absence of left leg below knee Facility Procedures CPT4 Code: [id number] Description: [phone] - WOUND CARE VISIT-LEV 2 EST PT Modifier: Quantity: 1 Physician Procedures CPT4 Code: [id number] Description: 99214 - WC PHYS LEVEL 4 - EST PT Modifier: Quantity: 1 CPT4 Code: Description: ICD-10 Diagnosis Description E11.622 Type 2 diabetes mellitus with other skin ulcer L97.828 Non-pressure chronic ulcer of other part of left lower leg with other spe Z89.512 Acquired absence of left leg below knee Modifier: cified  severity Quantity: Electronic Signature(s) Signed: 05/01/2020 4:41:04 PM By: Baltazar Najjar MD Entered By: Baltazar Najjar on 05/01/2020 13:42:19

## 2020-05-03 NOTE — Progress Notes (Addendum)
ARLIND, KLINGERMAN ([id number]) Visit Report for 05/01/2020 Allergy List Details Patient Name: Todd [name], Todd [name] Date of Service: 05/01/2020 12:45 PM Medical Record Number: [id number] Patient Account Number: [id number] Date of Birth/Sex: Feb 23, 1966 (54 y.o. M) Treating RN: Rodell Perna Primary Care Chelbi Herber: Magdalene Patricia Other Clinician: Referring Kacelyn Rowzee: Referral, Self Treating Ezzie Senat/Extender: Maxwell Caul Weeks in Treatment: 0 Allergies Active Allergies Benadryl Bactrim tamsulosin Myrbetriq Allergy Notes Electronic Signature(s) Signed: 05/01/2020 4:24:58 PM By: Rodell Perna Entered By: Rodell Perna on 05/01/2020 12:57:09 [name] ([id number]) -------------------------------------------------------------------------------- Arrival Information Details Patient Name: [name] Date of Service: 05/01/2020 12:45 PM Medical Record Number: [id number] Patient Account Number: [id number] Date of Birth/Sex: December 25, 1965 (54 y.o. M) Treating RN: Huel Coventry Primary Care Mathhew Buysse: Magdalene Patricia Other Clinician: Referring Merinda Victorino: Referral, Self Treating Angalena Cousineau/Extender: Altamese [place] in Treatment: 0 Visit Information Patient Arrived: Crutches Arrival Time: 12:50 Accompanied By: self Transfer Assistance: None Patient Identification Verified: Yes Secondary Verification Process Completed: Yes History Since Last Visit Added or deleted any medications: No Any new allergies or adverse reactions: No Had a fall or experienced change in activities of daily living that may affect risk of falls: No Signs or symptoms of abuse/neglect since last visito No Hospitalized since last visit: No Implantable device outside of the clinic excluding cellular tissue based products placed in the center since last visit: No Electronic Signature(s) Signed: 05/01/2020 4:10:06 PM By: Dayton Martes RCP, RRT, CHT Entered By: Dayton Martes on 05/01/2020 12:52:23 [name] ([id number]) -------------------------------------------------------------------------------- Clinic Level of Care Assessment Details Patient Name: [name] Date of Service: 05/01/2020 12:45 PM Medical Record Number: [id number] Patient Account Number: [id number] Date of Birth/Sex: Dec 14, 1965 (54 y.o. M) Treating RN: Huel Coventry Primary Care Okey Zelek: Magdalene Patricia Other Clinician: Referring Arn Mcomber: Referral, Self Treating Finnegan Gatta/Extender: Altamese [place] in Treatment: 0 Clinic Level of Care Assessment Items TOOL 2 Quantity Score []  - Use when only an EandM is performed on the INITIAL visit 0 ASSESSMENTS - Nursing Assessment / Reassessment X - General Physical Exam (combine w/ comprehensive assessment (listed just below) when performed on new 1 20 pt. evals) X- 1 25 Comprehensive Assessment (HX, ROS, Risk Assessments, Wounds Hx, etc.) ASSESSMENTS - Wound and Skin Assessment / Reassessment X - Simple Wound Assessment / Reassessment - one wound 1 5 []  - 0 Complex Wound Assessment / Reassessment - multiple wounds []  - 0 Dermatologic / Skin Assessment (not related to wound area) ASSESSMENTS - Ostomy and/or Continence Assessment and Care []  - Incontinence Assessment and Management 0 []  - 0 Ostomy Care Assessment and Management (repouching, etc.) PROCESS - Coordination of Care X - Simple Patient / Family Education for ongoing care 1 15 []  - 0 Complex (extensive) Patient / Family Education for ongoing care []  - 0 Staff obtains , Records, Test Results / Process Orders []  - 0 Staff telephones HHA, Nursing Homes / Clarify orders / etc []  - 0 Routine Transfer to another Facility (non-emergent condition) []  - 0 Routine Hospital Admission (non-emergent condition) []  - 0 New Admissions / / Ordering NPWT, Apligraf, etc. []  - 0 Emergency Hospital Admission (emergent condition) X- 1 10 Simple Discharge Coordination []  - 0 Complex  (extensive) Discharge Coordination PROCESS - Special Needs []  - Pediatric / Minor Patient Management 0 []  - 0 Isolation Patient Management []  - 0 Hearing / Language / Visual special needs []  - 0 Assessment of Community assistance (transportation, D/C planning, etc.) []  - 0 Additional assistance / Altered mentation []  - 0 Support  Surface(s) Assessment (bed, cushion, seat, etc.) INTERVENTIONS - Wound Cleansing / Measurement []  - Wound Imaging (photographs - any number of wounds) 0 []  - 0 Wound Tracing (instead of photographs) []  - 0 Simple Wound Measurement - one wound []  - 0 Complex Wound Measurement - multiple wounds Gohr, [place] ( ) []  - 0 Simple Wound Cleansing - one wound []  - 0 Complex Wound Cleansing - multiple wounds INTERVENTIONS - Wound Dressings []  - Small Wound Dressing one or multiple wounds 0 []  - 0 Medium Wound Dressing one or multiple wounds []  - 0 Large Wound Dressing one or multiple wounds []  - 0 Application of Medications - injection INTERVENTIONS - Miscellaneous []  - External ear exam 0 []  - 0 Specimen Collection (cultures, biopsies, blood, body fluids, etc.) []  - 0 Specimen(s) / Culture(s) sent or taken to Lab for analysis []  - 0 Patient Transfer (multiple staff / / Similar devices) []  - 0 Simple Staple / Suture removal (25 or less) []  - 0 Complex Staple / Suture removal (26 or more) []  - 0 Hypo / Hyperglycemic Management (close monitor of Blood Glucose) []  - 0 Ankle / Brachial Index (ABI) - do not check if billed separately Has the patient been seen at the hospital within the last three years: Yes Total Score: 75 Level Of Care: New/Established - Level 2 Electronic Signature(s) Signed: 05/02/2020 6:25:29 PM By: , BSN, RN, CWS, Kim RN, BSN Entered By: [id number], BSN, RN, CWS, Kim on 05/01/2020 13:27:15 ( ) -------------------------------------------------------------------------------- Encounter  Discharge Information Details Patient Name: Date of Service: 05/01/2020 12:45 PM Medical Record Number: Patient Account Number: Date of Birth/Sex: [birth year] (54 y.o. M) Treating RN: Primary Care Alasha Mcguinness: Nurse, adult Other Clinician: Referring Leanne Sisler: Referral, Self Treating Lisset Ketchem/Extender: in Treatment: 0 Encounter Discharge Information Items Discharge Condition: Stable Ambulatory Status: Crutches Discharge Destination: Home Transportation: Private Auto Accompanied By: self Schedule Follow-up Appointment: Yes Clinical Summary of Care: Electronic Signature(s) Signed: 05/02/2020 6:25:29 PM By: , BSN, RN, CWS, Kim RN, BSN Entered By: , BSN, RN, CWS, Kim on 05/01/2020 13:27:55 Elliot Gurney (Elliot Gurney) -------------------------------------------------------------------------------- Lower Extremity Assessment Details Patient Name: 05/03/2020 Date of Service: 05/01/2020 12:45 PM Medical Record Number: [id number] Patient Account Number: [name] Date of Birth/Sex: 06/12/1966 (54 y.o. M) Treating RN: [id number] Primary Care Hakeem Frazzini: 01/19/1966 Other Clinician: Referring Kyjuan Gause: Referral, Self Treating Jevonte Clanton/Extender: 01-15-1994 in Treatment: 0 Electronic Signature(s) Signed: 05/01/2020 4:24:58 PM By: Magdalene Patricia Entered By: Altamese Largo on 05/01/2020 13:02:38 Elliot Gurney (Elliot Gurney) -------------------------------------------------------------------------------- Multi Wound Chart Details Patient Name: 05/03/2020 Date of Service: 05/01/2020 12:45 PM Medical Record Number: [id number] Patient Account Number: [name] Date of Birth/Sex: 1966/06/14 (54 y.o. M) Treating RN: [id number] Primary Care Saesha Llerenas: 01/19/1966 Other Clinician: Referring Maycen Degregory: Referral, Self Treating Veola Cafaro/Extender: 01-15-1994 in Treatment: 0 Vital Signs Height(in):  68 Pulse(bpm): 96 Weight(lbs): 259 Blood Pressure(mmHg): 130/77 Body Mass Index(BMI): 39 Temperature(F): 98.4 Respiratory Rate(breaths/min): 16 Photos: [N/A:N/A] Wound Location: Left, Lateral Knee N/A N/A Wounding Event: Pressure Injury N/A N/A Primary Etiology: Diabetic Wound/Ulcer of the Lower N/A N/A Extremity Comorbid History: Sleep Apnea, Hypertension, Type II N/A N/A Diabetes, History of pressure wounds Date Acquired: 04/30/2020 N/A N/A Weeks of Treatment: 0 N/A N/A Wound Status: Open N/A N/A Measurements L x W x D (cm) 0.2x0.1x0.1 N/A N/A Area (cm) : 0.016 N/A N/A Volume (cm) : 0.002 N/A N/A Classification: Grade 2 N/A N/A Exudate Amount: Medium  N/A N/A Exudate Type: Serosanguineous N/A N/A Exudate Color: red, brown N/A N/A Wound Margin: Flat and Intact N/A N/A Granulation Amount: Medium (34-66%) N/A N/A Granulation Quality: Red N/A N/A Necrotic Amount: Medium (34-66%) N/A N/A Exposed Structures: Fat Layer (Subcutaneous Tissue) N/A N/A Exposed: Yes Fascia: No Tendon: No Muscle: No Joint: No Bone: No Epithelialization: None N/A N/A Treatment Notes Electronic Signature(s) Signed: 05/01/2020 4:41:04 PM By: Baltazar Najjarobson, Michael MD Entered By: Baltazar Najjarobson, Michael on 05/01/2020 13:29:29 Chari ManningIVERA, Gwyn ([id number]) -------------------------------------------------------------------------------- Multi-Disciplinary Care Plan Details Patient Name: Chari ManningIVERA, Lathen Date of Service: 05/01/2020 12:45 PM Medical Record Number: [id number] Patient Account Number: [id number] Date of Birth/Sex: 10/20/1965 (54 y.o. M) Treating RN: Huel CoventryWoody, Kim Primary Care Avianna Moynahan: Magdalene PatriciaEVELO, ADRIAN Other Clinician: Referring Milaya Hora: Referral, Self Treating Alison Kubicki/Extender: Altamese CarolinaOBSON, MICHAEL G Weeks in Treatment: 0 Active Inactive Electronic Signature(s) Signed: 05/14/2020 1:02:57 PM By: Elliot GurneyWoody, BSN, RN, CWS, Kim RN, BSN Previous Signature: 05/02/2020 6:25:29 PM Version By: Elliot GurneyWoody, BSN, RN, CWS, Kim RN,  BSN Entered By: Elliot GurneyWoody, BSN, RN, CWS, Kim on 05/14/2020 13:02:56 Chari ManningIVERA, Kardell ([id number]) -------------------------------------------------------------------------------- Pain Assessment Details Patient Name: Chari ManningIVERA, Montford Date of Service: 05/01/2020 12:45 PM Medical Record Number: [id number] Patient Account Number: [id number] Date of Birth/Sex: 04/28/1966 (54 y.o. M) Treating RN: Huel CoventryWoody, Kim Primary Care Antolin Belsito: Magdalene PatriciaEVELO, ADRIAN Other Clinician: Referring Micheline Markes: Referral, Self Treating Antonios Ostrow/Extender: Altamese CarolinaOBSON, MICHAEL G Weeks in Treatment: 0 Active Problems Location of Pain Severity and Description of Pain Patient Has Paino Yes Site Locations Rate the pain. Current Pain Level: 1 Pain Management and Medication Current Pain Management: Electronic Signature(s) Signed: 05/01/2020 4:10:06 PM By: Dayton MartesWallace, RCP,RRT,CHT, Sallie RCP, RRT, CHT Signed: 05/02/2020 6:25:29 PM By: Elliot GurneyWoody, BSN, RN, CWS, Kim RN, BSN Entered By: Dayton MartesWallace, RCP,RRT,CHT, Sallie on 05/01/2020 12:52:40 Chari ManningIVERA, Albi ([id number]) -------------------------------------------------------------------------------- Patient/Caregiver Education Details Patient Name: Chari ManningIVERA, Woodie Date of Service: 05/01/2020 12:45 PM Medical Record Number: [id number] Patient Account Number: [id number] Date of Birth/Gender: 06/19/1966 (54 y.o. M) Treating RN: Huel CoventryWoody, Kim Primary Care Physician: Magdalene PatriciaEVELO, ADRIAN Other Clinician: Referring Physician: Referral, Self Treating Physician/Extender: Altamese CarolinaOBSON, MICHAEL G Weeks in Treatment: 0 Education Assessment Education Provided To: Patient Education Topics Provided Wound/Skin Impairment: Psychologist, prison and probation serviceslectronic Signature(s) Signed: 05/02/2020 6:25:29 PM By: Elliot GurneyWoody, BSN, RN, CWS, Kim RN, BSN Entered By: Elliot GurneyWoody, BSN, RN, CWS, Kim on 05/01/2020 13:27:35 Chari ManningIVERA, Zannie ([id number]) -------------------------------------------------------------------------------- Wound Assessment Details Patient Name: Chari ManningIVERA, Fausto Date of  Service: 05/01/2020 12:45 PM Medical Record Number: [id number] Patient Account Number: [id number] Date of Birth/Sex: 11/20/1965 (54 y.o. M) Treating RN: Rodell PernaScott, Dajea Primary Care Deshia Vanderhoof: Magdalene PatriciaEVELO, ADRIAN Other Clinician: Referring Vilas Edgerly: Referral, Self Treating Dennise Bamber/Extender: Altamese CarolinaOBSON, MICHAEL G Weeks in Treatment: 0 Wound Status Wound Number: 3 Primary Diabetic Wound/Ulcer of the Lower Extremity Etiology: Wound Location: Left, Lateral Knee Wound Status: Open Wounding Event: Pressure Injury Comorbid Sleep Apnea, Hypertension, Type II Diabetes, History of Date Acquired: 04/30/2020 History: pressure wounds Weeks Of Treatment: 0 Clustered Wound: No Photos Wound Measurements Length: (cm) 0.2 Width: (cm) 0.1 Depth: (cm) 0.1 Area: (cm) 0.016 Volume: (cm) 0.002 % Reduction in Area: % Reduction in Volume: Epithelialization: None Tunneling: No Undermining: No Wound Description Classification: Grade 2 Wound Margin: Flat and Intact Exudate Amount: Medium Exudate Type: Serosanguineous Exudate Color: red, brown Foul Odor After Cleansing: No Slough/Fibrino Yes Wound Bed Granulation Amount: Medium (34-66%) Exposed Structure Granulation Quality: Red Fascia Exposed: No Necrotic Amount: Medium (34-66%) Fat Layer (Subcutaneous Tissue) Exposed: Yes Necrotic Quality: Adherent Slough Tendon Exposed: No Muscle Exposed: No Joint Exposed: No Bone Exposed: No Electronic Signature(s) Signed: 05/01/2020 4:24:58 PM By: Rodell PernaScott, Dajea  Entered By: Rodell Perna on 05/01/2020 13:02:24 [name] ([id number]) -------------------------------------------------------------------------------- Vitals Details Patient Name: [name] Date of Service: 05/01/2020 12:45 PM Medical Record Number: [id number] Patient Account Number: [id number] Date of Birth/Sex: 11/10/1965 (54 y.o. M) Treating RN: Huel Coventry Primary Care Burnett Lieber: Magdalene Patricia Other Clinician: Referring Jynesis Nakamura: Referral,  Self Treating Oliver Heitzenrater/Extender: Altamese [place] in Treatment: 0 Vital Signs Time Taken: 13:45 Temperature (F): 98.4 Height (in): 68 Pulse (bpm): 96 Weight (lbs): 259 Respiratory Rate (breaths/min): 16 Source: Stated Blood Pressure (mmHg): 130/77 Body Mass Index (BMI): 39.4 Reference Range: 80 - 120 mg / dl Electronic Signature(s) Signed: 05/01/2020 4:10:06 PM By: Dayton Martes RCP, RRT, CHT Entered By: Dayton Martes on 05/01/2020 12:53:28

## 2020-05-08 NOTE — Telephone Encounter (Signed)
Patient was seen in the office on 02/22/20 by Michiel Cowboy, after his return from [place].

## 2020-05-14 ENCOUNTER — Other Ambulatory Visit: Payer: Self-pay

## 2020-05-14 ENCOUNTER — Ambulatory Visit: Payer: Medicare Other | Attending: Physical Medicine and Rehabilitation | Admitting: Physical Therapy

## 2020-05-14 ENCOUNTER — Encounter: Payer: Self-pay | Admitting: Physical Therapy

## 2020-05-14 DIAGNOSIS — G8929 Other chronic pain: Secondary | ICD-10-CM | POA: Insufficient documentation

## 2020-05-14 DIAGNOSIS — M25512 Pain in left shoulder: Secondary | ICD-10-CM | POA: Insufficient documentation

## 2020-05-14 DIAGNOSIS — M25511 Pain in right shoulder: Secondary | ICD-10-CM | POA: Diagnosis present

## 2020-05-14 NOTE — Therapy (Signed)
[facility] [address] [place], [address] Phone: [phone]   Fax:  [phone]  Physical Therapy Treatment  Patient Details  Name: Todd [name] MRN: [id number] Date of Birth: 06-18-1966 No data recorded  Encounter Date: 05/14/2020   PT End of Session - 05/14/20 1759    Visit Number 1    Number of Visits 17    Date for PT Re-Evaluation 07/09/20    PT Start Time 0900    PT Stop Time 0948    PT Time Calculation (min) 48 min    Activity Tolerance Patient tolerated treatment well    Behavior During Therapy [facility] for tasks assessed/performed           Past Medical History:  Diagnosis Date  . Anxiety   . 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 Chronic left shoulder pain 02/15/2016  . Depression   . Diabetes mellitus without complication (HCC)   . Dislocation of right thumb 06/05/2016  . Diverticulitis   . 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 Gastroparesis 06/08/2017  . GERD (gastroesophageal reflux disease)   . Headache    daily.  Migraines 2-3x/yr.  Marland Kitchen HTN (hypertension) 05/19/2016  . Hypertension   . MDD (major depressive disorder), recurrent episode (HCC) 05/28/2016  . Neck pain, bilateral 02/15/2016  . Primary osteoarthritis of right knee 01/31/2016   and fingers  . PTSD (post-traumatic stress disorder) 06/05/2016  . Severe recurrent major depression without psychotic features (HCC) 05/19/2016  . Sleep apnea    unable to tolerate CPAP  . Substance induced mood disorder (HCC) 05/19/2016  . Suicidal ideation 05/19/2016  . Type 2 diabetes mellitus with complication, without long-term current use of insulin (HCC) 06/08/2017  . Uncontrolled type 2 diabetes mellitus with hyperglycemia, without long-term current use of insulin (HCC) 03/12/2017  . Vertigo     Past Surgical History:  Procedure Laterality Date  . BACK SURGERY  11/209/18 and 10/08/17   x 2  . COLONOSCOPY WITH PROPOFOL N/A  01/31/2018   Procedure: COLONOSCOPY WITH PROPOFOL;  Surgeon: Scot Jun, MD;  Location: [facility] ENDOSCOPY;  Service: Endoscopy;  Laterality: N/A;  . ESOPHAGOGASTRODUODENOSCOPY (EGD) WITH PROPOFOL N/A 01/31/2018   Procedure: ESOPHAGOGASTRODUODENOSCOPY (EGD) WITH PROPOFOL;  Surgeon: Scot Jun, MD;  Location: [facility] ENDOSCOPY;  Service: Endoscopy;  Laterality: N/A;  . LEG AMPUTATION BELOW KNEE     left  . NASAL SEPTOPLASTY W/ TURBINOPLASTY Bilateral 04/17/2020   Procedure: NASAL SEPTOPLASTY WITH INFERIORTURBINATE REDUCTION;  Surgeon: [name], MD;  Location: [facility];  Service: ENT;  Laterality: Bilateral;  Diabetic - oral meds  . SHOULDER SURGERY Left     There were no vitals filed for this visit.   Subjective Assessment - 05/14/20 0908    Pertinent History Pt is a 54y/o male presenting with R sided neck and shoulder pain >1year. Pain from R side of the neck radiates into R shoulder (not past forearm), and feels achy like a "ripped muscle". Patient reports his pain is exacerbated by turning his head to the R (no pain with L rotation) and with behing head ER, but reports pain is always present. He has full shoulder AROM but reports his overhead motion feels "stiff". Pt is on disability, but works on [employer] as a hobby. He was working out (light [employer]) 5x/week prior to COVID closure, and has not been back to the gym because he does not  want to wear a mask in the gym. Worst pain in the past week: 8/10 best: 6/10. He is R hand dominent. Pt denies N/V, B&B changes, unexplained weight fluctuation, saddle paresthesia, fever, night sweats, or unrelenting night pain at this time. 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    Limitations Lifting;House hold activities    How long can you sit comfortably? unlimited    How long can you stand comfortably? unlimited    How long can you walk comfortably? unlimited    Diagnostic tests Pt reports  imaging done in [facility] by Dr. Jaynie Collins that revealed a "pinched nerve"    Patient Stated Goals loosen something up    Currently in Pain? Yes    Pain Score 6     Pain Location Shoulder    Pain Orientation Right    Pain Descriptors / Indicators Aching;Throbbing    Pain Type Chronic pain    Pain Radiating Towards R side of neck and shoulder    Pain Onset More than a month ago    Pain Frequency Constant    Aggravating Factors  turning head to R, reaching behind head in ER    Pain Relieving Factors Haven't tried anything    Effect of Pain on Daily Activities Driving              OBJECTIVE  MUSCULOSKELETAL: Tremor: Normal Bulk: Normal Tone: Normal  Cervical Screen AROM:  R/L 60/75 Rotation 24/44 Lateral bending WNL Ext and flex with "pulling sensation" with flex  Spurlings A (ipsilateral lateral flexion/axial compression): R: Positive L: Negative Spurlings B (ipsilateral lateral flexion/contralateral rotation/axial compression): R: Positive} L: Negative  Repeated movement: No centralization or peripheralization with protraction or retraction Hoffman Sign (cervical cord compression): Negative bilat  ULTT Median: Negative bilat ULTT Ulnar: Negative bilat ULTT Radial: Negative bilat  Elbow Screen Elbow AROM:WNL bilat  Palpation TTP at bicipital groove of bilat shoulders L>R; trigger points with mild TTP at bilat UT L.R; tension and latent trigger points at bilat pec minor without pain Posture: FHRS  Strength R/L 4+/5 Shoulder flexion (anterior deltoid/pec major/coracobrachialis, axillary n. (C5-6) and musculocutaneous n. (C5-7)) 5/5* Shoulder abduction (deltoid/supraspinatus, axillary/suprascapular n, C5) 4+/4+* Shoulder external rotation (infraspinatus/teres minor) 5/5 Shoulder internal rotation (subcapularis/lats/pec major) 5/5 Shoulder extension (posterior deltoid, lats, teres major, axillary/thoracodorsal n.) 5/5 Shoulder horizontal abduction 5/5 Elbow flexion  (biceps brachii, brachialis, brachioradialis, musculoskeletal n, C5-6) 5/5 Elbow extension (triceps, radial n, C7) 5/5 Wrist Extension 5/5 Wrist Flexion 5/5 Finger adduction (interossei, ulnar n, T1) 3+/3+ Y lower trap 4-/4- T Scapular retractors 4+/4+ I position  5/5 Latissimus  AROM R/L 180/180* Shoulder flexion 180/180 Shoulder abduction C8*/C8* Shoulder external rotation T10* concordant sign/T10 Shoulder internal rotation 60/60 Shoulder extension *Indicates pain, overpressure performed unless otherwise indicated  PROM WNL for cervical and shoulder  Accessory Motions/Glides Glenohumeral: All WNL with some exessive gaurding on LUE  Acromioclavicular:  WNL bilat ant/post  Sternoclavicular: Posterior: normal bilat Anterior: normal bilat Superior: normal bilat Inferior: normal bilat NEUROLOGICAL:  Mental Status Patient is oriented to person, place and time.  Recent memory is intact.  Remote memory is intact.  Attention span and concentration are intact.  Expressive speech is intact.  Patient's fund of knowledge is within normal limits for educational level.  Sensation Grossly intact to light touch bilateral UE as determined by testing dermatomes C2-T2 Proprioception and hot/cold testing deferred on this date  Reflexes R/L 2+/2+ Biceps 2+/2+ Brachioradialis 2+/2+ Triceps  SPECIAL TESTS  Rotator Cuff  Drop Arm Test: Negative bilat Painful Arc (Pain from 60 to 120 degrees scaption): Negative bilat Infraspinatus: positive on LUE  Subacromial Impingement Hawkins-Kennedy: Positive bilat Neer (Block scapula, PROM flexion): positive on LUE Painful Arc (Pain from 60 to 120 degrees scaption): Negative bilat Empty Can: Positive bilat R>L External Rotation Resistance: Positive bilat R>L Horizontal Adduction: Negative bilat Scapular Assist: Negative bilat  Labral Tear Biceps Load II (120 elevation, full ER, 90 elbow flexion, full supination, resisted elbow  flexion): Negative bilat Crank (160 scaption, axial load with IR/ER): Negative bilat Active Compression Test: Negative bilat  Bicep Tendon Pathology Speed (shoulder flexion to 90, external rotation, full elbow extension, and forearm supination with resistance: positive bilat at "distal bicep" Yergason's (resisted shoulder ER and supination/biceps tendon pathology): R: positive L: negative   Shoulder Instability Sulcus Sign: Negative bilat Anterior Apprehension: Negative bilat  Ther-Ex PT reviewed the following HEP with patient with patient able to demonstrate a set of the following with min cuing for correction needed. PT educated patient on parameters of therex (how/when to inc/decrease intensity, frequency, rep/set range, stretch hold time, and purpose of therex) with verbalized understanding. Education on length tension relationship needed for postural stability with good understanding UT stretch x30sec hold pec doorway stretch x30sec hold                                      PT Education - 05/14/20 0919    Education Details Patient was educated on diagnosis, anatomy and pathology involved, prognosis, role of PT, and was given an HEP, demonstrating exercise with proper form following verbal and tactile cues, and was given a paper hand out to continue exercise at home. Pt was educated on and agreed to plan of care.    Methods Explanation;Demonstration;Verbal cues    Comprehension Verbalized understanding;Returned demonstration;Verbal cues required            PT Short Term Goals - 05/15/20 0733      PT SHORT TERM GOAL #1   Title Pt will be independent with HEP in order to improve strength and decrease pain in order to improve pain-free function at home and work.    Baseline 05/14/20 HEP given    Time 4    Period Weeks    Status New             PT Long Term Goals - 05/15/20 0734      PT LONG TERM GOAL #1   Title Patient will increase FOTO  score to 61 to demonstrate predicted increase in functional mobility to complete ADLs    Baseline 05/14/20 43    Time 8    Period Weeks    Status New      PT LONG TERM GOAL #2   Title Pt will decrease worst pain as reported on NPRS by at least 3 points in order to demonstrate clinically significant reduction in pain.    Baseline 05/14/20 8/10 R shoulder pain with overhead motion/lifting    Time 8    Period Weeks    Status New      PT LONG TERM GOAL #3   Title Pt will increase periscapular strength of  by at least 1/2 MMT grade in order to demonstrate improvement in strength and function    Baseline 05/14/20 R/L lower trap (Y): 3+ bilat; Scapular retractors (T) 4- bilat  Time 8    Period Weeks    Status New                 Plan - 05/14/20 1759    Clinical Impression Statement Pt is a 54 year old male presenting with acute on chronic episode of bilat shoulder pain L>R with insideous onset. Signs and symptoms of subacromial impingement impairments d/t postural dysfunction. Examination reveals deficits in bilat shoulder and periscapular strength, active ROM overhead and behind head, postural dysfunction, and pain. Activity limitations in reaching overhead, turning head to R, lifting/carrying; inhibiting participation in driving, and completing household ADLs    Personal Factors and Comorbidities Comorbidity 1;Comorbidity 2;Comorbidity 3+;Behavior Pattern;Fitness;Age;Time since onset of injury/illness/exacerbation;Past/Current Experience    Comorbidities GERD, HTN, DM2, anxiety, depression, PTSD    Examination-Activity Limitations Lift;Reach Overhead;Carry    Examination-Participation Restrictions Driving;Community Activity;Cleaning    Stability/Clinical Decision Making Evolving/Moderate complexity    Clinical Decision Making Moderate    Rehab Potential Good    PT Frequency 2x / week    PT Duration 8 weeks    PT Treatment/Interventions Electrical Stimulation;Therapeutic  activities;Patient/family education;Taping;Spinal Manipulations;Vestibular;Passive range of motion;Joint Manipulations;Dry needling;Manual techniques;Functional mobility training;Ultrasound;Cryotherapy;Traction;Stair training;Neuromuscular re-education;Therapeutic exercise;DME Instruction;Iontophoresis 4mg /ml Dexamethasone;Moist Heat    PT Next Visit Plan persicapular strengthening,postural restoration    PT Home Exercise Plan doorway pec and UT stretch    Consulted and Agree with Plan of Care Patient           Patient will benefit from skilled therapeutic intervention in order to improve the following deficits and impairments:  Improper body mechanics, Pain, Postural dysfunction, Decreased mobility, Decreased endurance, Decreased activity tolerance, Decreased range of motion, Decreased strength, Impaired flexibility, Impaired UE functional use  Visit Diagnosis: Chronic right shoulder pain - Plan: PT plan of care cert/re-cert  Chronic left shoulder pain - Plan: PT plan of care cert/re-cert     Problem List Patient Active Problem List   Diagnosis Date Noted  . Obesity (BMI 35.0-39.9 without comorbidity) 12/14/2019  . Metatarsalgia of right foot 10/30/2019  . Acquired absence of extremity 10/30/2019  . Closed fracture of distal end of radius 05/31/2018  . Radiculopathy of lumbosacral region 09/30/2017  . Spondylolisthesis at L5-S1 level 09/30/2017  . Morbid obesity (HCC) 09/06/2017  . Hepatic steatosis 08/26/2017  . Non-intractable vomiting with nausea 08/26/2017  . Upper abdominal pain 08/26/2017  . 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 Gastroparesis 06/08/2017  . History of noncompliance with medical treatment 06/08/2017  . Diabetes mellitus type 2 in obese (HCC) 06/08/2017  . Uncontrolled type 2 diabetes mellitus with hyperglycemia (HCC) 03/12/2017  . 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 Dislocation of right thumb 06/05/2016  . Anxiety disorder 06/05/2016  . MDD (major  depressive disorder), recurrent episode (HCC) 05/28/2016  . Substance induced mood disorder (HCC) 05/19/2016  . Severe recurrent major depression without psychotic features (HCC) 05/19/2016  . Suicidal ideation 05/19/2016  . Essential (primary) hypertension 05/19/2016  . GERD (gastroesophageal reflux disease) 05/19/2016  . Alcohol use disorder, mild, abuse 05/19/2016  . Chronic left shoulder pain 02/15/2016  . Neck pain, bilateral 02/15/2016  . Primary osteoarthritis of right knee 01/31/2016   02/02/2016 DPT Hilda Lias 05/15/2020, 7:41 AM  [facility] PHYSICAL AND SPORTS MEDICINE [address], [place] Phone: [phone]   Fax:  [phone]  Name: Todd [name] MRN: [name] Date of Birth: July 10, 1966

## 2020-05-17 ENCOUNTER — Ambulatory Visit: Payer: Medicare Other | Admitting: Physical Therapy

## 2020-05-21 ENCOUNTER — Ambulatory Visit: Payer: Medicare Other | Admitting: Physical Therapy

## 2020-05-27 ENCOUNTER — Ambulatory Visit: Payer: Medicare Other | Admitting: Physical Therapy

## 2020-05-29 ENCOUNTER — Ambulatory Visit: Payer: Medicare Other | Admitting: Physical Therapy

## 2020-06-03 ENCOUNTER — Ambulatory Visit: Payer: Medicare Other | Admitting: Physical Therapy

## 2020-06-06 ENCOUNTER — Encounter: Payer: Medicare Other | Admitting: Physical Therapy

## 2020-06-09 ENCOUNTER — Emergency Department
Admission: EM | Admit: 2020-06-09 | Discharge: 2020-06-10 | Disposition: A | Discharge status: Home / Self Care | Payer: Medicare Other | Attending: Emergency Medicine | Admitting: Emergency Medicine

## 2020-06-09 ENCOUNTER — Other Ambulatory Visit: Payer: Self-pay

## 2020-06-09 DIAGNOSIS — K5900 Constipation, unspecified: Secondary | ICD-10-CM | POA: Diagnosis not present

## 2020-06-09 DIAGNOSIS — Z79899 Other long term (current) drug therapy: Secondary | ICD-10-CM | POA: Insufficient documentation

## 2020-06-09 DIAGNOSIS — Z87891 Personal history of nicotine dependence: Secondary | ICD-10-CM | POA: Insufficient documentation

## 2020-06-09 DIAGNOSIS — E119 Type 2 diabetes mellitus without complications: Secondary | ICD-10-CM | POA: Diagnosis not present

## 2020-06-09 DIAGNOSIS — I10 Essential (primary) hypertension: Secondary | ICD-10-CM | POA: Diagnosis not present

## 2020-06-09 DIAGNOSIS — R1030 Lower abdominal pain, unspecified: Secondary | ICD-10-CM

## 2020-06-09 DIAGNOSIS — K5730 Diverticulosis of large intestine without perforation or abscess without bleeding: Secondary | ICD-10-CM | POA: Insufficient documentation

## 2020-06-09 DIAGNOSIS — R1032 Left lower quadrant pain: Secondary | ICD-10-CM | POA: Diagnosis present

## 2020-06-09 LAB — COMPREHENSIVE METABOLIC PANEL
ALT: 39 U/L (ref 0–44)
AST: 28 U/L (ref 15–41)
Albumin: 4.5 g/dL (ref 3.5–5.0)
Alkaline Phosphatase: 60 U/L (ref 38–126)
Anion gap: 11 (ref 5–15)
BUN: 15 mg/dL (ref 6–20)
CO2: 23 mmol/L (ref 22–32)
Calcium: 8.9 mg/dL (ref 8.9–10.3)
Chloride: 107 mmol/L (ref 98–111)
Creatinine, Ser: 1.01 mg/dL (ref 0.61–1.24)
GFR calc Af Amer: >60 mL/min (ref >60)
GFR calc non Af Amer: >60 mL/min (ref >60)
Glucose, Bld: 154 mg/dL — ABNORMAL HIGH (ref 70–99)
Potassium: 3.6 mmol/L (ref 3.5–5.1)
Sodium: 141 mmol/L (ref 135–145)
Total Bilirubin: 0.9 mg/dL (ref 0.3–1.2)
Total Protein: 7.4 g/dL (ref 6.5–8.1)

## 2020-06-09 LAB — CBC
HCT: 46.3 % (ref 39.0–52.0)
Hemoglobin: 15.8 g/dL (ref 13.0–17.0)
MCH: 27.7 pg (ref 26.0–34.0)
MCHC: 34.1 g/dL (ref 30.0–36.0)
MCV: 81.1 fL (ref 80.0–100.0)
Platelets: 224 10*3/uL (ref 150–400)
RBC: 5.71 MIL/uL (ref 4.22–5.81)
RDW: 13 % (ref 11.5–15.5)
WBC: 6.9 10*3/uL (ref 4.0–10.5)
nRBC: 0 % (ref 0.0–0.2)

## 2020-06-09 LAB — LIPASE, BLOOD: Lipase: 35 U/L (ref 11–51)

## 2020-06-09 MED ORDER — ONDANSETRON HCL 4 MG/2ML IJ SOLN
4.0000 mg | Freq: Once | INTRAMUSCULAR | Status: AC
  Administered 2020-06-10: 4 mg via INTRAVENOUS
  Filled 2020-06-09: qty 2

## 2020-06-09 MED ORDER — MORPHINE SULFATE (PF) 4 MG/ML IV SOLN
4.0000 mg | Freq: Once | INTRAVENOUS | Status: AC
  Administered 2020-06-10: 4 mg via INTRAVENOUS
  Filled 2020-06-09: qty 1

## 2020-06-09 MED ORDER — SODIUM CHLORIDE 0.9 % IV BOLUS
1000.0000 mL | Freq: Once | INTRAVENOUS | Status: AC
  Administered 2020-06-10: 1000 mL via INTRAVENOUS

## 2020-06-09 MED ORDER — IOHEXOL 9 MG/ML PO SOLN
500.0000 mL | Freq: Two times a day (BID) | ORAL | Status: DC | PRN
  Administered 2020-06-10 (×2): 500 mL via ORAL

## 2020-06-09 NOTE — ED Triage Notes (Signed)
Pt states he had abdominal pain 2 weeks ago and then it went away until yesterday when it started again- pt states about an hour ago he starts his rectum and intestines felt like they are on fire- pt has had diverticulitis before and had problems with his intestines

## 2020-06-09 NOTE — ED Provider Notes (Signed)
[facility] Emergency Department Provider Note   ____________________________________________   First MD Initiated Contact with Patient 06/09/20 2340     (approximate)  I have reviewed the triage vital signs and the nursing notes.   HISTORY  Chief Complaint Abdominal Pain    HPI Todd [name] is a 54 y.o. male who presents to the ED from home with a chief complaint of abdominal pain.  Patient has a history of diverticulitis.  States he had similar abdominal pain 2 weeks ago which resolved without intervention.  This morning he began to have lower abdominal pain and rectal pain.  Had a small loose stool and nausea.  Now having pain in his left lower quadrants.  Denies fever, cough, chest pain, shortness of breath, vomiting.  Denies recent travel, camping or trauma.  Took 2 rounds of antibiotics over 1 month ago for sinus infection.      Past Medical History:  Diagnosis Date  . Anxiety   . 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 Chronic left shoulder pain 02/15/2016  . Depression   . Diabetes mellitus without complication (HCC)   . Dislocation of right thumb 06/05/2016  . Diverticulitis   . 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 Gastroparesis 06/08/2017  . GERD (gastroesophageal reflux disease)   . Headache    daily.  Migraines 2-3x/yr.  Marland Kitchen. HTN (hypertension) 05/19/2016  . Hypertension   . MDD (major depressive disorder), recurrent episode (HCC) 05/28/2016  . Neck pain, bilateral 02/15/2016  . Primary osteoarthritis of right knee 01/31/2016   and fingers  . PTSD (post-traumatic stress disorder) 06/05/2016  . Severe recurrent major depression without psychotic features (HCC) 05/19/2016  . Sleep apnea    unable to tolerate CPAP  . Substance induced mood disorder (HCC) 05/19/2016  . Suicidal ideation 05/19/2016  . Type 2 diabetes mellitus with complication, without long-term current use of insulin (HCC) 06/08/2017  . Uncontrolled type 2  diabetes mellitus with hyperglycemia, without long-term current use of insulin (HCC) 03/12/2017  . Vertigo     Patient Active Problem List   Diagnosis Date Noted  . Obesity (BMI 35.0-39.9 without comorbidity) 12/14/2019  . Metatarsalgia of right foot 10/30/2019  . Acquired absence of extremity 10/30/2019  . Closed fracture of distal end of radius 05/31/2018  . Radiculopathy of lumbosacral region 09/30/2017  . Spondylolisthesis at L5-S1 level 09/30/2017  . Morbid obesity (HCC) 09/06/2017  . Hepatic steatosis 08/26/2017  . Non-intractable vomiting with nausea 08/26/2017  . Upper abdominal pain 08/26/2017  . 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 Gastroparesis 06/08/2017  . History of noncompliance with medical treatment 06/08/2017  . Diabetes mellitus type 2 in obese (HCC) 06/08/2017  . Uncontrolled type 2 diabetes mellitus with hyperglycemia (HCC) 03/12/2017  . 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 Dislocation of right thumb 06/05/2016  . Anxiety disorder 06/05/2016  . MDD (major depressive disorder), recurrent episode (HCC) 05/28/2016  . Substance induced mood disorder (HCC) 05/19/2016  . Severe recurrent major depression without psychotic features (HCC) 05/19/2016  . Suicidal ideation 05/19/2016  . Essential (primary) hypertension 05/19/2016  . GERD (gastroesophageal reflux disease) 05/19/2016  . Alcohol use disorder, mild, abuse 05/19/2016  . Chronic left shoulder pain 02/15/2016  . Neck pain, bilateral 02/15/2016  . Primary osteoarthritis of right knee 01/31/2016    Past Surgical History:  Procedure Laterality Date  . BACK SURGERY  11/209/18 and 10/08/17   x 2  . COLONOSCOPY WITH PROPOFOL  N/A 01/31/2018   Procedure: COLONOSCOPY WITH PROPOFOL;  Surgeon: Scot Jun, MD;  Location: [facility] ENDOSCOPY;  Service: Endoscopy;  Laterality: N/A;  . ESOPHAGOGASTRODUODENOSCOPY (EGD) WITH PROPOFOL N/A 01/31/2018   Procedure: ESOPHAGOGASTRODUODENOSCOPY (EGD) WITH PROPOFOL;  Surgeon:  Scot Jun, MD;  Location: [facility] ENDOSCOPY;  Service: Endoscopy;  Laterality: N/A;  . LEG AMPUTATION BELOW KNEE     left  . NASAL SEPTOPLASTY W/ TURBINOPLASTY Bilateral 04/17/2020   Procedure: NASAL SEPTOPLASTY WITH INFERIORTURBINATE REDUCTION;  Surgeon: [name], MD;  Location: [facility];  Service: ENT;  Laterality: Bilateral;  Diabetic - oral meds  . SHOULDER SURGERY Left     Prior to Admission medications   Medication Sig Start Date End Date Taking? Authorizing Provider  amoxicillin-clavulanate (AUGMENTIN) 875-125 MG tablet Take 1 tablet by mouth 2 (two) times daily. 04/17/20   Vaught, Roney Mans, MD  atorvastatin (LIPITOR) 40 MG tablet Take 40 mg by mouth at bedtime.  08/10/19   [provider]  cloNIDine (CATAPRES) 0.2 MG tablet Take 0.2 mg by mouth at bedtime.  04/26/17   [provider]  FARXIGA 10 MG TABS tablet Take 10 mg by mouth daily. 10/23/19   [provider]  glipiZIDE (GLUCOTROL XL) 10 MG 24 hr tablet Take 10 mg by mouth daily. 02/16/20   [provider]  hydrochlorothiazide (HYDRODIURIL) 12.5 MG tablet Take 12.5 mg by mouth daily.    [provider]  lisinopril (PRINIVIL,ZESTRIL) 40 MG tablet Take 1 tablet (40 mg total) by mouth daily. 05/20/16   Pucilowska, Ellin Goodie, MD  meclizine (ANTIVERT) 25 MG tablet Take 1 tablet (25 mg total) by mouth 3 (three) times daily as needed for dizziness. 03/16/20   Phineas Semen, MD  omeprazole (PRILOSEC) 40 MG capsule Take 40 mg by mouth 2 (two) times daily. 11/30/19   [provider]  ondansetron (ZOFRAN) 4 MG tablet Take 1 tablet (4 mg total) by mouth every 8 (eight) hours as needed for up to 10 doses for nausea or vomiting. 04/17/20   Vaught, Roney Mans, MD  oxybutynin (DITROPAN-XL) 10 MG 24 hr tablet Take 10 mg by mouth daily. 12/20/19   [provider]  sitaGLIPtin (JANUVIA) 100 MG tablet TAKE 1 TABLET(100 MG) BY MOUTH EVERY DAY 10/20/19   [provider]     Allergies Bactrim [sulfamethoxazole-trimethoprim], Myrbetriq [mirabegron], Benadryl [diphenhydramine], Diphenhydramine hcl, and Tamsulosin  Family History  Problem Relation Age of Onset  . Diabetes Mother     Social History Social History   Tobacco Use  . Smoking status: Former Smoker    Packs/day: 1.00    Years: 11.00    Pack years: 11.00    Types: Cigarettes    Quit date: 10/12/1988    Years since quitting: 31.6  . Smokeless tobacco: Never Used  Vaping Use  . Vaping Use: Never used  Substance Use Topics  . Alcohol use: No  . Drug use: No    Review of Systems  Constitutional: No fever/chills Eyes: No visual changes. ENT: No sore throat. Cardiovascular: Denies chest pain. Respiratory: Denies shortness of breath. Gastrointestinal: Positive for abdominal pain and nausea, no vomiting.  No diarrhea.  No constipation. Genitourinary: Negative for dysuria. Musculoskeletal: Negative for back pain. Skin: Negative for rash. Neurological: Negative for headaches, focal weakness or numbness.   ____________________________________________   PHYSICAL EXAM:  VITAL SIGNS: ED Triage Vitals  Enc Vitals Group     BP 06/09/20 1420 139/80     Pulse Rate 06/09/20 1420 85  Resp 06/09/20 1420 18     Temp 06/09/20 1420 98.5 F (36.9 C)     Temp Source 06/09/20 1420 Oral     SpO2 06/09/20 1420 97 %     Weight 06/09/20 1418 260 lb (117.9 kg)     Height 06/09/20 1418 5\' 8"  (1.727 m)     Head Circumference --      Peak Flow --      Pain Score 06/09/20 1417 10     Pain Loc --      Pain Edu? --      Excl. in GC? --     Constitutional: Alert and oriented. Well appearing and in no acute distress. Eyes: Conjunctivae are normal. PERRL. EOMI. Head: Atraumatic. Nose: No congestion/rhinnorhea. Mouth/Throat: Mucous membranes are moist.  Oropharynx non-erythematous. Neck: No stridor.   Cardiovascular: Normal rate, regular rhythm. Grossly normal heart sounds.  Good peripheral  circulation. Respiratory: Normal respiratory effort.  No retractions. Lungs CTAB. Gastrointestinal: Soft and mildly tender to palpation suprapubic and left lower quadrant without rebound or guarding. No distention. No abdominal bruits. No CVA tenderness. Musculoskeletal: Right BKA.  No lower extremity tenderness nor edema.  No joint effusions. Neurologic:  Normal speech and language. No gross focal neurologic deficits are appreciated. No gait instability. Skin:  Skin is warm, dry and intact. No rash noted. Psychiatric: Mood and affect are normal. Speech and behavior are normal.  ____________________________________________   LABS (all labs ordered are listed, but only abnormal results are displayed)  Labs Reviewed  COMPREHENSIVE METABOLIC PANEL - Abnormal; Notable for the following components:      Result Value   Glucose, Bld 154 (*)    All other components within normal limits  URINALYSIS, COMPLETE (UACMP) WITH MICROSCOPIC - Abnormal; Notable for the following components:   Color, Urine STRAW (*)    APPearance CLEAR (*)    Glucose, UA >=500 (*)    Ketones, ur 5 (*)    All other components within normal limits  LIPASE, BLOOD  CBC   ____________________________________________  EKG  None ____________________________________________  RADIOLOGY  ED MD interpretation: Diverticulosis without acute diverticulitis  Official radiology report(s): CT Abdomen Pelvis W Contrast  Result Date: 06/10/2020 CLINICAL DATA:  Recurrent abdominal pain EXAM: CT ABDOMEN AND PELVIS WITH CONTRAST TECHNIQUE: Multidetector CT imaging of the abdomen and pelvis was performed using the standard protocol following bolus administration of intravenous contrast. CONTRAST:  06/12/2020 OMNIPAQUE IOHEXOL 300 MG/ML  SOLN COMPARISON:  03/22/2019 FINDINGS: Lower chest: No acute pleural or parenchymal lung disease. Hepatobiliary: Diffuse decreased attenuation of the liver parenchyma without focal abnormality. No biliary  dilation. The gallbladder is unremarkable. Pancreas: Unremarkable. No pancreatic ductal dilatation or surrounding inflammatory changes. Spleen: Normal in size without focal abnormality. Adrenals/Urinary Tract: Stable punctate less than 2 mm nonobstructing left renal calculus, reference image 58/2. 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 Stable right renal cysts. The adrenals are unremarkable. Bladder is grossly normal. Stomach/Bowel: No bowel obstruction or ileus. Normal appendix right lower quadrant. Scattered diverticulosis of the sigmoid colon without evidence of acute diverticulitis. Vascular/Lymphatic: Aortic atherosclerosis. No enlarged abdominal or pelvic lymph nodes. Reproductive: Prostate is unremarkable. Other: No free fluid or free gas.  No abdominal wall hernia. Musculoskeletal: No acute or destructive bony lesions. Stable postsurgical changes at the lumbosacral junction. Reconstructed images demonstrate no additional findings. IMPRESSION: 1. Stable punctate less than 2 mm nonobstructing left renal calculus. 2. Sigmoid diverticulosis without acute diverticulitis. 3. Stable fatty liver. 4. Aortic Atherosclerosis (ICD10-I70.0). Electronically  Signed   By: Sharlet Salina M.D.   On: 06/10/2020 02:15    ____________________________________________   PROCEDURES  Procedure(s) performed (including Critical Care):  Procedures   ____________________________________________   INITIAL IMPRESSION / ASSESSMENT AND PLAN / ED COURSE  As part of my medical decision making, I reviewed the following data within the electronic MEDICAL RECORD NUMBER Nursing notes reviewed and incorporated, Labs reviewed, Old chart reviewed, Radiograph reviewed, Notes from prior ED visits and [place] Controlled Substance Database     Iker Nuttall was evaluated in Emergency Department on 06/10/2020 for the symptoms described in the history of present illness. He was evaluated in the context of the global COVID-19 pandemic,  which necessitated consideration that the patient might be at risk for infection with the SARS-CoV-2 virus that causes COVID-19. Institutional protocols and algorithms that pertain to the evaluation of patients at risk for COVID-19 are in a state of rapid change based on information released by regulatory bodies including the CDC and federal and state organizations. These policies and algorithms were followed during the patient's care in the ED.    54 year old male with history of diverticulitis presenting with lower abdominal pain. Differential diagnosis includes, but is not limited to, acute appendicitis, renal colic, testicular torsion, urinary tract infection/pyelonephritis, prostatitis, epididymitis, diverticulitis, small bowel obstruction or ileus, colitis, abdominal aortic aneurysm, gastroenteritis, hernia, etc.  Laboratory results unremarkable.  Awaiting UA.  Initiate IV hydration, IV morphine for pain, IV Zofran for nausea.  Will obtain CT abdomen/pelvis to evaluate for diverticulitis.   Clinical Course as of Jun 10 326  Mon Jun 10, 2020  9518 Patient resting more comfortably.  Updated him on CT scan results.  Reviewed scout film which demonstrates moderate stool burden.  Awaiting urine specimen.   [JS]  0326 UA negative for infection.  Will discharge patient home on Lactulose and Bentyl to use as needed.  Strict return precautions given.  Patient verbalizes understanding agrees with plan of care.   [JS]    Clinical Course User Index [JS] Irean Hong, MD     ____________________________________________   FINAL CLINICAL IMPRESSION(S) / ED DIAGNOSES  Final diagnoses:  Lower abdominal pain  Constipation, unspecified constipation type     ED Discharge Orders    None       Note:  This document was prepared using Dragon voice recognition software and may include unintentional dictation errors.   Irean Hong, MD 06/10/20 [phone]

## 2020-06-10 ENCOUNTER — Other Ambulatory Visit: Payer: Self-pay

## 2020-06-10 ENCOUNTER — Encounter: Payer: Self-pay | Admitting: Emergency Medicine

## 2020-06-10 ENCOUNTER — Emergency Department: Payer: Medicare Other

## 2020-06-10 ENCOUNTER — Encounter: Payer: Medicare Other | Admitting: Physical Therapy

## 2020-06-10 ENCOUNTER — Emergency Department
Admission: EM | Admit: 2020-06-10 | Discharge: 2020-06-10 | Disposition: A | Discharge status: Home / Self Care | Payer: Medicare Other | Source: Home / Self Care

## 2020-06-10 ENCOUNTER — Encounter: Payer: Self-pay | Admitting: Radiology

## 2020-06-10 DIAGNOSIS — K5730 Diverticulosis of large intestine without perforation or abscess without bleeding: Secondary | ICD-10-CM | POA: Diagnosis not present

## 2020-06-10 DIAGNOSIS — K6289 Other specified diseases of anus and rectum: Secondary | ICD-10-CM | POA: Insufficient documentation

## 2020-06-10 DIAGNOSIS — Z5321 Procedure and treatment not carried out due to patient leaving prior to being seen by health care provider: Secondary | ICD-10-CM | POA: Insufficient documentation

## 2020-06-10 DIAGNOSIS — R1030 Lower abdominal pain, unspecified: Secondary | ICD-10-CM | POA: Insufficient documentation

## 2020-06-10 LAB — COMPREHENSIVE METABOLIC PANEL
ALT: 46 U/L — ABNORMAL HIGH (ref 0–44)
AST: 35 U/L (ref 15–41)
Albumin: 5.1 g/dL — ABNORMAL HIGH (ref 3.5–5.0)
Alkaline Phosphatase: 59 U/L (ref 38–126)
Anion gap: 12 (ref 5–15)
BUN: 13 mg/dL (ref 6–20)
CO2: 24 mmol/L (ref 22–32)
Calcium: 9.2 mg/dL (ref 8.9–10.3)
Chloride: 104 mmol/L (ref 98–111)
Creatinine, Ser: 0.98 mg/dL (ref 0.61–1.24)
GFR calc Af Amer: >60 mL/min (ref >60)
GFR calc non Af Amer: >60 mL/min (ref >60)
Glucose, Bld: 146 mg/dL — ABNORMAL HIGH (ref 70–99)
Potassium: 3.7 mmol/L (ref 3.5–5.1)
Sodium: 140 mmol/L (ref 135–145)
Total Bilirubin: 1.3 mg/dL — ABNORMAL HIGH (ref 0.3–1.2)
Total Protein: 8.1 g/dL (ref 6.5–8.1)

## 2020-06-10 LAB — URINALYSIS, COMPLETE (UACMP) WITH MICROSCOPIC
Bacteria, UA: NONE SEEN
Bilirubin Urine: NEGATIVE
Glucose, UA: >=500 mg/dL — AB
Hgb urine dipstick: NEGATIVE
Ketones, ur: 5 mg/dL — AB
Leukocytes,Ua: NEGATIVE
Nitrite: NEGATIVE
Protein, ur: NEGATIVE mg/dL
Specific Gravity, Urine: 1.008 (ref 1.005–1.030)
Squamous Epithelial / HPF: NONE SEEN (ref 0–5)
pH: 6 (ref 5.0–8.0)

## 2020-06-10 LAB — TYPE AND SCREEN
ABO/RH(D): O POS
Antibody Screen: NEGATIVE

## 2020-06-10 LAB — CBC
HCT: 49 % (ref 39.0–52.0)
Hemoglobin: 16.7 g/dL (ref 13.0–17.0)
MCH: 27.6 pg (ref 26.0–34.0)
MCHC: 34.1 g/dL (ref 30.0–36.0)
MCV: 80.9 fL (ref 80.0–100.0)
Platelets: 268 10*3/uL (ref 150–400)
RBC: 6.06 MIL/uL — ABNORMAL HIGH (ref 4.22–5.81)
RDW: 13.2 % (ref 11.5–15.5)
WBC: 12.3 10*3/uL — ABNORMAL HIGH (ref 4.0–10.5)
nRBC: 0 % (ref 0.0–0.2)

## 2020-06-10 MED ORDER — DICYCLOMINE HCL 20 MG PO TABS
20.0000 mg | ORAL_TABLET | Freq: Four times a day (QID) | ORAL | 0 refills | Status: DC
Start: 2020-06-10 — End: 2021-04-09

## 2020-06-10 MED ORDER — LACTULOSE 10 GM/15ML PO SOLN
20.0000 g | Freq: Every day | ORAL | 0 refills | Status: DC | PRN
Start: 2020-06-10 — End: 2020-08-27

## 2020-06-10 MED ORDER — IOHEXOL 300 MG/ML  SOLN
125.0000 mL | Freq: Once | INTRAMUSCULAR | Status: AC | PRN
  Administered 2020-06-10: 125 mL via INTRAVENOUS

## 2020-06-10 NOTE — ED Notes (Signed)
Pt up to front desk, states he is now having bright red blood in stools, states this has started since being back today.  See new orders.

## 2020-06-10 NOTE — ED Notes (Signed)
No answer when called several times from lobby 

## 2020-06-10 NOTE — ED Triage Notes (Signed)
Pt in via EMS from home. EMS reports pt was discharged from here this am after a bowel blockage. Pt reports pain is worse in his lower abd and rectum.

## 2020-06-10 NOTE — ED Notes (Signed)
Pt transported to CT ?

## 2020-06-10 NOTE — ED Triage Notes (Signed)
Says was just discharged this am after abd blockage.  sjays he got home and pain started up again in abd.  Also having trouble urinating.

## 2020-06-10 NOTE — ED Notes (Signed)
Pt transported back to ED. 

## 2020-06-10 NOTE — Discharge Instructions (Signed)
1.  You may take Lactulose as needed for bowel movements. 2.  Take Bentyl as needed for abdominal discomfort. 3.  Return to the ER for worsening symptoms, persistent vomiting, difficulty breathing or other concerns

## 2020-06-12 ENCOUNTER — Ambulatory Visit
Admission: RE | Admit: 2020-06-12 | Discharge: 2020-06-12 | Disposition: A | Discharge status: Home / Self Care | Payer: Medicare Other | Attending: Family Medicine | Admitting: Family Medicine

## 2020-06-12 ENCOUNTER — Ambulatory Visit
Admission: RE | Admit: 2020-06-12 | Discharge: 2020-06-12 | Disposition: A | Discharge status: Home / Self Care | Payer: Medicare Other | Source: Ambulatory Visit | Attending: Family Medicine | Admitting: Family Medicine

## 2020-06-12 ENCOUNTER — Other Ambulatory Visit: Payer: Self-pay | Admitting: Family Medicine

## 2020-06-12 ENCOUNTER — Other Ambulatory Visit: Payer: Self-pay | Admitting: Gastroenterology

## 2020-06-12 DIAGNOSIS — M542 Cervicalgia: Secondary | ICD-10-CM | POA: Diagnosis not present

## 2020-06-12 DIAGNOSIS — R748 Abnormal levels of other serum enzymes: Secondary | ICD-10-CM

## 2020-06-12 DIAGNOSIS — R0789 Other chest pain: Secondary | ICD-10-CM

## 2020-06-14 ENCOUNTER — Encounter: Payer: Medicare Other | Admitting: Physical Therapy

## 2020-06-18 ENCOUNTER — Encounter: Payer: Medicare Other | Admitting: Physical Therapy

## 2020-06-19 ENCOUNTER — Other Ambulatory Visit: Payer: Medicare Other

## 2020-06-19 ENCOUNTER — Ambulatory Visit: Payer: Medicare Other

## 2020-06-20 ENCOUNTER — Other Ambulatory Visit: Payer: Self-pay

## 2020-06-20 ENCOUNTER — Ambulatory Visit
Admission: RE | Admit: 2020-06-20 | Discharge: 2020-06-20 | Disposition: A | Discharge status: Home / Self Care | Payer: Medicare Other | Source: Ambulatory Visit | Attending: Gastroenterology | Admitting: Gastroenterology

## 2020-06-20 ENCOUNTER — Encounter: Payer: Medicare Other | Admitting: Physical Therapy

## 2020-06-20 DIAGNOSIS — R748 Abnormal levels of other serum enzymes: Secondary | ICD-10-CM | POA: Insufficient documentation

## 2020-06-21 ENCOUNTER — Encounter: Payer: Medicare Other | Admitting: Physical Therapy

## 2020-06-24 ENCOUNTER — Encounter: Payer: Medicare Other | Admitting: Physical Therapy

## 2020-06-26 ENCOUNTER — Ambulatory Visit: Payer: Medicare Other | Admitting: Urology

## 2020-06-26 ENCOUNTER — Other Ambulatory Visit: Payer: Self-pay

## 2020-06-26 ENCOUNTER — Ambulatory Visit (INDEPENDENT_AMBULATORY_CARE_PROVIDER_SITE_OTHER): Payer: Medicare Other | Admitting: Urology

## 2020-06-26 ENCOUNTER — Encounter: Payer: Self-pay | Admitting: Urology

## 2020-06-26 VITALS — BP 137/91 | HR 72 | Ht 68.0 in | Wt 259.0 lb

## 2020-06-26 DIAGNOSIS — N476 Balanoposthitis: Secondary | ICD-10-CM

## 2020-06-26 MED ORDER — NYSTATIN 100000 UNIT/GM EX CREA: 1.0000 "application " | TOPICAL_CREAM | Freq: Two times a day (BID) | CUTANEOUS | 0 refills | Status: DC

## 2020-06-26 NOTE — H&P (View-Only) (Signed)
06/26/2020 11:49 AM   [name] [birth year] [id number]  Referring provider: Preston Fleeting, MD [address] [place],  [place] [address]  Chief Complaint  Patient presents with  . Follow-up    HPI: Todd [name] is a 54 y.o. male with IC, BPH with LU TS and now possible yeast infection of the penis who presents today for an urgent appointment.  He states that for the last 2 weeks he has had penile itching and irritation.  He purchased some over-the-counter Monistat cream and has been applying that for the last few days with improvement.  He admits that his has been an ongoing issue with him on and off for the last several years and he is becoming fatigued concerning his penile hygiene.  He states he has to be diligent in keeping the foreskin and head of the penis clean or he will experience the aforementioned symptoms.  He is having to bring wet wipes to use in public restrooms or on trips and he is not wanting to have that extra burden anymore.  He is wondering if he is too old now to have a circumcision.  Patient denies any modifying or aggravating factors.  Patient denies any gross hematuria, dysuria or suprapubic/flank pain.  Patient denies any fevers, chills, nausea or vomiting.   Patient was recently in the hospital for abdominal pain on June 10, 2020.  Contrast CT noted a 2 mm nonobstructing left renal stone, a left malrotated kidney and a right 2 cm renal cyst.  KUB noted moderate stool burden and he was discharged on Bentyl and lactulose.  UA was negative at that visit.  His serum creatinine was normal at that visit.  He is now having 3 bowel movements daily as recommended by his GI physician.   He is not wanting to discontinue his oxybutynin as it is controlling his bladder pain at this time.  PMH: Past Medical History:  Diagnosis Date  . Anxiety   . 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 Chronic left shoulder pain 02/15/2016  . Depression     . Diabetes mellitus without complication (HCC)   . Dislocation of right thumb 06/05/2016  . Diverticulitis   . 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 Gastroparesis 06/08/2017  . GERD (gastroesophageal reflux disease)   . Headache    daily.  Migraines 2-3x/yr.  Marland Kitchen HTN (hypertension) 05/19/2016  . Hypertension   . MDD (major depressive disorder), recurrent episode (HCC) 05/28/2016  . Neck pain, bilateral 02/15/2016  . Primary osteoarthritis of right knee 01/31/2016   and fingers  . PTSD (post-traumatic stress disorder) 06/05/2016  . Severe recurrent major depression without psychotic features (HCC) 05/19/2016  . Sleep apnea    unable to tolerate CPAP  . Substance induced mood disorder (HCC) 05/19/2016  . Suicidal ideation 05/19/2016  . Type 2 diabetes mellitus with complication, without long-term current use of insulin (HCC) 06/08/2017  . Uncontrolled type 2 diabetes mellitus with hyperglycemia, without long-term current use of insulin (HCC) 03/12/2017  . Vertigo     Surgical History: Past Surgical History:  Procedure Laterality Date  . BACK SURGERY  11/209/18 and 10/08/17   x 2  . COLONOSCOPY WITH PROPOFOL N/A 01/31/2018   Procedure: COLONOSCOPY WITH PROPOFOL;  Surgeon: Scot Jun, MD;  Location: [facility] ENDOSCOPY;  Service: Endoscopy;  Laterality: N/A;  . ESOPHAGOGASTRODUODENOSCOPY (EGD) WITH PROPOFOL N/A 01/31/2018   Procedure: ESOPHAGOGASTRODUODENOSCOPY (EGD) WITH PROPOFOL;  Surgeon:  Scot Jun, MD;  Location: [facility] ENDOSCOPY;  Service: Endoscopy;  Laterality: N/A;  . LEG AMPUTATION BELOW KNEE     left  . NASAL SEPTOPLASTY W/ TURBINOPLASTY Bilateral 04/17/2020   Procedure: NASAL SEPTOPLASTY WITH INFERIORTURBINATE REDUCTION;  Surgeon: [name], MD;  Location[facility];  Service: ENT;  Laterality: Bilateral;  Diabetic - oral meds  . SHOULDER SURGERY Left     Home Medications:  Allergies as of 06/26/2020      Reactions   Bactrim  [sulfamethoxazole-trimethoprim] Itching, Other (See Comments)   Causes blisters   Myrbetriq [mirabegron] Other (See Comments)   Dizzy, headache   Benadryl [diphenhydramine] Anxiety, Other (See Comments)   Causes "jerking"   Diphenhydramine Hcl Anxiety, Other (See Comments)   Causes "jerking"   Tamsulosin Rash      Medication List       Accurate as of June 26, 2020 11:49 AM. If you have any questions, ask your nurse or doctor.        amLODipine 5 MG tablet Commonly known as: NORVASC Take 5 mg by mouth daily.   amoxicillin-clavulanate 875-125 MG tablet Commonly known as: Augmentin Take 1 tablet by mouth 2 (two) times daily.   atorvastatin 40 MG tablet Commonly known as: LIPITOR Take 40 mg by mouth at bedtime.   bisacodyl 10 MG suppository Commonly known as: DULCOLAX Place 10 mg rectally daily as needed.   cloNIDine 0.2 MG tablet Commonly known as: CATAPRES Take 0.2 mg by mouth at bedtime.   dicyclomine 20 MG tablet Commonly known as: BENTYL Take 1 tablet (20 mg total) by mouth every 6 (six) hours.   Farxiga 10 MG Tabs tablet Generic drug: dapagliflozin propanediol Take 10 mg by mouth daily.   fluticasone 50 MCG/ACT nasal spray Commonly known as: FLONASE Place 2 sprays into both nostrils daily.   glipiZIDE 10 MG 24 hr tablet Commonly known as: GLUCOTROL XL Take 10 mg by mouth daily.   hydrochlorothiazide 12.5 MG tablet Commonly known as: HYDRODIURIL Take 12.5 mg by mouth daily.   Januvia 100 MG tablet Generic drug: sitaGLIPtin TAKE 1 TABLET(100 MG) BY MOUTH EVERY DAY   lactulose 10 GM/15ML solution Commonly known as: CHRONULAC Take 30 mLs (20 g total) by mouth daily as needed for mild constipation.   lisinopril 40 MG tablet Commonly known as: ZESTRIL Take 1 tablet (40 mg total) by mouth daily.   meclizine 25 MG tablet Commonly known as: ANTIVERT Take 1 tablet (25 mg total) by mouth 3 (three) times daily as needed for dizziness.   mirabegron  ER 50 MG Tb24 tablet Commonly known as: MYRBETRIQ Take by mouth.   nystatin cream Commonly known as: MYCOSTATIN Apply 1 application topically 2 (two) times daily. Started by: Michiel Cowboy, PA-C   omeprazole 40 MG capsule Commonly known as: PRILOSEC Take 40 mg by mouth 2 (two) times daily.   ondansetron 4 MG tablet Commonly known as: Zofran Take 1 tablet (4 mg total) by mouth every 8 (eight) hours as needed for up to 10 doses for nausea or vomiting.   oxybutynin 10 MG 24 hr tablet Commonly known as: DITROPAN-XL Take 10 mg by mouth daily.   oxybutynin 15 MG 24 hr tablet Commonly known as: DITROPAN XL Take 15 mg by mouth daily.   scopolamine 1 MG/3DAYS Commonly known as: TRANSDERM-SCOP 1 patch every 3 (three) days.   Stimulant Laxative 8.6-50 MG tablet Generic drug: senna-docusate Take 2 tablets by mouth 2 (two) times daily as needed.  tamsulosin 0.4 MG Caps capsule Commonly known as: FLOMAX Take 0.4 mg by mouth daily.       Allergies:  Allergies  Allergen Reactions  . Bactrim [Sulfamethoxazole-Trimethoprim] Itching and Other (See Comments)    Causes blisters  . Myrbetriq [Mirabegron] Other (See Comments)    Dizzy, headache  . Benadryl [Diphenhydramine] Anxiety and Other (See Comments)    Causes "jerking"  . Diphenhydramine Hcl Anxiety and Other (See Comments)    Causes "jerking"  . Tamsulosin Rash    Family History: Family History  Problem Relation Age of Onset  . Diabetes Mother     Social History:  reports that he quit smoking about 31 years ago. His smoking use included cigarettes. He has a 11.00 pack-year smoking history. He has never used smokeless tobacco. He reports that he does not drink alcohol and does not use drugs.  ROS: Pertinent ROS in HPI  Physical Exam: BP (!) 137/91 (BP Location: Left Arm, Patient Position: Sitting, Cuff Size: Normal)   Pulse 72   Ht 5\' 8"  (1.727 m)   Wt 259 lb (117.5 kg)   BMI 39.38 kg/m   Constitutional:   Well nourished. Alert and oriented, No acute distress. HEENT: [place] AT, mask in place.  Trachea midline Cardiovascular: No clubbing, cyanosis, or edema. Respiratory: Normal respiratory effort, no increased work of breathing. GU: No CVA tenderness.  No bladder fullness or masses.  Patient with uncircumcised phallus.  Foreskin easily retracted.  Glans and foreskin with erythema, urethral meatus is patent.  No penile discharge. No penile lesions or rashes.  Neurologic: Grossly intact, no focal deficits, moving all 4 extremities. Psychiatric: Normal mood and affect.  Laboratory Data: Lab Results  Component Value Date   WBC 12.3 (H) 06/10/2020   HGB 16.7 06/10/2020   HCT 49.0 06/10/2020   MCV 80.9 06/10/2020   PLT 268 06/10/2020    Lab Results  Component Value Date   CREATININE 0.98 06/10/2020    Lab Results  Component Value Date   AST 35 06/10/2020   Lab Results  Component Value Date   ALT 46 (H) 06/10/2020    Urinalysis    Component Value Date/Time   COLORURINE STRAW (A) 06/09/2020 0254   APPEARANCEUR CLEAR (A) 06/09/2020 0254   APPEARANCEUR Clear 02/22/2020 1033   LABSPEC 1.008 06/09/2020 0254   PHURINE 6.0 06/09/2020 0254   GLUCOSEU >=500 (A) 06/09/2020 0254   HGBUR NEGATIVE 06/09/2020 0254   BILIRUBINUR NEGATIVE 06/09/2020 0254   BILIRUBINUR Negative 02/22/2020 1033   KETONESUR 5 (A) 06/09/2020 0254   PROTEINUR NEGATIVE 06/09/2020 0254   NITRITE NEGATIVE 06/09/2020 0254   LEUKOCYTESUR NEGATIVE 06/09/2020 0254   I have reviewed the labs.   Pertinent Imaging: CLINICAL DATA:  Recurrent abdominal pain  EXAM: CT ABDOMEN AND PELVIS WITH CONTRAST  TECHNIQUE: Multidetector CT imaging of the abdomen and pelvis was performed using the standard protocol following bolus administration of intravenous contrast.  CONTRAST:  06/11/2020 OMNIPAQUE IOHEXOL 300 MG/ML  SOLN  COMPARISON:  03/22/2019  FINDINGS: Lower chest: No acute pleural or parenchymal lung  disease.  Hepatobiliary: Diffuse decreased attenuation of the liver parenchyma without focal abnormality. No biliary dilation. The gallbladder is unremarkable.  Pancreas: Unremarkable. No pancreatic ductal dilatation or surrounding inflammatory changes.  Spleen: Normal in size without focal abnormality.  Adrenals/Urinary Tract: Stable punctate less than 2 mm nonobstructing left renal calculus, reference image 58/2. 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 Stable right renal cysts. The adrenals are unremarkable. Bladder is grossly  normal.  Stomach/Bowel: No bowel obstruction or ileus. Normal appendix right lower quadrant. Scattered diverticulosis of the sigmoid colon without evidence of acute diverticulitis.  Vascular/Lymphatic: Aortic atherosclerosis. No enlarged abdominal or pelvic lymph nodes.  Reproductive: Prostate is unremarkable.  Other: No free fluid or free gas.  No abdominal wall hernia.  Musculoskeletal: No acute or destructive bony lesions. Stable postsurgical changes at the lumbosacral junction. Reconstructed images demonstrate no additional findings.  IMPRESSION: 1. Stable punctate less than 2 mm nonobstructing left renal calculus. 2. Sigmoid diverticulosis without acute diverticulitis. 3. Stable fatty liver. 4. Aortic Atherosclerosis (ICD10-I70.0).   Electronically Signed   By: Michael  Brown M.D.   On: 06/10/2020 02:15 I have independently reviewed the films.  See HPI.   Assessment & Plan:   1. Balanoposthitis Patient will discontinue Monistat cream and start nystatin cream I have instructed him to pull back the foreskin and to clean with soapy water and thoroughly dry the glans and foreskin before applying the nystatin cream twice daily He would like to be scheduled for a circumcision.  I discussed how the in office circumcision is performed under penile block with the use of absorbable sutures.  I explained that after the  circumcision, he will experience penile pain, penile swelling, bleeding/oozing from the incision site.  There is also a risk of infection, bleeding and penile deformity after undergoing a circumcision.  He understands these risks and wishes to proceed with a circumcision. Schedule an office circumcision with Dr. Ashley Brandon  Return for Circumcision with Dr. Brandon.  These notes generated with voice recognition software. I apologize for typographical errors.  Karver Fadden, PA-C  [facility] [address]  [address] , [place] [address] [phone]  

## 2020-06-26 NOTE — Patient Instructions (Signed)
Pre-Cicumcision Instructions  STOP all aspirin or blood thinners (Aspirin, Plavix, Coumadin, Warfarin, Motrin, Ibuprofen, Advil, Aleve, Naproxen, Naprosyn) for 7 days prior to the procedure.  If you have any questions about stopping these medications please contact your primary care physician or cardiologist.  Shave all hair from the upper scrotum on the day of the procedure.  This means just under the penis onto the scrotal sac.  The area shaved should measure about 2-3 inches around.  You may lather the scrotum with soap and water, and shave with a safety razor.  After shaving the area, thoroughly wash the penis and the scrotum, then shower or bathe to remove all the loose hairs.  If needed, wash the area again just before coming in for your circumcision.  It is recommended to have a light meal an hour or so prior to the procedure.  Bring a scrotal support (jock strap or suspensory, or tight jockey shorts or underwear).  Wear comfortable pants or shorts.  Bring someone with you to drive you home.  If you have any questions or concerns, please feel free to call the office at [phone]

## 2020-06-26 NOTE — Progress Notes (Signed)
06/26/2020 11:49 AM   [name] [birth year] [id number]  Referring provider: Preston Fleeting, MD [address] [place],  [place] [address]  Chief Complaint  Patient presents with  . Follow-up    HPI: Todd [name] is a 54 y.o. male with IC, BPH with LU TS and now possible yeast infection of the penis who presents today for an urgent appointment.  He states that for the last 2 weeks he has had penile itching and irritation.  He purchased some over-the-counter Monistat cream and has been applying that for the last few days with improvement.  He admits that his has been an ongoing issue with him on and off for the last several years and he is becoming fatigued concerning his penile hygiene.  He states he has to be diligent in keeping the foreskin and head of the penis clean or he will experience the aforementioned symptoms.  He is having to bring wet wipes to use in public restrooms or on trips and he is not wanting to have that extra burden anymore.  He is wondering if he is too old now to have a circumcision.  Patient denies any modifying or aggravating factors.  Patient denies any gross hematuria, dysuria or suprapubic/flank pain.  Patient denies any fevers, chills, nausea or vomiting.   Patient was recently in the hospital for abdominal pain on June 10, 2020.  Contrast CT noted a 2 mm nonobstructing left renal stone, a left malrotated kidney and a right 2 cm renal cyst.  KUB noted moderate stool burden and he was discharged on Bentyl and lactulose.  UA was negative at that visit.  His serum creatinine was normal at that visit.  He is now having 3 bowel movements daily as recommended by his GI physician.   He is not wanting to discontinue his oxybutynin as it is controlling his bladder pain at this time.  PMH: Past Medical History:  Diagnosis Date  . Anxiety   . 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 Chronic left shoulder pain 02/15/2016  . Depression     . Diabetes mellitus without complication (HCC)   . Dislocation of right thumb 06/05/2016  . Diverticulitis   . 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 Gastroparesis 06/08/2017  . GERD (gastroesophageal reflux disease)   . Headache    daily.  Migraines 2-3x/yr.  Marland Kitchen HTN (hypertension) 05/19/2016  . Hypertension   . MDD (major depressive disorder), recurrent episode (HCC) 05/28/2016  . Neck pain, bilateral 02/15/2016  . Primary osteoarthritis of right knee 01/31/2016   and fingers  . PTSD (post-traumatic stress disorder) 06/05/2016  . Severe recurrent major depression without psychotic features (HCC) 05/19/2016  . Sleep apnea    unable to tolerate CPAP  . Substance induced mood disorder (HCC) 05/19/2016  . Suicidal ideation 05/19/2016  . Type 2 diabetes mellitus with complication, without long-term current use of insulin (HCC) 06/08/2017  . Uncontrolled type 2 diabetes mellitus with hyperglycemia, without long-term current use of insulin (HCC) 03/12/2017  . Vertigo     Surgical History: Past Surgical History:  Procedure Laterality Date  . BACK SURGERY  11/209/18 and 10/08/17   x 2  . COLONOSCOPY WITH PROPOFOL N/A 01/31/2018   Procedure: COLONOSCOPY WITH PROPOFOL;  Surgeon: Scot Jun, MD;  Location: [facility] ENDOSCOPY;  Service: Endoscopy;  Laterality: N/A;  . ESOPHAGOGASTRODUODENOSCOPY (EGD) WITH PROPOFOL N/A 01/31/2018   Procedure: ESOPHAGOGASTRODUODENOSCOPY (EGD) WITH PROPOFOL;  Surgeon:  Scot Jun, MD;  Location: [facility] ENDOSCOPY;  Service: Endoscopy;  Laterality: N/A;  . LEG AMPUTATION BELOW KNEE     left  . NASAL SEPTOPLASTY W/ TURBINOPLASTY Bilateral 04/17/2020   Procedure: NASAL SEPTOPLASTY WITH INFERIORTURBINATE REDUCTION;  Surgeon: [name], MD;  Location[facility];  Service: ENT;  Laterality: Bilateral;  Diabetic - oral meds  . SHOULDER SURGERY Left     Home Medications:  Allergies as of 06/26/2020      Reactions   Bactrim  [sulfamethoxazole-trimethoprim] Itching, Other (See Comments)   Causes blisters   Myrbetriq [mirabegron] Other (See Comments)   Dizzy, headache   Benadryl [diphenhydramine] Anxiety, Other (See Comments)   Causes "jerking"   Diphenhydramine Hcl Anxiety, Other (See Comments)   Causes "jerking"   Tamsulosin Rash      Medication List       Accurate as of June 26, 2020 11:49 AM. If you have any questions, ask your nurse or doctor.        amLODipine 5 MG tablet Commonly known as: NORVASC Take 5 mg by mouth daily.   amoxicillin-clavulanate 875-125 MG tablet Commonly known as: Augmentin Take 1 tablet by mouth 2 (two) times daily.   atorvastatin 40 MG tablet Commonly known as: LIPITOR Take 40 mg by mouth at bedtime.   bisacodyl 10 MG suppository Commonly known as: DULCOLAX Place 10 mg rectally daily as needed.   cloNIDine 0.2 MG tablet Commonly known as: CATAPRES Take 0.2 mg by mouth at bedtime.   dicyclomine 20 MG tablet Commonly known as: BENTYL Take 1 tablet (20 mg total) by mouth every 6 (six) hours.   Farxiga 10 MG Tabs tablet Generic drug: dapagliflozin propanediol Take 10 mg by mouth daily.   fluticasone 50 MCG/ACT nasal spray Commonly known as: FLONASE Place 2 sprays into both nostrils daily.   glipiZIDE 10 MG 24 hr tablet Commonly known as: GLUCOTROL XL Take 10 mg by mouth daily.   hydrochlorothiazide 12.5 MG tablet Commonly known as: HYDRODIURIL Take 12.5 mg by mouth daily.   Januvia 100 MG tablet Generic drug: sitaGLIPtin TAKE 1 TABLET(100 MG) BY MOUTH EVERY DAY   lactulose 10 GM/15ML solution Commonly known as: CHRONULAC Take 30 mLs (20 g total) by mouth daily as needed for mild constipation.   lisinopril 40 MG tablet Commonly known as: ZESTRIL Take 1 tablet (40 mg total) by mouth daily.   meclizine 25 MG tablet Commonly known as: ANTIVERT Take 1 tablet (25 mg total) by mouth 3 (three) times daily as needed for dizziness.   mirabegron  ER 50 MG Tb24 tablet Commonly known as: MYRBETRIQ Take by mouth.   nystatin cream Commonly known as: MYCOSTATIN Apply 1 application topically 2 (two) times daily. Started by: Michiel Cowboy, PA-C   omeprazole 40 MG capsule Commonly known as: PRILOSEC Take 40 mg by mouth 2 (two) times daily.   ondansetron 4 MG tablet Commonly known as: Zofran Take 1 tablet (4 mg total) by mouth every 8 (eight) hours as needed for up to 10 doses for nausea or vomiting.   oxybutynin 10 MG 24 hr tablet Commonly known as: DITROPAN-XL Take 10 mg by mouth daily.   oxybutynin 15 MG 24 hr tablet Commonly known as: DITROPAN XL Take 15 mg by mouth daily.   scopolamine 1 MG/3DAYS Commonly known as: TRANSDERM-SCOP 1 patch every 3 (three) days.   Stimulant Laxative 8.6-50 MG tablet Generic drug: senna-docusate Take 2 tablets by mouth 2 (two) times daily as needed.  tamsulosin 0.4 MG Caps capsule Commonly known as: FLOMAX Take 0.4 mg by mouth daily.       Allergies:  Allergies  Allergen Reactions  . Bactrim [Sulfamethoxazole-Trimethoprim] Itching and Other (See Comments)    Causes blisters  . Myrbetriq [Mirabegron] Other (See Comments)    Dizzy, headache  . Benadryl [Diphenhydramine] Anxiety and Other (See Comments)    Causes "jerking"  . Diphenhydramine Hcl Anxiety and Other (See Comments)    Causes "jerking"  . Tamsulosin Rash    Family History: Family History  Problem Relation Age of Onset  . Diabetes Mother     Social History:  reports that he quit smoking about 31 years ago. His smoking use included cigarettes. He has a 11.00 pack-year smoking history. He has never used smokeless tobacco. He reports that he does not drink alcohol and does not use drugs.  ROS: Pertinent ROS in HPI  Physical Exam: BP (!) 137/91 (BP Location: Left Arm, Patient Position: Sitting, Cuff Size: Normal)   Pulse 72   Ht 5\' 8"  (1.727 m)   Wt 259 lb (117.5 kg)   BMI 39.38 kg/m   Constitutional:   Well nourished. Alert and oriented, No acute distress. HEENT: [place] AT, mask in place.  Trachea midline Cardiovascular: No clubbing, cyanosis, or edema. Respiratory: Normal respiratory effort, no increased work of breathing. GU: No CVA tenderness.  No bladder fullness or masses.  Patient with uncircumcised phallus.  Foreskin easily retracted.  Glans and foreskin with erythema, urethral meatus is patent.  No penile discharge. No penile lesions or rashes.  Neurologic: Grossly intact, no focal deficits, moving all 4 extremities. Psychiatric: Normal mood and affect.  Laboratory Data: Lab Results  Component Value Date   WBC 12.3 (H) 06/10/2020   HGB 16.7 06/10/2020   HCT 49.0 06/10/2020   MCV 80.9 06/10/2020   PLT 268 06/10/2020    Lab Results  Component Value Date   CREATININE 0.98 06/10/2020    Lab Results  Component Value Date   AST 35 06/10/2020   Lab Results  Component Value Date   ALT 46 (H) 06/10/2020    Urinalysis    Component Value Date/Time   COLORURINE STRAW (A) 06/09/2020 0254   APPEARANCEUR CLEAR (A) 06/09/2020 0254   APPEARANCEUR Clear 02/22/2020 1033   LABSPEC 1.008 06/09/2020 0254   PHURINE 6.0 06/09/2020 0254   GLUCOSEU >=500 (A) 06/09/2020 0254   HGBUR NEGATIVE 06/09/2020 0254   BILIRUBINUR NEGATIVE 06/09/2020 0254   BILIRUBINUR Negative 02/22/2020 1033   KETONESUR 5 (A) 06/09/2020 0254   PROTEINUR NEGATIVE 06/09/2020 0254   NITRITE NEGATIVE 06/09/2020 0254   LEUKOCYTESUR NEGATIVE 06/09/2020 0254   I have reviewed the labs.   Pertinent Imaging: CLINICAL DATA:  Recurrent abdominal pain  EXAM: CT ABDOMEN AND PELVIS WITH CONTRAST  TECHNIQUE: Multidetector CT imaging of the abdomen and pelvis was performed using the standard protocol following bolus administration of intravenous contrast.  CONTRAST:  06/11/2020 OMNIPAQUE IOHEXOL 300 MG/ML  SOLN  COMPARISON:  03/22/2019  FINDINGS: Lower chest: No acute pleural or parenchymal lung  disease.  Hepatobiliary: Diffuse decreased attenuation of the liver parenchyma without focal abnormality. No biliary dilation. The gallbladder is unremarkable.  Pancreas: Unremarkable. No pancreatic ductal dilatation or surrounding inflammatory changes.  Spleen: Normal in size without focal abnormality.  Adrenals/Urinary Tract: Stable punctate less than 2 mm nonobstructing left renal calculus, reference image 58/2. 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 Stable right renal cysts. The adrenals are unremarkable. Bladder is grossly  normal.  Stomach/Bowel: No bowel obstruction or ileus. Normal appendix right lower quadrant. Scattered diverticulosis of the sigmoid colon without evidence of acute diverticulitis.  Vascular/Lymphatic: Aortic atherosclerosis. No enlarged abdominal or pelvic lymph nodes.  Reproductive: Prostate is unremarkable.  Other: No free fluid or free gas.  No abdominal wall hernia.  Musculoskeletal: No acute or destructive bony lesions. Stable postsurgical changes at the lumbosacral junction. Reconstructed images demonstrate no additional findings.  IMPRESSION: 1. Stable punctate less than 2 mm nonobstructing left renal calculus. 2. Sigmoid diverticulosis without acute diverticulitis. 3. Stable fatty liver. 4. Aortic Atherosclerosis (ICD10-I70.0).   Electronically Signed   By: Sharlet SalinaMichael  Brown M.D.   On: 06/10/2020 02:15 I have independently reviewed the films.  See HPI.   Assessment & Plan:   1. Balanoposthitis Patient will discontinue Monistat cream and start nystatin cream I have instructed him to pull back the foreskin and to clean with soapy water and thoroughly dry the glans and foreskin before applying the nystatin cream twice daily He would like to be scheduled for a circumcision.  I discussed how the in office circumcision is performed under penile block with the use of absorbable sutures.  I explained that after the  circumcision, he will experience penile pain, penile swelling, bleeding/oozing from the incision site.  There is also a risk of infection, bleeding and penile deformity after undergoing a circumcision.  He understands these risks and wishes to proceed with a circumcision. Schedule an office circumcision with Dr. Vanna ScotlandAshley Brandon  Return for Circumcision with Dr. Apolinar JunesBrandon.  These notes generated with voice recognition software. I apologize for typographical errors.  Michiel CowboySHANNON Jaxyn Mestas, PA-C  [facility] [address]  [address] [place], [place] [address] [phone]

## 2020-06-27 ENCOUNTER — Encounter: Payer: Medicare Other | Admitting: Physical Therapy

## 2020-07-01 ENCOUNTER — Encounter: Payer: Medicare Other | Admitting: Physical Therapy

## 2020-07-02 ENCOUNTER — Ambulatory Visit: Payer: Self-pay | Admitting: Urology

## 2020-07-04 ENCOUNTER — Encounter: Payer: Medicare Other | Admitting: Physical Therapy

## 2020-07-08 ENCOUNTER — Encounter: Payer: Medicare Other | Admitting: Physical Therapy

## 2020-07-11 ENCOUNTER — Encounter: Payer: Medicare Other | Admitting: Physical Therapy

## 2020-07-17 ENCOUNTER — Ambulatory Visit: Payer: Medicare Other | Admitting: Urology

## 2020-07-23 NOTE — H&P (View-Only) (Signed)
07/24/2020 3:15 PM   Todd [name] November 15, 1965 [id number]  Referring provider: Preston Fleeting, MD [address] [place],  [place] [address] Chief Complaint  Patient presents with  . Follow-up    HPI: Todd [name] is a 54 y.o. male who returns today for further discussion regarding circumcision procedure.   The patient was originally scheduled for a circumcision today. He is very anxious. He has a pain in his chest and feels tired today. He is willing to return tomorrow for procedure. He notes that his brother can drive for him.   He reports a lot of issues with his bladder.   He continues to have itching at the shaft of his penis. He had no relief with nystatin power per Michiel Cowboy, PA-C.   Denies being on blood thinners.   PMH: Past Medical History:  Diagnosis Date  . Anxiety   . 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 Chronic left shoulder pain 02/15/2016  . Depression   . Diabetes mellitus without complication (HCC)   . Dislocation of right thumb 06/05/2016  . Diverticulitis   . 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 Gastroparesis 06/08/2017  . GERD (gastroesophageal reflux disease)   . Headache    daily.  Migraines 2-3x/yr.  Marland Kitchen HTN (hypertension) 05/19/2016  . Hypertension   . MDD (major depressive disorder), recurrent episode (HCC) 05/28/2016  . Neck pain, bilateral 02/15/2016  . Primary osteoarthritis of right knee 01/31/2016   and fingers  . PTSD (post-traumatic stress disorder) 06/05/2016  . Severe recurrent major depression without psychotic features (HCC) 05/19/2016  . Sleep apnea    unable to tolerate CPAP  . Substance induced mood disorder (HCC) 05/19/2016  . Suicidal ideation 05/19/2016  . Type 2 diabetes mellitus with complication, without long-term current use of insulin (HCC) 06/08/2017  . Uncontrolled type 2 diabetes mellitus with hyperglycemia, without long-term current use of insulin (HCC) 03/12/2017  . Vertigo      Surgical History: Past Surgical History:  Procedure Laterality Date  . BACK SURGERY  11/209/18 and 10/08/17   x 2  . COLONOSCOPY WITH PROPOFOL N/A 01/31/2018   Procedure: COLONOSCOPY WITH PROPOFOL;  Surgeon: Scot Jun, MD;  Location: [facility] ENDOSCOPY;  Service: Endoscopy;  Laterality: N/A;  . ESOPHAGOGASTRODUODENOSCOPY (EGD) WITH PROPOFOL N/A 01/31/2018   Procedure: ESOPHAGOGASTRODUODENOSCOPY (EGD) WITH PROPOFOL;  Surgeon: Scot Jun, MD;  Location: [facility] ENDOSCOPY;  Service: Endoscopy;  Laterality: N/A;  . LEG AMPUTATION BELOW KNEE     left  . NASAL SEPTOPLASTY W/ TURBINOPLASTY Bilateral 04/17/2020   Procedure: NASAL SEPTOPLASTY WITH INFERIORTURBINATE REDUCTION;  Surgeon: [name], MD;  Location: [facility];  Service: ENT;  Laterality: Bilateral;  Diabetic - oral meds  . SHOULDER SURGERY Left     Home Medications:  Allergies as of 07/24/2020      Reactions   Bactrim [sulfamethoxazole-trimethoprim] Itching, Other (See Comments)   Causes blisters   Myrbetriq [mirabegron] Other (See Comments)   Dizzy, headache   Benadryl [diphenhydramine] Anxiety, Other (See Comments)   Causes "jerking"   Diphenhydramine Hcl Anxiety, Other (See Comments)   Causes "jerking"   Tamsulosin Rash      Medication List       Accurate as of July 24, 2020  3:15 PM. If you have any questions, ask your nurse or doctor.        STOP taking these medications   scopolamine 1 MG/3DAYS Commonly known as: TRANSDERM-SCOP Stopped  by: [name], MD   Stimulant Laxative 8.6-50 MG tablet Generic drug: senna-docusate Stopped by: [name], MD     TAKE these medications   amLODipine 5 MG tablet Commonly known as: NORVASC Take 5 mg by mouth daily.   amoxicillin-clavulanate 875-125 MG tablet Commonly known as: Augmentin Take 1 tablet by mouth 2 (two) times daily.   atorvastatin 40 MG tablet Commonly known as: LIPITOR Take 40 mg by mouth at bedtime.    bisacodyl 10 MG suppository Commonly known as: DULCOLAX Place 10 mg rectally daily as needed.   cloNIDine 0.2 MG tablet Commonly known as: CATAPRES Take 0.2 mg by mouth at bedtime.   dicyclomine 20 MG tablet Commonly known as: BENTYL Take 1 tablet (20 mg total) by mouth every 6 (six) hours.   Farxiga 10 MG Tabs tablet Generic drug: dapagliflozin propanediol Take 10 mg by mouth daily.   fluticasone 50 MCG/ACT nasal spray Commonly known as: FLONASE Place 2 sprays into both nostrils daily.   glipiZIDE 10 MG 24 hr tablet Commonly known as: GLUCOTROL XL Take 10 mg by mouth daily.   hydrochlorothiazide 12.5 MG tablet Commonly known as: HYDRODIURIL Take 12.5 mg by mouth daily.   Januvia 100 MG tablet Generic drug: sitaGLIPtin TAKE 1 TABLET(100 MG) BY MOUTH EVERY DAY   lactulose 10 GM/15ML solution Commonly known as: CHRONULAC Take 30 mLs (20 g total) by mouth daily as needed for mild constipation.   lisinopril 40 MG tablet Commonly known as: ZESTRIL Take 1 tablet (40 mg total) by mouth daily.   meclizine 25 MG tablet Commonly known as: ANTIVERT Take 1 tablet (25 mg total) by mouth 3 (three) times daily as needed for dizziness.   metoCLOPramide 10 MG tablet Commonly known as: REGLAN Take 10 mg by mouth 3 (three) times daily as needed.   mirabegron ER 50 MG Tb24 tablet Commonly known as: MYRBETRIQ Take by mouth.   nystatin cream Commonly known as: MYCOSTATIN Apply 1 application topically 2 (two) times daily.   omeprazole 40 MG capsule Commonly known as: PRILOSEC Take 40 mg by mouth 2 (two) times daily.   ondansetron 4 MG tablet Commonly known as: Zofran Take 1 tablet (4 mg total) by mouth every 8 (eight) hours as needed for up to 10 doses for nausea or vomiting.   oxybutynin 10 MG 24 hr tablet Commonly known as: DITROPAN-XL Take 10 mg by mouth daily. What changed: Another medication with the same name was removed. Continue taking this medication, and follow  the directions you see here. Changed by: [name], MD   tamsulosin 0.4 MG Caps capsule Commonly known as: FLOMAX Take 0.4 mg by mouth daily.       Allergies:  Allergies  Allergen Reactions  . Bactrim [Sulfamethoxazole-Trimethoprim] Itching and Other (See Comments)    Causes blisters  . Myrbetriq [Mirabegron] Other (See Comments)    Dizzy, headache  . Benadryl [Diphenhydramine] Anxiety and Other (See Comments)    Causes "jerking"  . Diphenhydramine Hcl Anxiety and Other (See Comments)    Causes "jerking"  . Tamsulosin Rash    Family History: Family History  Problem Relation Age of Onset  . Diabetes Mother     Social History:  reports that he quit smoking about 31 years ago. His smoking use included cigarettes. He has a 11.00 pack-year smoking history. He has never used smokeless tobacco. He reports that he does not drink alcohol and does not use drugs.   Physical Exam: BP (!) 141/81  Pulse 79   Constitutional:  Alert and oriented, No acute distress. HEENT: [place] AT, moist mucus membranes.  Trachea midline, no masses. Cardiovascular: No clubbing, cyanosis, or edema. Respiratory: Normal respiratory effort, no increased work of breathing. Skin: No rashes, bruises or suspicious lesions. Neurologic: Grossly intact, no focal deficits, moving all 4 extremities. Psychiatric: Normal mood and affect. Anxious.  Laboratory Data:  Lab Results  Component Value Date   CREATININE 0.98 06/10/2020    Assessment & Plan:    1. Balanoposthitis  Scheduled for office based circumcision today but developed extreme anxiety/panic attack and was unable to tolerate in the office  We discussed alternatives including pursuing this procedure in the operating room with sedation versus conservative management.  Elect to pursue this in the operating room.  We discussed the risk of surgery including the risk of bleeding, infection, damage surrounding structures, penile sensitivity, impaired  cosmesis amongst others.  Questions were answered.  Patient's younger brother will drive him after procedure.    [facility] [address], [address] [place], [place] [address] [phone]  I, Theador Hawthorne, am acting as a scribe for Dr. [name].  I have reviewed the above documentation for accuracy and completeness, and I agree with the above.   [name], MD

## 2020-07-23 NOTE — Progress Notes (Signed)
07/24/2020 3:15 PM   Todd [name] November 15, 1965 [id number]  Referring provider: Preston Fleeting, MD [address] [place],  [place] [address] Chief Complaint  Patient presents with  . Follow-up    HPI: Todd [name] is a 54 y.o. male who returns today for further discussion regarding circumcision procedure.   The patient was originally scheduled for a circumcision today. He is very anxious. He has a pain in his chest and feels tired today. He is willing to return tomorrow for procedure. He notes that his brother can drive for him.   He reports a lot of issues with his bladder.   He continues to have itching at the shaft of his penis. He had no relief with nystatin power per Michiel Cowboy, PA-C.   Denies being on blood thinners.   PMH: Past Medical History:  Diagnosis Date  . Anxiety   . 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 Chronic left shoulder pain 02/15/2016  . Depression   . Diabetes mellitus without complication (HCC)   . Dislocation of right thumb 06/05/2016  . Diverticulitis   . 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 Gastroparesis 06/08/2017  . GERD (gastroesophageal reflux disease)   . Headache    daily.  Migraines 2-3x/yr.  Marland Kitchen HTN (hypertension) 05/19/2016  . Hypertension   . MDD (major depressive disorder), recurrent episode (HCC) 05/28/2016  . Neck pain, bilateral 02/15/2016  . Primary osteoarthritis of right knee 01/31/2016   and fingers  . PTSD (post-traumatic stress disorder) 06/05/2016  . Severe recurrent major depression without psychotic features (HCC) 05/19/2016  . Sleep apnea    unable to tolerate CPAP  . Substance induced mood disorder (HCC) 05/19/2016  . Suicidal ideation 05/19/2016  . Type 2 diabetes mellitus with complication, without long-term current use of insulin (HCC) 06/08/2017  . Uncontrolled type 2 diabetes mellitus with hyperglycemia, without long-term current use of insulin (HCC) 03/12/2017  . Vertigo      Surgical History: Past Surgical History:  Procedure Laterality Date  . BACK SURGERY  11/209/18 and 10/08/17   x 2  . COLONOSCOPY WITH PROPOFOL N/A 01/31/2018   Procedure: COLONOSCOPY WITH PROPOFOL;  Surgeon: Scot Jun, MD;  Location: [facility] ENDOSCOPY;  Service: Endoscopy;  Laterality: N/A;  . ESOPHAGOGASTRODUODENOSCOPY (EGD) WITH PROPOFOL N/A 01/31/2018   Procedure: ESOPHAGOGASTRODUODENOSCOPY (EGD) WITH PROPOFOL;  Surgeon: Scot Jun, MD;  Location: [facility] ENDOSCOPY;  Service: Endoscopy;  Laterality: N/A;  . LEG AMPUTATION BELOW KNEE     left  . NASAL SEPTOPLASTY W/ TURBINOPLASTY Bilateral 04/17/2020   Procedure: NASAL SEPTOPLASTY WITH INFERIORTURBINATE REDUCTION;  Surgeon: [name], MD;  Location: [facility];  Service: ENT;  Laterality: Bilateral;  Diabetic - oral meds  . SHOULDER SURGERY Left     Home Medications:  Allergies as of 07/24/2020      Reactions   Bactrim [sulfamethoxazole-trimethoprim] Itching, Other (See Comments)   Causes blisters   Myrbetriq [mirabegron] Other (See Comments)   Dizzy, headache   Benadryl [diphenhydramine] Anxiety, Other (See Comments)   Causes "jerking"   Diphenhydramine Hcl Anxiety, Other (See Comments)   Causes "jerking"   Tamsulosin Rash      Medication List       Accurate as of July 24, 2020  3:15 PM. If you have any questions, ask your nurse or doctor.        STOP taking these medications   scopolamine 1 MG/3DAYS Commonly known as: TRANSDERM-SCOP Stopped  by: [name], MD   Stimulant Laxative 8.6-50 MG tablet Generic drug: senna-docusate Stopped by: [name], MD     TAKE these medications   amLODipine 5 MG tablet Commonly known as: NORVASC Take 5 mg by mouth daily.   amoxicillin-clavulanate 875-125 MG tablet Commonly known as: Augmentin Take 1 tablet by mouth 2 (two) times daily.   atorvastatin 40 MG tablet Commonly known as: LIPITOR Take 40 mg by mouth at bedtime.    bisacodyl 10 MG suppository Commonly known as: DULCOLAX Place 10 mg rectally daily as needed.   cloNIDine 0.2 MG tablet Commonly known as: CATAPRES Take 0.2 mg by mouth at bedtime.   dicyclomine 20 MG tablet Commonly known as: BENTYL Take 1 tablet (20 mg total) by mouth every 6 (six) hours.   Farxiga 10 MG Tabs tablet Generic drug: dapagliflozin propanediol Take 10 mg by mouth daily.   fluticasone 50 MCG/ACT nasal spray Commonly known as: FLONASE Place 2 sprays into both nostrils daily.   glipiZIDE 10 MG 24 hr tablet Commonly known as: GLUCOTROL XL Take 10 mg by mouth daily.   hydrochlorothiazide 12.5 MG tablet Commonly known as: HYDRODIURIL Take 12.5 mg by mouth daily.   Januvia 100 MG tablet Generic drug: sitaGLIPtin TAKE 1 TABLET(100 MG) BY MOUTH EVERY DAY   lactulose 10 GM/15ML solution Commonly known as: CHRONULAC Take 30 mLs (20 g total) by mouth daily as needed for mild constipation.   lisinopril 40 MG tablet Commonly known as: ZESTRIL Take 1 tablet (40 mg total) by mouth daily.   meclizine 25 MG tablet Commonly known as: ANTIVERT Take 1 tablet (25 mg total) by mouth 3 (three) times daily as needed for dizziness.   metoCLOPramide 10 MG tablet Commonly known as: REGLAN Take 10 mg by mouth 3 (three) times daily as needed.   mirabegron ER 50 MG Tb24 tablet Commonly known as: MYRBETRIQ Take by mouth.   nystatin cream Commonly known as: MYCOSTATIN Apply 1 application topically 2 (two) times daily.   omeprazole 40 MG capsule Commonly known as: PRILOSEC Take 40 mg by mouth 2 (two) times daily.   ondansetron 4 MG tablet Commonly known as: Zofran Take 1 tablet (4 mg total) by mouth every 8 (eight) hours as needed for up to 10 doses for nausea or vomiting.   oxybutynin 10 MG 24 hr tablet Commonly known as: DITROPAN-XL Take 10 mg by mouth daily. What changed: Another medication with the same name was removed. Continue taking this medication, and follow  the directions you see here. Changed by: [name], MD   tamsulosin 0.4 MG Caps capsule Commonly known as: FLOMAX Take 0.4 mg by mouth daily.       Allergies:  Allergies  Allergen Reactions  . Bactrim [Sulfamethoxazole-Trimethoprim] Itching and Other (See Comments)    Causes blisters  . Myrbetriq [Mirabegron] Other (See Comments)    Dizzy, headache  . Benadryl [Diphenhydramine] Anxiety and Other (See Comments)    Causes "jerking"  . Diphenhydramine Hcl Anxiety and Other (See Comments)    Causes "jerking"  . Tamsulosin Rash    Family History: Family History  Problem Relation Age of Onset  . Diabetes Mother     Social History:  reports that he quit smoking about 31 years ago. His smoking use included cigarettes. He has a 11.00 pack-year smoking history. He has never used smokeless tobacco. He reports that he does not drink alcohol and does not use drugs.   Physical Exam: BP (!) 141/81  Pulse 79   Constitutional:  Alert and oriented, No acute distress. HEENT: [place] AT, moist mucus membranes.  Trachea midline, no masses. Cardiovascular: No clubbing, cyanosis, or edema. Respiratory: Normal respiratory effort, no increased work of breathing. Skin: No rashes, bruises or suspicious lesions. Neurologic: Grossly intact, no focal deficits, moving all 4 extremities. Psychiatric: Normal mood and affect. Anxious.  Laboratory Data:  Lab Results  Component Value Date   CREATININE 0.98 06/10/2020    Assessment & Plan:    1. Balanoposthitis  Scheduled for office based circumcision today but developed extreme anxiety/panic attack and was unable to tolerate in the office  We discussed alternatives including pursuing this procedure in the operating room with sedation versus conservative management.  Elect to pursue this in the operating room.  We discussed the risk of surgery including the risk of bleeding, infection, damage surrounding structures, penile sensitivity, impaired  cosmesis amongst others.  Questions were answered.  Patient's younger brother will drive him after procedure.    [facility] [address], [address] [place], [place] [address] [phone]  I, Theador Hawthorne, am acting as a scribe for Dr. [name].  I have reviewed the above documentation for accuracy and completeness, and I agree with the above.   [name], MD

## 2020-07-24 ENCOUNTER — Ambulatory Visit (INDEPENDENT_AMBULATORY_CARE_PROVIDER_SITE_OTHER): Payer: Medicare Other | Admitting: Urology

## 2020-07-24 ENCOUNTER — Other Ambulatory Visit: Payer: Self-pay | Admitting: Radiology

## 2020-07-24 ENCOUNTER — Other Ambulatory Visit: Payer: Self-pay

## 2020-07-24 VITALS — BP 141/81 | HR 79

## 2020-07-24 DIAGNOSIS — N476 Balanoposthitis: Secondary | ICD-10-CM | POA: Diagnosis not present

## 2020-07-25 ENCOUNTER — Ambulatory Visit: Payer: Medicare Other | Admitting: Anesthesiology

## 2020-07-25 ENCOUNTER — Ambulatory Visit
Admission: RE | Admit: 2020-07-25 | Discharge: 2020-07-25 | Disposition: A | Discharge status: Home / Self Care | Payer: Medicare Other | Attending: Urology | Admitting: Urology

## 2020-07-25 ENCOUNTER — Other Ambulatory Visit: Payer: Self-pay

## 2020-07-25 ENCOUNTER — Encounter: Payer: Self-pay | Admitting: Urology

## 2020-07-25 ENCOUNTER — Other Ambulatory Visit
Admission: RE | Admit: 2020-07-25 | Discharge: 2020-07-25 | Disposition: A | Discharge status: Home / Self Care | Payer: Medicare Other | Source: Ambulatory Visit | Attending: Urology | Admitting: Urology

## 2020-07-25 ENCOUNTER — Encounter
Admission: RE | Disposition: A | Discharge status: Home / Self Care | Payer: Self-pay | Source: Home / Self Care | Attending: Urology

## 2020-07-25 DIAGNOSIS — Z881 Allergy status to other antibiotic agents status: Secondary | ICD-10-CM | POA: Diagnosis not present

## 2020-07-25 DIAGNOSIS — N200 Calculus of kidney: Secondary | ICD-10-CM | POA: Diagnosis not present

## 2020-07-25 DIAGNOSIS — Z7984 Long term (current) use of oral hypoglycemic drugs: Secondary | ICD-10-CM | POA: Diagnosis not present

## 2020-07-25 DIAGNOSIS — K760 Fatty (change of) liver, not elsewhere classified: Secondary | ICD-10-CM | POA: Diagnosis not present

## 2020-07-25 DIAGNOSIS — E1143 Type 2 diabetes mellitus with diabetic autonomic (poly)neuropathy: Secondary | ICD-10-CM | POA: Insufficient documentation

## 2020-07-25 DIAGNOSIS — Z6838 Body mass index (BMI) 38.0-38.9, adult: Secondary | ICD-10-CM | POA: Insufficient documentation

## 2020-07-25 DIAGNOSIS — I700 Atherosclerosis of aorta: Secondary | ICD-10-CM | POA: Diagnosis not present

## 2020-07-25 DIAGNOSIS — I10 Essential (primary) hypertension: Secondary | ICD-10-CM | POA: Insufficient documentation

## 2020-07-25 DIAGNOSIS — Z87891 Personal history of nicotine dependence: Secondary | ICD-10-CM | POA: Diagnosis not present

## 2020-07-25 DIAGNOSIS — Z79899 Other long term (current) drug therapy: Secondary | ICD-10-CM | POA: Diagnosis not present

## 2020-07-25 DIAGNOSIS — K219 Gastro-esophageal reflux disease without esophagitis: Secondary | ICD-10-CM | POA: Insufficient documentation

## 2020-07-25 DIAGNOSIS — G4730 Sleep apnea, unspecified: Secondary | ICD-10-CM | POA: Diagnosis not present

## 2020-07-25 DIAGNOSIS — N476 Balanoposthitis: Secondary | ICD-10-CM | POA: Diagnosis not present

## 2020-07-25 DIAGNOSIS — Z20822 Contact with and (suspected) exposure to covid-19: Secondary | ICD-10-CM | POA: Diagnosis not present

## 2020-07-25 DIAGNOSIS — K3184 Gastroparesis: Secondary | ICD-10-CM | POA: Diagnosis not present

## 2020-07-25 DIAGNOSIS — F32A Depression, unspecified: Secondary | ICD-10-CM | POA: Insufficient documentation

## 2020-07-25 DIAGNOSIS — Z888 Allergy status to other drugs, medicaments and biological substances status: Secondary | ICD-10-CM | POA: Insufficient documentation

## 2020-07-25 DIAGNOSIS — Q632 Ectopic kidney: Secondary | ICD-10-CM | POA: Diagnosis not present

## 2020-07-25 DIAGNOSIS — N401 Enlarged prostate with lower urinary tract symptoms: Secondary | ICD-10-CM | POA: Insufficient documentation

## 2020-07-25 DIAGNOSIS — Q6101 Congenital single renal cyst: Secondary | ICD-10-CM | POA: Insufficient documentation

## 2020-07-25 HISTORY — PX: CIRCUMCISION: SHX1350

## 2020-07-25 LAB — SARS CORONAVIRUS 2 BY RT PCR (HOSPITAL ORDER, PERFORMED IN ~~LOC~~ HOSPITAL LAB): SARS Coronavirus 2: NEGATIVE

## 2020-07-25 LAB — GLUCOSE, CAPILLARY
Glucose-Capillary: 140 mg/dL — ABNORMAL HIGH (ref 70–99)
Glucose-Capillary: 97 mg/dL (ref 70–99)

## 2020-07-25 SURGERY — CIRCUMCISION, ADULT
Anesthesia: General

## 2020-07-25 MED ORDER — CEFAZOLIN SODIUM-DEXTROSE 2-4 GM/100ML-% IV SOLN
INTRAVENOUS | Status: AC
  Filled 2020-07-25: qty 100

## 2020-07-25 MED ORDER — LIDOCAINE HCL (PF) 1 % IJ SOLN
INTRAMUSCULAR | Status: AC
  Filled 2020-07-25: qty 30

## 2020-07-25 MED ORDER — FENTANYL CITRATE (PF) 100 MCG/2ML IJ SOLN
INTRAMUSCULAR | Status: AC
  Filled 2020-07-25: qty 2

## 2020-07-25 MED ORDER — OXYCODONE-ACETAMINOPHEN 5-325 MG PO TABS: 1.0000 | ORAL_TABLET | ORAL | 0 refills | Status: DC | PRN

## 2020-07-25 MED ORDER — BACITRACIN ZINC 500 UNIT/GM EX OINT
TOPICAL_OINTMENT | CUTANEOUS | Status: DC | PRN
  Administered 2020-07-25: 1 via TOPICAL

## 2020-07-25 MED ORDER — SODIUM CHLORIDE 0.9 % IV SOLN: INTRAVENOUS | Status: DC

## 2020-07-25 MED ORDER — LIDOCAINE 1% INJECTION FOR CIRCUMCISION
INJECTION | INTRAVENOUS | Status: DC | PRN
  Administered 2020-07-25: 20 mL via SUBCUTANEOUS

## 2020-07-25 MED ORDER — CHLORHEXIDINE GLUCONATE 0.12 % MT SOLN
OROMUCOSAL | Status: AC
  Administered 2020-07-25: 15 mL via OROMUCOSAL
  Filled 2020-07-25: qty 15

## 2020-07-25 MED ORDER — ORAL CARE MOUTH RINSE: 15.0000 mL | Freq: Once | OROMUCOSAL | Status: AC

## 2020-07-25 MED ORDER — ACETAMINOPHEN 10 MG/ML IV SOLN
INTRAVENOUS | Status: AC
  Filled 2020-07-25: qty 100

## 2020-07-25 MED ORDER — MIDAZOLAM HCL 2 MG/2ML IJ SOLN
INTRAMUSCULAR | Status: AC
  Filled 2020-07-25: qty 2

## 2020-07-25 MED ORDER — DEXAMETHASONE SODIUM PHOSPHATE 10 MG/ML IJ SOLN
INTRAMUSCULAR | Status: DC | PRN
  Administered 2020-07-25: 10 mg via INTRAVENOUS

## 2020-07-25 MED ORDER — CHLORHEXIDINE GLUCONATE 0.12 % MT SOLN
OROMUCOSAL | Status: AC
  Filled 2020-07-25: qty 15

## 2020-07-25 MED ORDER — PROPOFOL 10 MG/ML IV BOLUS
INTRAVENOUS | Status: DC | PRN
  Administered 2020-07-25: 200 mg via INTRAVENOUS

## 2020-07-25 MED ORDER — ONDANSETRON HCL 4 MG/2ML IJ SOLN
INTRAMUSCULAR | Status: DC | PRN
  Administered 2020-07-25: 4 mg via INTRAVENOUS

## 2020-07-25 MED ORDER — FENTANYL CITRATE (PF) 100 MCG/2ML IJ SOLN: 25.0000 ug | INTRAMUSCULAR | Status: DC | PRN

## 2020-07-25 MED ORDER — LACTATED RINGERS IV SOLN: INTRAVENOUS | Status: DC | PRN

## 2020-07-25 MED ORDER — CEFAZOLIN SODIUM-DEXTROSE 2-4 GM/100ML-% IV SOLN
2.0000 g | INTRAVENOUS | Status: AC
  Administered 2020-07-25: 2 g via INTRAVENOUS

## 2020-07-25 MED ORDER — LIDOCAINE HCL (CARDIAC) PF 100 MG/5ML IV SOSY
PREFILLED_SYRINGE | INTRAVENOUS | Status: DC | PRN
  Administered 2020-07-25: 100 mg via INTRAVENOUS

## 2020-07-25 MED ORDER — CHLORHEXIDINE GLUCONATE 0.12 % MT SOLN: 15.0000 mL | Freq: Once | OROMUCOSAL | Status: AC

## 2020-07-25 MED ORDER — ACETAMINOPHEN 10 MG/ML IV SOLN
INTRAVENOUS | Status: DC | PRN
  Administered 2020-07-25: 1000 mg via INTRAVENOUS

## 2020-07-25 MED ORDER — PROMETHAZINE HCL 25 MG/ML IJ SOLN: 6.2500 mg | INTRAMUSCULAR | Status: DC | PRN

## 2020-07-25 MED ORDER — PROPOFOL 10 MG/ML IV BOLUS
INTRAVENOUS | Status: AC
  Filled 2020-07-25: qty 20

## 2020-07-25 MED ORDER — FENTANYL CITRATE (PF) 100 MCG/2ML IJ SOLN
INTRAMUSCULAR | Status: DC | PRN
Start: 2020-07-25 — End: 2020-07-25
  Administered 2020-07-25: 50 ug via INTRAVENOUS

## 2020-07-25 MED ORDER — BACITRACIN ZINC 500 UNIT/GM EX OINT
TOPICAL_OINTMENT | CUTANEOUS | Status: AC
  Filled 2020-07-25: qty 28.35

## 2020-07-25 SURGICAL SUPPLY — 25 items
BLADE CLIPPER SURG (BLADE) ×3 IMPLANT
BLADE SURG 15 STRL LF DISP TIS (BLADE) ×1 IMPLANT
BLADE SURG 15 STRL SS (BLADE) ×2
BNDG COHESIVE 1X5 TAN NS LF (GAUZE/BANDAGES/DRESSINGS) ×3 IMPLANT
BNDG CONFORM 2 STRL LF (GAUZE/BANDAGES/DRESSINGS) ×3 IMPLANT
CANISTER SUCT 1200ML W/VALVE (MISCELLANEOUS) ×3 IMPLANT
CHLORAPREP W/TINT 26 (MISCELLANEOUS) ×3 IMPLANT
COVER WAND RF STERILE (DRAPES) ×3 IMPLANT
DRAPE LAPAROTOMY 77X122 PED (DRAPES) ×3 IMPLANT
ELECT REM PT RETURN 9FT ADLT (ELECTROSURGICAL) ×3
ELECTRODE REM PT RTRN 9FT ADLT (ELECTROSURGICAL) ×1 IMPLANT
GAUZE PETROLATUM 1 X8 (GAUZE/BANDAGES/DRESSINGS) ×3 IMPLANT
GLOVE BIO SURGEON STRL SZ 6.5 (GLOVE) ×2 IMPLANT
GLOVE BIO SURGEONS STRL SZ 6.5 (GLOVE) ×1
GOWN STRL REUS W/ TWL LRG LVL3 (GOWN DISPOSABLE) ×2 IMPLANT
GOWN STRL REUS W/TWL LRG LVL3 (GOWN DISPOSABLE) ×4
KIT TURNOVER KIT A (KITS) ×3 IMPLANT
LABEL OR SOLS (LABEL) ×3 IMPLANT
NEEDLE HYPO 25X1 1.5 SAFETY (NEEDLE) ×6 IMPLANT
NS IRRIG 500ML POUR BTL (IV SOLUTION) ×3 IMPLANT
PACK BASIN MINOR (MISCELLANEOUS) ×3 IMPLANT
SOL PREP PVP 2OZ (MISCELLANEOUS) ×3
SOLUTION PREP PVP 2OZ (MISCELLANEOUS) ×1 IMPLANT
SUT CHROMIC 3 0 SH 27 (SUTURE) ×6 IMPLANT
SYR 10ML LL (SYRINGE) ×3 IMPLANT

## 2020-07-25 NOTE — Discharge Instructions (Signed)
   Circumcision Information and Post Care Instructions  After-care: After the procedure your whole penis will be swollen and look very bruised. This is a normal effect of both the injected anaesthetic and the handling it necessarily receives during the operation. These will gradually reduce over the next week or two. Underwear If you normally wear boxers you may find that they give insufficient support immediately post procedure. You may wish to consider some form of briefs which will hold your penis in position to provide support and reduce the friction The Bandage The bandage will normally be wound tightly around the penis. Leave for 48hrs and keep clean and dry.    Promoting Healing Do not apply any antiseptic cream to your penis, nor add any antiseptic to bath water. Though they do help to kill germs, most are corrosive to new skin and actually slow down healing. In the rare cases where an infection develops, see a doctor as soon as possible. Smoking can delay healing and place you at higher risk of infection. You should quit or significantly reduce the amount of cigarettes you smoke prior to and after procedure.  Pain Killers Everyone reacts differently in respect of pain. For most people circumcision will not be truly painful, but a degree of discomfort is to be expected during the first few days. If you choose to take pain killing tablets like Tylenol then follow the instructions precisely. Do not take more than the recommended maximum dose. Do not take Aspirin or any Aspirin based product since these thin the blood and have an anti-clotting action which can increase bleeding from a wound.  The Stitches Stitches need to remain in place long enough for the cut edges to knit together but not so long as to allow the skin around them to fully heal. In practice this usually means they should remain for between 1 and 2 weeks. Although the doctor will normally use soluble (or self-dissolving)  stitches and will dissolve/ fall out on their own.    Time off School or Work There is no absolute need to take time off school or work after circumcision, but you may find it very hard to concentrate on work for the first few days and so may find it useful to take a week off. A week (or even two) off work is very desirable if you do heavy lifting or if your job keeps you seated and unable to move around freely for long periods. You should naturally avoid energetic or contact sports, sexual activity, cycling and swimming until your circumcision has fully healed.

## 2020-07-25 NOTE — Transfer of Care (Signed)
Immediate Anesthesia Transfer of Care Note  Patient: Todd [name]  Procedure(s) Performed: CIRCUMCISION ADULT (N/A )  Patient Location: PACU  Anesthesia Type:General  Level of Consciousness: awake, alert  and oriented  Airway & Oxygen Therapy: Patient Spontanous Breathing and Patient connected to face mask oxygen  Post-op Assessment: Report given to RN and Post -op Vital signs reviewed and stable  Post vital signs: Reviewed and stable  Last Vitals:  Vitals Value Taken Time  BP 109/69 07/25/20 1521  Temp 36.3 C 07/25/20 1521  Pulse 78 07/25/20 1522  Resp 12 07/25/20 1522  SpO2 100 % 07/25/20 1522  Vitals shown include unvalidated device data.  Last Pain:  Vitals:   07/25/20 1132  TempSrc: Temporal  PainSc: 3          Complications: No complications documented.

## 2020-07-25 NOTE — Interval H&P Note (Signed)
History and Physical Interval Note:  07/25/2020 1:36 PM  Todd [name]  has presented today for surgery, with the diagnosis of Balanoposthitis.  The various methods of treatment have been discussed with the patient and family. After consideration of risks, benefits and other options for treatment, the patient has consented to  Procedure(s): CIRCUMCISION ADULT (N/A) as a surgical intervention.  The patient's history has been reviewed, patient examined, no change in status, stable for surgery.  I have reviewed the patient's chart and labs.  Questions were answered to the patient's satisfaction.    RRR CTAB   [name]

## 2020-07-25 NOTE — Anesthesia Preprocedure Evaluation (Signed)
Anesthesia Evaluation  Patient identified by MRN, date of birth, ID band Patient awake    Reviewed: Allergy & Precautions, H&P , NPO status , Patient's Chart, lab work & pertinent test results, reviewed documented beta blocker date and time   History of Anesthesia Complications Negative for: history of anesthetic complications  Airway Mallampati: III  TM Distance: >3 FB Neck ROM: full    Dental  (+) Dental Advidsory Given, Caps, Teeth Intact   Pulmonary neg shortness of breath, sleep apnea , neg COPD, neg recent URI, former smoker,           Cardiovascular Exercise Tolerance: Good hypertension, (-) angina(-) CAD, (-) Past MI, (-) Cardiac Stents and (-) CABG (-) dysrhythmias (-) Valvular Problems/Murmurs     Neuro/Psych PSYCHIATRIC DISORDERS Anxiety Depression negative neurological ROS     GI/Hepatic GERD  ,NAFLD   Endo/Other  diabetesMorbid obesity  Renal/GU negative Renal ROS  negative genitourinary   Musculoskeletal   Abdominal   Peds  Hematology negative hematology ROS (+)   Anesthesia Other Findings Past Medical History: No date: Anxiety 11/09/2016: Arthrosis of left acromioclavicular joint 02/15/2016: Chronic left shoulder pain No date: Depression No date: Diabetes mellitus without complication (HCC) 06/05/2016: Dislocation of right thumb 06/08/2017: Gastroparesis No date: GERD (gastroesophageal reflux disease) 05/19/2016: HTN (hypertension) No date: Hypertension 05/28/2016: MDD (major depressive disorder), recurrent episode (HCC) 02/15/2016: Neck pain, bilateral 01/31/2016: Primary osteoarthritis of right knee     Comment:  and fingers 06/05/2016: PTSD (post-traumatic stress disorder) 05/19/2016: Severe recurrent major depression without psychotic  features (HCC) No date: Sleep apnea     Comment:  unable to tolerate CPAP 05/19/2016: Substance induced mood disorder (HCC) 05/19/2016: Suicidal ideation 06/08/2017: Type  2 diabetes mellitus with complication, without long- term current use of insulin (HCC) 03/12/2017: Uncontrolled type 2 diabetes mellitus with hyperglycemia,  without long-term current use of insulin (HCC)   Reproductive/Obstetrics negative OB ROS                             Anesthesia Physical  Anesthesia Plan  ASA: III  Anesthesia Plan: General   Post-op Pain Management:    Induction: Intravenous  PONV Risk Score and Plan: 2 and Dexamethasone, Ondansetron, Midazolam, Treatment may vary due to age or medical condition and Promethazine  Airway Management Planned: LMA  Additional Equipment:   Intra-op Plan:   Post-operative Plan: Extubation in OR  Informed Consent: I have reviewed the patients History and Physical, chart, labs and discussed the procedure including the risks, benefits and alternatives for the proposed anesthesia with the patient or authorized representative who has indicated his/her understanding and acceptance.     Dental Advisory Given  Plan Discussed with: Anesthesiologist, CRNA and Surgeon  Anesthesia Plan Comments:         Anesthesia Quick Evaluation

## 2020-07-25 NOTE — Op Note (Signed)
07/25/20  Preop: Recurrent balanoposthitis  Postop: Same as above  Procedure: Circumcision  EBL: Minimal  Drains: None  Complications: None  Surgeon: [name]  Specimens: None sent  Procedure: Patient was brought to the operating room and general anesthesia was induced.  He was then prepped and draped in a sterile fashion.    -A dorsal penile and ring block were administered using 20 cc 1% lidocaine.  The surgical site was then tested and adequate anesthesia was achieved.   - Forceps were used to stretch the phimotic foreskin and expose the coronal area exposing the glans and meatus. -Two circumferential incision were made on the penile skin, one in the mid shaft and another approximately 1 cm proximal to the coronal margin using a blade. -The foreskin was then removed in a sleeve-like fashion with care taken to avoid injury to the urethra.  - Bleeding points were cauterized and adequate hemostasis was achieved - Skin margins were reapproximated with interrupted 3-0 chromic catgut sutures. -A dressing of Xeroform and gauze was applied.     Post-Procedure: -RTC in 4 weeks for wound check Michiel Cowboy)

## 2020-07-25 NOTE — Anesthesia Procedure Notes (Signed)
Procedure Name: LMA Insertion Date/Time: 07/25/2020 2:23 PM Performed by: Danelle Berry, CRNA Pre-anesthesia Checklist: Patient identified, Emergency Drugs available, Suction available, Patient being monitored and Timeout performed Patient Re-evaluated:Patient Re-evaluated prior to induction Oxygen Delivery Method: Circle system utilized and Simple face mask Preoxygenation: Pre-oxygenation with 100% oxygen Induction Type: IV induction Ventilation: Mask ventilation without difficulty LMA Size: 4.0 Number of attempts: 1 Placement Confirmation: positive ETCO2

## 2020-07-26 ENCOUNTER — Encounter: Payer: Self-pay | Admitting: Urology

## 2020-07-26 NOTE — Anesthesia Postprocedure Evaluation (Signed)
Anesthesia Post Note  Patient: Todd [name]  Procedure(s) Performed: CIRCUMCISION ADULT (N/A )  Patient location during evaluation: PACU Anesthesia Type: General Level of consciousness: awake and alert Pain management: pain level controlled Vital Signs Assessment: post-procedure vital signs reviewed and stable Respiratory status: spontaneous breathing, nonlabored ventilation, respiratory function stable and patient connected to nasal cannula oxygen Cardiovascular status: blood pressure returned to baseline and stable Postop Assessment: no apparent nausea or vomiting Anesthetic complications: no   No complications documented.   Last Vitals:  Vitals:   07/25/20 1551 07/25/20 1605  BP: (!) 139/93 131/88  Pulse: 77 80  Resp: 19 20  Temp:  (!) 36.1 C  SpO2: 99% 100%    Last Pain:  Vitals:   07/25/20 1605  TempSrc: Temporal  PainSc: 0-No pain                 Lenard Simmer

## 2020-07-29 ENCOUNTER — Telehealth: Payer: Self-pay | Admitting: Radiology

## 2020-07-29 NOTE — Telephone Encounter (Signed)
Patient reports raw area where he had a circumcision on 07/25/2020. He is concerned that a stitch came out. Otherwise it looks good. He is also having some pain in the area. Advised him that some pain is normal following surgery. Please return call regarding wound at [phone].

## 2020-07-29 NOTE — Telephone Encounter (Signed)
Spoke with patient he denies puss at site and no gaping of skin. He denies fever, chills, nausea, vomiting and minimal blood seen at site. He was reassured that some soreness and raw skin is normal post op. Also stiches are dissolvable and will fall out on their own and as long as no puss or large skin gaping is noted, it is normal. Patient verbalized understanding and states he is overall doing well, some swelling is noted and to be expected. He will keep follow up in month for post op check up

## 2020-08-05 ENCOUNTER — Ambulatory Visit (INDEPENDENT_AMBULATORY_CARE_PROVIDER_SITE_OTHER): Payer: Medicare Other | Admitting: Urology

## 2020-08-05 ENCOUNTER — Telehealth: Payer: Self-pay

## 2020-08-05 ENCOUNTER — Telehealth: Payer: Self-pay | Admitting: Urology

## 2020-08-05 ENCOUNTER — Encounter: Payer: Self-pay | Admitting: Urology

## 2020-08-05 ENCOUNTER — Other Ambulatory Visit: Payer: Self-pay

## 2020-08-05 VITALS — BP 132/77 | HR 82 | Ht 68.0 in | Wt 250.0 lb

## 2020-08-05 DIAGNOSIS — N476 Balanoposthitis: Secondary | ICD-10-CM

## 2020-08-05 NOTE — Telephone Encounter (Signed)
Patient called the triage line this morning and left a voice mail message with concerns about a "raw area" on his foreskin after a circumcision on 10/14.  He is concerned that it may not be healing.    Please call him at [phone].

## 2020-08-05 NOTE — Interval H&P Note (Signed)
History and Physical Interval Note:   3:00 PM  Todd [name]  has presented today for surgery, with the diagnosis of Balanoposthitis.  The various methods of treatment have been discussed with the patient and family. After consideration of risks, benefits and other options for treatment, the patient has consented to  Procedure(s): CIRCUMCISION ADULT (N/A) as a surgical intervention.  The patient's history has been reviewed, patient examined, no change in status, stable for surgery.  I have reviewed the patient's chart and labs.  Questions were answered to the patient's satisfaction.    RRR cTAB  [name]

## 2020-08-05 NOTE — Progress Notes (Signed)
08/05/2020 1:19 PM   Todd [name] March 13, 1966 [id number]  Referring provider: Preston Fleeting, MD [address] [place],  [place] [address]  Chief Complaint  Patient presents with   Wound Check    post op    HPI: Todd [name] is a 54 year old male who is status post circumcision who presents today for follow up.  Patient is concerned about how the healing is progressing and wanted to be seen today.     He underwent circumcision in the OR with Dr. Apolinar Junes on 07/25/2020.  Circumcision was completed without complications.  He was a "raw" area where he believes a stitch came out and would like it checked for infection.    He has not noticed any swelling, drainage from the incisions, pain, redness or issues with urination.  He states he is urinating in a straight stream now compared to prior to circumscion where he sprayed.    He is having spontaneous erections.     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  Migraines 2-3x/yr.   HTN (hypertension) 05/19/2016   Hypertension    MDD (major depressive disorder), recurrent episode (HCC) 05/28/2016   Neck pain, bilateral 02/15/2016   Primary osteoarthritis of right knee 01/31/2016   and fingers   PTSD (post-traumatic stress disorder) 06/05/2016   Severe recurrent major depression without psychotic features (HCC) 05/19/2016   Sleep apnea    unable to tolerate CPAP   Substance induced mood disorder (HCC) 05/19/2016   Suicidal ideation 05/19/2016   Type 2 diabetes mellitus with complication, without long-term current use of insulin (HCC)  06/08/2017   Uncontrolled type 2 diabetes mellitus with hyperglycemia, without long-term current use of insulin (HCC) 03/12/2017   Vertigo     Surgical History: Past Surgical History:  Procedure Laterality Date   BACK SURGERY  11/209/18 and 10/08/17   x 2   CIRCUMCISION N/A 07/25/2020   Procedure: CIRCUMCISION ADULT;  Surgeon: [name], MD;  Location: [facility] ORS;  Service: Urology;  Laterality: N/A;   COLONOSCOPY WITH PROPOFOL N/A 01/31/2018   Procedure: COLONOSCOPY WITH PROPOFOL;  Surgeon: Scot Jun, MD;  Location: [facility] ENDOSCOPY;  Service: Endoscopy;  Laterality: N/A;   ESOPHAGOGASTRODUODENOSCOPY (EGD) WITH PROPOFOL N/A 01/31/2018   Procedure: ESOPHAGOGASTRODUODENOSCOPY (EGD) WITH PROPOFOL;  Surgeon: Scot Jun, MD;  Location: [facility] ENDOSCOPY;  Service: Endoscopy;  Laterality: N/A;   LEG AMPUTATION BELOW KNEE     left   NASAL SEPTOPLASTY W/ TURBINOPLASTY Bilateral 04/17/2020   Procedure: NASAL SEPTOPLASTY WITH INFERIORTURBINATE REDUCTION;  Surgeon: [name], MD;  Location: [facility];  Service: ENT;  Laterality: Bilateral;  Diabetic - oral meds   SHOULDER SURGERY Left     Home Medications:  Allergies as of 08/05/2020      Reactions   Bactrim [sulfamethoxazole-trimethoprim] Itching, Other (See Comments)   Causes blisters   Myrbetriq [mirabegron] Other (See Comments)   Dizzy, headache   Benadryl [diphenhydramine] Anxiety, Other (See Comments)   Causes "jerking"   Flonase [fluticasone] Palpitations   Tamsulosin Rash  Medication List       Accurate as of August 05, 2020  1:19 PM. If you have any questions, ask your nurse or doctor.        STOP taking these medications   amoxicillin-clavulanate 875-125 MG tablet Commonly known as: Augmentin Stopped by: Michiel Cowboy, PA-C   oxyCODONE-acetaminophen 5-325 MG tablet Commonly known as: Percocet Stopped by: Michiel Cowboy, PA-C     TAKE these medications   amLODipine 5 MG  tablet Commonly known as: NORVASC Take 5 mg by mouth daily.   atorvastatin 40 MG tablet Commonly known as: LIPITOR Take 40 mg by mouth at bedtime.   cloNIDine 0.2 MG tablet Commonly known as: CATAPRES Take 0.2 mg by mouth at bedtime.   dicyclomine 20 MG tablet Commonly known as: BENTYL Take 1 tablet (20 mg total) by mouth every 6 (six) hours.   Farxiga 10 MG Tabs tablet Generic drug: dapagliflozin propanediol Take 10 mg by mouth daily.   hydrochlorothiazide 12.5 MG tablet Commonly known as: HYDRODIURIL Take 12.5 mg by mouth daily.   ibuprofen 200 MG tablet Commonly known as: ADVIL Take 400 mg by mouth every 6 (six) hours as needed for headache or moderate pain.   Januvia 100 MG tablet Generic drug: sitaGLIPtin Take 100 mg by mouth daily.   lactulose 10 GM/15ML solution Commonly known as: CHRONULAC Take 30 mLs (20 g total) by mouth daily as needed for mild constipation.   lisinopril 40 MG tablet Commonly known as: ZESTRIL Take 1 tablet (40 mg total) by mouth daily.   meclizine 25 MG tablet Commonly known as: ANTIVERT Take 1 tablet (25 mg total) by mouth 3 (three) times daily as needed for dizziness.   metoCLOPramide 10 MG tablet Commonly known as: REGLAN Take 10 mg by mouth 3 (three) times daily as needed for nausea or vomiting.   nystatin cream Commonly known as: MYCOSTATIN Apply 1 application topically 2 (two) times daily. What changed:   when to take this  reasons to take this   omeprazole 40 MG capsule Commonly known as: PRILOSEC Take 40 mg by mouth 2 (two) times daily.   ondansetron 4 MG tablet Commonly known as: Zofran Take 1 tablet (4 mg total) by mouth every 8 (eight) hours as needed for up to 10 doses for nausea or vomiting.   oxybutynin 15 MG 24 hr tablet Commonly known as: DITROPAN XL Take 15 mg by mouth at bedtime.   polyethylene glycol 17 g packet Commonly known as: MIRALAX / GLYCOLAX Take 17 g by mouth 2 (two) times daily.        Allergies:  Allergies  Allergen Reactions   Bactrim [Sulfamethoxazole-Trimethoprim] Itching and Other (See Comments)    Causes blisters   Myrbetriq [Mirabegron] Other (See Comments)    Dizzy, headache   Benadryl [Diphenhydramine] Anxiety and Other (See Comments)    Causes "jerking"   Flonase [Fluticasone] Palpitations   Tamsulosin Rash    Family History: Family History  Problem Relation Age of Onset   Diabetes Mother     Social History:  reports that he quit smoking about 31 years ago. His smoking use included cigarettes. He has a 11.00 pack-year smoking history. He has never used smokeless tobacco. He reports that he does not drink alcohol and does not use drugs.  ROS: Pertinent ROS in HPI  Physical Exam: BP 132/77    Pulse 82    Ht 5\' 8"  (1.727 m)    Wt 250 lb (113.4 kg)  BMI 38.01 kg/m   Constitutional:  Well nourished. Alert and oriented, No acute distress. HEENT: [place] AT, mask in place.  Trachea midline Cardiovascular: No clubbing, cyanosis, or edema. Respiratory: Normal respiratory effort, no increased work of breathing. GU: No CVA tenderness.  No bladder fullness or masses.  Patient with circumcised phallus. Urethral meatus is patent.  No penile discharge. No penile lesions or rashes.  Penile incision is clean and dry.  No erythema, crepitus, fluctuant mass or drainage is seen.  A small area on the dorsal surface that is open ( 5 mm x 5 mm) but it is clean and dry as well with good granulation tissue.   Neurologic: Grossly intact, no focal deficits, moving all 4 extremities. Psychiatric: Normal mood and affect.  Laboratory Data: Lab Results  Component Value Date   WBC 12.3 (H) 06/10/2020   HGB 16.7 06/10/2020   HCT 49.0 06/10/2020   MCV 80.9 06/10/2020   PLT 268 06/10/2020    Lab Results  Component Value Date   CREATININE 0.98 06/10/2020    Lab Results  Component Value Date   AST 35 06/10/2020   Lab Results  Component Value Date   ALT 46 (H)  06/10/2020    Urinalysis    Component Value Date/Time   COLORURINE STRAW (A) 06/09/2020 0254   APPEARANCEUR CLEAR (A) 06/09/2020 0254   APPEARANCEUR Clear 02/22/2020 1033   LABSPEC 1.008 06/09/2020 0254   PHURINE 6.0 06/09/2020 0254   GLUCOSEU >=500 (A) 06/09/2020 0254   HGBUR NEGATIVE 06/09/2020 0254   BILIRUBINUR NEGATIVE 06/09/2020 0254   BILIRUBINUR Negative 02/22/2020 1033   KETONESUR 5 (A) 06/09/2020 0254   PROTEINUR NEGATIVE 06/09/2020 0254   NITRITE NEGATIVE 06/09/2020 0254   LEUKOCYTESUR NEGATIVE 06/09/2020 0254    I have reviewed the labs.   Pertinent Imaging: N/A  Assessment & Plan:    1. Status post circumcision Healing well Patient reassured  Follow up as scheduled   Return in about 1 month (around 09/05/2020) for recheck .   These notes generated with voice recognition software. I apologize for typographical errors.  Michiel Cowboy, PA-C  [facility] [address]  [address] [place], [place] [address] [phone]

## 2020-08-05 NOTE — Telephone Encounter (Signed)
Called and spoke with patient he states that more stiches are gone and he is worried that the site is not getting enough air to heal. Some skin noted around the incision site. Denies puss or drainage, not hot to the touch but red. He was assured that this is normal in the healing process. Patient still concerned, he was added on to PA schedule today for wound check

## 2020-08-08 NOTE — Telephone Encounter (Signed)
error 

## 2020-08-26 NOTE — Progress Notes (Signed)
08/27/2020 10:24 AM   Todd [name] 10-02-1966 [id number]  Referring provider: Preston Fleeting, MD [address] [place],  [place] [address]  Chief Complaint  Patient presents with  . Follow-up    HPI: Todd [name] is a 54 year old male who is status post circumcision who presents today for follow up.  Patient is concerned about how the healing is progressing and wanted to be seen today.     He underwent circumcision in the OR with Dr. Apolinar Junes on 07/25/2020.  Circumcision was completed without complications.  The penis is well healed.  He has no complaints.   PMH: Past Medical History:  Diagnosis Date  . Anxiety   . 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 Chronic left shoulder pain 02/15/2016  . Depression   . Diabetes mellitus without complication (HCC)   . Dislocation of right thumb 06/05/2016  . Diverticulitis   . 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 Gastroparesis 06/08/2017  . GERD (gastroesophageal reflux disease)   . Headache    daily.  Migraines 2-3x/yr.  Marland Kitchen HTN (hypertension) 05/19/2016  . Hypertension   . MDD (major depressive disorder), recurrent episode (HCC) 05/28/2016  . Neck pain, bilateral 02/15/2016  . Primary osteoarthritis of right knee 01/31/2016   and fingers  . PTSD (post-traumatic stress disorder) 06/05/2016  . Severe recurrent major depression without psychotic features (HCC) 05/19/2016  . Sleep apnea    unable to tolerate CPAP  . Substance induced mood disorder (HCC) 05/19/2016  . Suicidal ideation 05/19/2016  . Type 2 diabetes mellitus with complication, without long-term current use of insulin (HCC) 06/08/2017  . Uncontrolled type 2 diabetes mellitus with hyperglycemia, without long-term current use of insulin (HCC) 03/12/2017  . Vertigo     Surgical History: Past Surgical History:  Procedure Laterality Date  . BACK SURGERY  11/209/18 and 10/08/17   x 2  . CIRCUMCISION N/A 07/25/2020   Procedure:  CIRCUMCISION ADULT;  Surgeon: [name], MD;  Location: [facility] ORS;  Service: Urology;  Laterality: N/A;  . COLONOSCOPY WITH PROPOFOL N/A 01/31/2018   Procedure: COLONOSCOPY WITH PROPOFOL;  Surgeon: Scot Jun, MD;  Location: [facility] ENDOSCOPY;  Service: Endoscopy;  Laterality: N/A;  . ESOPHAGOGASTRODUODENOSCOPY (EGD) WITH PROPOFOL N/A 01/31/2018   Procedure: ESOPHAGOGASTRODUODENOSCOPY (EGD) WITH PROPOFOL;  Surgeon: Scot Jun, MD;  Location: [facility] ENDOSCOPY;  Service: Endoscopy;  Laterality: N/A;  . LEG AMPUTATION BELOW KNEE     left  . NASAL SEPTOPLASTY W/ TURBINOPLASTY Bilateral 04/17/2020   Procedure: NASAL SEPTOPLASTY WITH INFERIORTURBINATE REDUCTION;  Surgeon: [name], MD;  Location: [facility];  Service: ENT;  Laterality: Bilateral;  Diabetic - oral meds  . SHOULDER SURGERY Left     Home Medications:  Allergies as of 08/27/2020      Reactions   Bactrim [sulfamethoxazole-trimethoprim] Itching, Other (See Comments)   Causes blisters   Myrbetriq [mirabegron] Other (See Comments)   Dizzy, headache   Benadryl [diphenhydramine] Anxiety, Other (See Comments)   Causes "jerking"   Flonase [fluticasone] Palpitations   Tamsulosin Rash      Medication List       Accurate as of August 27, 2020 10:24 AM. If you have any questions, ask your nurse or doctor.        STOP taking these medications   lactulose 10 GM/15ML solution Commonly known as: CHRONULAC Stopped by: Michiel Cowboy, PA-C     TAKE these medications   amLODipine  5 MG tablet Commonly known as: NORVASC Take 5 mg by mouth daily.   amoxicillin 500 MG capsule Commonly known as: AMOXIL Take 500 mg by mouth every 8 (eight) hours.   atorvastatin 40 MG tablet Commonly known as: LIPITOR Take 40 mg by mouth at bedtime.   cloNIDine 0.2 MG tablet Commonly known as: CATAPRES Take 0.2 mg by mouth at bedtime.   dicyclomine 20 MG tablet Commonly known as: BENTYL Take 1 tablet (20 mg total)  by mouth every 6 (six) hours.   Farxiga 10 MG Tabs tablet Generic drug: dapagliflozin propanediol Take 10 mg by mouth daily.   fluticasone 50 MCG/ACT nasal spray Commonly known as: FLONASE Place 2 sprays into both nostrils daily.   hydrochlorothiazide 12.5 MG tablet Commonly known as: HYDRODIURIL Take 12.5 mg by mouth daily.   ibuprofen 200 MG tablet Commonly known as: ADVIL Take 400 mg by mouth every 6 (six) hours as needed for headache or moderate pain.   Januvia 100 MG tablet Generic drug: sitaGLIPtin Take 100 mg by mouth daily.   lisinopril 40 MG tablet Commonly known as: ZESTRIL Take 1 tablet (40 mg total) by mouth daily.   meclizine 25 MG tablet Commonly known as: ANTIVERT Take 1 tablet (25 mg total) by mouth 3 (three) times daily as needed for dizziness.   metoCLOPramide 10 MG tablet Commonly known as: REGLAN Take 10 mg by mouth 3 (three) times daily as needed for nausea or vomiting.   nystatin cream Commonly known as: MYCOSTATIN Apply 1 application topically 2 (two) times daily. What changed:   when to take this  reasons to take this   omeprazole 40 MG capsule Commonly known as: PRILOSEC Take 40 mg by mouth 2 (two) times daily.   ondansetron 4 MG tablet Commonly known as: Zofran Take 1 tablet (4 mg total) by mouth every 8 (eight) hours as needed for up to 10 doses for nausea or vomiting.   oxybutynin 15 MG 24 hr tablet Commonly known as: DITROPAN XL Take 15 mg by mouth at bedtime.   polyethylene glycol 17 g packet Commonly known as: MIRALAX / GLYCOLAX Take 17 g by mouth 2 (two) times daily.   tamsulosin 0.4 MG Caps capsule Commonly known as: FLOMAX Take 0.4 mg by mouth daily.       Allergies:  Allergies  Allergen Reactions  . Bactrim [Sulfamethoxazole-Trimethoprim] Itching and Other (See Comments)    Causes blisters  . Myrbetriq [Mirabegron] Other (See Comments)    Dizzy, headache  . Benadryl [Diphenhydramine] Anxiety and Other (See  Comments)    Causes "jerking"  . Flonase [Fluticasone] Palpitations  . Tamsulosin Rash    Family History: Family History  Problem Relation Age of Onset  . Diabetes Mother     Social History:  reports that he quit smoking about 31 years ago. His smoking use included cigarettes. He has a 11.00 pack-year smoking history. He has never used smokeless tobacco. He reports that he does not drink alcohol and does not use drugs.  ROS: Pertinent ROS in HPI  Physical Exam: BP (!) 150/85   Pulse 84   Ht 5\' 8"  (1.727 m)   Wt 250 lb (113.4 kg)   BMI 38.01 kg/m   Constitutional:  Well nourished. Alert and oriented, No acute distress. HEENT: [place] AT, mask in place.  Trachea midline Cardiovascular: No clubbing, cyanosis, or edema. Respiratory: Normal respiratory effort, no increased work of breathing. GU: Patient with circumcised phallus. Well healed circumcision.  Urethral meatus is  patent.  No penile discharge. No penile lesions or rashes.  Neurologic: Grossly intact, no focal deficits, moving all 4 extremities. Psychiatric: Normal mood and affect.  Laboratory Data: Lab Results  Component Value Date   WBC 12.3 (H) 06/10/2020   HGB 16.7 06/10/2020   HCT 49.0 06/10/2020   MCV 80.9 06/10/2020   PLT 268 06/10/2020    Lab Results  Component Value Date   CREATININE 0.98 06/10/2020    Lab Results  Component Value Date   AST 35 06/10/2020   Lab Results  Component Value Date   ALT 46 (H) 06/10/2020    Urinalysis    Component Value Date/Time   COLORURINE STRAW (A) 06/09/2020 0254   APPEARANCEUR CLEAR (A) 06/09/2020 0254   APPEARANCEUR Clear 02/22/2020 1033   LABSPEC 1.008 06/09/2020 0254   PHURINE 6.0 06/09/2020 0254   GLUCOSEU >=500 (A) 06/09/2020 0254   HGBUR NEGATIVE 06/09/2020 0254   BILIRUBINUR NEGATIVE 06/09/2020 0254   BILIRUBINUR Negative 02/22/2020 1033   KETONESUR 5 (A) 06/09/2020 0254   PROTEINUR NEGATIVE 06/09/2020 0254   NITRITE NEGATIVE 06/09/2020 0254    LEUKOCYTESUR NEGATIVE 06/09/2020 0254    I have reviewed the labs.   Pertinent Imaging: N/A  Assessment & Plan:    1. Status post circumcision Well healed  2. BPH with LU TS RTC for UA, PSA, I PSS, SHIM, PVR and exam  Return in about 1 year (around 08/27/2021) for UA, PSA, IPSS, SHIM, PVRand exam.   These notes generated with voice recognition software. I apologize for typographical errors.  Michiel Cowboy, PA-C  [facility] [address]  [address] [place], [place] [address] [phone]

## 2020-08-27 ENCOUNTER — Ambulatory Visit (INDEPENDENT_AMBULATORY_CARE_PROVIDER_SITE_OTHER): Payer: Medicare Other | Admitting: Urology

## 2020-08-27 ENCOUNTER — Other Ambulatory Visit: Payer: Self-pay

## 2020-08-27 ENCOUNTER — Encounter: Payer: Self-pay | Admitting: Urology

## 2020-08-27 VITALS — BP 150/85 | HR 84 | Ht 68.0 in | Wt 250.0 lb

## 2020-08-27 DIAGNOSIS — N476 Balanoposthitis: Secondary | ICD-10-CM

## 2020-08-30 ENCOUNTER — Other Ambulatory Visit: Payer: Self-pay | Admitting: Family Medicine

## 2020-08-30 DIAGNOSIS — N138 Other obstructive and reflux uropathy: Secondary | ICD-10-CM

## 2020-09-10 ENCOUNTER — Other Ambulatory Visit: Payer: Medicare Other

## 2020-09-13 ENCOUNTER — Ambulatory Visit: Payer: Self-pay | Admitting: Urology

## 2020-09-23 ENCOUNTER — Other Ambulatory Visit: Payer: Self-pay | Admitting: Urology

## 2020-09-23 MED ORDER — OXYBUTYNIN CHLORIDE ER 15 MG PO TB24: 15.0000 mg | ORAL_TABLET | Freq: Every day | ORAL | 3 refills | Status: DC
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
## 2020-10-17 ENCOUNTER — Other Ambulatory Visit: Payer: Self-pay

## 2020-10-17 ENCOUNTER — Encounter: Payer: Self-pay | Admitting: Urology

## 2020-10-17 ENCOUNTER — Ambulatory Visit (INDEPENDENT_AMBULATORY_CARE_PROVIDER_SITE_OTHER): Payer: Medicare Other | Admitting: Urology

## 2020-10-17 VITALS — BP 147/83 | HR 79 | Ht 68.0 in | Wt 255.0 lb

## 2020-10-17 DIAGNOSIS — N3010 Interstitial cystitis (chronic) without hematuria: Secondary | ICD-10-CM | POA: Diagnosis not present

## 2020-10-17 DIAGNOSIS — N401 Enlarged prostate with lower urinary tract symptoms: Secondary | ICD-10-CM | POA: Diagnosis not present

## 2020-10-17 DIAGNOSIS — F5232 Male orgasmic disorder: Secondary | ICD-10-CM | POA: Diagnosis not present

## 2020-10-17 DIAGNOSIS — N138 Other obstructive and reflux uropathy: Secondary | ICD-10-CM | POA: Diagnosis not present

## 2020-10-17 LAB — BLADDER SCAN AMB NON-IMAGING: Scan Result: 43

## 2020-10-17 NOTE — Patient Instructions (Signed)
Tamsulosin capsules What is this medicine? TAMSULOSIN (tam SOO loe sin) is an alpha blocker. It is used to treat the signs and symptoms of an enlarged prostate in men. This condition is also called benign prostatic hyperplasia (BPH). This medicine may be used for other purposes; ask your health care provider or pharmacist if you have questions. COMMON BRAND NAME(S): Flomax What should I tell my health care provider before I take this medicine? They need to know if you have any of the following conditions:  advanced kidney disease  advanced liver disease  low blood pressure  prostate cancer  an unusual or allergic reaction to tamsulosin, sulfa drugs, other medicines, foods, dyes, or preservatives  pregnant or trying to get pregnant  breast-feeding How should I use this medicine? Take this medicine by mouth about 30 minutes after the same meal every day. Follow the directions on the prescription label. Swallow the capsules whole with a glass of water. Do not crush, chew, or open capsules. Do not take your medicine more often than directed. Do not stop taking your medicine unless your doctor tells you to. Talk to your pediatrician regarding the use of this medicine in children. Special care may be needed. Overdosage: If you think you have taken too much of this medicine contact a poison control center or emergency room at once. NOTE: This medicine is only for you. Do not share this medicine with others. What if I miss a dose? If you miss a dose, take it as soon as you can. If it is almost time for your next dose, take only that dose. Do not take double or extra doses. If you stop taking your medicine for several days or more, ask your doctor or health care professional what dose you should start back on. What may interact with this medicine?  cimetidine  fluoxetine  ketoconazole  medicines for erectile disfunction like sildenafil, tadalafil, vardenafil  medicines for high blood  pressure  other alpha-blockers like alfuzosin, doxazosin, phentolamine, phenoxybenzamine, prazosin, terazosin  warfarin This list may not describe all possible interactions. Give your health care provider a list of all the medicines, herbs, non-prescription drugs, or dietary supplements you use. Also tell them if you smoke, drink alcohol, or use illegal drugs. Some items may interact with your medicine. What should I watch for while using this medicine? Visit your doctor or health care professional for regular check ups. You will need lab work done before you start this medicine and regularly while you are taking it. Check your blood pressure as directed. Ask your health care professional what your blood pressure should be, and when you should contact him or her. This medicine may make you feel dizzy or lightheaded. This is more likely to happen after the first dose, after an increase in dose, or during hot weather or exercise. Drinking alcohol and taking some medicines can make this worse. Do not drive, use machinery, or do anything that needs mental alertness until you know how this medicine affects you. Do not sit or stand up quickly. If you begin to feel dizzy, sit down until you feel better. These effects can decrease once your body adjusts to the medicine. Contact your doctor or health care professional right away if you have an erection that lasts longer than 4 hours or if it becomes painful. This may be a sign of a serious problem and must be treated right away to prevent permanent damage. If you are thinking of having cataract surgery, tell your   eye surgeon that you have taken this medicine. What side effects may I notice from receiving this medicine? Side effects that you should report to your doctor or health care professional as soon as possible:  allergic reactions like skin rash or itching, hives, swelling of the lips, mouth, tongue, or throat  breathing problems  change in  vision  feeling faint or lightheaded  irregular heartbeat  prolonged or painful erection  weakness Side effects that usually do not require medical attention (report to your doctor or health care professional if they continue or are bothersome):  back pain  change in sex drive or performance  constipation, nausea or vomiting  cough  drowsy  runny or stuffy nose  trouble sleeping This list may not describe all possible side effects. Call your doctor for medical advice about side effects. You may report side effects to FDA at 1-800-FDA-1088. Where should I keep my medicine? Keep out of the reach of children. Store at room temperature between 15 and 30 degrees C (59 and 86 degrees F). Throw away any unused medicine after the expiration date. NOTE: This sheet is a summary. It may not cover all possible information. If you have questions about this medicine, talk to your doctor, pharmacist, or health care provider.  2020 Elsevier/Gold Standard (2018-03-03 12:54:06)  

## 2020-10-18 ENCOUNTER — Telehealth: Payer: Self-pay | Admitting: *Deleted

## 2020-10-18 LAB — PSA: Prostate Specific Ag, Serum: 0.4 ng/mL (ref 0.0–4.0)

## 2020-10-18 NOTE — Telephone Encounter (Signed)
Left message on voicemail.

## 2020-10-18 NOTE — Telephone Encounter (Signed)
-----   Message from Harle Battiest, PA-C sent at 10/18/2020  8:01 AM EST ----- Please let Todd [name] know that his PSA was normal and we will see him next year.

## 2021-03-01 ENCOUNTER — Other Ambulatory Visit: Payer: Self-pay | Admitting: Urology

## 2021-04-02 ENCOUNTER — Ambulatory Visit
Admission: RE | Admit: 2021-04-02 | Discharge: 2021-04-02 | Disposition: A | Discharge status: Home / Self Care | Payer: Medicare Other | Source: Ambulatory Visit | Attending: Pediatrics | Admitting: Pediatrics

## 2021-04-02 ENCOUNTER — Other Ambulatory Visit: Payer: Self-pay | Admitting: Pediatrics

## 2021-04-02 DIAGNOSIS — M25511 Pain in right shoulder: Secondary | ICD-10-CM

## 2021-04-02 DIAGNOSIS — R52 Pain, unspecified: Secondary | ICD-10-CM

## 2021-04-09 ENCOUNTER — Other Ambulatory Visit: Payer: Self-pay

## 2021-04-09 ENCOUNTER — Encounter: Payer: Medicare Other | Attending: Gastroenterology | Admitting: *Deleted

## 2021-04-09 ENCOUNTER — Encounter: Payer: Self-pay | Admitting: *Deleted

## 2021-04-09 VITALS — BP 108/72 | Ht 68.0 in | Wt 255.0 lb

## 2021-04-09 DIAGNOSIS — E119 Type 2 diabetes mellitus without complications: Secondary | ICD-10-CM

## 2021-04-09 NOTE — Progress Notes (Signed)
Diabetes Self-Management Education  Visit Type: First/Initial  Appt. Start Time: 0845 Appt. End Time: 1000  04/09/2021  [name], identified by name and date of birth, is a 55 y.o. male with a diagnosis of Diabetes: Type 2.   ASSESSMENT  Blood pressure 108/72, height _0  (1.727 m), weight 255 lb (115.7 kg). Body mass index is 38.77 kg/m.   Diabetes Self-Management Education - 04/09/21 1049       Visit Information   Visit Type First/Initial      Initial Visit   Diabetes Type Type 2    Are you currently following a meal plan? Yes    What type of meal plan do you follow? "low carb"    Are you taking your medications as prescribed? Yes   Pt thought he was supposed to stop [place] and continue Januvia. Per endo note he is to continue [place] and stop [place].   Date Diagnosed 12 years ago      Health Coping   How would you rate your overall health? Poor      Psychosocial Assessment   Patient Belief/Attitude about Diabetes Motivated to manage diabetes    Self-care barriers None    Self-management support Doctor's office;Family    Patient Concerns Nutrition/Meal planning;Glycemic Control;Weight Control;Medication;Healthy Lifestyle    Special Needs None    Preferred Learning Style Visual    Learning Readiness Change in progress    How often do you need to have someone help you when you read instructions, pamphlets, or other written materials from your doctor or pharmacy? 1 - Never      Pre-Education Assessment   Patient understands the diabetes disease and treatment process. Needs Review    Patient understands incorporating nutritional management into lifestyle. Needs Review    Patient undertands incorporating physical activity into lifestyle. Needs Review    Patient understands using medications safely. Needs Instruction    Patient understands monitoring blood glucose, interpreting and using results Needs Review    Patient understands prevention, detection, and  treatment of acute complications. Needs Review    Patient understands prevention, detection, and treatment of chronic complications. Needs Review    Patient understands how to develop strategies to address psychosocial issues. Needs Review    Patient understands how to develop strategies to promote health/change behavior. Needs Review      Complications   Last HgB A1C per patient/outside source 5.8 %   12/27/2020   How often do you check your blood sugar? 1-2 times/day    Fasting Blood glucose range (mg/dL) 70-129   Pt reports FBG's less than 130's mg/dL.   Postprandial Blood glucose range (mg/dL) 130-179   Pt reports pp's less than 160's mg/dL.   Have you had a dilated eye exam in the past 12 months? No    Have you had a dental exam in the past 12 months? Yes    Are you checking your feet? Yes    How many days per week are you checking your feet? 7      Dietary Intake   Breakfast eggs, toast; oatmeal; English muffin; occasional pancake or waffle    Lunch little mac-n-cheese; fruit (grapes, pear, apple, watermelon); salad with chicken    Dinner chicken, rice and salad (lettuce, tomatoes, croutons, olive oil); fish, [place]; bread, pasta, green beans    Snack (evening) peanut butter, almond milk    Beverage(s) water, tea with little honey, coffee      Exercise   Exercise Type ADL's  Patient Education   Previous Diabetes Education Yes (please comment)   classes here in 2020   Disease state  Explored patient's options for treatment of their diabetes    Nutrition management  Role of diet in the treatment of diabetes and the relationship between the three main macronutrients and blood glucose level;Food label reading, portion sizes and measuring food.;Reviewed blood glucose goals for pre and post meals and how to evaluate the patients' food intake on their blood glucose level.    Physical activity and exercise  Role of exercise on diabetes management, blood pressure control and cardiac  health.    Medications Reviewed patients medication for diabetes, action, purpose, timing of dose and side effects.    Monitoring Purpose and frequency of SMBG.;Taught/discussed recording of test results and interpretation of SMBG.;Identified appropriate SMBG and/or A1C goals.    Chronic complications Relationship between chronic complications and blood glucose control    Psychosocial adjustment Identified and addressed patients feelings and concerns about diabetes      Individualized Goals (developed by patient)   Reducing Risk Other (comment)   improve blood sugars, decrease medications, lose weight, lead a healthier lifestyle, become more fit     Outcomes   Expected Outcomes Demonstrated interest in learning. Expect positive outcomes    Future DMSE 4-6 wks             Individualized Plan for Diabetes Self-Management Training:   Learning Objective:  Patient will have a greater understanding of diabetes self-management. Patient education plan is to attend individual and/or group sessions per assessed needs and concerns.   Plan:   Patient Instructions  Check blood sugars 1 x day before breakfast or 2 hrs after supper every day Bring blood sugar records to the next appointment  Exercise:  Walk as tolerated  Eat 3 meals day,   1-2  snacks a day Space meals 4-6 hours apart Include 1 serving of protein when eating fruit for a snack Complete 3 Day Food Record and bring to next appt  Make an eye doctor appointment  Return for appointment on:  Wednesday May 07, 2021 at 8:15 am with [facility] (dietitian)   Expected Outcomes:  Demonstrated interest in learning. Expect positive outcomes  Education material provided:  General Meal Planning Guidelines Simple Meal Plan Plate Method (ADA) 3 Day Food Record  If problems or questions, patient to contact team via:  Johny Drilling, RN, [place] [phone]  Future DSME appointment: 4-6 wks May 07, 2021 with the dietitian

## 2021-04-09 NOTE — Patient Instructions (Signed)
Check blood sugars 1 x day before breakfast or 2 hrs after supper every day Bring blood sugar records to the next appointment  Exercise:  Walk as tolerated  Eat 3 meals day,   1-2  snacks a day Space meals 4-6 hours apart Include 1 serving of protein when eating fruit for a snack Complete 3 Day Food Record and bring to next appt  Make an eye doctor appointment  Return for appointment on:  Wednesday May 07, 2021 at 8:15 am with [facility] (dietitian)

## 2021-05-07 ENCOUNTER — Ambulatory Visit: Payer: Medicare Other | Admitting: Dietician

## 2021-05-23 ENCOUNTER — Other Ambulatory Visit: Payer: Self-pay | Admitting: Orthopedic Surgery

## 2021-05-28 NOTE — Progress Notes (Signed)
11/02/2018  3:19 PM   [name] 1966/03/14 [id number]  Referring provider: Theotis Burrow, MD [address] [place],  [place] [address]  Urological history: 1. IC -incontinence, urgency, suprapubic pressure, intermittent burning symptoms for greater than 30 years -told that he had a "thinning of the bladder" after extensive evaluation in [place] at age 55.  He states he was taken to the OR and had what sounds like a cystoscopy with fulguration and hydro distention, also a test done in the office where they put in a solution and it burned which sounds like a potassium sensitivity test and also a solution that was placed in his bladder several times which sounds like rescues solutions.  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  Failed Myrbetriq ([place]) and Toviaz (urinary retention) -admits to depression, anxiety and PTSD -managed with oxybutynin XL 15 mg daily   2. BPH with LU TS -PSA 0.4 in 10/2020  -I PSS 3/3 -PVR 0 mL   3. Ejaculatory disorder -unable to achieve ejaculation at times  Chief Complaint  Patient presents with   Urinary Frequency     HPI: Tico Crotteau is a 55 y.o. male male who presents today for bladder pain.   He has been having bladder pains for the last 6 days.  Patient denies any modifying or aggravating factors.  Patient denies any gross hematuria, dysuria or suprapubic/flank pain.  Patient denies any fevers, chills, nausea or vomiting.    He has been drinking a lot of water.    UA benign.  PVR 0 mL.   He has been very anxious regarding his upcoming shoulder surgery.  He has been eating a copious amount of Ritz crackers and has gained weight.    He is also in need of having an above the knee amputation for which is he is very upset about.    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  Migraines 2-3x/yr.   HTN (hypertension) 05/19/2016   Hypertension    MDD (major depressive disorder), recurrent episode ([place]) 05/28/2016   Neck pain, bilateral 02/15/2016   Primary osteoarthritis of right knee 01/31/2016   and fingers   PTSD (post-traumatic stress disorder) 06/05/2016   Severe recurrent major depression without psychotic features ([place]) 05/19/2016   Sleep apnea    unable to tolerate CPAP   Substance induced mood disorder ([place]) 05/19/2016   Suicidal ideation 05/19/2016   Type 2 diabetes mellitus with complication, without long-term current use of insulin ([place]) 06/08/2017   Uncontrolled type 2 diabetes mellitus with hyperglycemia, without long-term current use of insulin ([place]) 03/12/2017   Vertigo     Surgical History: Past Surgical History:  Procedure Laterality Date   BACK SURGERY  11/209/18 and 10/08/17   x 2   CIRCUMCISION N/A 07/25/2020   Procedure: CIRCUMCISION ADULT;  Surgeon: Hollice Espy, MD;  Location: [facility] ORS;  Service: Urology;  Laterality: N/A;   COLONOSCOPY WITH PROPOFOL N/A 01/31/2018   Procedure: COLONOSCOPY WITH PROPOFOL;  Surgeon: Manya Silvas, MD;  Location: [facility] ENDOSCOPY;  Service: Endoscopy;  Laterality: N/A;   ESOPHAGOGASTRODUODENOSCOPY (EGD) WITH PROPOFOL N/A 01/31/2018   Procedure: ESOPHAGOGASTRODUODENOSCOPY (EGD) WITH PROPOFOL;  Surgeon: Manya Silvas, MD;  Location: [facility] ENDOSCOPY;  Service: Endoscopy;  Laterality: N/A;   LEG AMPUTATION BELOW KNEE     left   NASAL SEPTOPLASTY W/ TURBINOPLASTY Bilateral 04/17/2020   Procedure: NASAL SEPTOPLASTY WITH INFERIORTURBINATE REDUCTION;  Surgeon: Carloyn Manner, MD;  Location: [facility];  Service: ENT;  Laterality: Bilateral;  Diabetic - oral meds   SHOULDER SURGERY  Left     Home Medications:  Allergies as of 05/29/2021       Reactions   Bactrim [sulfamethoxazole-trimethoprim] Itching, Other (See Comments)   Causes blisters   Myrbetriq [mirabegron] Other (See Comments)   Dizzy, headache   Benadryl [diphenhydramine] Anxiety, Other (See Comments)   Causes "jerking"   Flonase [fluticasone] Palpitations   Tamsulosin Rash        Medication List        Accurate as of May 29, 2021  3:19 PM. If you have any questions, ask your nurse or doctor.          cloNIDine 0.2 MG tablet Commonly known as: CATAPRES Take 0.2 mg by mouth 2 (two) times daily.   hydrochlorothiazide 25 MG tablet Commonly known as: HYDRODIURIL Take 25 mg by mouth daily.   ibuprofen 200 MG tablet Commonly known as: ADVIL Take 400 mg by mouth every 6 (six) hours as needed for headache or moderate pain.   lisinopril 40 MG tablet Commonly known as: ZESTRIL Take 1 tablet (40 mg total) by mouth daily.   metoCLOPramide 10 MG tablet Commonly known as: REGLAN Take 10 mg by mouth 3 (three) times daily as needed for nausea or vomiting.   omeprazole 40 MG capsule Commonly known as: PRILOSEC Take 40 mg by mouth 2 (two) times daily.   ondansetron 4 MG disintegrating tablet Commonly known as: ZOFRAN-ODT Take 4 mg by mouth 3 (three) times daily as needed for vomiting or nausea.   oxybutynin 15 MG 24 hr tablet Commonly known as: DITROPAN XL Take 1 tablet (15 mg total) by mouth at bedtime.        Allergies:  Allergies  Allergen Reactions   Bactrim [Sulfamethoxazole-Trimethoprim] Itching and Other (See Comments)    Causes blisters   Myrbetriq [Mirabegron] Other (See Comments)    Dizzy, headache   Benadryl [Diphenhydramine] Anxiety and Other (See Comments)    Causes "jerking"   Flonase [Fluticasone] Palpitations   Tamsulosin Rash    Family History: Family History  Problem Relation Age of Onset   Diabetes Mother     Social History:  reports that he quit  smoking about 32 years ago. His smoking use included cigarettes. He has a 11.00 pack-year smoking history. He has never used smokeless tobacco. He reports that he does not drink alcohol and does not use drugs.  ROS: For pertinent review of systems please refer to history of present illness  Physical Exam: BP 135/85   Pulse 69   Ht 5' 8" (1.727 m)   Wt 263 lb (119.3 kg)   BMI 39.99 kg/m   Constitutional:  Well nourished. Alert and oriented, No acute distress. HEENT: [place] AT, mask in place  Trachea midline Cardiovascular: No clubbing, cyanosis, or edema. Respiratory: Normal respiratory effort, no increased work of breathing. Neurologic: Grossly intact, no focal deficits, moving all 4 extremities. Psychiatric: Normal mood and affect.   Laboratory Data: Glucose 70 - 110 mg/dL 185 High    Sodium 136 -  145 mmol/L 142   Potassium 3.6 - 5.1 mmol/L 3.9   Chloride 97 - 109 mmol/L 106   Carbon Dioxide (CO2) 22.0 - 32.0 mmol/L 28.4   Calcium 8.7 - 10.3 mg/dL 9.1   Urea Nitrogen (BUN) 7 - 25 mg/dL 17   Creatinine 0.7 - 1.3 mg/dL 1.1   Glomerular Filtration Rate (eGFR), MDRD Estimate >60 mL/min/1.73sq m 69   BUN/Crea Ratio 6.0 - 20.0 15.5   Anion Gap w/K 6.0 - 16.0 11.5   Resulting Agency  [facility]  Specimen Collected: 12/27/20 07:23 Last Resulted: 12/27/20 10:17  Received From: [facility]  Result Received: 04/02/21 13:21   Hemoglobin A1C 4.2 - 5.6 % 5.8 High    Average Blood Glucose (Calc) mg/dL 120   Resulting [facility]  Narrative Performed by [facility] Normal Range:    4.2 - 5.6%  Increased Risk:  5.7 - 6.4%  Diabetes:        >= 6.5%  Glycemic Control for adults with diabetes:  <7%   Specimen Collected: 12/27/20 07:23 Last Resulted: 12/27/20 10:07  Received From: [facility]  Result Received: 04/02/21 13:21   Urinalysis Component     Latest Ref Rng & Units 05/29/2021  Specific Gravity,  UA     1.005 - 1.030 1.020  pH, UA     5.0 - 7.5 5.5  Color, UA     Yellow Yellow  Appearance Ur     Clear Clear  Leukocytes,UA     Negative Negative  Protein,UA     Negative/Trace Negative  Glucose, UA     Negative Negative  Ketones, UA     Negative Negative  RBC, UA     Negative Trace (A)  Bilirubin, UA     Negative Negative  Urobilinogen, Ur     0.2 - 1.0 mg/dL 0.2  Nitrite, UA     Negative Negative  Microscopic Examination      See below:   Component     Latest Ref Rng & Units 05/29/2021         2:45 PM  WBC, UA     0 - 5 /hpf 0-5  RBC     0 - 2 /hpf 0-2  Epithelial Cells (non renal)     0 - 10 /hpf 0-10  Bacteria, UA     None seen/Few None seen   I have reviewed the labs.  Pertinent Imaging: Results for CLEVE, PAOLILLO (MRN [id number]) as of 05/29/2021 15:16  Ref. Range 05/29/2021 15:00  Scan Result Unknown 0 ml    Assessment & Plan:    1. IC -UA benign -likely having a flair likely due to upcoming surgeries -continue oxybutynin XL 15 mg daily -urine sent for culture to rule out indolent infection  2. BPH with LUTS -stable  Return for keep follow up appointment with me in January 2023 .  Zara Council, PA-C  [facility] [address] [place], [place] [address] [phone]

## 2021-05-29 ENCOUNTER — Encounter
Admission: RE | Admit: 2021-05-29 | Discharge: 2021-05-29 | Disposition: A | Discharge status: Home / Self Care | Payer: Medicare Other | Source: Ambulatory Visit | Attending: Orthopedic Surgery | Admitting: Orthopedic Surgery

## 2021-05-29 ENCOUNTER — Encounter: Payer: Self-pay | Admitting: Urgent Care

## 2021-05-29 ENCOUNTER — Other Ambulatory Visit: Payer: Self-pay

## 2021-05-29 ENCOUNTER — Encounter: Payer: Self-pay | Admitting: Urology

## 2021-05-29 ENCOUNTER — Encounter: Payer: Self-pay | Admitting: Dietician

## 2021-05-29 ENCOUNTER — Ambulatory Visit (INDEPENDENT_AMBULATORY_CARE_PROVIDER_SITE_OTHER): Payer: Medicare Other | Admitting: Urology

## 2021-05-29 VITALS — BP 135/85 | HR 69 | Ht 68.0 in | Wt 263.0 lb

## 2021-05-29 DIAGNOSIS — N138 Other obstructive and reflux uropathy: Secondary | ICD-10-CM | POA: Diagnosis not present

## 2021-05-29 DIAGNOSIS — N3010 Interstitial cystitis (chronic) without hematuria: Secondary | ICD-10-CM | POA: Diagnosis not present

## 2021-05-29 DIAGNOSIS — N401 Enlarged prostate with lower urinary tract symptoms: Secondary | ICD-10-CM

## 2021-05-29 DIAGNOSIS — Z01818 Encounter for other preprocedural examination: Secondary | ICD-10-CM | POA: Diagnosis not present

## 2021-05-29 LAB — CBC
HCT: 43.8 % (ref 39.0–52.0)
Hemoglobin: 15.2 g/dL (ref 13.0–17.0)
MCH: 27.8 pg (ref 26.0–34.0)
MCHC: 34.7 g/dL (ref 30.0–36.0)
MCV: 80.2 fL (ref 80.0–100.0)
Platelets: 235 10*3/uL (ref 150–400)
RBC: 5.46 MIL/uL (ref 4.22–5.81)
RDW: 13 % (ref 11.5–15.5)
WBC: 7.8 10*3/uL (ref 4.0–10.5)
nRBC: 0 % (ref 0.0–0.2)

## 2021-05-29 LAB — BASIC METABOLIC PANEL
Anion gap: 10 (ref 5–15)
BUN: 15 mg/dL (ref 6–20)
CO2: 26 mmol/L (ref 22–32)
Calcium: 9.3 mg/dL (ref 8.9–10.3)
Chloride: 102 mmol/L (ref 98–111)
Creatinine, Ser: 1.09 mg/dL (ref 0.61–1.24)
GFR, Estimated: >60 mL/min (ref >60)
Glucose, Bld: 160 mg/dL — ABNORMAL HIGH (ref 70–99)
Potassium: 3.4 mmol/L — ABNORMAL LOW (ref 3.5–5.1)
Sodium: 138 mmol/L (ref 135–145)

## 2021-05-29 LAB — BLADDER SCAN AMB NON-IMAGING: Scan Result: 0

## 2021-05-29 NOTE — Patient Instructions (Addendum)
Your procedure is scheduled on: 06/03/2021 Report to the Registration Desk on the 1st floor of the [facility]. To find out your arrival time, please call [phone] between 1PM - 3PM on: 06/02/2021   REMEMBER: Instructions that are not followed completely may result in serious medical risk, up to and including death; or upon the discretion of your surgeon and anesthesiologist your surgery may need to be rescheduled.  Do not eat food after midnight the night before surgery.  No gum chewing, lozengers or hard candies.  You may however, drink CLEAR liquids up to 2 hours before you are scheduled to arrive for your surgery. Do not drink anything within 2 hours of your scheduled arrival time.  Clear liquids include: - water   Do NOT drink anything that is not on this list.  Type 1 and Type 2 diabetics should only drink water.  TAKE THESE MEDICATIONS THE MORNING OF SURGERY WITH A SIP OF WATER:  Prilosec (take one the night before and one on the morning of surgery - helps to prevent nausea after surgery.) 2. Clonidine 3. Zofran as needed     One week prior to surgery: Stop Anti-inflammatories (NSAIDS) such as Advil, Aleve, Ibuprofen, Motrin, Naproxen, Naprosyn and Aspirin based products such as Excedrin, Goodys Powder, BC Powder. Stop ANY OVER THE COUNTER supplements until after surgery. You may however, continue to take Tylenol if needed for pain up until the day of surgery.  No Alcohol for 24 hours before or after surgery.   No Smoking including e-cigarettes for 24 hours prior to surgery.  No chewable tobacco products for at least 6 hours prior to surgery.  No nicotine patches on the day of surgery.  Do not use any "recreational" drugs for at least a week prior to your surgery.  Please be advised that the combination of cocaine and anesthesia may have negative outcomes, up to and including death. If you test positive for cocaine, your surgery will be cancelled.  On the  morning of surgery brush your teeth with toothpaste and water, you may rinse your mouth with mouthwash if you wish. Do not swallow any toothpaste or mouthwash.  Do not wear jewelry.  Do not wear lotions, powders, or perfumes.   Do not shave body from the neck down 48 hours prior to surgery just in case you cut yourself which could leave a site for infection.  Also, freshly shaved skin may become irritated if using the CHG soap.  Contact lenses, hearing aids and dentures may not be worn into surgery.  Do not bring valuables to the hospital. [facility] is not responsible for any missing/lost belongings or valuables.   Use CHG Soap or wipes as directed on instruction sheet.   Notify your doctor if there is any change in your medical condition (cold, fever, infection).  Wear comfortable clothing (specific to your surgery type) to the hospital.  After surgery, you can help prevent lung complications by doing breathing exercises.  Take deep breaths and cough every 1-2 hours. Your doctor may order a device called an Incentive Spirometer to help you take deep breaths.  If you are being admitted to the hospital overnight, leave your suitcase in the car. After surgery it may be brought to your room.  If you are being discharged the day of surgery, you will not be allowed to drive home. You will need a responsible adult (18 years or older) to drive you home and stay with you that night.  If you are taking public transportation, you will need to have a responsible adult (18 years or older) with you. Please confirm with your physician that it is acceptable to use public transportation.   Please call the Pre-admissions Testing Dept. at [phone] if you have any questions about these instructions.  Surgery Visitation Policy:  Patients undergoing a surgery or procedure may have one family member or support person with them as long as that person is not COVID-19 positive or experiencing  its symptoms.  That person may remain in the waiting area during the procedure.  Inpatient Visitation:    Visiting hours are 7 a.m. to 8 p.m. Inpatients will be allowed two visitors daily. The visitors may change each day during the patient's stay. No visitors under the age of 69. Any visitor under the age of 73 must be accompanied by an adult. The visitor must pass COVID-19 screenings, use hand sanitizer when entering and exiting the patient's room and wear a mask at all times, including in the patient's room. Patients must also wear a mask when staff or their visitor are in the room. Masking is required regardless of vaccination status.

## 2021-05-29 NOTE — Progress Notes (Signed)
Have not heard back from patient to reschedule his cancelled appointment from 05/07/21, which was cancelled due to pending medical procedures. Sent notification to referring provider.

## 2021-05-30 LAB — MICROSCOPIC EXAMINATION: Bacteria, UA: NONE SEEN

## 2021-05-30 LAB — URINALYSIS, COMPLETE
Bilirubin, UA: NEGATIVE
Glucose, UA: NEGATIVE
Ketones, UA: NEGATIVE
Leukocytes,UA: NEGATIVE
Nitrite, UA: NEGATIVE
Protein,UA: NEGATIVE
Specific Gravity, UA: 1.02 (ref 1.005–1.030)
Urobilinogen, Ur: 0.2 mg/dL (ref 0.2–1.0)
pH, UA: 5.5 (ref 5.0–7.5)

## 2021-06-01 LAB — CULTURE, URINE COMPREHENSIVE

## 2021-06-03 ENCOUNTER — Ambulatory Visit: Payer: Medicare Other

## 2021-06-03 ENCOUNTER — Ambulatory Visit: Payer: Medicare Other | Admitting: Urgent Care

## 2021-06-03 ENCOUNTER — Other Ambulatory Visit: Payer: Self-pay

## 2021-06-03 ENCOUNTER — Encounter
Admission: RE | Disposition: A | Discharge status: Home / Self Care | Payer: Self-pay | Source: Home / Self Care | Attending: Orthopedic Surgery

## 2021-06-03 ENCOUNTER — Ambulatory Visit: Payer: Medicare Other | Admitting: Registered Nurse

## 2021-06-03 ENCOUNTER — Ambulatory Visit
Admission: RE | Admit: 2021-06-03 | Discharge: 2021-06-03 | Disposition: A | Discharge status: Home / Self Care | Payer: Medicare Other | Attending: Orthopedic Surgery | Admitting: Orthopedic Surgery

## 2021-06-03 ENCOUNTER — Encounter: Payer: Self-pay | Admitting: Orthopedic Surgery

## 2021-06-03 DIAGNOSIS — M75111 Incomplete rotator cuff tear or rupture of right shoulder, not specified as traumatic: Secondary | ICD-10-CM | POA: Insufficient documentation

## 2021-06-03 DIAGNOSIS — M25811 Other specified joint disorders, right shoulder: Secondary | ICD-10-CM | POA: Diagnosis not present

## 2021-06-03 DIAGNOSIS — Z87891 Personal history of nicotine dependence: Secondary | ICD-10-CM | POA: Diagnosis not present

## 2021-06-03 DIAGNOSIS — Z79899 Other long term (current) drug therapy: Secondary | ICD-10-CM | POA: Diagnosis not present

## 2021-06-03 DIAGNOSIS — Z419 Encounter for procedure for purposes other than remedying health state, unspecified: Secondary | ICD-10-CM

## 2021-06-03 DIAGNOSIS — Z7984 Long term (current) use of oral hypoglycemic drugs: Secondary | ICD-10-CM | POA: Diagnosis not present

## 2021-06-03 DIAGNOSIS — E119 Type 2 diabetes mellitus without complications: Secondary | ICD-10-CM | POA: Diagnosis not present

## 2021-06-03 LAB — GLUCOSE, CAPILLARY
Glucose-Capillary: 91 mg/dL (ref 70–99)
Glucose-Capillary: 185 mg/dL — ABNORMAL HIGH (ref 70–99)

## 2021-06-03 SURGERY — SHOULDER ARTHROSCOPY WITH SUBACROMIAL DECOMPRESSION AND DISTAL CLAVICLE EXCISION
Anesthesia: General | Laterality: Right

## 2021-06-03 MED ORDER — EPINEPHRINE PF 1 MG/ML IJ SOLN
INTRAMUSCULAR | Status: AC
  Filled 2021-06-03: qty 4

## 2021-06-03 MED ORDER — CHLORHEXIDINE GLUCONATE 0.12 % MT SOLN
OROMUCOSAL | Status: AC
  Administered 2021-06-03: 15 mL via OROMUCOSAL
  Filled 2021-06-03: qty 15

## 2021-06-03 MED ORDER — ROCURONIUM BROMIDE 100 MG/10ML IV SOLN
INTRAVENOUS | Status: DC | PRN
  Administered 2021-06-03: 70 mg via INTRAVENOUS

## 2021-06-03 MED ORDER — LIDOCAINE HCL (PF) 1 % IJ SOLN
INTRAMUSCULAR | Status: AC
  Filled 2021-06-03: qty 30

## 2021-06-03 MED ORDER — FENTANYL CITRATE (PF) 100 MCG/2ML IJ SOLN
INTRAMUSCULAR | Status: AC
  Filled 2021-06-03: qty 2

## 2021-06-03 MED ORDER — DEXAMETHASONE SODIUM PHOSPHATE 10 MG/ML IJ SOLN
INTRAMUSCULAR | Status: DC | PRN
  Administered 2021-06-03: 4 mg via INTRAVENOUS

## 2021-06-03 MED ORDER — OXYCODONE HCL 5 MG PO TABS: 5.0000 mg | ORAL_TABLET | ORAL | 0 refills | Status: DC | PRN

## 2021-06-03 MED ORDER — PROPOFOL 10 MG/ML IV BOLUS
INTRAVENOUS | Status: DC | PRN
  Administered 2021-06-03: 180 mg via INTRAVENOUS

## 2021-06-03 MED ORDER — ONDANSETRON HCL 4 MG/2ML IJ SOLN
INTRAMUSCULAR | Status: AC
  Filled 2021-06-03: qty 2

## 2021-06-03 MED ORDER — PHENYLEPHRINE HCL (PRESSORS) 10 MG/ML IV SOLN
INTRAVENOUS | Status: DC | PRN
  Administered 2021-06-03 (×3): 100 ug via INTRAVENOUS
  Administered 2021-06-03: 150 ug via INTRAVENOUS
  Administered 2021-06-03: 100 ug via INTRAVENOUS
  Administered 2021-06-03: 150 ug via INTRAVENOUS
  Administered 2021-06-03 (×2): 100 ug via INTRAVENOUS

## 2021-06-03 MED ORDER — CEFAZOLIN SODIUM-DEXTROSE 2-4 GM/100ML-% IV SOLN
INTRAVENOUS | Status: AC
  Filled 2021-06-03: qty 100

## 2021-06-03 MED ORDER — ACETAMINOPHEN 500 MG PO TABS: 1000.0000 mg | ORAL_TABLET | Freq: Three times a day (TID) | ORAL | 2 refills | Status: AC

## 2021-06-03 MED ORDER — FENTANYL CITRATE (PF) 100 MCG/2ML IJ SOLN: 25.0000 ug | INTRAMUSCULAR | Status: DC | PRN

## 2021-06-03 MED ORDER — ONDANSETRON 4 MG PO TBDP: 4.0000 mg | ORAL_TABLET | Freq: Three times a day (TID) | ORAL | 0 refills | Status: DC | PRN

## 2021-06-03 MED ORDER — ACETAMINOPHEN 10 MG/ML IV SOLN
INTRAVENOUS | Status: AC
  Filled 2021-06-03: qty 100

## 2021-06-03 MED ORDER — ONDANSETRON HCL 4 MG/2ML IJ SOLN: 4.0000 mg | Freq: Once | INTRAMUSCULAR | Status: DC | PRN

## 2021-06-03 MED ORDER — OXYCODONE HCL 5 MG PO TABS: 5.0000 mg | ORAL_TABLET | ORAL | 0 refills | Status: AC | PRN

## 2021-06-03 MED ORDER — LIDOCAINE HCL (CARDIAC) PF 100 MG/5ML IV SOSY
PREFILLED_SYRINGE | INTRAVENOUS | Status: DC | PRN
  Administered 2021-06-03: 100 mg via INTRAVENOUS

## 2021-06-03 MED ORDER — ASPIRIN EC 325 MG PO TBEC: 325.0000 mg | DELAYED_RELEASE_TABLET | Freq: Every day | ORAL | 0 refills | Status: DC

## 2021-06-03 MED ORDER — LACTATED RINGERS IV SOLN: INTRAVENOUS | Status: DC

## 2021-06-03 MED ORDER — FENTANYL CITRATE (PF) 100 MCG/2ML IJ SOLN: 50.0000 ug | INTRAMUSCULAR | Status: DC | PRN

## 2021-06-03 MED ORDER — MIDAZOLAM HCL 2 MG/2ML IJ SOLN: 1.0000 mg | INTRAMUSCULAR | Status: DC | PRN

## 2021-06-03 MED ORDER — OXYCODONE HCL 5 MG PO TABS: 5.0000 mg | ORAL_TABLET | Freq: Once | ORAL | Status: DC | PRN

## 2021-06-03 MED ORDER — EPHEDRINE SULFATE 50 MG/ML IJ SOLN
INTRAMUSCULAR | Status: DC | PRN
  Administered 2021-06-03: 7.5 mg via INTRAVENOUS

## 2021-06-03 MED ORDER — ROCURONIUM BROMIDE 10 MG/ML (PF) SYRINGE
PREFILLED_SYRINGE | INTRAVENOUS | Status: AC
  Filled 2021-06-03: qty 10

## 2021-06-03 MED ORDER — FENTANYL CITRATE (PF) 100 MCG/2ML IJ SOLN
INTRAMUSCULAR | Status: AC
  Administered 2021-06-03: 50 ug via INTRAVENOUS
  Filled 2021-06-03: qty 2

## 2021-06-03 MED ORDER — SODIUM CHLORIDE 0.9 % IV SOLN: INTRAVENOUS | Status: DC

## 2021-06-03 MED ORDER — BUPIVACAINE LIPOSOME 1.3 % IJ SUSP
INTRAMUSCULAR | Status: AC
  Filled 2021-06-03: qty 20

## 2021-06-03 MED ORDER — ACETAMINOPHEN 10 MG/ML IV SOLN: 1000.0000 mg | Freq: Once | INTRAVENOUS | Status: DC | PRN

## 2021-06-03 MED ORDER — ACETAMINOPHEN 10 MG/ML IV SOLN
INTRAVENOUS | Status: DC | PRN
  Administered 2021-06-03: 1000 mg via INTRAVENOUS

## 2021-06-03 MED ORDER — BUPIVACAINE LIPOSOME 1.3 % IJ SUSP
INTRAMUSCULAR | Status: DC | PRN
  Administered 2021-06-03: 10 mL

## 2021-06-03 MED ORDER — ASPIRIN EC 325 MG PO TBEC: 325.0000 mg | DELAYED_RELEASE_TABLET | Freq: Every day | ORAL | 0 refills | Status: AC

## 2021-06-03 MED ORDER — CEFAZOLIN SODIUM-DEXTROSE 2-4 GM/100ML-% IV SOLN
2.0000 g | INTRAVENOUS | Status: AC
  Administered 2021-06-03: 2 g via INTRAVENOUS

## 2021-06-03 MED ORDER — ONDANSETRON HCL 4 MG/2ML IJ SOLN
INTRAMUSCULAR | Status: DC | PRN
  Administered 2021-06-03: 4 mg via INTRAVENOUS

## 2021-06-03 MED ORDER — FENTANYL CITRATE (PF) 100 MCG/2ML IJ SOLN
INTRAMUSCULAR | Status: DC | PRN
  Administered 2021-06-03: 100 ug via INTRAVENOUS

## 2021-06-03 MED ORDER — DEXAMETHASONE SODIUM PHOSPHATE 10 MG/ML IJ SOLN
INTRAMUSCULAR | Status: AC
  Filled 2021-06-03: qty 1

## 2021-06-03 MED ORDER — EPHEDRINE 5 MG/ML INJ
INTRAVENOUS | Status: AC
  Filled 2021-06-03: qty 5

## 2021-06-03 MED ORDER — ACETAMINOPHEN 500 MG PO TABS: 1000.0000 mg | ORAL_TABLET | Freq: Three times a day (TID) | ORAL | 2 refills | Status: DC

## 2021-06-03 MED ORDER — MIDAZOLAM HCL 2 MG/2ML IJ SOLN
INTRAMUSCULAR | Status: AC
  Filled 2021-06-03: qty 2

## 2021-06-03 MED ORDER — ORAL CARE MOUTH RINSE: 15.0000 mL | Freq: Once | OROMUCOSAL | Status: AC

## 2021-06-03 MED ORDER — CHLORHEXIDINE GLUCONATE 0.12 % MT SOLN: 15.0000 mL | Freq: Once | OROMUCOSAL | Status: AC

## 2021-06-03 MED ORDER — MIDAZOLAM HCL 2 MG/2ML IJ SOLN
INTRAMUSCULAR | Status: AC
  Administered 2021-06-03: 1 mg via INTRAVENOUS
  Filled 2021-06-03: qty 2

## 2021-06-03 MED ORDER — BUPIVACAINE HCL (PF) 0.5 % IJ SOLN
INTRAMUSCULAR | Status: DC | PRN
  Administered 2021-06-03: 10 mL via PERINEURAL

## 2021-06-03 MED ORDER — BUPIVACAINE-EPINEPHRINE (PF) 0.5% -1:200000 IJ SOLN
INTRAMUSCULAR | Status: AC
  Filled 2021-06-03: qty 30

## 2021-06-03 MED ORDER — OXYCODONE HCL 5 MG/5ML PO SOLN: 5.0000 mg | Freq: Once | ORAL | Status: DC | PRN

## 2021-06-03 MED ORDER — LACTATED RINGERS IV SOLN
INTRAVENOUS | Status: DC | PRN
  Administered 2021-06-03: 4 mL

## 2021-06-03 MED ORDER — BUPIVACAINE HCL (PF) 0.5 % IJ SOLN
INTRAMUSCULAR | Status: AC
  Filled 2021-06-03: qty 10

## 2021-06-03 MED ORDER — MIDAZOLAM HCL 2 MG/2ML IJ SOLN
INTRAMUSCULAR | Status: DC | PRN
  Administered 2021-06-03: 2 mg via INTRAVENOUS

## 2021-06-03 SURGICAL SUPPLY — 80 items
ADAPTER IRRIG TUBE 2 SPIKE SOL (ADAPTER) ×4 IMPLANT
ANCHOR BONE REGENETEN (Anchor) ×2 IMPLANT
ANCHOR TENDON REGENETEN (Staple) ×2 IMPLANT
BLADE SHAVER 4.5X7 STR FR (MISCELLANEOUS) ×2 IMPLANT
BRUSH SCRUB EZ  4% CHG (MISCELLANEOUS) ×1
BRUSH SCRUB EZ 4% CHG (MISCELLANEOUS) ×1 IMPLANT
BUR BR 5.5 WIDE MOUTH (BURR) IMPLANT
BUR RADIUS 5.5 (BURR) ×2 IMPLANT
CANNULA PART THRD DISP 5.75X7 (CANNULA) ×2 IMPLANT
CANNULA PARTIAL THREAD 2X7 (CANNULA) ×2 IMPLANT
CANNULA TWIST IN 8.25X9CM (CANNULA) IMPLANT
CHLORAPREP W/TINT 26 (MISCELLANEOUS) ×2 IMPLANT
COOLER POLAR GLACIER W/PUMP (MISCELLANEOUS) ×2 IMPLANT
DEVICE SUCT BLK HOLE OR FLOOR (MISCELLANEOUS) IMPLANT
DRAPE 3/4 80X56 (DRAPES) ×2 IMPLANT
DRAPE IMP U-DRAPE 54X76 (DRAPES) ×4 IMPLANT
DRAPE INCISE IOBAN 66X45 STRL (DRAPES) ×2 IMPLANT
DRAPE ORTHO SPLIT 77X108 STRL (DRAPES) ×2
DRAPE STERI 35X30 U-POUCH (DRAPES) ×2 IMPLANT
DRAPE SURG ORHT 6 SPLT 77X108 (DRAPES) ×2 IMPLANT
DRAPE U-SHAPE 47X51 STRL (DRAPES) ×4 IMPLANT
DRSG TEGADERM 4X4.75 (GAUZE/BANDAGES/DRESSINGS) ×4 IMPLANT
ELECT REM PT RETURN 9FT ADLT (ELECTROSURGICAL)
ELECTRODE REM PT RTRN 9FT ADLT (ELECTROSURGICAL) IMPLANT
GAUZE SPONGE 4X4 12PLY STRL (GAUZE/BANDAGES/DRESSINGS) ×2 IMPLANT
GAUZE XEROFORM 1X8 LF (GAUZE/BANDAGES/DRESSINGS) ×2 IMPLANT
GLOVE SRG 8 PF TXTR STRL LF DI (GLOVE) ×2 IMPLANT
GLOVE SURG ENC MOIS LTX SZ7.5 (GLOVE) ×2 IMPLANT
GLOVE SURG ORTHO LTX SZ8 (GLOVE) ×2 IMPLANT
GLOVE SURG SYN 7.5  E (GLOVE) ×1
GLOVE SURG SYN 7.5 E (GLOVE) ×1 IMPLANT
GLOVE SURG UNDER POLY LF SZ8 (GLOVE) ×2
GOWN STRL REUS W/ TWL LRG LVL3 (GOWN DISPOSABLE) ×2 IMPLANT
GOWN STRL REUS W/TWL LRG LVL3 (GOWN DISPOSABLE) ×2
GOWN STRL REUS W/TWL LRG LVL4 (GOWN DISPOSABLE) ×2 IMPLANT
IMPL REGENETEN MEDIUM (Shoulder) ×1 IMPLANT
IMPLANT REGENETEN MEDIUM (Shoulder) ×2 IMPLANT
IV LACTATED RINGER IRRG 3000ML (IV SOLUTION) ×8
IV LR IRRIG 3000ML ARTHROMATIC (IV SOLUTION) ×8 IMPLANT
KIT STABILIZATION SHOULDER (MISCELLANEOUS) ×2 IMPLANT
KIT SUTURETAK 3.0 INSERT PERC (KITS) ×2 IMPLANT
KIT TURNOVER KIT A (KITS) ×2 IMPLANT
MANIFOLD NEPTUNE II (INSTRUMENTS) ×4 IMPLANT
MASK FACE SPIDER DISP (MASK) ×2 IMPLANT
MAT ABSORB  FLUID 56X50 GRAY (MISCELLANEOUS) ×2
MAT ABSORB FLUID 56X50 GRAY (MISCELLANEOUS) ×2 IMPLANT
NDL SAFETY ECLIPSE 18X1.5 (NEEDLE) ×1 IMPLANT
NEEDLE HYPO 18GX1.5 SHARP (NEEDLE) ×1
NEEDLE HYPO 22GX1.5 SAFETY (NEEDLE) ×2 IMPLANT
NS IRRIG 500ML POUR BTL (IV SOLUTION) ×2 IMPLANT
PACK ARTHROSCOPY SHOULDER (MISCELLANEOUS) ×2 IMPLANT
PAD ABD DERMACEA PRESS 5X9 (GAUZE/BANDAGES/DRESSINGS) ×2 IMPLANT
PAD ARMBOARD 7.5X6 YLW CONV (MISCELLANEOUS) ×2 IMPLANT
PAD WRAPON POLAR SHDR XLG (MISCELLANEOUS) ×1 IMPLANT
PASSER SUT FIRSTPASS SELF (INSTRUMENTS) ×2 IMPLANT
PENCIL SMOKE EVACUATOR (MISCELLANEOUS) ×2 IMPLANT
SHAVER BLADE BONE CUTTER  5.5 (BLADE)
SHAVER BLADE BONE CUTTER 5.5 (BLADE) IMPLANT
SLING ULTRA II M (MISCELLANEOUS) IMPLANT
SPONGE T-LAP 18X18 ~~LOC~~+RFID (SPONGE) ×2 IMPLANT
STRAP SAFETY 5IN WIDE (MISCELLANEOUS) ×2 IMPLANT
SUT ETHILON 3-0 FS-10 30 BLK (SUTURE) ×2
SUT LASSO 90 DEG SD STR (SUTURE) ×2 IMPLANT
SUT MNCRL 4-0 (SUTURE) ×1
SUT MNCRL 4-0 27XMFL (SUTURE) ×1
SUT PDS AB 0 CT1 27 (SUTURE) ×2 IMPLANT
SUT VIC AB 0 CT1 36 (SUTURE) ×2 IMPLANT
SUT VIC AB 2-0 CT2 27 (SUTURE) ×2 IMPLANT
SUTURE EHLN 3-0 FS-10 30 BLK (SUTURE) ×1 IMPLANT
SUTURE MNCRL 4-0 27XMF (SUTURE) ×1 IMPLANT
SYR 10ML LL (SYRINGE) ×2 IMPLANT
SYSTEM IMPL TENODESIS LNT 2.9 (Orthopedic Implant) ×2 IMPLANT
TAPE CLOTH 3X10 WHT NS LF (GAUZE/BANDAGES/DRESSINGS) ×2 IMPLANT
TUBING CONNECTING 10 (TUBING) ×2 IMPLANT
TUBING INFLOW SET DBFLO PUMP (TUBING) ×2 IMPLANT
TUBING OUTFLOW SET DBLFO PUMP (TUBING) ×2 IMPLANT
WAND WEREWOLF FLOW 90D (MISCELLANEOUS) IMPLANT
WATER STERILE IRR 500ML POUR (IV SOLUTION) ×2 IMPLANT
WRAP SHOULDER HOT/COLD PACK (SOFTGOODS) IMPLANT
WRAPON POLAR PAD SHDR XLG (MISCELLANEOUS) ×2

## 2021-06-03 NOTE — Discharge Instructions (Addendum)
Post-Op Instructions - Regeneten Patch  1. Bracing: You will wear a shoulder immobilizer or sling for 1 week.   2. Driving: No driving for at least 2 weeks post-op.   3. Activity: Progress to motion as tolerated, moving from passive to active-assisted to active motion. For the first 4 weeks, forward flexion is limited to 100. External rotation with the arm by the side is allowed, but abduction-external rotation is not allowed for the first 6 weeks. After 6 weeks, no restrictions on motion or arm use. Return to normal activities normally takes 4-6 months on average. If rehab goes very well, may be able to do most activities at 4 months, except overhead or contact sports.  4. Physical Therapy: Begins 3-7 days after surgery. If you do not know the time and date of your physical therapy appointment, please call our offices at [phone].   5. Medications:  - You will be provided a prescription for narcotic pain medicine. After surgery, take 1-2 narcotic tablets every 4 hours if needed for severe pain.  - A prescription for anti-nausea medication will be provided in case the narcotic medicine causes nausea - take 1 tablet every 6 hours only if nauseated.   - Take tylenol 1000 mg (2 Extra Strength tablets or 3 regular strength) every 8 hours for pain.  May decrease or stop tylenol 5 days after surgery if you are having minimal pain. - Take ASA 325mg /day x 2 weeks to help prevent DVTs/PEs (blood clots).  - DO NOT take ANY nonsteroidal anti-inflammatory pain medications (Advil, Motrin, Ibuprofen, Aleve, Naproxen, or Naprosyn). These medicines can inhibit healing of your shoulder repair.    If you are taking prescription medication for anxiety, depression, insomnia, muscle spasm, chronic pain, or for attention deficit disorder, you are advised that you are at a higher risk of adverse effects with use of narcotics post-op, including narcotic addiction/dependence, depressed breathing, death. If you use  non-prescribed substances: alcohol, marijuana, cocaine, heroin, methamphetamines, etc., you are at a higher risk of adverse effects with use of narcotics post-op, including narcotic addiction/dependence, depressed breathing, death. You are advised that taking > 50 morphine milligram equivalents (MME) of narcotic pain medication per day results in twice the risk of overdose or death. For your prescription provided: oxycodone 5 mg - taking more than 6 tablets per day would result in > 50 morphine milligram equivalents (MME) of narcotic pain medication. Be advised that we will prescribe narcotics short-term, for acute post-operative pain only - 3 weeks for major operations such as shoulder repair/reconstruction surgeries.    6. Post-Op Appointment:  Your first post-op appointment will be 10-14 days post-op.  7. Work or School: For most, but not all procedures, we advise staying out of work or school for at least 1 to 2 weeks in order to recover from the stress of surgery and to allow time for healing.   If you need a work or school note this can be provided.   8. Smoking: If you are a smoker, you need to refrain from smoking in the postoperative period. The nicotine in cigarettes will inhibit healing of your shoulder repair and decrease the chance of successful repair. Similarly, nicotine containing products (gum, patches) should be avoided.   Post-operative Brace: Apply and remove the brace you received as you were instructed to at the time of fitting and as described in detail as the brace's instructions for use indicate.  Wear the brace for the period of time prescribed by your physician.  The brace can be cleaned with soap and water and allowed to air dry only.  Should the brace result in increased pain, decreased feeling (numbness/tingling), increased swelling or an overall worsening of your medical condition, please contact your doctor immediately.  If an emergency situation occurs as a result of  wearing the brace after normal business hours, please dial 911 and seek immediate medical attention.  Let your doctor know if you have any further questions about the brace issued to you. Refer to the shoulder sling instructions for use if you have any questions regarding the correct fit of your shoulder sling.  [facility] Customer Care for Troubleshooting: [phone]  Video that illustrates how to properly use a shoulder sling: "Instructions for Proper Use of an Orthopaedic Sling" http://bass.com/     Interscalene Nerve Block with Exparel   For your surgery you have received an Interscalene Nerve Block with Exparel. Nerve Blocks affect many types of nerves, including nerves that control movement, pain and normal sensation.  You may experience feelings such as numbness, tingling, heaviness, weakness or the inability to move your arm or the feeling or sensation that your arm has "fallen asleep". A nerve block with Exparel can last up to 5 days.  Usually the weakness wears off first.  The tingling and heaviness usually wear off next.  Finally you may start to notice pain.  Keep in mind that this may occur in any order.  Once a nerve block starts to wear off it is usually completely gone within 60 minutes. ISNB may cause mild shortness of breath, a hoarse voice, blurry vision, unequal pupils, or drooping of the face on the same side as the nerve block.  These symptoms will usually resolve with the numbness.  Very rarely the procedure itself can cause mild seizures. If needed, your surgeon will give you a prescription for pain medication.  It will take about 60 minutes for the oral pain medication to become fully effective.  So, it is recommended that you start taking this medication before the nerve block first begins to wear off, or when you first begin to feel discomfort. Take your pain medication only as prescribed.  Pain medication can cause sedation and decrease your breathing  if you take more than you need for the level of pain that you have. Nausea is a common side effect of many pain medications.  You may want to eat something before taking your pain medicine to prevent nausea. After an Interscalene nerve block, you cannot feel pain, pressure or extremes in temperature in the effected arm.  Because your arm is numb it is at an increased risk for injury.  To decrease the possibility of injury, please practice the following:  While you are awake change the position of your arm frequently to prevent too much pressure on any one area for prolonged periods of time.  If you have a cast or tight dressing, check the color or your fingers every couple of hours.  Call your surgeon with the appearance of any discoloration (white or blue). If you are given a sling to wear before you go home, please wear it  at all times until the block has completely worn off.  Do not get up at night without your sling. Please contact [facility] or your surgeon if you do not begin to regain sensation after 7 days from the surgery.  Anesthesia may be contacted by calling the Same Day Surgery Department, Mon. through Fri., 6 am to 4  pm at [phone].   If you experience any other problems or concerns, please contact your surgeon's office. If you experience severe or prolonged shortness of breath go to the nearest emergency department.   AMBULATORY SURGERY  DISCHARGE INSTRUCTIONS   The drugs that you were given will stay in your system until tomorrow so for the next 24 hours you should not:  Drive an automobile Make any legal decisions Drink any alcoholic beverage   You may resume regular meals tomorrow.  Today it is better to start with liquids and gradually work up to solid foods.  You may eat anything you prefer, but it is better to start with liquids, then soup and crackers, and gradually work up to solid foods.   Please notify your doctor immediately if you have any unusual  bleeding, trouble breathing, redness and pain at the surgery site, drainage, fever, or pain not relieved by medication.   Additional Instructions: Please contact your physician with any problems or Same Day Surgery at [phone], Monday through Friday 6 am to 4 pm, or [place] at [facility] number at [phone].

## 2021-06-03 NOTE — Anesthesia Procedure Notes (Signed)
Anesthesia Regional Block: Interscalene brachial plexus block   Pre-Anesthetic Checklist: , timeout performed,  Correct Patient, Correct Site, Correct Laterality,  Correct Procedure, Correct Position, site marked,  Risks and benefits discussed,  Surgical consent,  Pre-op evaluation,  At surgeon's request and post-op pain management  Laterality: Right  Prep: chloraprep       Needles:  Injection technique: Single-shot  Needle Type: Echogenic Needle     Needle Length: 4cm  Needle Gauge: 25     Additional Needles:   Narrative:  Start time: 06/03/2021 12:10 PM End time: 06/03/2021 12:15 PM Injection made incrementally with aspirations every 5 mL.  Performed by: Personally  Anesthesiologist: Corinda Gubler, MD  Additional Notes: Patient's chart reviewed and they were deemed appropriate candidate for procedure, at surgeon's request. Patient educated about risks, benefits, and alternatives of the block including but not limited to: temporary or permanent nerve damage, bleeding, infection, damage to surround tissues, pneumothorax, hemidiaphragmatic paralysis, unilateral Horner's syndrome, block failure, local anesthetic toxicity. Patient expressed understanding. A formal time-out was conducted consistent with institution rules.  Monitors were applied, and minimal sedation used (see nursing record). The site was prepped with skin prep and allowed to dry, and sterile gloves were used. A high frequency linear ultrasound probe with probe cover was utilized throughout. C5-7 nerve roots located and appeared anatomically normal, local anesthetic injected around them, and echogenic block needle trajectory was monitored throughout. Aspiration performed every 20ml. Lung and blood vessels were avoided. All injections were performed without resistance and free of blood and paresthesias. The patient tolerated the procedure well.  Injectate: 82ml exparel + 34ml 0.5% bupivacaine

## 2021-06-03 NOTE — Anesthesia Procedure Notes (Addendum)
Procedure Name: Intubation Date/Time: 06/03/2021 1:06 PM Performed by: Lynden Oxford, CRNA Pre-anesthesia Checklist: Patient identified, Emergency Drugs available, Suction available and Patient being monitored Patient Re-evaluated:Patient Re-evaluated prior to induction Oxygen Delivery Method: Circle system utilized Preoxygenation: Pre-oxygenation with 100% oxygen Induction Type: IV induction Ventilation: Mask ventilation without difficulty Laryngoscope Size: McGraph and 4 Grade View: Grade I Tube type: Oral Tube size: 7.5 mm Number of attempts: 1 Airway Equipment and Method: Stylet and Video-laryngoscopy Placement Confirmation: ETT inserted through vocal cords under direct vision, positive ETCO2 and breath sounds checked- equal and bilateral Secured at: 22 cm Tube secured with: Tape Dental Injury: Teeth and Oropharynx as per pre-operative assessment  Comments: Patient noted to have redness in right eye prior to anesthetic induction

## 2021-06-03 NOTE — H&P (Signed)
Paper H&P to be scanned into permanent record. H&P reviewed. No significant changes noted.  

## 2021-06-03 NOTE — Op Note (Signed)
SURGERY DATE: 06/03/2021   PRE-OP DIAGNOSIS:  1. Right partial thickness rotator cuff tear 2. Right biceps tendinopathy 3. Right subacromial impingement 4. Right acromioclavicular joint impingement  POST-OP DIAGNOSIS: 1. Right partial thickness rotator cuff tear 2. Right biceps tendinopathy 3. Right subacromial impingement 4. Right acromioclavicular joint impingement  PROCEDURES:  1. Right arthroscopic Regeneten patch application (rotator cuff repair) 2. Right arthroscopic biceps tenodesis 3. Right extensive debridement of shoulder (glenohumeral and subacromial spaces) 4.  Right arthroscopic distal clavicle excision 5.  Right arthroscopic subacromial decompression  SURGEON: Rosealee Albee, MD  ASSISTANT: none  ANESTHESIA: Gen with interscalene block w/Exparel  ESTIMATED BLOOD LOSS: 5cc  DRAINS:  none  TOTAL IV FLUIDS: per anesthesia   SPECIMENS: none  IMPLANTS:  - [employer] Regeneten patch with associated tendon and bone staples - Arthrex 2.32mm PushLock anchor (arthroscopic biceps tenodesis)  OPERATIVE FINDINGS:  Examination under anesthesia: A careful examination under anesthesia was performed.  Passive range of motion was: FF: 150; ER at side: 45; ER in abduction: 85; IR in abduction: 45.  Anterior load shift: NT.  Posterior load shift: NT.  Sulcus in neutral: NT.  Sulcus in ER: NT.    Intra-operative findings: A thorough arthroscopic examination of the shoulder was performed.  The findings are: 1. Biceps tendon: Tendinopathy slight split thickness tearing proximally 2. Superior labrum: Type 2 SLAP tear 3. Posterior labrum and capsule: Degenerative labrum 4. Inferior capsule and inferior recess: normal 5. Glenoid cartilage surface: Normal 6. Supraspinatus attachment: partial thickness tearing of anterior supraspinatus involving approximately 40% of the articular surface.  7. Posterior rotator cuff attachment: normal  8. Humeral head articular cartilage:  normal 9. Rotator interval: Synovitic 10: Subscapularis tendon: Normal 11. Anterior labrum: degenerative tearing 12. IGHL: normal  OPERATIVE REPORT:   Indications for procedure: Todd [name] is a 55 y.o. male who underwent distal clavicle excision in [place] 5 years ago.  He had good relief initially after the surgery, but over the past year, he has had increased catching/clicking symptoms in the region of the [facility] joint with reaching across his body.  He underwent ultrasound evaluation and on dynamic exam, and was noted to be impingement of the distal clavicle and acromion.  Corticosteroid injection to the [facility] joint gave him good relief of symptoms for a short duration before mechanical symptoms and pain returned.  MRI was also suggestive of partial-thickness rotator cuff tear.  Clinical exam is also concerning for impingement symptoms.  After discussion of risks, benefits, and alternatives to surgery, the patient elected to proceed with above mentioned procedures. The patient understands that use of the Regeneten patch is relatively new and long-term data is unknown.  Procedure in detail:  I identified Todd [name] in the pre-operative holding area.  I marked the operative shoulder with my initials. I reviewed the risks and benefits of the proposed surgical intervention, and the patient wished to proceed.  Anesthesia was then performed with an interscalene block with [place]par[place].  The patient was transferred to the operative suite and placed in the beach chair position.    SCDs were placed on the lower extremities. Appropriate IV antibiotics were administered. The operative upper extremity was then prepped and draped in standard fashion. A time out was performed confirming the correct extremity, correct patient and correct procedure.   I then created a standard posterior portal with an 11 blade. The glenohumeral joint was easily entered with a blunt trochar and the arthroscope introduced. The findings of  diagnostic  arthroscopy are described above. I debrided the degenerative anterior labrum, superior labrum, and posterior labrum.  I then debrided and coagulated the inflamed synovium to obtain hemostasis and reduce the risk of post-operative swelling using an Arthrocare radiofrequency device.    I then turned my attention to the arthroscopic biceps tenodesis.  I used the loop n tack technique to pass a fiber tape through the biceps in a locked fashion adjacent to the biceps anchor.  The biceps tendon was cut at its attachment on the superior labrum.  A hole for a 2.9 mm Arthrex PushLock was drilled in the bicipital groove just superior to the subscapularis tendon insertion.  The fiber tape was loaded onto the push lock anchor and impacted into place into the previously drilled hole in the bicipital groove.  This appropriately secured the biceps into the bicipital groove and took it off of tension.  An oscillating shaver was then used to debride the frayed portion of the anterior aspect of the supraspinatus in the area of the partial-thickness tear on the articular side.  A spinal needle was placed in the central portion of the partial thickness rotator cuff tear and an 0-PDS suture was passed through the needle and retrieved out of the anterior portal to mark this region.  Next, the arthroscope was then introduced into the subacromial space. A direct lateral portal was created with an 11-blade after spinal needle localization. An extensive subacromial bursectomy was performed using a combination of the shaver and Arthrocare wand. The entire acromial undersurface was exposed and the CA ligament was subperiosteally elevated to expose the prominent anterior acromial hook. A burr was used to create a flat anterior and lateral aspect of the acromion, converting it from a Type 2 to a Type 1 acromion. Care was made to keep the deltoid fascia intact.  Then, I exposed the acromioclavicular joint using a combination of  shaver and arthrocare wand.  There was significant soft tissue within the space of the [facility] joint in between the distal clavicle.  This was removed using combination of an oscillating shaver and ArthroCare wand.  A probe was used to measure the distance between the distal clavicle and acromion.  Posteriorly, there was approximately 22 mm of space. Anteriorly, there was ~38mm. Dynamic exam was performed with cross body adduction maneuver. This significantly narrowed the space between distal clavicle and acromion anteriorly.  The anterior portion of the distal clavicle was excised using a burr such that there was no longer any concern for impingement on dynamic exam.  Involved resection of an additional 6 mm of the anterior aspect of the distal clavicle. Care was taken to preserve the superior and posterior capsule.   Next, a switching stick was used to probe the bursal side of the rotator cuff.  It was noted to be thin in the anterior aspect of the supraspinatus insertion on the greater tuberosity laterally.  This was consistent with a region of partial-thickness tearing on the articular side.  There was no full-thickness tear.  Given this, we decided to proceed with Regeneten patch placement. The Regeneten patch delivery gun was placed appropriately and the patch was delivered over the supraspinatus tendon. Tendon staples were placed medially, anteriorly, posteriorly, and posterolaterally. After making an appropriate stab incision for portal placement. One bone staple was then placed laterally and anteriorly. The patch was then probed to confirm appropriate stability.   Arthroscopic fluid was evacuated from the joint.  The portals were closed with 3-0 Nylon. Xeroform was  applied to the portals. A sterile dressing was applied, followed by a Polar Care sleeve and a SlingShot shoulder immobilizer/sling. The patient awoke from anesthesia without difficulty and was transferred to the PACU in stable condition.    COMPLICATIONS: none  DISPOSITION: plan for discharge home after recovery in PACU  POSTOPERATIVE PLAN: Remain in sling (except hygiene and elbow/wrist/hand RoM exercises as instructed by PT) x 1 week and NWB for this time. Wean from sling as tolerated after 1 week.  PT to begin 3-7 days after surgery. Use Regeneten Patch rehab protocol: For the first 4 weeks, forward flexion is limited to 100. External rotation with the arm by the side is allowed, but abduction-external rotation is not allowed for the first 6 weeks.  Avoid cross body adduction x8 weeks.  No restrictions on motion or arm use after this time.

## 2021-06-03 NOTE — Progress Notes (Signed)
Visual demonstration of sling and polar care given to patient and spouse. Both verbalizes understanding of instructions.

## 2021-06-03 NOTE — Anesthesia Preprocedure Evaluation (Signed)
Anesthesia Evaluation  Patient identified by MRN, date of birth, ID band Patient awake    Reviewed: Allergy & Precautions, NPO status , Patient's Chart, lab work & pertinent test results  History of Anesthesia Complications Negative for: history of anesthetic complications  Airway Mallampati: II  TM Distance: >3 FB Neck ROM: Full    Dental no notable dental hx. 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  Neuromuscular disease    GI/Hepatic GERD  Medicated,(+)     (-) substance abuse  ,   Endo/Other  diabetes, Well Controlled  Renal/GU negative Renal ROS     Musculoskeletal  (+) Arthritis ,   Abdominal (+) + obese,   Peds  Hematology   Anesthesia Other Findings Past Medical History: No date: Anxiety 11/09/2016: Arthrosis of left acromioclavicular joint 02/15/2016: Chronic left shoulder pain No date: Depression No date: Diabetes mellitus without complication (HCC) 06/05/2016: Dislocation of right thumb No date: Diverticulitis No date: Employs prosthetic leg     Comment:  Left - below the knee 06/08/2017: Gastroparesis No date: GERD (gastroesophageal reflux disease) No date: Headache     Comment:  daily.  Migraines 2-3x/yr. 05/19/2016: HTN (hypertension) No date: Hypertension 05/28/2016: MDD (major depressive disorder), recurrent episode (HCC) 02/15/2016: Neck pain, bilateral 01/31/2016: Primary osteoarthritis of right knee     Comment:  and fingers 06/05/2016: PTSD (post-traumatic stress disorder) 05/19/2016: Severe recurrent major  depression without psychotic  features (HCC) No date: Sleep apnea     Comment:  unable to tolerate CPAP 05/19/2016: Substance induced mood disorder (HCC) 05/19/2016: Suicidal ideation 06/08/2017: Type 2 diabetes mellitus with complication, without long- term current use of insulin (HCC) 03/12/2017: Uncontrolled type 2 diabetes mellitus with hyperglycemia,  without long-term current use of insulin (HCC) No date: Vertigo  Reproductive/Obstetrics                             Anesthesia Physical Anesthesia Plan  ASA: 3  Anesthesia Plan: General   Post-op Pain Management:  Regional for Post-op pain   Induction: Intravenous  PONV Risk Score and Plan: 2 and Ondansetron, Dexamethasone and Midazolam  Airway Management Planned: Oral ETT  Additional Equipment: None  Intra-op Plan:   Post-operative Plan: Extubation in OR  Informed Consent: I have reviewed the patients History and Physical, chart, labs and discussed the procedure including the risks, benefits and alternatives for the proposed anesthesia with the patient or authorized representative who has indicated his/her understanding and acceptance.     Dental advisory given  Plan Discussed with: CRNA and Surgeon  Anesthesia Plan Comments: (Discussed risks of anesthesia with patient, including PONV, sore throat, lip/dental damage. Rare risks discussed as well, such as cardiorespiratory and neurological sequelae, and allergic reactions. Patient understands. Discussed r/b/a of interscalene block, including elective nature. Risks discussed: - Rare: bleeding, infection, nerve damage - shortness of breath from hemidiaphragmatic paralysis - unilateral horner's syndrome - poor/non-working blocks Patient understands and agrees. )        Anesthesia Quick Evaluation

## 2021-06-03 NOTE — Transfer of Care (Signed)
Immediate Anesthesia Transfer of Care Note  Patient: Todd [name]  Procedure(s) Performed: Right shoulder arthroscopic revision distal clavicle excision, arthroscopic  vs mini-open rotator cuff repair vs Regeneten patch application, and biceps tenodesis (Right)  Patient Location: PACU  Anesthesia Type:General  Level of Consciousness: drowsy and patient cooperative  Airway & Oxygen Therapy: Patient Spontanous Breathing and Patient connected to face mask oxygen  Post-op Assessment: Report given to RN and Post -op Vital signs reviewed and stable  Post vital signs: Reviewed and stable  Last Vitals:  Vitals Value Taken Time  BP 140/88 06/03/21 1557  Temp    Pulse 97 06/03/21 1600  Resp 24 06/03/21 1600  SpO2 98 % 06/03/21 1600  Vitals shown include unvalidated device data.  Last Pain:  Vitals:   06/03/21 1131  TempSrc: Temporal  PainSc: 7          Complications: No notable events documented.

## 2021-06-03 NOTE — Anesthesia Postprocedure Evaluation (Signed)
Anesthesia Post Note  Patient: Todd [name]  Procedure(s) Performed: Right shoulder arthroscopic revision distal clavicle excision, arthroscopic  vs mini-open rotator cuff repair vs Regeneten patch application, and biceps tenodesis (Right)  Patient location during evaluation: PACU Anesthesia Type: General Level of consciousness: awake and alert Pain management: pain level controlled Vital Signs Assessment: post-procedure vital signs reviewed and stable Respiratory status: spontaneous breathing, nonlabored ventilation, respiratory function stable and patient connected to nasal cannula oxygen Cardiovascular status: blood pressure returned to baseline and stable Postop Assessment: no apparent nausea or vomiting Anesthetic complications: no   No notable events documented.   Last Vitals:  Vitals:   06/03/21 1630 06/03/21 1645  BP: 129/64 135/88  Pulse: 90 86  Resp: 14 16  Temp: 36.7 C   SpO2: 94% 93%    Last Pain:  Vitals:   06/03/21 1630  TempSrc:   PainSc: 0-No pain                 Corinda Gubler

## 2021-09-29 ENCOUNTER — Other Ambulatory Visit: Payer: Self-pay | Admitting: Urology

## 2021-10-15 ENCOUNTER — Other Ambulatory Visit: Payer: Self-pay

## 2021-10-15 DIAGNOSIS — N138 Other obstructive and reflux uropathy: Secondary | ICD-10-CM

## 2021-10-15 DIAGNOSIS — N401 Enlarged prostate with lower urinary tract symptoms: Secondary | ICD-10-CM

## 2021-10-17 ENCOUNTER — Other Ambulatory Visit: Payer: Self-pay

## 2021-10-17 ENCOUNTER — Other Ambulatory Visit: Payer: Medicare Other

## 2021-10-17 ENCOUNTER — Other Ambulatory Visit: Payer: 59

## 2021-10-17 DIAGNOSIS — N138 Other obstructive and reflux uropathy: Secondary | ICD-10-CM

## 2021-10-17 NOTE — Progress Notes (Deleted)
11/02/2018  7:54 AM   Todd [name] 12/23/1965 [id number]  Referring provider: Theotis Burrow, MD [address] [place],   [address]  No chief complaint on file.   Urological history: 1. IC -incontinence, urgency, suprapubic pressure, intermittent burning symptoms for greater than 30 years -told that he had a "thinning of the bladder" after extensive evaluation in [place] at age 56.  He states he was taken to the OR and had what sounds like a cystoscopy with fulguration and hydro distention, also a test done in the office where they put in a solution and it burned which sounds like a potassium sensitivity test and also a solution that was placed in his bladder several times which sounds like rescues solutions.  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  Failed Myrbetriq (Dizzinness) and Toviaz (urinary retention) -admits to depression, anxiety and PTSD -managed with oxybutynin XL 15 mg daily   2. BPH with LU TS -PSA pending -I PSS ***  3. Ejaculatory disorder -unable to achieve ejaculation at times  No chief complaint on file.    HPI: Todd [name] is a 56 y.o. male male who presents today for yearly follow up.     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  Migraines 2-3x/yr.   HTN (hypertension) 05/19/2016   Hypertension    MDD (major depressive disorder), recurrent episode ([place]) 05/28/2016   Neck pain, bilateral 02/15/2016   Primary  osteoarthritis of right knee 01/31/2016   and fingers   PTSD (post-traumatic stress disorder) 06/05/2016   Severe recurrent major depression without psychotic features ([place]) 05/19/2016   Sleep apnea    unable to tolerate CPAP   Substance induced mood disorder ([place]) 05/19/2016   Suicidal ideation 05/19/2016   Type 2 diabetes mellitus with complication, without long-term current use of insulin ([place]) 06/08/2017   Uncontrolled type 2 diabetes mellitus with hyperglycemia, without long-term current use of insulin ([place]) 03/12/2017   Vertigo     Surgical History: Past Surgical History:  Procedure Laterality Date   BACK SURGERY  11/209/18 and 10/08/17   x 2   CIRCUMCISION N/A 07/25/2020   Procedure: CIRCUMCISION ADULT;  Surgeon: Hollice Espy, MD;  Location: [facility] ORS;  Service: Urology;  Laterality: N/A;   COLONOSCOPY WITH PROPOFOL N/A 01/31/2018   Procedure: COLONOSCOPY WITH PROPOFOL;  Surgeon: Manya Silvas, MD;  Location: [facility] ENDOSCOPY;  Service: Endoscopy;  Laterality: N/A;   ESOPHAGOGASTRODUODENOSCOPY (EGD) WITH PROPOFOL N/A 01/31/2018   Procedure: ESOPHAGOGASTRODUODENOSCOPY (EGD) WITH PROPOFOL;  Surgeon: Manya Silvas, MD;  [facility];  Service: Endoscopy;  Laterality: N/A;   LEG AMPUTATION BELOW KNEE     left   NASAL SEPTOPLASTY W/ TURBINOPLASTY Bilateral 04/17/2020   Procedure: NASAL SEPTOPLASTY WITH INFERIORTURBINATE REDUCTION;  Surgeon: Carloyn Manner, MD;  Location: [facility];  Service: ENT;  Laterality: Bilateral;  Diabetic - oral meds  SHOULDER SURGERY Left     Home Medications:  Allergies as of 10/20/2021       Reactions   Bactrim [sulfamethoxazole-trimethoprim] Itching, Other (See Comments)   Causes blisters   Myrbetriq [mirabegron] Other (See Comments)   Dizzy, headache   Benadryl [diphenhydramine] Anxiety, Other (See Comments)   Causes "jerking"   Flonase [fluticasone] Palpitations   Tamsulosin Rash        Medication List         Accurate as of October 17, 2021  7:54 AM. If you have any questions, ask your nurse or doctor.          acetaminophen 500 MG tablet Commonly known as: TYLENOL Take 2 tablets (1,000 mg total) by mouth every 8 (eight) hours.   cloNIDine 0.2 MG tablet Commonly known as: CATAPRES Take 0.2 mg by mouth 2 (two) times daily.   hydrochlorothiazide 25 MG tablet Commonly known as: HYDRODIURIL Take 25 mg by mouth daily.   ibuprofen 200 MG tablet Commonly known as: ADVIL Take 400 mg by mouth every 6 (six) hours as needed for headache or moderate pain.   lisinopril 40 MG tablet Commonly known as: ZESTRIL Take 1 tablet (40 mg total) by mouth daily.   metoCLOPramide 10 MG tablet Commonly known as: REGLAN Take 10 mg by mouth 3 (three) times daily as needed for nausea or vomiting.   omeprazole 40 MG capsule Commonly known as: PRILOSEC Take 40 mg by mouth 2 (two) times daily.   ondansetron 4 MG disintegrating tablet Commonly known as: ZOFRAN-ODT Take 4 mg by mouth 3 (three) times daily as needed for vomiting or nausea.   ondansetron 4 MG disintegrating tablet Commonly known as: Zofran ODT Take 1 tablet (4 mg total) by mouth every 8 (eight) hours as needed for nausea or vomiting.   oxybutynin 15 MG 24 hr tablet Commonly known as: DITROPAN XL TAKE 1 TABLET(15 MG) BY MOUTH DAILY   oxyCODONE 5 MG immediate release tablet Commonly known as: Roxicodone Take 1-2 tablets (5-10 mg total) by mouth every 4 (four) hours as needed (pain).        Allergies:  Allergies  Allergen Reactions   Bactrim [Sulfamethoxazole-Trimethoprim] Itching and Other (See Comments)    Causes blisters   Myrbetriq [Mirabegron] Other (See Comments)    Dizzy, headache   Benadryl [Diphenhydramine] Anxiety and Other (See Comments)    Causes "jerking"   Flonase [Fluticasone] Palpitations   Tamsulosin Rash    Family History: Family History  Problem Relation Age of Onset   Diabetes Mother     Social  History:  reports that he quit smoking about 33 years ago. His smoking use included cigarettes. He has a 11.00 pack-year smoking history. He has never used smokeless tobacco. He reports that he does not drink alcohol and does not use drugs.  ROS: For pertinent review of systems please refer to history of present illness  Physical Exam: There were no vitals taken for this visit.  Constitutional:  Well nourished. Alert and oriented, No acute distress. HEENT: [place] AT, moist mucus membranes.  Trachea midline Cardiovascular: No clubbing, cyanosis, or edema. Respiratory: Normal respiratory effort, no increased work of breathing. GI: Abdomen is soft, non tender, non distended, no abdominal masses. Liver and spleen not palpable.  No hernias appreciated.  Stool sample for occult testing is not indicated.   GU: No CVA tenderness.  No bladder fullness or masses.  Patient with circumcised/uncircumcised phallus. ***Foreskin easily retracted***  Urethral meatus is patent.  No  penile discharge. No penile lesions or rashes. Scrotum without lesions, cysts, rashes and/or edema.  Testicles are located scrotally bilaterally. No masses are appreciated in the testicles. Left and right epididymis are normal. Rectal: Patient with  normal sphincter tone. Anus and perineum without scarring or rashes. No rectal masses are appreciated. Prostate is approximately *** grams, *** nodules are appreciated. Seminal vesicles are normal. Skin: No rashes, bruises or suspicious lesions. Lymph: No inguinal adenopathy. Neurologic: Grossly intact, no focal deficits, moving all 4 extremities. Psychiatric: Normal mood and affect.   Laboratory Data: Glucose 70 - 110 mg/dL 210 High    Sodium 136 - 145 mmol/L 140   Potassium 3.6 - 5.1 mmol/L 3.9   Chloride 97 - 109 mmol/L 102   Carbon Dioxide (CO2) 22.0 - 32.0 mmol/L 26.6   Calcium 8.7 - 10.3 mg/dL 9.4   Urea Nitrogen (BUN) 7 - 25 mg/dL 17   Creatinine 0.7 - 1.3 mg/dL 1.1   Glomerular  Filtration Rate (eGFR), MDRD Estimate >60 mL/min/1.73sq m 69   BUN/Crea Ratio 6.0 - 20.0 15.5   Anion Gap w/K 6.0 - 16.0 15.3   Resulting Agency  [facility] - LAB  Specimen Collected: 06/30/21 07:55 Last Resulted: 06/30/21 14:35  Received From: [facility]  Result Received: 09/30/21 07:31   Hemoglobin A1C 4.2 - 5.6 % 7.5 High    Average Blood Glucose (Calc) mg/dL [facility] - LAB  Narrative Performed by [facility] - LAB Normal Range:    4.2 - 5.6%  Increased Risk:  5.7 - 6.4%  Diabetes:        >= 6.5%  Glycemic Control for adults with diabetes:  <7%   Specimen Collected: 06/30/21 07:55 Last Resulted: 06/30/21 10:17  Received From: [facility]  Result Received: 09/30/21 07:31   Cholesterol, Total 100 - 200 mg/dL 191   Triglyceride 35 - 199 mg/dL 464 High    Comment: Calculated LDL and VLDL not reported due to Triglyceride >400 mg/dL.  HDL (High Density Lipoprotein) Cholesterol 29.0 - 71.0 mg/dL 35.1   Cholesterol/HDL Ratio  5.4   Resulting Agency  [facility] - LAB  Specimen Collected: 06/30/21 07:55 Last Resulted: 06/30/21 10:57  Received From: [facility]  Result Received: 09/30/21 07:31  I have reviewed the labs.  Pertinent Imaging: N/A   Assessment & Plan:    1. IC -UA benign -likely having a flair likely due to upcoming surgeries -continue oxybutynin XL 15 mg daily -urine sent for culture to rule out indolent infection  2. BPH with LUTS -stable  No follow-ups on file.  Zara Council, PA-C  [facility] [address], [place], [place] [address] [phone]

## 2021-10-18 LAB — PSA: Prostate Specific Ag, Serum: 0.5 ng/mL (ref 0.0–4.0)

## 2021-10-19 NOTE — Progress Notes (Signed)
11/02/2018  3:17 PM   Todd [name] [birth year] [id number]  Referring provider: Theotis Burrow, MD [address] [place],  [place] [address]  Chief Complaint  Patient presents with   Follow-up    1 year follow-up    Urological history: 1. IC -incontinence, urgency, suprapubic pressure, intermittent burning symptoms for greater than 30 years -told that he had a "thinning of the bladder" after extensive evaluation in [place] at age 56.  He states he was taken to the OR and had what sounds like a cystoscopy with fulguration and hydro distention, also a test done in the office where they put in a solution and it burned which sounds like a potassium sensitivity test and also a solution that was placed in his bladder several times which sounds like rescues solutions.  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  Failed Myrbetriq (Dizzinness) and Toviaz (urinary retention) -admits to depression, anxiety and PTSD -managed with oxybutynin XL 15 mg daily   2. BPH with LU TS -PSA 0.5 in 10/17/2021 -I PSS 15/5  3. Ejaculatory disorder -unable to achieve ejaculation at times  Chief Complaint  Patient presents with   Follow-up    1 year follow-up   HPI: Todd [name] is a 56 y.o. male male who presents today for yearly follow up.   He has had issues with straining to urinate and feelings of incomplete emptying that occurred months ago.  He is taking the oxybutynin XL as prescribed.  Patient denies any modifying or aggravating factors.  Patient denies any gross hematuria, dysuria or suprapubic/flank pain.  Patient denies any fevers, chills, nausea or vomiting.    He also states that he has been having boils erupt under his armpits and in his crease of the buttocks.  The one in the buttocks continues to drain.    IPSS     Row Name 10/20/21 1400         International Prostate Symptom Score   How often have you had the sensation of not emptying your bladder? Less than half  the time     How often have you had to urinate less than every two hours? More than half the time     How often have you found you stopped and started again several times when you urinated? Less than 1 in 5 times     How often have you found it difficult to postpone urination? Less than 1 in 5 times     How often have you had a weak urinary stream? Less than 1 in 5 times     How often have you had to strain to start urination? About half the time     How many times did you typically get up at night to urinate? 3 Times     Total IPSS Score 15       Quality of Life due to urinary symptoms   If you were to spend the rest of your life with your urinary condition just the way it is now how would you feel about that? Unhappy              Score:  1-7 Mild 8-19 Moderate 20-35 Severe   Patient still having spontaneous erections.  He denies any pain or curvature with erections.  He is having no issues with erections.     SHIM     Row Name 10/20/21 1444         SHIM: Over the  last 6 months:   How do you rate your confidence that you could get and keep an erection? Very High     When you had erections with sexual stimulation, how often were your erections hard enough for penetration (entering your partner)? Almost Always or Always     During sexual intercourse, how often were you able to maintain your erection after you had penetrated (entered) your partner? Almost Always or Always     During sexual intercourse, how difficult was it to maintain your erection to completion of intercourse? Not Difficult     When you attempted sexual intercourse, how often was it satisfactory for you? 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  Migraines 2-3x/yr.   HTN (hypertension) 05/19/2016   Hypertension    MDD (major depressive disorder), recurrent episode ([place]) 05/28/2016   Neck pain, bilateral 02/15/2016   Primary osteoarthritis of right knee 01/31/2016   and fingers   PTSD (post-traumatic stress disorder) 06/05/2016   Severe recurrent major depression without psychotic features ([place]) 05/19/2016   Sleep apnea    unable to tolerate CPAP   Substance induced mood disorder ([place]) 05/19/2016   Suicidal ideation 05/19/2016   Type 2 diabetes mellitus with complication, without long-term current use of insulin ([place]) 06/08/2017   Uncontrolled type 2 diabetes mellitus with hyperglycemia, without long-term current use of insulin ([place]) 03/12/2017   Vertigo     Surgical History: Past Surgical History:  Procedure Laterality Date   BACK SURGERY  11/209/18 and 10/08/17   x 2   CIRCUMCISION N/A 07/25/2020   Procedure: CIRCUMCISION ADULT;  Surgeon: Hollice Espy, MD;  Location: [facility] ORS;  Service: Urology;  Laterality: N/A;   COLONOSCOPY WITH PROPOFOL N/A 01/31/2018   Procedure: COLONOSCOPY WITH PROPOFOL;  Surgeon: Manya Silvas, MD;  Location[facility] ENDOSCOPY;  Service: Endoscopy;  Laterality: N/A;   ESOPHAGOGASTRODUODENOSCOPY (EGD) WITH PROPOFOL N/A 01/31/2018   Procedure: ESOPHAGOGASTRODUODENOSCOPY (EGD) WITH PROPOFOL;  Surgeon: Manya Silvas, MD;  Location: [facility] ENDOSCOPY;  Service: Endoscopy;  Laterality: N/A;   LEG AMPUTATION BELOW KNEE     left   NASAL SEPTOPLASTY W/ TURBINOPLASTY Bilateral 04/17/2020   Procedure: NASAL SEPTOPLASTY WITH INFERIORTURBINATE REDUCTION;  Surgeon: Carloyn Manner, MD;  Location: [facility];  Service: ENT;  Laterality:  Bilateral;  Diabetic - oral meds   SHOULDER SURGERY Left     Home Medications:  Allergies as of 10/20/2021       Reactions   Bactrim [sulfamethoxazole-trimethoprim] Itching, Other (See Comments)   Causes blisters   Myrbetriq [mirabegron] Other (See Comments)   Dizzy, headache   Benadryl [diphenhydramine] Anxiety, Other (See Comments)   Causes "jerking"   Flonase [fluticasone] Palpitations   Tamsulosin Rash        Medication List  Accurate as of October 20, 2021  3:17 PM. If you have any questions, ask your nurse or doctor.          acetaminophen 500 MG tablet Commonly known as: TYLENOL Take 2 tablets (1,000 mg total) by mouth every 8 (eight) hours.   clindamycin 300 MG capsule Commonly known as: CLEOCIN Take 1 capsule (300 mg total) by mouth 3 (three) times daily. Started by: Zara Council, PA-C   cloNIDine 0.2 MG tablet Commonly known as: CATAPRES Take 0.2 mg by mouth 2 (two) times daily.   hydrochlorothiazide 25 MG tablet Commonly known as: HYDRODIURIL Take 25 mg by mouth daily.   ibuprofen 200 MG tablet Commonly known as: ADVIL Take 400 mg by mouth every 6 (six) hours as needed for headache or moderate pain.   lisinopril 40 MG tablet Commonly known as: ZESTRIL Take 1 tablet (40 mg total) by mouth daily.   metoCLOPramide 10 MG tablet Commonly known as: REGLAN Take 10 mg by mouth 3 (three) times daily as needed for nausea or vomiting.   omeprazole 40 MG capsule Commonly known as: PRILOSEC Take 40 mg by mouth 2 (two) times daily.   ondansetron 4 MG disintegrating tablet Commonly known as: ZOFRAN-ODT Take 4 mg by mouth 3 (three) times daily as needed for vomiting or nausea.   ondansetron 4 MG disintegrating tablet Commonly known as: Zofran ODT Take 1 tablet (4 mg total) by mouth every 8 (eight) hours as needed for nausea or vomiting.   oxybutynin 15 MG 24 hr tablet Commonly known as: DITROPAN XL TAKE 1 TABLET(15 MG) BY MOUTH DAILY    oxyCODONE 5 MG immediate release tablet Commonly known as: Roxicodone Take 1-2 tablets (5-10 mg total) by mouth every 4 (four) hours as needed (pain).        Allergies:  Allergies  Allergen Reactions   Bactrim [Sulfamethoxazole-Trimethoprim] Itching and Other (See Comments)    Causes blisters   Myrbetriq [Mirabegron] Other (See Comments)    Dizzy, headache   Benadryl [Diphenhydramine] Anxiety and Other (See Comments)    Causes "jerking"   Flonase [Fluticasone] Palpitations   Tamsulosin Rash    Family History: Family History  Problem Relation Age of Onset   Diabetes Mother     Social History:  reports that he quit smoking about 33 years ago. His smoking use included cigarettes. He has a 11.00 pack-year smoking history. He has never used smokeless tobacco. He reports that he does not drink alcohol and does not use drugs.  ROS: For pertinent review of systems please refer to history of present illness  Physical Exam: BP 122/75    Pulse 70    Ht 5' 8"  (1.727 m)    Wt 265 lb (120.2 kg)    BMI 40.29 kg/m   Constitutional:  Well nourished. Alert and oriented, No acute distress. HEENT: [place] AT, mask in place.  Trachea midline Cardiovascular: No clubbing, cyanosis, or edema. Respiratory: Normal respiratory effort, no increased work of breathing. GU: No CVA tenderness.  No bladder fullness or masses.  Patient with circumcised phallus.  Urethral meatus is patent.  No penile discharge. No penile lesions or rashes. Scrotum without lesions, cysts, rashes and/or edema.  Testicles are located scrotally bilaterally. No masses are appreciated in the testicles. Left and right epididymis are normal. Rectal: Patient with  normal sphincter tone. Anus and perineum without scarring or rashes. No rectal masses are appreciated. Prostate is approximately 50 + grams, could only palpate the apex and midportion of the  gland, no  nodules are appreciated. Seminal vesicles could not be palpated.  A small  infected sebaceous cyst is seen on the right buttocks.  There was no crepitus, erythema or fluctuance noted.  Neurologic: Grossly intact, no focal deficits, moving all 4 extremities. Psychiatric: Normal mood and affect.   Laboratory Data: Glucose 70 - 110 mg/dL 210 High    Sodium 136 - 145 mmol/L 140   Potassium 3.6 - 5.1 mmol/L 3.9   Chloride 97 - 109 mmol/L 102   Carbon Dioxide (CO2) 22.0 - 32.0 mmol/L 26.6   Calcium 8.7 - 10.3 mg/dL 9.4   Urea Nitrogen (BUN) 7 - 25 mg/dL 17   Creatinine 0.7 - 1.3 mg/dL 1.1   Glomerular Filtration Rate (eGFR), MDRD Estimate >60 mL/min/1.73sq m 69   BUN/Crea Ratio 6.0 - 20.0 15.5   Anion Gap w/K 6.0 - 16.0 15.3   Resulting Agency  [facility] - LAB  Specimen Collected: 06/30/21 07:55 Last Resulted: 06/30/21 14:35  Received From: [facility]  Result Received: 09/30/21 07:31   Hemoglobin A1C 4.2 - 5.6 % 7.5 High    Average Blood Glucose (Calc) mg/dL [facility] - LAB  Narrative Performed by [facility] - LAB Normal Range:    4.2 - 5.6%  Increased Risk:  5.7 - 6.4%  Diabetes:        >= 6.5%  Glycemic Control for adults with diabetes:  <7%   Specimen Collected: 06/30/21 07:55 Last Resulted: 06/30/21 10:17  Received From: [facility]  Result Received: 09/30/21 07:31   Cholesterol, Total 100 - 200 mg/dL 191   Triglyceride 35 - 199 mg/dL 464 High    Comment: Calculated LDL and VLDL not reported due to Triglyceride >400 mg/dL.  HDL (High Density Lipoprotein) Cholesterol 29.0 - 71.0 mg/dL 35.1   Cholesterol/HDL Ratio  5.4   Resulting Agency  [facility] - LAB  Specimen Collected: 06/30/21 07:55 Last Resulted: 06/30/21 10:57  Received From: [facility]  Result Received: 09/30/21 07:31  I have reviewed the labs.  Pertinent Imaging: N/A   Assessment & Plan:    1. IC -UA benign -likely having a flair likely due to upcoming  surgeries -continue oxybutynin XL 15 mg daily -urine sent for culture to rule out indolent infection  2. BPH with LUTS -PSA stable -DRE benign -UA benign -PVR < 300 cc -continue conservative management, avoiding bladder irritants and timed voiding's   3. Infected sebaceous cyst -advised to soak the cyst in warm compresses -given a script for Clindamycin 300 mg, TID x 10 days -see PCP at the end of the month   4. Premature ejaculation -resolved   Return in about 1 year (around 10/20/2022) for I PSS, SHIM, PSA and exam .  Zara Council, [facility]  [place] [address] [place], [place] [address] [phone]

## 2021-10-20 ENCOUNTER — Encounter: Payer: Self-pay | Admitting: Urology

## 2021-10-20 ENCOUNTER — Ambulatory Visit: Payer: Self-pay | Admitting: Urology

## 2021-10-20 ENCOUNTER — Other Ambulatory Visit: Payer: Self-pay

## 2021-10-20 ENCOUNTER — Ambulatory Visit (INDEPENDENT_AMBULATORY_CARE_PROVIDER_SITE_OTHER): Payer: 59 | Admitting: Urology

## 2021-10-20 VITALS — BP 122/75 | HR 70 | Ht 68.0 in | Wt 265.0 lb

## 2021-10-20 DIAGNOSIS — L723 Sebaceous cyst: Secondary | ICD-10-CM | POA: Diagnosis not present

## 2021-10-20 DIAGNOSIS — N401 Enlarged prostate with lower urinary tract symptoms: Secondary | ICD-10-CM

## 2021-10-20 DIAGNOSIS — N138 Other obstructive and reflux uropathy: Secondary | ICD-10-CM | POA: Diagnosis not present

## 2021-10-20 DIAGNOSIS — N3010 Interstitial cystitis (chronic) without hematuria: Secondary | ICD-10-CM | POA: Diagnosis not present

## 2021-10-20 DIAGNOSIS — F5232 Male orgasmic disorder: Secondary | ICD-10-CM

## 2021-10-20 DIAGNOSIS — L089 Local infection of the skin and subcutaneous tissue, unspecified: Secondary | ICD-10-CM

## 2021-10-20 MED ORDER — CLINDAMYCIN HCL 300 MG PO CAPS: 300.0000 mg | ORAL_CAPSULE | Freq: Three times a day (TID) | ORAL | 0 refills | Status: DC

## 2021-11-18 ENCOUNTER — Other Ambulatory Visit: Payer: Self-pay | Admitting: Orthopedic Surgery

## 2021-11-18 DIAGNOSIS — G8929 Other chronic pain: Secondary | ICD-10-CM

## 2021-11-29 ENCOUNTER — Other Ambulatory Visit: Payer: Self-pay

## 2021-11-29 ENCOUNTER — Ambulatory Visit
Admission: RE | Admit: 2021-11-29 | Discharge: 2021-11-29 | Disposition: A | Discharge status: Home / Self Care | Payer: 59 | Source: Ambulatory Visit | Attending: Orthopedic Surgery | Admitting: Orthopedic Surgery

## 2021-11-29 DIAGNOSIS — M25511 Pain in right shoulder: Secondary | ICD-10-CM | POA: Insufficient documentation

## 2021-11-29 DIAGNOSIS — G8929 Other chronic pain: Secondary | ICD-10-CM | POA: Insufficient documentation

## 2021-12-09 ENCOUNTER — Ambulatory Visit: Payer: 59 | Admitting: Physician Assistant

## 2022-02-26 ENCOUNTER — Other Ambulatory Visit: Payer: Self-pay

## 2022-02-26 ENCOUNTER — Emergency Department: Payer: 59

## 2022-02-26 ENCOUNTER — Emergency Department
Admission: EM | Admit: 2022-02-26 | Discharge: 2022-02-26 | Disposition: A | Discharge status: Home / Self Care | Payer: 59 | Attending: Emergency Medicine | Admitting: Emergency Medicine

## 2022-02-26 DIAGNOSIS — M79671 Pain in right foot: Secondary | ICD-10-CM | POA: Insufficient documentation

## 2022-02-26 DIAGNOSIS — I10 Essential (primary) hypertension: Secondary | ICD-10-CM | POA: Diagnosis not present

## 2022-02-26 DIAGNOSIS — E119 Type 2 diabetes mellitus without complications: Secondary | ICD-10-CM | POA: Insufficient documentation

## 2022-02-26 DIAGNOSIS — Z23 Encounter for immunization: Secondary | ICD-10-CM | POA: Diagnosis not present

## 2022-02-26 MED ORDER — CEPHALEXIN 500 MG PO CAPS: 500.0000 mg | ORAL_CAPSULE | Freq: Four times a day (QID) | ORAL | 0 refills | Status: AC

## 2022-02-26 MED ORDER — TETANUS-DIPHTH-ACELL PERTUSSIS 5-2.5-18.5 LF-MCG/0.5 IM SUSY
0.5000 mL | PREFILLED_SYRINGE | Freq: Once | INTRAMUSCULAR | Status: AC
  Administered 2022-02-26: 0.5 mL via INTRAMUSCULAR
  Filled 2022-02-26: qty 0.5

## 2022-02-26 NOTE — ED Notes (Signed)
56 yom with c/c of right middle toe pain. The pt advised on Tuesday he accidentally dropped a turbine on his right foot. The pt advised he is having some pain but mainly his family was concerned due to him being a diabetic. The pt states he is a diabetic with neuropathy in his foot and lower leg.

## 2022-02-26 NOTE — Discharge Instructions (Addendum)
-  Follow-up with the podiatrist listed above, as discussed  -He may take Tylenol/ibuprofen as needed for pain.  -Take all of antibiotics as prescribed.  -Return to the emergency department anytime if you begin to experience any new or worsening symptoms.

## 2022-02-26 NOTE — ED Triage Notes (Signed)
Pt here after dropping a turbine from a transmission on his third toe on his right foot. Pt is a diabetic and states his toe has changed color and he is concerned. 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

## 2022-02-26 NOTE — ED Provider Notes (Signed)
[facility] Provider Note    Event Date/Time   First MD Initiated Contact with Patient 02/26/22 [phone]     (approximate)   History   Chief Complaint Foot Pain   HPI Todd [name] is a 56 y.o. male, history of substance-induced mood disorder, hypertension, GERD, alcohol use disorder, type 2 diabetes, presents to the emergency department for evaluation of foot pain.  Patient reports dropping a turbine from a transmission onto his right foot.  Since then he has been experiencing mild pain along his third toe and has noticed bruising/discoloration as well.  Patient states that he is still able to ambulate without assistance.  Denies fever/chills, ankle pain, leg pain, knee pain, thigh pain, or hip pain.    History Limitations: No limitations.        Physical Exam  Triage Vital Signs: ED Triage Vitals  Enc Vitals Group     BP 02/26/22 0848 (!) 153/86     Pulse Rate 02/26/22 0848 66     Resp 02/26/22 0848 18     Temp 02/26/22 0849 98.1 F (36.7 C)     Temp Source 02/26/22 0849 Oral     SpO2 02/26/22 0848 98 %     Weight 02/26/22 0848 265 lb 10.5 oz (120.5 kg)     Height 02/26/22 0848 5\' 8"  (1.727 m)     Head Circumference --      Peak Flow --      Pain Score 02/26/22 0848 4     Pain Loc --      Pain Edu? --      Excl. in GC? --     Most recent vital signs: Vitals:   02/26/22 0848 02/26/22 0849  BP: (!) 153/86   Pulse: 66   Resp: 18   Temp:  98.1 F (36.7 C)  SpO2: 98%     General: Awake, NAD.  Skin: Warm, dry. No rashes or lesions.  Eyes: PERRL. Conjunctivae normal.  CV: Good peripheral perfusion.  Resp: Normal effort.  Abd: Soft, non-tender. No distention.  Neuro: At baseline. No gross neurological deficits.   Focused Exam: No gross deformities to the right foot.  The middle toe does appear to have some erythema and ecchymosis present around it.  Range of motion intact.  No bony tenderness, though patient admits that it is difficult to  tell as he has a baseline neuropathy from his diabetes.   Physical Exam    ED Results / Procedures / Treatments  Labs (all labs ordered are listed, but only abnormal results are displayed) Labs Reviewed - No data to display   EKG N/A.   RADIOLOGY  ED Provider Interpretation: I personally reviewed this x-ray, no evidence of fracture or dislocation.  DG Foot Complete Right  Result Date: 02/26/2022 CLINICAL DATA:  Right foot pain after injury. EXAM: RIGHT FOOT COMPLETE - 3+ VIEW COMPARISON:  October 30, 2019. FINDINGS: There is no evidence of fracture or dislocation. There is no evidence of arthropathy or other focal bone abnormality. Soft tissues are unremarkable. IMPRESSION: Negative. Electronically Signed   By: November 01, 2019 M.D.   On: 02/26/2022 09:38    PROCEDURES:  Critical Care performed: N/A.  Procedures    MEDICATIONS ORDERED IN ED: Medications  Tdap (BOOSTRIX) injection 0.5 mL (has no administration in time range)     IMPRESSION / MDM / ASSESSMENT AND PLAN / ED COURSE  I reviewed the triage vital signs and the nursing notes.  Differential diagnosis includes, but is not limited to, cellulitis, fracture/dislocation, toe/foot sprain.   ED Course Patient appears well, vitals within normal limits for the patient, NAD.  Assessment/Plan Patient presents with right foot/middle toe pain secondary to mechanical injury from dropping turbine on his foot.  X-ray reassuring for no evidence of fracture or dislocation.  Patient maintains normal range of motion of the digits.  On physical exam he does have some erythema/ecchymosis present.  Does not appear necrotic.  Normal cap refill.  However, given his history of diabetes, we will go ahead and provide empiric coverage for possible infection.  Additionally provided him with tetanus booster.  Recommend they follow-up with podiatry within the next week for further evaluation.  We will plan to  discharge  Provided the patient with anticipatory guidance, return precautions, and educational material. Encouraged the patient to return to the emergency department at any time if they begin to experience any new or worsening symptoms. Patient expressed understanding and agreed with the plan.       FINAL CLINICAL IMPRESSION(S) / ED DIAGNOSES   Final diagnoses:  Foot pain, right     Rx / DC Orders   ED Discharge Orders          Ordered    cephALEXin (KEFLEX) 500 MG capsule  4 times daily        02/26/22 0944             Note:  This document was prepared using Dragon voice recognition software and may include unintentional dictation errors.   Varney Daily, [place] 02/26/22 [id number]    Jene Every, MD 02/26/22 [phone]

## 2022-03-15 ENCOUNTER — Emergency Department: Payer: 59

## 2022-03-15 ENCOUNTER — Other Ambulatory Visit: Payer: Self-pay

## 2022-03-15 ENCOUNTER — Emergency Department
Admission: EM | Admit: 2022-03-15 | Discharge: 2022-03-15 | Disposition: A | Discharge status: Home / Self Care | Payer: 59 | Attending: Emergency Medicine | Admitting: Emergency Medicine

## 2022-03-15 DIAGNOSIS — R5383 Other fatigue: Secondary | ICD-10-CM | POA: Diagnosis not present

## 2022-03-15 DIAGNOSIS — R110 Nausea: Secondary | ICD-10-CM | POA: Diagnosis not present

## 2022-03-15 DIAGNOSIS — R079 Chest pain, unspecified: Secondary | ICD-10-CM | POA: Diagnosis not present

## 2022-03-15 DIAGNOSIS — I10 Essential (primary) hypertension: Secondary | ICD-10-CM | POA: Insufficient documentation

## 2022-03-15 DIAGNOSIS — R519 Headache, unspecified: Secondary | ICD-10-CM | POA: Insufficient documentation

## 2022-03-15 DIAGNOSIS — R0602 Shortness of breath: Secondary | ICD-10-CM | POA: Insufficient documentation

## 2022-03-15 DIAGNOSIS — E1165 Type 2 diabetes mellitus with hyperglycemia: Secondary | ICD-10-CM | POA: Diagnosis not present

## 2022-03-15 LAB — BASIC METABOLIC PANEL
Anion gap: 7 (ref 5–15)
BUN: 15 mg/dL (ref 6–20)
CO2: 25 mmol/L (ref 22–32)
Calcium: 9.2 mg/dL (ref 8.9–10.3)
Chloride: 107 mmol/L (ref 98–111)
Creatinine, Ser: 0.98 mg/dL (ref 0.61–1.24)
GFR, Estimated: >60 mL/min (ref >60)
Glucose, Bld: 187 mg/dL — ABNORMAL HIGH (ref 70–99)
Potassium: 3.5 mmol/L (ref 3.5–5.1)
Sodium: 139 mmol/L (ref 135–145)

## 2022-03-15 LAB — CBC
HCT: 49.6 % (ref 39.0–52.0)
Hemoglobin: 16.4 g/dL (ref 13.0–17.0)
MCH: 27.2 pg (ref 26.0–34.0)
MCHC: 33.1 g/dL (ref 30.0–36.0)
MCV: 82.3 fL (ref 80.0–100.0)
Platelets: 239 10*3/uL (ref 150–400)
RBC: 6.03 MIL/uL — ABNORMAL HIGH (ref 4.22–5.81)
RDW: 13 % (ref 11.5–15.5)
WBC: 7.5 10*3/uL (ref 4.0–10.5)
nRBC: 0 % (ref 0.0–0.2)

## 2022-03-15 LAB — TROPONIN I (HIGH SENSITIVITY)
Troponin I (High Sensitivity): 4 ng/L (ref <18)
Troponin I (High Sensitivity): 4 ng/L (ref <18)

## 2022-03-15 MED ORDER — ACETAMINOPHEN 500 MG PO TABS
1000.0000 mg | ORAL_TABLET | Freq: Once | ORAL | Status: AC
  Administered 2022-03-15: 1000 mg via ORAL
  Filled 2022-03-15: qty 2

## 2022-03-15 MED ORDER — ONDANSETRON 4 MG PO TBDP
4.0000 mg | ORAL_TABLET | Freq: Once | ORAL | Status: AC
  Administered 2022-03-15: 4 mg via ORAL
  Filled 2022-03-15: qty 1

## 2022-03-15 NOTE — ED Triage Notes (Signed)
Pt via POV from home. Pt c/o SOB, dizziness and centralized chest discomfort that started yesterday. Denies cough. Denies fever. Denies hx respiratory or cardiac hx. Pt states that he also noticed some drainage from his incision on the L hand, pt had carpal tunnel surgery 2 weeks. Incision is clean and dry. Pt is A&Ox4 and NAD

## 2022-03-15 NOTE — ED Provider Notes (Signed)
[facility] Provider Note    Event Date/Time   First MD Initiated Contact with Patient 03/15/22 1228     (approximate)   History   Chest Pain and Shortness of Breath   HPI  Todd [name] is a 56 y.o. male past medical history of carpal tunnel syndrome, hypertension hyperlipidemia who presents with dizziness headache shortness of breath chest pain.  Patient symptoms started yesterday.  Endorses feeling somewhat lightheaded having a mild headache and fatigue.  He has had some intermittent stabbing central chest pain that comes last for seconds and then goes away.  Does not currently have it today.  He felt somewhat short of breath yesterday has not felt short of breath today.  Still has mild headache and some nausea.  Has not vomited denies abdominal pain no fevers or chills.  He had carpal tunnel syndrome recently about 2 weeks ago.  No cardiac history.    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  Migraines 2-3x/yr.   HTN (hypertension) 05/19/2016   Hypertension    MDD (major depressive disorder), recurrent episode (HCC) 05/28/2016   Neck pain, bilateral 02/15/2016   Primary osteoarthritis of right knee 01/31/2016   and fingers   PTSD (post-traumatic stress disorder) 06/05/2016   Severe recurrent major depression without psychotic features (HCC) 05/19/2016   Sleep apnea    unable to tolerate CPAP   Substance induced mood disorder (HCC) 05/19/2016   Suicidal ideation 05/19/2016   Type 2 diabetes mellitus with complication, without long-term current use of insulin (HCC) 06/08/2017   Uncontrolled type 2 diabetes  mellitus with hyperglycemia, without long-term current use of insulin (HCC) 03/12/2017   Vertigo     Patient Active Problem List   Diagnosis Date Noted   Obesity (BMI 35.0-39.9 without comorbidity) 12/14/2019   Metatarsalgia of right foot 10/30/2019   Acquired absence of extremity 10/30/2019   Closed fracture of distal end of radius 05/31/2018   Radiculopathy of lumbosacral region 09/30/2017   Spondylolisthesis at L5-S1 level 09/30/2017   Morbid obesity (HCC) 09/06/2017   Hepatic steatosis 08/26/2017   Non-intractable vomiting with nausea 08/26/2017   Upper abdominal pain 08/26/2017   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   Gastroparesis 06/08/2017   History of noncompliance with medical treatment 06/08/2017   Diabetes mellitus type 2 in obese (HCC) 06/08/2017   Uncontrolled type 2 diabetes mellitus with hyperglycemia (HCC) 03/12/2017   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   Dislocation of right thumb 06/05/2016   Anxiety disorder 06/05/2016   MDD (major depressive disorder), recurrent episode (HCC) 05/28/2016   Substance induced mood disorder (HCC) 05/19/2016   Severe recurrent major depression without psychotic features (HCC) 05/19/2016   Suicidal ideation 05/19/2016   Essential (primary) hypertension 05/19/2016   GERD (gastroesophageal reflux disease) 05/19/2016   Alcohol use disorder, mild, abuse 05/19/2016   Chronic left shoulder pain 02/15/2016   Neck pain, bilateral 02/15/2016   Primary osteoarthritis of right knee 01/31/2016     Physical Exam  Triage Vital Signs: ED Triage Vitals  Enc Vitals Group     BP 03/15/22  1145 (!) 145/79     Pulse Rate 03/15/22 1145 77     Resp 03/15/22 1145 18     Temp 03/15/22 1145 98.4 F (36.9 C)     Temp Source 03/15/22 1145 Oral     SpO2 03/15/22 1145 97 %     Weight 03/15/22 1137 249 lb (112.9 kg)     Height 03/15/22 1137 5\' 8"  (1.727 m)     Head Circumference --      Peak Flow --      Pain Score 03/15/22 1137 3     Pain Loc --       Pain Edu? --      Excl. in GC? --     Most recent vital signs: Vitals:   03/15/22 1145  BP: (!) 145/79  Pulse: 77  Resp: 18  Temp: 98.4 F (36.9 C)  SpO2: 97%     General: Awake, no distress.  CV:  Good peripheral perfusion.  Resp:  Normal effort.  Lungs are clear Abd:  No distention.  Abdomen soft and nontender Neuro:             Awake, Alert, Oriented x 3  Other:  Incision from left carpal tunnel syndrome is clean dry and intact   ED Results / Procedures / Treatments  Labs (all labs ordered are listed, but only abnormal results are displayed) Labs Reviewed  BASIC METABOLIC PANEL - Abnormal; Notable for the following components:      Result Value   Glucose, Bld 187 (*)    All other components within normal limits  CBC - Abnormal; Notable for the following components:   RBC 6.03 (*)    All other components within normal limits  TROPONIN I (HIGH SENSITIVITY)  TROPONIN I (HIGH SENSITIVITY)     EKG  EKG interpretation performed by myself: NSR, nml axis, nml intervals, no acute ischemic changes    RADIOLOGY I reviewed and interpreted the CXR which does not show any acute cardiopulmonary process    PROCEDURES:  Critical Care performed: No  Procedures    MEDICATIONS ORDERED IN ED: Medications  ondansetron (ZOFRAN-ODT) disintegrating tablet 4 mg (4 mg Oral Given 03/15/22 1340)  acetaminophen (TYLENOL) tablet 1,000 mg (1,000 mg Oral Given 03/15/22 1340)     IMPRESSION / MDM / ASSESSMENT AND PLAN / ED COURSE  I reviewed the triage vital signs and the nursing notes.                              Patient's presentation is most consistent with acute presentation with potential threat to life or bodily function.  Differential diagnosis includes, but is not limited to, viral illness, ACS, pulmonary embolism, pneumonia  57 year old male presents with various symptoms including headache lightheadedness fatigue shortness of breath and chest pain.  The chest  pain is rather short-lived sharp central pain that he felt yesterday and once today but is asymptomatic currently.  He felt short of breath yesterday but that is resolved.  Continues to have some nausea headache and generalized fatigue.  Has not had fever.  Did have surgery for carpal tunnel syndrome about 2 weeks ago.  His EKG has an inverted T wave in lead III but otherwise is nonischemic.  Patient appears well on exam lungs are clear he has a prior left BKA but right leg does not have any evidence of DVT.  Consider pulmonary embolism given the recent surgery however with  him no longer having shortness of breath and no chest pain at this time feel this is less likely his sats are also normal he is not tachycardic.  Also considered ACS with the chest pain nausea and generalized fatigue but with a nonischemic EKG and 2 negative troponins feel this is also less likely.  Possible that he has a viral illness with the vague symptoms including headache fatigue.  There is no evidence of bacterial pneumonia on chest x-ray.  Patient was given Zofran for his nausea and Tylenol for headache.  Gust return precautions and PCP follow-up.       FINAL CLINICAL IMPRESSION(S) / ED DIAGNOSES   Final diagnoses:  Chest pain, unspecified type     Rx / DC Orders   ED Discharge Orders     None        Note:  This document was prepared using Dragon voice recognition software and may include unintentional dictation errors.   Georga HackingMcHugh, Shakaya Bhullar Rose, MD 03/15/22 [id number]

## 2022-03-15 NOTE — ED Notes (Signed)
Pt stating they were outside all day yesterday and last night became dizzy, nauseated and started having midsternal chest pain with shob and difficulty swallowing. Pt stating they went to bed and woke today "still not feeling right" pt presents today alert and oriented endorsing midsternal CP 3/10

## 2022-03-15 NOTE — Discharge Instructions (Signed)
Your cardiac work-up including your cardiac enzymes and EKG were all reassuring.  It is unlikely that you are having a heart attack.  However if your pain in your chest is worsening or you develop recurrent shortness of breath please return to the emergency department.  Please also follow-up with your primary care provider.

## 2022-05-13 ENCOUNTER — Telehealth: Payer: Self-pay | Admitting: Physical Therapy

## 2022-05-13 NOTE — Telephone Encounter (Signed)
Called pt to ask if he wanted to come in earlier for eval because of increased availability in schedule. Pt did not respond so left VM instructing pt to call back to be scheduled for earlier apt.

## 2022-05-14 ENCOUNTER — Ambulatory Visit: Payer: 59 | Attending: Orthopedic Surgery | Admitting: Physical Therapy

## 2022-05-14 DIAGNOSIS — M25511 Pain in right shoulder: Secondary | ICD-10-CM | POA: Diagnosis present

## 2022-05-14 DIAGNOSIS — M67813 Other specified disorders of tendon, right shoulder: Secondary | ICD-10-CM | POA: Diagnosis present

## 2022-05-14 DIAGNOSIS — G8929 Other chronic pain: Secondary | ICD-10-CM | POA: Insufficient documentation

## 2022-05-14 NOTE — Therapy (Signed)
OUTPATIENT PHYSICAL THERAPY SHOULDER EVALUATION   Patient Name: Todd [name] MRN: [id number] DOB:12/13/1965, 56 y.o., male Today's Date: 05/14/2022   PT End of Session - 05/14/22 1016     Visit Number 1    Number of Visits 20    Date for PT Re-Evaluation 07/23/22    Authorization Type UHC Medicare/Medicaid    PT Start Time 0930    PT Stop Time 1015    PT Time Calculation (min) 45 min    Activity Tolerance Patient tolerated treatment well             Past Medical History:  Diagnosis Date   Anxiety    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   Chronic left shoulder pain 02/15/2016   Depression    Diabetes mellitus without complication (HCC)    Dislocation of right thumb 06/05/2016   Diverticulitis    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  Migraines 2-3x/yr.   HTN (hypertension) 05/19/2016   Hypertension    MDD (major depressive disorder), recurrent episode (HCC) 05/28/2016   Neck pain, bilateral 02/15/2016   Primary osteoarthritis of right knee 01/31/2016   and fingers   PTSD (post-traumatic stress disorder) 06/05/2016   Severe recurrent major depression without psychotic features (HCC) 05/19/2016   Sleep apnea    unable to tolerate CPAP   Substance induced mood disorder (HCC) 05/19/2016   Suicidal ideation 05/19/2016   Type 2 diabetes mellitus with complication, without long-term current use of insulin (HCC) 06/08/2017   Uncontrolled type 2 diabetes mellitus with hyperglycemia, without long-term current use of insulin (HCC) 03/12/2017   Vertigo    Past Surgical History:  Procedure Laterality Date   BACK SURGERY  11/209/18 and 10/08/17   x 2   CIRCUMCISION N/A 07/25/2020   Procedure: CIRCUMCISION ADULT;  Surgeon: Vanna ScotlandBrandon, Ashley, MD;  Location: [facility];  Service: Urology;  Laterality: N/A;   COLONOSCOPY WITH PROPOFOL N/A 01/31/2018   Procedure: COLONOSCOPY WITH  PROPOFOL;  Surgeon: Scot JunElliott, Robert T, MD;  Location[facility];  Service: Endoscopy;  Laterality: N/A;   ESOPHAGOGASTRODUODENOSCOPY (EGD) WITH PROPOFOL N/A 01/31/2018   Procedure: ESOPHAGOGASTRODUODENOSCOPY (EGD) WITH PROPOFOL;  Surgeon: Scot JunElliott, Robert T, MD;  [facility];  Service: Endoscopy;  Laterality: N/A;   LEG AMPUTATION BELOW KNEE     left   NASAL SEPTOPLASTY W/ TURBINOPLASTY Bilateral 04/17/2020   Procedure: NASAL SEPTOPLASTY WITH INFERIORTURBINATE REDUCTION;  Surgeon: Bud FaceVaught, Creighton, MD;  [facility];  Service: ENT;  Laterality: Bilateral;  Diabetic - oral meds   SHOULDER SURGERY Left    Patient Active Problem List   Diagnosis Date Noted   Obesity (BMI 35.0-39.9 without comorbidity) 12/14/2019   Metatarsalgia of right foot 10/30/2019   Acquired absence of extremity 10/30/2019   Closed fracture of distal end of radius 05/31/2018   Radiculopathy of lumbosacral region 09/30/2017   Spondylolisthesis at L5-S1 level 09/30/2017   Morbid obesity (HCC) 09/06/2017   Hepatic steatosis 08/26/2017   Non-intractable vomiting with nausea 08/26/2017   Upper abdominal pain 08/26/2017   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   Gastroparesis 06/08/2017   History of noncompliance with medical treatment 06/08/2017   Diabetes mellitus type 2 in obese (HCC) 06/08/2017   Uncontrolled type 2 diabetes mellitus with hyperglycemia (HCC) 03/12/2017   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 Presley Raddle   REFERRING PROVIDER: Dr. Roney Mans   REFERRING DIAG: Right shoulder pain   THERAPY DIAG:  Chronic right shoulder pain - Plan: PT plan of care cert/re-cert  Biceps tendonosis of right shoulder - Plan: PT plan of care cert/re-cert  Rationale for Evaluation and Treatment Rehabilitation  ONSET DATE: 05/06/22  SUBJECTIVE:                                                                                                                                                                                      SUBJECTIVE STATEMENT:  Pt reports falling and hitting his right shoulder into wall where he hurt it. They performed multiple surgeries where he eventually had bicep tendonesis. He also mentions having carpal tunnel surgeries recently. Pt reports that he felt like surgery went well. He has not been moving shoulder and using pillow to prop arm when sleeping to prevent right shoulder extension. He wants to be able to return to gym to be able to workout.   PERTINENT HISTORY:   Per Dr. Hinda Glatter note on 05/11/22:   Todd [name] is a 56 y.o. male status post right biceps tenodesis revision on 05/06/2022.  PLAN: Provided patient with physical therapy referral and post-op rehab protocol. Advised to continue sling use per protocol. Discussed gentle elbow range of motion to prevent stiffness. All questions were answered and he is in agreement with plan.  - Follow-up plan: 4 weeks - X-rays/plan at next visit: none.   PAIN:  Are you having pain? Yes: NPRS scale: 3/10 Pain location: Right anterior surface of shoulder  Pain description: Achy  Aggravating factors: Moving shoulder  Relieving factors: Keep shoulder still  PRECAUTIONS: Shoulder  WEIGHT BEARING RESTRICTIONS Yes no weight bearing through RUE   FALLS:  Has patient fallen in last 6 months? Yes. Number of falls  1  LIVING ENVIRONMENT: Lives with: lives with their spouse Lives in: Did not ask  Stairs: Did not ask  Has following equipment at home: None  OCCUPATION: Journalist, newspaper   PLOF: Independent  PATIENT GOALS To regain use of right shoulder to be able to return to gym.   OBJECTIVE:  VITALS: BP 138/80 HR 77 SpO2 98  DIAGNOSTIC FINDINGS:    EXAM: XR SHOULDER 3 OR MORE VIEWS RIGHT  DATE: 03/13/2022 10:49 AM  ACCESSION: [id number] UN  DICTATED: 03/13/2022 11:03 AM  [facility]   CLINICAL INDICATION: 56 years old Male with evaluate shoulder pain/previous repair  - M25.511 - Acute pain of right shoulder        COMPARISON: MRI dated 05/12/2021   TECHNIQUE: AP, Grashey, outlet, and axillary views of the right shoulder.   FINDINGS:  No acute fractures. No malalignment. Sequelae of distal clavicle resection. Glenohumeral joint space is preserved with small glenoid and inferior humeral head osteophyte. Peripherally sclerotic and circumscribed lucent lesion within the proximal humerus measuring approximately 6.2 x 2.3 cm without endosteal scalloping, cortical breakthrough or periosteal reaction. Irregularity along the greater tuberosity with small adjacent osseous bodies compatible with rotator cuff tendinopathy. Partially imaged lungs are clear.   ///////////////////////////////////////////////////////////////////////////////////////////////////////////////////////////////////////////  Joseph Art, MD - 03/13/2022  Formatting of this note might be different from the original.  EXAM: XR SHOULDER 3 OR MORE VIEWS RIGHT  DATE: 03/13/2022 10:49 AM  ACCESSION: [id number] UN  DICTATED: 03/13/2022 11:03 AM  [facility]PRET[facility] LOC[facility]: [facility]   CLINICAL INDICATION: 56 years old Male with evaluate shoulder pain/previous repair  - M25.511 - Acute pain of right shoulder     COMPARISON: MRI dated 05/12/2021   TECHNIQUE: AP, Grashey, outlet, and axillary views  of the right shoulder.   FINDINGS:  No acute fractures. No malalignment. Sequelae of distal clavicle resection. Glenohumeral joint space is preserved with small glenoid and inferior humeral head osteophyte. Peripherally sclerotic and circumscribed lucent lesion within the proximal humerus measuring approximately 6.2 x 2.3 cm without endosteal scalloping, cortical breakthrough or periosteal reaction. Irregularity along the greater tuberosity with small adjacent osseous bodies compatible with rotator cuff tendinopathy. Partially imaged lungs are clear.   IMPRESSION:  No acute fracture or dislocation.   Redemonstrated peripherally sclerotic proximal humerus lesion compatible with a nonaggressive osseous lesion. This is better characterized on prior MRI dated 05/12/2021  PATIENT SURVEYS:  FOTO 30/100  COGNITION:  Overall cognitive status: Within functional limits for tasks assessed     SENSATION: WFL  POSTURE: Rounded shoulders   UPPER EXTREMITY ROM:      Active ROM Right eval Left eval  Shoulder flexion 0 180  Shoulder extension 0 60  Shoulder abduction 180  180  Shoulder adduction    Shoulder internal rotation 70 70  Shoulder external rotation 90 90  Elbow flexion 150 150  Elbow extension 60 60  Wrist flexion 80 80  Wrist extension 70 70  Wrist ulnar deviation 30 30  Wrist radial deviation 20 20  Wrist pronation 80 80  Wrist supination 80 80  (Blank rows = not tested)       UPPER EXTREMITY MMT:  MMT Right eval Left eval  Shoulder flexion NT 5  Shoulder extension NT  5  Shoulder abduction NT  5  Shoulder adduction    Shoulder internal rotation    Shoulder external rotation    Middle trapezius  NT  Lower trapezius  NT   Elbow flexion  5  Elbow extension  5  Wrist flexion  4+*  Wrist extension  4+*  Wrist ulnar deviation    Wrist radial deviation    Wrist pronation    Wrist supination    Grip strength (lbs)  Fair*  (Blank rows = not tested)  Patient  had recent carpal tunnel surgery on both  wris  SHOULDER SPECIAL TESTS:  Not applicable because s/p right biceps tendesis 05/06/22  JOINT MOBILITY TESTING:  N/a  PALPATION:  Anterior surface of right shoulder    TODAY'S TREATMENT:  Scapular Retraction 2 x 10  RUE ball squeezes  1 x 10   PATIENT EDUCATION: Education details: Form and technique for appropriate exercise.  Person educated: Patient Education method: Explanation, Demonstration, Verbal cues, and Handouts Education comprehension: verbalized understanding, returned demonstration, and verbal cues required             Educated patient on rehab protocol and precautions for post-op phase : [email or web address]   HOME EXERCISE PROGRAM: Access Code: [email or web address] URL: [email or web address] Date: 05/14/2022 Prepared by: Ellin Goodie  Exercises - Seated Scapular Retraction  - 1 x daily - 3 x weekly - 3 sets - 10 reps Ball Squeezes 3 x 10   ASSESSMENT:  CLINICAL IMPRESSION: Patient is a 56 y.o. male  who was seen today for physical therapy evaluation and treatment for s/p 1 week for right biceps tenodesis. He exhibits decreased right shoulder ROM and strength that makes it unable to perform right shoulder functional activities. PT educated precautions for post-op phase of rehab protocol with no shoulder extension, AROM elbow and shoulder, and to prop elbow with pillow when sleeping to avoid elbow extension. PT instructed pt to return to therapy after 4 weeks because of limited amount of activities that pt could complete within protocol. 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       OBJECTIVE IMPAIRMENTS decreased ROM, decreased strength, impaired UE functional use, and pain.    ACTIVITY LIMITATIONS carrying, lifting, dressing, self feeding, reach over head, and hygiene/grooming  PARTICIPATION LIMITATIONS: meal prep, cleaning, shopping, occupation, and yard work  PERSONAL FACTORS 3+ comorbidities: T2DM, PTSD, Sleep apnea  are also affecting patient's functional outcome.   REHAB POTENTIAL: Good  CLINICAL DECISION MAKING: Stable/uncomplicated  EVALUATION COMPLEXITY: Low   GOALS: Goals reviewed with patient? No  SHORT TERM GOALS: Target date: 05/28/2022  (Remove Blue Hyperlink)  Pt will be independent with HEP in order to improve strength and balance in order to decrease fall risk and improve function at home and work. Baseline: NT  Goal status: INITIAL  2.  Patient will demonstrate understanding of precautions listed in shoulder bicep tenodesis protocol to improve post surgical outcomes.  Baseline: Able to verbalize precautions for the phase of first four weeks  Goal status: INITIAL   LONG TERM GOALS: Target date: 07/23/2022  (Remove Blue Hyperlink)  Patient will have improved function and activity level as evidenced by an increase in FOTO score by 10 points or more.  Baseline: 30/100 target of 52  Goal status: INITIAL  2.  Patient will regain full AROM of right shoulder that is symmetrical to left shoulder for improved UE function to completing overhead movements to return to gym and repairing cars Baseline: Shoulder Flex R/L 0/160, Shoulder Abduct R/L 0/160  Shoulder Ext R/L 0/5, Shoulder ER R/L NT, Shoulder IR R/L NT      Goal status: INITIAL  3.  Patient will regain right shoulder strength that is symmetrical to left shoulder to return to lifting activities to return to gym and repairing cars.  Baseline: Shoulder Flex R/L 0/5, Shoulder Abd R/L 0/5, Shoulder Ext R/L 0/5, Shoulder Ext R/L NT, Shoulder ER R/L NT, Shoulder IR R/L NT  Goal status: INITIAL  4.  Patient will return to weight lifting activities with ability to perform overhead  resisted activities with right shoulder to return to gym and reengage with fitness activities.   Baseline: NT  Goal status: INITIAL   PLAN: PT FREQUENCY: 1-2x/week  PT DURATION: 10 weeks  PLANNED INTERVENTIONS: Therapeutic exercises, Therapeutic activity, Neuromuscular re-education, Patient/Family education, Self Care, Joint mobilization, Joint manipulation, Dry Needling, Spinal manipulation, Spinal mobilization, Cryotherapy, Moist heat, scar mobilization, Manual therapy, and Re-evaluation  PLAN FOR NEXT SESSION: Assess remainder of shoulder ROM and MMT measurements and begin shoulder and elbow AROM and parascapular strengthening.   Ellin Goodie PT, DPT  05/14/2022, 11:59 AM

## 2022-05-18 ENCOUNTER — Encounter: Payer: 59 | Admitting: Physical Therapy

## 2022-05-20 ENCOUNTER — Encounter: Payer: 59 | Admitting: Physical Therapy

## 2022-05-20 ENCOUNTER — Ambulatory Visit: Payer: 59 | Admitting: Physical Therapy

## 2022-05-25 ENCOUNTER — Encounter: Payer: 59 | Admitting: Physical Therapy

## 2022-05-26 ENCOUNTER — Ambulatory Visit: Payer: 59 | Admitting: Physical Therapy

## 2022-05-27 ENCOUNTER — Encounter: Payer: 59 | Admitting: Physical Therapy

## 2022-06-03 ENCOUNTER — Encounter: Payer: 59 | Admitting: Physical Therapy

## 2022-06-09 ENCOUNTER — Telehealth: Payer: Self-pay | Admitting: Physical Therapy

## 2022-06-09 ENCOUNTER — Ambulatory Visit: Payer: 59 | Admitting: Physical Therapy

## 2022-06-09 NOTE — Telephone Encounter (Signed)
PT called pt to inquiry about missed apt. Pt reports he thought his apt time was at 11:45. PT shared next apt time of September 6th at 9:30 and pt reports he has this time down for his next apt and he will be there.

## 2022-06-10 ENCOUNTER — Encounter: Payer: 59 | Admitting: Physical Therapy

## 2022-06-17 ENCOUNTER — Encounter: Payer: Self-pay | Admitting: Physical Therapy

## 2022-06-17 ENCOUNTER — Ambulatory Visit: Payer: 59 | Attending: Orthopedic Surgery | Admitting: Physical Therapy

## 2022-06-17 DIAGNOSIS — G8929 Other chronic pain: Secondary | ICD-10-CM | POA: Diagnosis present

## 2022-06-17 DIAGNOSIS — M67813 Other specified disorders of tendon, right shoulder: Secondary | ICD-10-CM | POA: Insufficient documentation

## 2022-06-17 DIAGNOSIS — M25511 Pain in right shoulder: Secondary | ICD-10-CM | POA: Insufficient documentation

## 2022-06-17 NOTE — Therapy (Signed)
OUTPATIENT PHYSICAL THERAPY TREATMENT NOTE   Patient Name: Todd [name] MRN: [id number] DOB:July 05, 1966, 56 y.o., male Today's Date: 06/17/2022  PCP: Dr. Leonel Ramsay  REFERRING PROVIDER: Dr. Jarrett Ables   END OF SESSION:   PT End of Session - 06/17/22 0934     Visit Number 2    Number of Visits 20    Date for PT Re-Evaluation 07/23/22    Authorization Type UHC Medicare/Medicaid    PT Start Time 0930    PT Stop Time 1015    PT Time Calculation (min) 45 min    Activity Tolerance Patient tolerated treatment well    Behavior During Therapy [facility] for tasks assessed/performed             Past Medical History:  Diagnosis Date   Anxiety    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   Chronic left shoulder pain 02/15/2016   Depression    Diabetes mellitus without complication ([place])    Dislocation of right thumb 06/05/2016   Diverticulitis    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  Migraines 2-3x/yr.   HTN (hypertension) 05/19/2016   Hypertension    MDD (major depressive disorder), recurrent episode ([place]) 05/28/2016   Neck pain, bilateral 02/15/2016   Primary osteoarthritis of right knee 01/31/2016   and fingers   PTSD (post-traumatic stress disorder) 06/05/2016   Severe recurrent major depression without psychotic features ([place]) 05/19/2016   Sleep apnea    unable to tolerate CPAP   Substance induced mood disorder ([place]) 05/19/2016   Suicidal ideation 05/19/2016   Type 2 diabetes mellitus with complication, without long-term current use of insulin ([place]) 06/08/2017   Uncontrolled type 2 diabetes mellitus with hyperglycemia, without long-term current use of insulin ([place]) 03/12/2017   Vertigo    Past Surgical History:  Procedure Laterality Date   BACK SURGERY  11/209/18 and 10/08/17   x 2   CIRCUMCISION N/A 07/25/2020   Procedure: CIRCUMCISION ADULT;  Surgeon: Hollice Espy, MD;  Location: [facility] ORS;  Service: Urology;  Laterality: N/A;   COLONOSCOPY WITH PROPOFOL N/A 01/31/2018   Procedure: COLONOSCOPY WITH PROPOFOL;  Surgeon: Manya Silvas, MD;  Location: [facility] ENDOSCOPY;  Service: Endoscopy;  Laterality: N/A;   ESOPHAGOGASTRODUODENOSCOPY (EGD) WITH PROPOFOL N/A 01/31/2018   Procedure: ESOPHAGOGASTRODUODENOSCOPY (EGD) WITH PROPOFOL;  Surgeon: Manya Silvas, MD;  Location: [facility] END[facility]COPY;  Service: Endoscopy;  Laterality: N/A;   LEG AMPUTATION BELOW KNEE     left   NASAL SEPTOPLASTY W/ TURBINOPLASTY Bilateral 04/17/2020   Procedure: NASAL SEPTOPLASTY WITH INFERIORTURBINATE REDUCTION;  Surgeon: Carloyn Manner, MD;  Location: [place];  Service: ENT;  Laterality: Bilateral;  Diabetic - oral meds   SHOULDER SURGERY Left    Patient Active Problem List   Diagnosis Date Noted   Obesity (BMI 35.0-39.9 without comorbidity) 12/14/2019   Metatarsalgia of right foot 10/30/2019   Acquired absence of extremity 10/30/2019   Closed fracture of distal end of radius 05/31/2018   Radiculopathy of lumbosacral region 09/30/2017   Spondylolisthesis at L5-S1 level 09/30/2017   Morbid obesity ([place]) 09/06/2017   Hepatic steatosis 08/26/2017   Non-intractable vomiting with nausea 08/26/2017   Upper abdominal pain 08/26/2017   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   Gastroparesis 06/08/2017   History of noncompliance with medical treatment 06/08/2017   Diabetes mellitus type 2 in obese ([place]) 06/08/2017   Uncontrolled type 2  diabetes mellitus with hyperglycemia ([facility]) 03/12/2017   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   Dislocation of right thumb 06/05/2016   Anxiety disorder 06/05/2016   MDD (major depressive disorder), recurrent episode ([facility]) 05/28/2016   Substance induced mood disorder ([facility]) 05/19/2016   Severe recurrent major depression without psychotic features ([facility]) 05/19/2016   Suicidal ideation 05/19/2016   Essential (primary) hypertension 05/19/2016    GERD (gastroesophageal reflux disease) 05/19/2016   Alcohol use disorder, mild, abuse 05/19/2016   Chronic left shoulder pain 02/15/2016   Neck pain, bilateral 02/15/2016   Primary osteoarthritis of right knee 01/31/2016    REFERRING DIAG: S/p right biceps tenodesis    THERAPY DIAG:  Chronic right shoulder pain  Biceps tendonosis of right shoulder  Rationale for Evaluation and Treatment Rehabilitation  PERTINENT HISTORY: Per Dr. Hinda Glatter note on 05/11/22:     Keona Sheffler is a 56 y.o. male status post right biceps tenodesis revision on 05/06/2022.   PLAN: Provided patient with physical therapy referral and post-op rehab protocol. Advised to continue sling use per protocol. Discussed gentle elbow range of motion to prevent stiffness. All questions were answered and he is in agreement with plan.  - Follow-up plan: 4 weeks - X-rays/plan at next visit: none.   PRECAUTIONS: No lifting with affected UE, no loading biceps   SUBJECTIVE: Pt reports that he was concerned about right shoulder   PAIN:  Are you having pain? Yes: NPRS scale: 1-2/10 Pain location: AC joint on right shoulder near full shoulder flexion Pain description: Achy  Aggravating factors: Reaching terminal flexion and abduction ROM  Relieving factors: Not moving shoulder    OBJECTIVE: (objective measures completed at initial evaluation unless otherwise dated)              VITALS: BP 138/80 HR 77 SpO2 98   DIAGNOSTIC FINDINGS:      EXAM: XR SHOULDER 3 OR MORE VIEWS RIGHT  DATE: 03/13/2022 10:49 AM  ACCESSION: [id number] UN  DICTATED: 03/13/2022 11:03 AM  INTERPRETATION LOCATION: [facility]   CLINICAL INDICATION: 56 years old Male with evaluate shoulder pain/previous repair  - M25.511 - Acute pain of right shoulder        COMPARISON: MRI dated 05/12/2021   TECHNIQUE: AP, Grashey, outlet, and axillary views of the right shoulder.   FINDINGS:  No acute fractures. No malalignment. Sequelae of distal  clavicle resection. Glenohumeral joint space is preserved with small glenoid and inferior humeral head osteophyte. Peripherally sclerotic and circumscribed lucent lesion within the proximal humerus measuring approximately 6.2 x 2.3 cm without endosteal scalloping, cortical breakthrough or periosteal reaction. Irregularity along the greater tuberosity with small adjacent osseous bodies compatible with rotator cuff tendinopathy. Partially imaged lungs are clear.    ///////////////////////////////////////////////////////////////////////////////////////////////////////////////////////////////////////////   Joseph Art, MD - 03/13/2022  Formatting of this note might be different from the original.  EXAM: XR SHOULDER 3 OR MORE VIEWS RIGHT  DATE: 03/13/2022 10:49 AM  ACCESSION: [id number] UN  DICTATED: 03/13/2022 11:03 AM  INTERPRETATION LOCATION: [facility]   CLINICAL INDICATION: 56 years old Male with evaluate shoulder pain/previous repair  - M25.511 - Acute pain of right shoulder     COMPARISON: MRI dated 05/12/2021   TECHNIQUE: AP, Grashey, outlet, and axillary views of the right shoulder.   FINDINGS:  No acute fractures. No malalignment. Sequelae of distal clavicle resection. Glenohumeral joint space is preserved with small glenoid and inferior humeral head osteophyte. Peripherally sclerotic and circumscribed lucent lesion within the proximal humerus measuring approximately 6.2 x 2.3  cm without endosteal scalloping, cortical breakthrough or periosteal reaction. Irregularity along the greater tuberosity with small adjacent osseous bodies compatible with rotator cuff tendinopathy. Partially imaged lungs are clear.   IMPRESSION:  No acute fracture or dislocation.   Redemonstrated peripherally sclerotic proximal humerus lesion compatible with a nonaggressive osseous lesion. This is better characterized on prior MRI dated 05/12/2021   PATIENT SURVEYS:  FOTO 30/100   COGNITION:            Overall cognitive status: Within functional limits for tasks assessed                                  SENSATION: WFL   POSTURE: Rounded shoulders    UPPER EXTREMITY ROM:       Active ROM Right eval Left eval  Shoulder flexion 0 180  Shoulder extension 0 60  Shoulder abduction 180  180  Shoulder adduction      Shoulder internal rotation 70 70  Shoulder external rotation 90 90  Elbow flexion 150 150  Elbow extension 60 60  Wrist flexion 80 80  Wrist extension 70 70  Wrist ulnar deviation 30 30  Wrist radial deviation 20 20  Wrist pronation 80 80  Wrist supination 80 80  (Blank rows = not tested)          UPPER EXTREMITY MMT:   MMT Right eval Left eval  Shoulder flexion NT 5  Shoulder extension NT  5  Shoulder abduction NT  5  Shoulder adduction      Shoulder internal rotation      Shoulder external rotation      Middle trapezius   NT  Lower trapezius   NT   Elbow flexion   5  Elbow extension   5  Wrist flexion   4+*  Wrist extension   4+*  Wrist ulnar deviation      Wrist radial deviation      Wrist pronation      Wrist supination      Grip strength (lbs)   Fair*  (Blank rows = not tested)   Patient had recent carpal tunnel surgery on both wris   SHOULDER SPECIAL TESTS:  Not applicable because s/p right biceps tendesis 05/06/22   JOINT MOBILITY TESTING:  N/a   PALPATION:  Anterior surface of right shoulder              TODAY'S TREATMENT:   06/17/22:          Shoulder Flexion/Extension AAROM 3 x 10           Shoulder Abduction/Adduction AAROM 3 X 10           Right Shoulder ER Isometrics 5 sec x 3 x 10          Right Shoulder Flexion Isometrics 5 sec x 3 x 10           Right Shoulder Abduction Isometrics 5 x 3 x 10                    Initial:  Scapular Retraction 2 x 10  RUE ball squeezes  1 x 10    PATIENT EDUCATION: Education details: Form and technique for appropriate exercise.  Person educated: Patient Education method:  Explanation, Demonstration, Verbal cues, and Handouts Education comprehension: verbalized understanding, returned demonstration, and verbal cues required  Educated patient on rehab protocol and precautions for post-op phase : [email or web address]     HOME EXERCISE PROGRAM: Access Code: [email or web address] URL: [email or web address] Date: 06/17/2022 Prepared by: Bradly Chris  Exercises - Seated Shoulder Abduction AAROM with Pulley Behind  - 1 x daily - 7 x weekly - 3 sets - 10 reps - Seated Shoulder Flexion AAROM with Pulley Behind  - 1 x daily - 7 x weekly - 3 sets - 10 reps - Seated Scapular Retraction  - 1 x daily - 3 x weekly - 3 sets - 10 reps - Isometric Shoulder Flexion at Wall  - 3 x weekly - 3 sets - 10 reps - 5 hold - Isometric Shoulder External Rotation at Wall  - 3 x weekly - 3 sets - 10 reps - 5 hold - Isometric Shoulder Extension at Wall  - 3 x weekly - 3 sets - 10 reps - 5 hold - Standing Isometric Shoulder Internal Rotation at Doorway  - 3 x weekly - 3 sets - 10 reps - 5 hold - Standing Isometric Shoulder Abduction with Doorway - Arm Bent  - 3 x weekly - 3 sets - 10 reps - 5 hold   ASSESSMENT:   CLINICAL IMPRESSION: Pt presents for f/u s/p 6 weeks for right biceps tenodesis. Pt shows an improvement in R shoulder AROM and strength with ability to move right shoulder through full ROM and perform isometrics. He is close to completing all goals for phase 2 of rehab protocol.  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   OBJECTIVE IMPAIRMENTS decreased ROM, decreased strength, impaired UE functional use, and pain.    ACTIVITY LIMITATIONS carrying, lifting, dressing, self feeding, reach over head, and hygiene/grooming    PARTICIPATION LIMITATIONS: meal prep, cleaning, shopping, occupation, and yard work   PERSONAL FACTORS 3+ comorbidities: T2DM, PTSD, Sleep apnea  are also affecting patient's functional outcome.    REHAB POTENTIAL: Good   CLINICAL DECISION MAKING: Stable/uncomplicated   EVALUATION COMPLEXITY: Low     GOALS: Goals reviewed with patient? No   SHORT TERM GOALS: Target date: 05/28/2022  (Remove Blue Hyperlink)   Pt will be independent with HEP in order to improve strength and balance in order to decrease fall risk and improve function at home and work. Baseline: NT  Goal status: INITIAL   2.  Patient will demonstrate understanding of precautions listed in shoulder bicep tenodesis protocol to improve post surgical outcomes.  Baseline: Able to verbalize precautions for the phase of first four weeks  Goal status: INITIAL     LONG TERM GOALS: Target date: 07/23/2022  (Remove Blue Hyperlink)   Patient will have improved function and activity level as evidenced by an increase in FOTO score by 10 points or more.  Baseline: 30/100 target of 52  Goal status: INITIAL   2.  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  Patient will regain right shoulder strength that is symmetrical to left shoulder to return to lifting activities to return to gym and repairing cars.  Baseline: Shoulder Flex R/L  0/5, Shoulder Abd R/L 0/5, Shoulder Ext R/L 0/5, Shoulder Ext R/L NT, Shoulder ER R/L NT, Shoulder IR R/L NT    Goal status: INITIAL   4.  Patient will return to weight lifting activities with ability to perform overhead resisted activities with right shoulder to return to gym and reengage with fitness activities.    Baseline: NT  Goal status: INITIAL     PLAN: PT FREQUENCY: 1-2x/week   PT DURATION: 10 weeks   PLANNED INTERVENTIONS: Therapeutic exercises, Therapeutic activity, Neuromuscular re-education, Patient/Family education, Self Care, Joint mobilization, Joint manipulation, Dry Needling, Spinal manipulation, Spinal mobilization, Cryotherapy, Moist heat, scar mobilization, Manual therapy, and Re-evaluation   PLAN FOR NEXT SESSION: Assess remainder of shoulder ROM and MMT measurements. Progress shoulder ROM ER and IR exercises   Bradly Chris PT, DPT  06/17/2022, 9:35 AM

## 2022-06-22 ENCOUNTER — Ambulatory Visit: Payer: 59 | Admitting: Physical Therapy

## 2022-06-22 ENCOUNTER — Telehealth: Payer: Self-pay | Admitting: Physical Therapy

## 2022-06-22 NOTE — Telephone Encounter (Signed)
Called pt to inquire about absence from apt. Pt reports that he informed PT and front desk about need to miss apt last time because of several doctor's appointments ahead of time. He will be at next appointment.

## 2022-06-24 ENCOUNTER — Ambulatory Visit: Payer: 59 | Admitting: Physical Therapy

## 2022-06-24 ENCOUNTER — Encounter: Payer: Self-pay | Admitting: Physical Therapy

## 2022-06-24 DIAGNOSIS — M25511 Pain in right shoulder: Secondary | ICD-10-CM | POA: Diagnosis not present

## 2022-06-24 DIAGNOSIS — G8929 Other chronic pain: Secondary | ICD-10-CM

## 2022-06-24 DIAGNOSIS — M67813 Other specified disorders of tendon, right shoulder: Secondary | ICD-10-CM

## 2022-06-24 NOTE — Therapy (Signed)
OUTPATIENT PHYSICAL THERAPY TREATMENT NOTE   Patient Name: Todd [name] Case MRN: [id number] DOB:01/31/1966, 56 y.o., male Today's Date: 06/24/2022  PCP: Dr. Celedonio MiyamotoAndrian Mancheno  REFERRING PROVIDER: Dr. Stormy Cardobert Creighton   END OF SESSION:   PT End of Session - 06/24/22 0938     Visit Number 3    Number of Visits 20    Date for PT Re-Evaluation 07/23/22    Authorization Type [employer] Medicare/Medicaid    PT Start Time 0935    PT Stop Time 1015    PT Time Calculation (min) 40 min    Activity Tolerance Patient tolerated treatment well    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  Migraines 2-3x/yr.   HTN (hypertension) 05/19/2016   Hypertension    MDD (major depressive disorder), recurrent episode (HCC) 05/28/2016   Neck pain, bilateral 02/15/2016   Primary osteoarthritis of right knee 01/31/2016   and fingers   PTSD (post-traumatic stress disorder) 06/05/2016   Severe recurrent major depression without psychotic features (HCC) 05/19/2016   Sleep apnea    unable to tolerate CPAP   Substance induced mood disorder (HCC) 05/19/2016   Suicidal ideation 05/19/2016   Type 2 diabetes mellitus with complication, without long-term current use of insulin (HCC) 06/08/2017   Uncontrolled type 2 diabetes mellitus with hyperglycemia, without long-term current use of insulin (HCC) 03/12/2017   Vertigo    Past Surgical History:  Procedure Laterality Date   BACK SURGERY  11/209/18 and 10/08/17   x 2   CIRCUMCISION N/A 07/25/2020   Procedure: CIRCUMCISION ADULT;  Surgeon: Vanna ScotlandBrandon,  Ashley, MD;  Location: [facility];  Service: Urology;  Laterality: N/A;   COLONOSCOPY WITH PROPOFOL N/A 01/31/2018   Procedure: COLONOSCOPY WITH PROPOFOL;  Surgeon: Scot JunElliott, Robert T, MD;  [facility];  Service: Endoscopy;  Laterality: N/A;   ESOPHAGOGASTRODUODENOSCOPY (EGD) WITH PROPOFOL N/A 01/31/2018   Procedure: ESOPHAGOGASTRODUODENOSCOPY (EGD) WITH PROPOFOL;  Surgeon: Scot JunElliott, Robert T, MD;  [facility];  Service: Endoscopy;  Laterality: N/A;   LEG AMPUTATION BELOW KNEE     left   NASAL SEPTOPLASTY W/ TURBINOPLASTY Bilateral 04/17/2020   Procedure: NASAL SEPTOPLASTY WITH INFERIORTURBINATE REDUCTION;  Surgeon: Bud FaceVaught, Creighton, MD;  [facility];  Service: ENT;  Laterality: Bilateral;  Diabetic - oral meds   SHOULDER SURGERY Left    Patient Active Problem List   Diagnosis Date Noted   Obesity (BMI 35.0-39.9 without comorbidity) 12/14/2019   Metatarsalgia of right foot 10/30/2019   Acquired absence of extremity 10/30/2019   Closed fracture of distal end of radius 05/31/2018   Radiculopathy of lumbosacral region 09/30/2017   Spondylolisthesis at L5-S1 level 09/30/2017   Morbid obesity (HCC) 09/06/2017   Hepatic steatosis 08/26/2017   Non-intractable vomiting with nausea 08/26/2017   Upper abdominal pain 08/26/2017   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   Gastroparesis 06/08/2017   History of noncompliance with medical treatment 06/08/2017   Diabetes mellitus type 2 in obese (HCC) 06/08/2017   Uncontrolled type 2  diabetes mellitus with hyperglycemia (HCC) 03/12/2017   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   Dislocation of right thumb 06/05/2016   Anxiety disorder 06/05/2016   MDD (major depressive disorder), recurrent episode (HCC) 05/28/2016   Substance induced mood disorder (HCC) 05/19/2016   Severe recurrent major depression without psychotic features (HCC) 05/19/2016   Suicidal ideation 05/19/2016   Essential (primary) hypertension 05/19/2016    GERD (gastroesophageal reflux disease) 05/19/2016   Alcohol use disorder, mild, abuse 05/19/2016   Chronic left shoulder pain 02/15/2016   Neck pain, bilateral 02/15/2016   Primary osteoarthritis of right knee 01/31/2016    REFERRING DIAG: S/p right biceps tenodesis    THERAPY DIAG:  Chronic right shoulder pain  Biceps tendonosis of right shoulder  Rationale for Evaluation and Treatment Rehabilitation  PERTINENT HISTORY: Per Dr. Hinda Glatter note on 05/11/22:     Todd [name] is a 56 y.o. male status post right biceps tenodesis revision on 05/06/2022.   PLAN: Provided patient with physical therapy referral and post-op rehab protocol. Advised to continue sling use per protocol. Discussed gentle elbow range of motion to prevent stiffness. All questions were answered and he is in agreement with plan.  - Follow-up plan: 4 weeks - X-rays/plan at next visit: none.   PRECAUTIONS: No lifting with affected UE, no loading biceps   SUBJECTIVE: Pt reports that his right shoulder PROM has improved since last session and he only occasionally feels biceps pain.   PAIN:  Are you having pain? Yes: NPRS scale: 1-2/10 Pain location: AC joint on right shoulder near full shoulder flexion Pain description: Achy  Aggravating factors: Reaching terminal flexion and abduction ROM  Relieving factors: Not moving shoulder    OBJECTIVE: (objective measures completed at initial evaluation unless otherwise dated)              VITALS: BP 138/80 HR 77 SpO2 98   DIAGNOSTIC FINDINGS:      EXAM: XR SHOULDER 3 OR MORE VIEWS RIGHT  DATE: 03/13/2022 10:49 AM  ACCESSION: [id number]  DICTATED: 03/13/2022 11:03 AM  [facility]PRET[facility] LOCATION: [facility]   CLINICAL INDICATION: 56 years old Male with evaluate shoulder pain/previous repair  - M25.511 - Acute pain of right shoulder        COMPARISON: MRI dated 05/12/2021   TECHNIQUE: AP, Grashey, outlet, and axillary views of the right shoulder.   FINDINGS:   No acute fractures. No malalignment. Sequelae of distal clavicle resection. Glenohumeral joint space is preserved with small glenoid and inferior humeral head osteophyte. Peripherally sclerotic and circumscribed lucent lesion within the proximal humerus measuring approximately 6.2 x 2.3 cm without endosteal scalloping, cortical breakthrough or periosteal reaction. Irregularity along the greater tuberosity with small adjacent osseous bodies compatible with rotator cuff tendinopathy. Partially imaged lungs are clear.    ///////////////////////////////////////////////////////////////////////////////////////////////////////////////////////////////////////////   Joseph Art, MD - 03/13/2022  Formatting of this note might be different from the original.  EXAM: XR SHOULDER 3 OR MORE VIEWS RIGHT  DATE: 03/13/2022 10:49 AM  ACCESSION: [id number]  DICTATED: 03/13/2022 11:03 AM  INTERPRETATION LOCATION: [facility]   CLINICAL INDICATION: 56 years old Male with evaluate shoulder pain/previous repair  - M25.511 - Acute pain of right shoulder     COMPARISON: MRI dated 05/12/2021   TECHNIQUE: AP, Grashey, outlet, and axillary views of the right shoulder.   FINDINGS:  No acute fractures. No malalignment. Sequelae of distal clavicle resection. Glenohumeral joint space is preserved with small glenoid and inferior humeral head osteophyte. Peripherally sclerotic and circumscribed lucent  lesion within the proximal humerus measuring approximately 6.2 x 2.3 cm without endosteal scalloping, cortical breakthrough or periosteal reaction. Irregularity along the greater tuberosity with small adjacent osseous bodies compatible with rotator cuff tendinopathy. Partially imaged lungs are clear.   IMPRESSION:  No acute fracture or dislocation.   Redemonstrated peripherally sclerotic proximal humerus lesion compatible with a nonaggressive osseous lesion. This is better characterized on prior MRI dated 05/12/2021    PATIENT SURVEYS:  FOTO 30/100   COGNITION:           Overall cognitive status: Within functional limits for tasks assessed                                  SENSATION: WFL   POSTURE: Rounded shoulders    UPPER EXTREMITY ROM:       Active ROM Right eval Left eval  Shoulder flexion 0 180  Shoulder extension 0 60  Shoulder abduction 180  180  Shoulder adduction      Shoulder internal rotation 70 70  Shoulder external rotation 90 90  Elbow flexion 150 150  Elbow extension 60 60  Wrist flexion 80 80  Wrist extension 70 70  Wrist ulnar deviation 30 30  Wrist radial deviation 20 20  Wrist pronation 80 80  Wrist supination 80 80  (Blank rows = not tested)          UPPER EXTREMITY MMT:   MMT Right eval Left eval  Shoulder flexion NT 5  Shoulder extension NT  5  Shoulder abduction NT  5  Shoulder adduction      Shoulder internal rotation      Shoulder external rotation      Middle trapezius   NT  Lower trapezius   NT   Elbow flexion   5  Elbow extension   5  Wrist flexion   4+*  Wrist extension   4+*  Wrist ulnar deviation      Wrist radial deviation      Wrist pronation      Wrist supination      Grip strength (lbs)   Fair*  (Blank rows = not tested)   Patient had recent carpal tunnel surgery on both wris   SHOULDER SPECIAL TESTS:  Not applicable because s/p right biceps tendesis 05/06/22   JOINT MOBILITY TESTING:  N/a   PALPATION:  Anterior surface of right shoulder              TODAY'S TREATMENT:   06/24/22:            Performed all RUE           Shoulder Flexion/Extension AAROM 3 x 10           Shoulder Abduction/Adduction AAROM 3 X 10           Shoulder Flexion Wall Walks 3 x 10           Shoulder Abduction Wall Walks 3 x 10            06/17/22:          Shoulder Flexion/Extension AAROM 3 x 10           Shoulder Abduction/Adduction AAROM 3 X 10           Right Shoulder ER Isometrics 5 sec x 3 x 10          Right Shoulder Flexion Isometrics  5 sec x  3 x 10           Right Shoulder Abduction Isometrics 5 x 3 x 10                    Initial:  Scapular Retraction 2 x 10  RUE ball squeezes  1 x 10    PATIENT EDUCATION: Education details: Form and technique for appropriate exercise.  Person educated: Patient Education method: Explanation, Demonstration, Verbal cues, and Handouts Education comprehension: verbalized understanding, returned demonstration, and verbal cues required              Educated patient on rehab protocol and precautions for post-op phase : [email or web address]     HOME EXERCISE PROGRAM: Access Code: [email or web address] URL: [email or web address] Date: 06/24/2022 Prepared by: Ellin Goodie  Exercises - Seated Shoulder Abduction AAROM with Pulley Behind  - 1 x daily - 7 x weekly - 3 sets - 10 reps - Seated Shoulder Flexion AAROM with Pulley Behind  - 1 x daily - 7 x weekly - 3 sets - 10 reps - Seated Scapular Retraction  - 1 x daily - 3 x weekly - 3 sets - 10 reps - Isometric Shoulder Flexion at Wall  - 3 x weekly - 3 sets - 10 reps - 5 hold - Isometric Shoulder External Rotation at Wall  - 3 x weekly - 3 sets - 10 reps - 5 hold - Isometric Shoulder Extension at Wall  - 3 x weekly - 3 sets - 10 reps - 5 hold - Standing Isometric Shoulder Internal Rotation at Doorway  - 3 x weekly - 3 sets - 10 reps - 5 hold - Standing Isometric Shoulder Abduction with Doorway - Arm Bent  - 3 x weekly - 3 sets - 10 reps - 5 hold - Wall Push Up with Plus  - 3 x weekly - 3 sets - 15 reps - Sleeper Stretch  - 7 x weekly - 3 reps - 30 hold   ASSESSMENT:   CLINICAL IMPRESSION:  Pt presents for f/u s/p 7 weeks for right biceps tenodesis. He exhibits full AROM without pain and he has now achieved all rehab goals for the 4-6 week AROM and Motion phase of rehab. Exercises kept to close chained and still  strengthening still at isometric. Next session to initiate open chain and strengthening through right shoulder ROM within pain free ROM. 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  OBJECTIVE IMPAIRMENTS decreased ROM, decreased strength, impaired UE functional use, and pain.    ACTIVITY LIMITATIONS carrying, lifting, dressing, self feeding, reach over head, and hygiene/grooming   PARTICIPATION LIMITATIONS: meal prep, cleaning, shopping, occupation, and yard work   PERSONAL FACTORS 3+ comorbidities: T2DM, PTSD, Sleep apnea  are also affecting patient's functional outcome.    REHAB POTENTIAL: Good   CLINICAL DECISION MAKING: Stable/uncomplicated   EVALUATION COMPLEXITY: Low     GOALS: Goals reviewed with patient? No   SHORT TERM GOALS: Target date: 05/28/2022  (Remove Blue Hyperlink)   Pt will be independent with HEP in order to improve strength and balance in order to decrease fall risk and improve function at home and work. Baseline: NT  Goal status: INITIAL   2.  Patient will demonstrate understanding of precautions listed in shoulder bicep tenodesis protocol to improve post surgical outcomes.  Baseline: Able to verbalize precautions for the phase of  first four weeks  Goal status: INITIAL     LONG TERM GOALS: Target date: 07/23/2022  (Remove Blue Hyperlink)   Patient will have improved function and activity level as evidenced by an increase in FOTO score by 10 points or more.  Baseline: 30/100 target of 52  Goal status: INITIAL   2.  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  Patient will regain right shoulder strength that is symmetrical to left shoulder to return to lifting activities to return to gym and repairing cars.  Baseline: Shoulder Flex R/L 0/5, Shoulder Abd R/L 0/5, Shoulder Ext R/L 0/5, Shoulder Ext R/L NT, Shoulder ER R/L NT, Shoulder IR R/L NT    Goal status: INITIAL   4.  Patient will return to weight lifting activities with ability to perform overhead resisted activities with right shoulder to return to gym and reengage with fitness activities.   Baseline: NT  Goal status: INITIAL     PLAN: PT FREQUENCY: 1-2x/week   PT DURATION: 10 weeks   PLANNED INTERVENTIONS: Therapeutic exercises, Therapeutic activity, Neuromuscular re-education, Patient/Family education, Self Care, Joint mobilization, Joint manipulation, Dry Needling, Spinal manipulation, Spinal mobilization, Cryotherapy, Moist heat, scar mobilization, Manual therapy, and Re-evaluation   PLAN FOR NEXT SESSION: Assess remainder of shoulder ROM and MMT measurements. Begin strengthening exercises for right shoulder and progress parascapular strengthening exercises.   Ellin Goodie PT, DPT  06/24/2022, 9:39 AM

## 2022-06-29 ENCOUNTER — Ambulatory Visit: Payer: 59 | Admitting: Physical Therapy

## 2022-06-29 DIAGNOSIS — G8929 Other chronic pain: Secondary | ICD-10-CM

## 2022-06-29 DIAGNOSIS — M67813 Other specified disorders of tendon, right shoulder: Secondary | ICD-10-CM

## 2022-06-29 DIAGNOSIS — M25511 Pain in right shoulder: Secondary | ICD-10-CM | POA: Diagnosis not present

## 2022-06-29 NOTE — Therapy (Signed)
OUTPATIENT PHYSICAL THERAPY TREATMENT NOTE   Patient Name: Todd [name] MRN: [id number] DOB:May 11, 1966, 56 y.o., male Today's Date: 06/29/2022  PCP: Dr. Leonel Ramsay  REFERRING PROVIDER: Dr. Jarrett Ables   END OF SESSION:   PT End of Session - 06/29/22 1113     Visit Number 4    Number of Visits 20    Date for PT Re-Evaluation 07/23/22    Authorization Type UHC Medicare/Medicaid    PT Start Time 0935    PT Stop Time 1015    PT Time Calculation (min) 40 min    Activity Tolerance Patient tolerated treatment well    Behavior During Therapy [facility] for tasks assessed/performed              Past Medical History:  Diagnosis Date   Anxiety    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   Chronic left shoulder pain 02/15/2016   Depression    Diabetes mellitus without complication ([place])    Dislocation of right thumb 06/05/2016   Diverticulitis    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  Migraines 2-3x/yr.   HTN (hypertension) 05/19/2016   Hypertension    MDD (major depressive disorder), recurrent episode ([place]) 05/28/2016   Neck pain, bilateral 02/15/2016   Primary osteoarthritis of right knee 01/31/2016   and fingers   PTSD (post-traumatic stress disorder) 06/05/2016   Severe recurrent major depression without psychotic features ([place]) 05/19/2016   Sleep apnea    unable to tolerate CPAP   Substance induced mood disorder ([place]) 05/19/2016   Suicidal ideation 05/19/2016   Type 2 diabetes mellitus with complication, without long-term current use of insulin ([place]) 06/08/2017   Uncontrolled type 2 diabetes mellitus with hyperglycemia, without long-term current use of insulin ([place]) 03/12/2017   Vertigo    Past Surgical History:  Procedure Laterality Date   BACK SURGERY  11/209/18 and 10/08/17   x 2   CIRCUMCISION N/A 07/25/2020   Procedure: CIRCUMCISION ADULT;  Surgeon: Hollice Espy, MD;  Location: [facility] ORS;  Service: Urology;  Laterality: N/A;   COLONOSCOPY WITH PROPOFOL N/A 01/31/2018   Procedure: COLONOSCOPY WITH PROPOFOL;  Surgeon: Manya Silvas, MD;  Location: [facility] ENDOSCOPY;  Service: Endoscopy;  Laterality: N/A;   ESOPHAGOGASTRODUODENOSCOPY (EGD) WITH PROPOFOL N/A 01/31/2018   Procedure: ESOPHAGOGASTRODUODENOSCOPY (EGD) WITH PROPOFOL;  Surgeon: Manya Silvas, MD;  Location: [facility] ENDOSCOPY;  Service: Endoscopy;  Laterality: N/A;   LEG AMPUTATION BELOW KNEE     left   NASAL SEPTOPLASTY W/ TURBINOPLASTY Bilateral 04/17/2020   Procedure: NASAL SEPTOPLASTY WITH INFERIORTURBINATE REDUCTION;  Surgeon: Carloyn Manner, MD;  Location: [place];  Service: ENT;  Laterality: Bilateral;  Diabetic - oral meds   SHOULDER SURGERY Left    Patient Active Problem List   Diagnosis Date Noted   Obesity (BMI 35.0-39.9 without comorbidity) 12/14/2019   Metatarsalgia of right foot 10/30/2019   Acquired absence of extremity 10/30/2019   Closed fracture of distal end of radius 05/31/2018   Radiculopathy of lumbosacral region 09/30/2017   Spondylolisthesis at L5-S1 level 09/30/2017   Morbid obesity ([place]) 09/06/2017   Hepatic steatosis 08/26/2017   Non-intractable vomiting with nausea 08/26/2017   Upper abdominal pain 08/26/2017   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   Gastroparesis 06/08/2017   History of noncompliance with medical treatment 06/08/2017   Diabetes mellitus type 2 in obese ([place]) 06/08/2017   Uncontrolled type  2 diabetes mellitus with hyperglycemia ([place]) 03/12/2017   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   Dislocation of right thumb 06/05/2016   Anxiety disorder 06/05/2016   MDD (major depressive disorder), recurrent episode ([place]) 05/28/2016   Substance induced mood disorder ([place]) 05/19/2016   Severe recurrent major depression without psychotic features ([place]) 05/19/2016   Suicidal ideation 05/19/2016   Essential (primary) hypertension 05/19/2016    GERD (gastroesophageal reflux disease) 05/19/2016   Alcohol use disorder, mild, abuse 05/19/2016   Chronic left shoulder pain 02/15/2016   Neck pain, bilateral 02/15/2016   Primary osteoarthritis of right knee 01/31/2016    REFERRING DIAG: S/p right biceps tenodesis    THERAPY DIAG:  Chronic right shoulder pain  Biceps tendonosis of right shoulder  Rationale for Evaluation and Treatment Rehabilitation  PERTINENT HISTORY: Per Dr. Shelbie Ammons note on 05/11/22:     Todd [name] is a 56 y.o. male status post right biceps tenodesis revision on 05/06/2022.   PLAN: Provided patient with physical therapy referral and post-op rehab protocol. Advised to continue sling use per protocol. Discussed gentle elbow range of motion to prevent stiffness. All questions were answered and he is in agreement with plan.  - Follow-up plan: 4 weeks - X-rays/plan at next visit: none.   PRECAUTIONS: No lifting with affected UE, no loading biceps   SUBJECTIVE: Patient reports that he is feeling increased pain in his right biceps and he is worried why he is feeling this pain.   PAIN:  Are you having pain? Yes: NPRS scale: 1-2/10 Pain location: AC joint on right shoulder near full shoulder flexion Pain description: Achy  Aggravating factors: Reaching terminal flexion and abduction ROM  Relieving factors: Not moving shoulder    OBJECTIVE: (objective measures completed at initial evaluation unless otherwise dated)              VITALS: BP 138/80 HR 77 SpO2 98   DIAGNOSTIC FINDINGS:      EXAM: XR SHOULDER 3 OR MORE VIEWS RIGHT  DATE: 03/13/2022 10:49 AM  ACCESSION: [id number] UN  DICTATED: 03/13/2022 11:03 AM  INTERPRETATION LOCATION: [place]   CLINICAL INDICATION: 56 years old Male with evaluate shoulder pain/previous repair  - M25.511 - Acute pain of right shoulder        COMPARISON: MRI dated 05/12/2021   TECHNIQUE: AP, Grashey, outlet, and axillary views of the right shoulder.   FINDINGS:   No acute fractures. No malalignment. Sequelae of distal clavicle resection. Glenohumeral joint space is preserved with small glenoid and inferior humeral head osteophyte. Peripherally sclerotic and circumscribed lucent lesion within the proximal humerus measuring approximately 6.2 x 2.3 cm without endosteal scalloping, cortical breakthrough or periosteal reaction. Irregularity along the greater tuberosity with small adjacent osseous bodies compatible with rotator cuff tendinopathy. Partially imaged lungs are clear.    ///////////////////////////////////////////////////////////////////////////////////////////////////////////////////////////////////////////   Christianne Dolin, MD - 03/13/2022  Formatting of this note might be different from the original.  EXAM: XR SHOULDER 3 OR MORE VIEWS RIGHT  DATE: 03/13/2022 10:49 AM  ACCESSION: [id number]  DICTATED: 03/13/2022 11:03 AM  INTERPRETATION LOCATION: [place]   CLINICAL INDICATION: 56 years old Male with evaluate shoulder pain/previous repair  - M25.511 - Acute pain of right shoulder     COMPARISON: MRI dated 05/12/2021   TECHNIQUE: AP, Grashey, outlet, and axillary views of the right shoulder.   FINDINGS:  No acute fractures. No malalignment. Sequelae of distal clavicle resection. Glenohumeral joint space is preserved with small glenoid and inferior humeral head osteophyte. Peripherally  sclerotic and circumscribed lucent lesion within the proximal humerus measuring approximately 6.2 x 2.3 cm without endosteal scalloping, cortical breakthrough or periosteal reaction. Irregularity along the greater tuberosity with small adjacent osseous bodies compatible with rotator cuff tendinopathy. Partially imaged lungs are clear.   IMPRESSION:  No acute fracture or dislocation.   Redemonstrated peripherally sclerotic proximal humerus lesion compatible with a nonaggressive osseous lesion. This is better characterized on prior MRI dated 05/12/2021    PATIENT SURVEYS:  FOTO 30/100   COGNITION:           Overall cognitive status: Within functional limits for tasks assessed                                  SENSATION: WFL   POSTURE: Rounded shoulders    UPPER EXTREMITY ROM:       Active ROM Right eval Left eval  Shoulder flexion 0 180  Shoulder extension 0 60  Shoulder abduction 180  180  Shoulder adduction      Shoulder internal rotation 70 70  Shoulder external rotation 90 90  Elbow flexion 150 150  Elbow extension 60 60  Wrist flexion 80 80  Wrist extension 70 70  Wrist ulnar deviation 30 30  Wrist radial deviation 20 20  Wrist pronation 80 80  Wrist supination 80 80  (Blank rows = not tested)          UPPER EXTREMITY MMT:   MMT Right eval Left eval 06/29/22  Shoulder flexion NT 5 5  Shoulder extension NT  5 5  Shoulder abduction NT  5 5  Shoulder adduction       Shoulder internal rotation     5  Shoulder external rotation     5  Middle trapezius   NT   Lower trapezius   NT    Elbow flexion   5   Elbow extension   5   Wrist flexion   4+*   Wrist extension   4+*   Wrist ulnar deviation       Wrist radial deviation       Wrist pronation       Wrist supination       Grip strength (lbs)   Fair*   (Blank rows = not tested)   Patient had recent carpal tunnel surgery on both wris   SHOULDER SPECIAL TESTS:  Not applicable because s/p right biceps tendesis 05/06/22   JOINT MOBILITY TESTING:  N/a   PALPATION:  Anterior surface of right shoulder              TODAY'S TREATMENT:   06/29/22:                    UBE seat at 8 for 6 min                     Shoulder MMT see above                     Shoulder Flexion LUE #2 DB 1 x 30                     Shoulder Abduction LUE #2 DB 1 x 30                       -Pt needed to stop after 20  Shoulder Biceps LUE #2 DB  1x 30                     Shoulder Tricpes LUE #2 DB  1x 30     06/24/22:            Performed all RUE            Shoulder Flexion/Extension AAROM 3 x 10           Shoulder Abduction/Adduction AAROM 3 X 10           Shoulder Flexion Wall Walks 3 x 10           Shoulder Abduction Wall Walks 3 x 10            06/17/22:          Shoulder Flexion/Extension AAROM 3 x 10           Shoulder Abduction/Adduction AAROM 3 X 10           Right Shoulder ER Isometrics 5 sec x 3 x 10          Right Shoulder Flexion Isometrics 5 sec x 3 x 10           Right Shoulder Abduction Isometrics 5 x 3 x 10                    Initial:  Scapular Retraction 2 x 10  RUE ball squeezes  1 x 10    PATIENT EDUCATION: Education details: Form and technique for appropriate exercise.  Person educated: Patient Education method: Explanation, Demonstration, Verbal cues, and Handouts Education comprehension: verbalized understanding, returned demonstration, and verbal cues required              Educated patient on rehab protocol and precautions for post-op phase : [email or web address]     HOME EXERCISE PROGRAM: Access Code: [email or web address] URL: [email or web address] Date: 06/29/2022 Prepared by: Bradly Chris  Exercises - Sleeper Stretch  - 7 x weekly - 3 reps - 30 hold - Wall Push Up with Plus  - 3 x weekly - 3 sets - 15 reps - Single Arm Shoulder Flexion with Dumbbell  - 1 x daily - 3 x weekly - 1 sets - 30 reps - Shoulder Abduction with Dumbbells - Thumbs Up  - 1 x daily - 3 x weekly - 1 sets - 30 reps - Bicep Curl to Shoulder Press with Dumbbells  - 1 x daily - 3 x weekly - 30 reps - Seated Overhead Elbow Extension  - 1 x daily - 3 x weekly - 30 reps   ASSESSMENT:   CLINICAL IMPRESSION:  Pt presents for f/u s/p 8 weeks for right biceps tenodesis. He exhibits an improvement in right shoulder strength with ability to perform resisted shoulder flexion abduction and biceps and triceps strengthening  at a low load for a high number of repetitions without an increase in his pain. Next session to focus on parascapular strengthening and rotator cuff strengthening exercises. 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  OBJECTIVE IMPAIRMENTS decreased ROM, decreased strength, impaired UE functional use, and pain.    ACTIVITY LIMITATIONS carrying, lifting, dressing, self feeding, reach over head, and hygiene/grooming   PARTICIPATION LIMITATIONS: meal prep, cleaning, shopping, occupation, and yard work   PERSONAL FACTORS 3+ comorbidities: T2DM, PTSD, Sleep apnea  are  also affecting patient's functional outcome.    REHAB POTENTIAL: Good   CLINICAL DECISION MAKING: Stable/uncomplicated   EVALUATION COMPLEXITY: Low     GOALS: Goals reviewed with patient? No   SHORT TERM GOALS: Target date: 05/28/2022  (Remove Blue Hyperlink)   Pt will be independent with HEP in order to improve strength and balance in order to decrease fall risk and improve function at home and work. Baseline: NT  Goal status: INITIAL   2.  Patient will demonstrate understanding of precautions listed in shoulder bicep tenodesis protocol to improve post surgical outcomes.  Baseline: Able to verbalize precautions for the phase of first four weeks  Goal status: ACHIEVED      LONG TERM GOALS: Target date: 07/23/2022  (Remove Blue Hyperlink)   Patient will have improved function and activity level as evidenced by an increase in FOTO score by 10 points or more.  Baseline: 30/100 target of 52  Goal status: Ongoing    2.  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  Patient will regain right shoulder strength that is symmetrical to left shoulder to return to lifting activities to return to gym and repairing cars.  Baseline: Unable to test RUE    06/29/22:Shoulder Flex R/L 5/5, Shoulder Abd R/L 5/5,  Shoulder Ext R/L NT, Shoulder ER R/L 5/5, Shoulder IR R/L 5/5    Goal status: Partially met    4.  Patient will return to weight lifting activities with ability to perform overhead resisted activities with right shoulder to return to gym and reengage with fitness activities.   Baseline: NT  Goal status: Ongoing      PLAN: PT FREQUENCY: 1-2x/week   PT DURATION: 10 weeks   PLANNED INTERVENTIONS: Therapeutic exercises, Therapeutic activity, Neuromuscular re-education, Patient/Family education, Self Care, Joint mobilization, Joint manipulation, Dry Needling, Spinal manipulation, Spinal mobilization, Cryotherapy, Moist heat, scar mobilization, Manual therapy, and Re-evaluation   PLAN FOR NEXT SESSION: Take FOTO.  Begin strengthening exercises for right shoulder and progress parascapular strengthening exercises.   Bradly Chris PT, DPT  06/29/2022, 11:14 AM

## 2022-07-01 ENCOUNTER — Encounter: Payer: Self-pay | Admitting: Physical Therapy

## 2022-07-01 ENCOUNTER — Ambulatory Visit: Payer: 59 | Admitting: Physical Therapy

## 2022-07-01 DIAGNOSIS — M67813 Other specified disorders of tendon, right shoulder: Secondary | ICD-10-CM

## 2022-07-01 DIAGNOSIS — M25511 Pain in right shoulder: Secondary | ICD-10-CM | POA: Diagnosis not present

## 2022-07-01 DIAGNOSIS — G8929 Other chronic pain: Secondary | ICD-10-CM

## 2022-07-01 NOTE — Therapy (Signed)
OUTPATIENT PHYSICAL THERAPY TREATMENT NOTE   Patient Name: Todd [name] MRN: [id number] DOB:1966-03-22, 56 y.o., male Today's Date: 07/01/2022  PCP: Dr. Leonel [name]  REFERRING PROVIDER: Dr. Jarrett [name]   END OF SESSION:   PT End of Session - 07/01/22 0935     Visit Number 5    Number of Visits 20    Date for PT Re-Evaluation 07/23/22    Authorization Type UHC Medicare/Medicaid    PT Start Time 0930    PT Stop Time 1015    PT Time Calculation (min) 45 min    Activity Tolerance Patient tolerated treatment well    Behavior During Therapy Todd [name] for tasks assessed/performed              Past Medical History:  Diagnosis Date   Anxiety    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   Chronic left shoulder pain 02/15/2016   Depression    Diabetes mellitus without complication ([place])    Dislocation of right thumb 06/05/2016   Diverticulitis    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  Migraines 2-3x/yr.   HTN (hypertension) 05/19/2016   Hypertension    MDD (major depressive disorder), recurrent episode ([place]) 05/28/2016   Neck pain, bilateral 02/15/2016   Primary osteoarthritis of right knee 01/31/2016   and fingers   PTSD (post-traumatic stress disorder) 06/05/2016   Severe recurrent major depression without psychotic features ([place]) 05/19/2016   Sleep apnea    unable to tolerate CPAP   Substance induced mood disorder ([place]) 05/19/2016   Suicidal ideation 05/19/2016   Type 2 diabetes mellitus with complication, without long-term current use of insulin ([place]) 06/08/2017   Uncontrolled type 2 diabetes mellitus with hyperglycemia, without long-term current use of insulin ([place]) 03/12/2017   Vertigo    Past Surgical History:  Procedure Laterality Date   BACK SURGERY  11/209/18 and 10/08/17   x 2   CIRCUMCISION N/A 07/25/2020   Procedure: CIRCUMCISION ADULT;  Surgeon: Todd Espy, MD;  Location: [facility] ORS;  Service: Urology;  Laterality: N/A;   COLONOSCOPY WITH PROPOFOL N/A 01/31/2018   Procedure: COLONOSCOPY WITH PROPOFOL;  Surgeon: Todd Silvas, MD;  Location: [facility] ENDOSCOPY;  Service: Endoscopy;  Laterality: N/A;   ESOPHAGOGASTRODUODENOSCOPY (EGD) WITH PROPOFOL N/A 01/31/2018   Procedure: ESOPHAGOGASTRODUODENOSCOPY (EGD) WITH PROPOFOL;  Surgeon: Todd Silvas, MD;  Location: [name] ENDOSCOPY;  Service: Endoscopy;  Laterality: N/A;   LEG AMPUTATION BELOW KNEE     left   NASAL SEPTOPLASTY W/ TURBINOPLASTY Bilateral 04/17/2020   Procedure: NASAL SEPTOPLASTY WITH INFERIORTURBINATE REDUCTION;  Surgeon: Todd Manner, MD;  Location: [place];  Service: ENT;  Laterality: Bilateral;  Diabetic - oral meds   SHOULDER SURGERY Left    Patient Active Problem List   Diagnosis Date Noted   Obesity (BMI 35.0-39.9 without comorbidity) 12/14/2019   Metatarsalgia of right foot 10/30/2019   Acquired absence of extremity 10/30/2019   Closed fracture of distal end of radius 05/31/2018   Radiculopathy of lumbosacral region 09/30/2017   Spondylolisthesis at L5-S1 level 09/30/2017   Morbid obesity ([place]) 09/06/2017   Hepatic steatosis 08/26/2017   Non-intractable vomiting with nausea 08/26/2017   Upper abdominal pain 08/26/2017   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   Gastroparesis 06/08/2017   History of noncompliance with medical treatment 06/08/2017   Diabetes mellitus type 2 in obese ([place]) 06/08/2017   Uncontrolled type  2 diabetes mellitus with hyperglycemia ([place]) 03/12/2017   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   Dislocation of right thumb 06/05/2016   Anxiety disorder 06/05/2016   MDD (major depressive disorder), recurrent episode ([place]) 05/28/2016   Substance induced mood disorder ([place]) 05/19/2016   Severe recurrent major depression without psychotic features ([place]) 05/19/2016   Suicidal ideation 05/19/2016   Essential (primary) hypertension 05/19/2016    GERD (gastroesophageal reflux disease) 05/19/2016   Alcohol use disorder, mild, abuse 05/19/2016   Chronic left shoulder pain 02/15/2016   Neck pain, bilateral 02/15/2016   Primary osteoarthritis of right knee 01/31/2016    REFERRING DIAG: S/p right biceps tenodesis    THERAPY DIAG:  Chronic right shoulder pain  Biceps tendonosis of right shoulder  Rationale for Evaluation and Treatment Rehabilitation  PERTINENT HISTORY: Per Todd [name] note on 05/11/22:     Todd [name] is a 56 y.o. male status post right biceps tenodesis revision on 05/06/2022.   PLAN: Provided patient with physical therapy referral and post-op rehab protocol. Advised to continue sling use per protocol. Discussed gentle elbow range of motion to prevent stiffness. All questions were answered and he is in agreement with plan.  - Follow-up plan: 4 weeks - X-rays/plan at next visit: none.   PRECAUTIONS: No lifting with affected UE, no loading biceps   SUBJECTIVE: Pt reports increased muscle soreness from strengthening exercises but he does feel improvement in biceps soreness.   PAIN:  Are you having pain? Yes: NPRS scale: 1-2/10 Pain location: AC joint on right shoulder near full shoulder flexion Pain description: Achy  Aggravating factors: Reaching terminal flexion and abduction ROM  Relieving factors: Not moving shoulder    OBJECTIVE: (objective measures completed at initial evaluation unless otherwise dated)              VITALS: BP 138/80 HR 77 SpO2 98   DIAGNOSTIC FINDINGS:      EXAM: XR SHOULDER 3 OR MORE VIEWS RIGHT  DATE: 03/13/2022 10:49 AM  ACCESSION: [id number] UN  DICTATED: 03/13/2022 11:03 AM  INTERPRETATION LOCATION: [place]   CLINICAL INDICATION: 56 years old Male with evaluate shoulder pain/previous repair  - M25.511 - Acute pain of right shoulder        COMPARISON: MRI dated 05/12/2021   TECHNIQUE: AP, Grashey, outlet, and axillary views of the right shoulder.   FINDINGS:   No acute fractures. No malalignment. Sequelae of distal clavicle resection. Glenohumeral joint space is preserved with small glenoid and inferior humeral head osteophyte. Peripherally sclerotic and circumscribed lucent lesion within the proximal humerus measuring approximately 6.2 x 2.3 cm without endosteal scalloping, cortical breakthrough or periosteal reaction. Irregularity along the greater tuberosity with small adjacent osseous bodies compatible with rotator cuff tendinopathy. Partially imaged lungs are clear.    ///////////////////////////////////////////////////////////////////////////////////////////////////////////////////////////////////////////   Christianne Dolin, MD - 03/13/2022  Formatting of this note might be different from the original.  EXAM: XR SHOULDER 3 OR MORE VIEWS RIGHT  DATE: 03/13/2022 10:49 AM  ACCESSION: [id number] UN  DICTATED: 03/13/2022 11:03 AM  INTERPRETATION LOCATION: [place]   CLINICAL INDICATION: 56 years old Male with evaluate shoulder pain/previous repair  - M25.511 - Acute pain of right shoulder     COMPARISON: MRI dated 05/12/2021   TECHNIQUE: AP, Grashey, outlet, and axillary views of the right shoulder.   FINDINGS:  No acute fractures. No malalignment. Sequelae of distal clavicle resection. Glenohumeral joint space is preserved with small glenoid and inferior humeral head osteophyte. Peripherally sclerotic and circumscribed lucent lesion within  the proximal humerus measuring approximately 6.2 x 2.3 cm without endosteal scalloping, cortical breakthrough or periosteal reaction. Irregularity along the greater tuberosity with small adjacent osseous bodies compatible with rotator cuff tendinopathy. Partially imaged lungs are clear.   IMPRESSION:  No acute fracture or dislocation.   Redemonstrated peripherally sclerotic proximal humerus lesion compatible with a nonaggressive osseous lesion. This is better characterized on prior MRI dated 05/12/2021    PATIENT SURVEYS:  FOTO 30/100   COGNITION:           Overall cognitive status: Within functional limits for tasks assessed                                  SENSATION: WFL   POSTURE: Rounded shoulders    UPPER EXTREMITY ROM:       Active ROM Right eval Left eval  Shoulder flexion 0 180  Shoulder extension 0 60  Shoulder abduction 180  180  Shoulder adduction      Shoulder internal rotation 70 70  Shoulder external rotation 90 90  Elbow flexion 150 150  Elbow extension 60 60  Wrist flexion 80 80  Wrist extension 70 70  Wrist ulnar deviation 30 30  Wrist radial deviation 20 20  Wrist pronation 80 80  Wrist supination 80 80  (Blank rows = not tested)          UPPER EXTREMITY MMT:   MMT Right eval Left eval 06/29/22  Shoulder flexion NT 5 5  Shoulder extension NT  5 5  Shoulder abduction NT  5 5  Shoulder adduction       Shoulder internal rotation     5  Shoulder external rotation     5  Middle trapezius   NT   Lower trapezius   NT    Elbow flexion   5   Elbow extension   5   Wrist flexion   4+*   Wrist extension   4+*   Wrist ulnar deviation       Wrist radial deviation       Wrist pronation       Wrist supination       Grip strength (lbs)   Fair*   (Blank rows = not tested)   Patient had recent carpal tunnel surgery on both wris   SHOULDER SPECIAL TESTS:  Not applicable because s/p right biceps tendesis 05/06/22   JOINT MOBILITY TESTING:  N/a   PALPATION:  Anterior surface of right shoulder              TODAY'S TREATMENT:   07/01/22:        UBE seat at 8 for 6 min         OMEGA Seated Rows #25 1 x 10        OMEGA Seated Rows #35 1 x 10         OMEGA Seated Rows #55 1 x 10           OMEGA Seated Rows #75 1 x 10         OMEGA Seated Rows #105 1 x 10                OMEGA Chest Press #25 2 x 10         OMEGA Chest Press #35 3 x 10  Shoulder Press with RUE #10 3 x 10                                            Side Lying  Shoulder ER #10 3 x 10    06/29/22:                    UBE seat at 8 for 6 min                     Shoulder MMT see above                     Shoulder Flexion LUE #2 DB 1 x 30                     Shoulder Abduction LUE #2 DB 1 x 30                       -Pt needed to stop after 20                     Shoulder Biceps LUE #2 DB  1x 30                     Shoulder Tricpes LUE #2 DB  1x 30     06/24/22:            Performed all RUE           Shoulder Flexion/Extension AAROM 3 x 10           Shoulder Abduction/Adduction AAROM 3 X 10           Shoulder Flexion Wall Walks 3 x 10           Shoulder Abduction Wall Walks 3 x 10            06/17/22:          Shoulder Flexion/Extension AAROM 3 x 10           Shoulder Abduction/Adduction AAROM 3 X 10           Right Shoulder ER Isometrics 5 sec x 3 x 10          Right Shoulder Flexion Isometrics 5 sec x 3 x 10           Right Shoulder Abduction Isometrics 5 x 3 x 10                    Initial:  Scapular Retraction 2 x 10  RUE ball squeezes  1 x 10    PATIENT EDUCATION: Education details: Form and technique for appropriate exercise.  Person educated: Patient Education method: Explanation, Demonstration, Verbal cues, and Handouts Education comprehension: verbalized understanding, returned demonstration, and verbal cues required              Educated patient on rehab protocol and precautions for post-op phase : [email or web address]     HOME EXERCISE PROGRAM: Access Code: [email or web address] URL: [email or web address] Date: 06/29/2022 Prepared by: Bradly Chris  Exercises - Sleeper Stretch  - 7 x weekly - 3 reps - 30 hold - Wall Push Up with Plus  - 3 x weekly - 3 sets - 15 reps - Single Arm Shoulder Flexion with Dumbbell  - 1 x daily - 3 x  weekly - 1 sets - 30 reps - Shoulder Abduction with Dumbbells - Thumbs Up   - 1 x daily - 3 x weekly - 1 sets - 30 reps - Bicep Curl to Shoulder Press with Dumbbells  - 1 x daily - 3 x weekly - 30 reps - Seated Overhead Elbow Extension  - 1 x daily - 3 x weekly - 30 reps   ASSESSMENT:   CLINICAL IMPRESSION:   Pt presents for s/p 8 weeks for right biceps tenodesis. He continues to show improvements in right shoulder and parascapular strength with ability to perform exercises with increased resistance.  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  OBJECTIVE IMPAIRMENTS decreased ROM, decreased strength, impaired UE functional use, and pain.    ACTIVITY LIMITATIONS carrying, lifting, dressing, self feeding, reach over head, and hygiene/grooming   PARTICIPATION LIMITATIONS: meal prep, cleaning, shopping, occupation, and yard work   PERSONAL FACTORS 3+ comorbidities: T2DM, PTSD, Sleep apnea  are also affecting patient's functional outcome.    REHAB POTENTIAL: Good   CLINICAL DECISION MAKING: Stable/uncomplicated   EVALUATION COMPLEXITY: Low     GOALS: Goals reviewed with patient? No   SHORT TERM GOALS: Target date: 05/28/2022  (Remove Blue Hyperlink)   Pt will be independent with HEP in order to improve strength and balance in order to decrease fall risk and improve function at home and work. Baseline: NT  Goal status: INITIAL   2.  Patient will demonstrate understanding of precautions listed in shoulder bicep tenodesis protocol to improve post surgical outcomes.  Baseline: Able to verbalize precautions for the phase of first four weeks  Goal status: ACHIEVED      LONG TERM GOALS: Target date: 07/23/2022  (Remove Blue Hyperlink)   Patient will have improved function and activity level as evidenced by an increase in FOTO score by 10 points or more.  Baseline: 30/100 target of 52  Goal status: Ongoing    2.  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  Patient will regain right shoulder strength that is symmetrical to left shoulder to return to lifting activities to return to gym and repairing cars.  Baseline: Unable to test RUE    06/29/22:Shoulder Flex R/L 5/5, Shoulder Abd R/L 5/5,  Shoulder Ext R/L NT, Shoulder ER R/L 5/5, Shoulder IR R/L 5/5    Goal status: Partially met    4.  Patient will return to weight lifting activities with ability to perform overhead resisted activities with right shoulder to return to gym and reengage with fitness activities.   Baseline: NT  Goal status: Ongoing      PLAN: PT FREQUENCY: 1-2x/week   PT DURATION: 10 weeks   PLANNED INTERVENTIONS: Therapeutic exercises, Therapeutic activity, Neuromuscular re-education, Patient/Family education, Self Care, Joint mobilization, Joint manipulation, Dry Needling, Spinal manipulation, Spinal mobilization, Cryotherapy, Moist heat, scar mobilization, Manual therapy, and Re-evaluation   PLAN FOR NEXT SESSION: Take FOTO.  Progress  strengthening exercises for right shoulder and progress parascapular strengthening  exercises. See biceps tenodesis protocol on desk for protocol for weeks 8-12   Bradly Chris PT, DPT  07/01/2022, 9:35 AM

## 2022-07-05 NOTE — Therapy (Signed)
OUTPATIENT PHYSICAL THERAPY TREATMENT NOTE   Patient Name: Todd [name] MRN: [id number] DOB:1966-07-10, 56 y.o., male Today's Date: 07/06/2022  PCP: Dr. Leonel Ramsay  REFERRING PROVIDER: Dr. Jarrett Ables   END OF SESSION:   PT End of Session - 07/06/22 0932     Visit Number 6    Number of Visits 20    Date for PT Re-Evaluation 07/23/22    Authorization Type UHC Medicare/Medicaid    PT Start Time 0930    PT Stop Time 1015    PT Time Calculation (min) 45 min    Activity Tolerance Patient tolerated treatment well    Behavior During Therapy [facility] for tasks assessed/performed               Past Medical History:  Diagnosis Date   Anxiety    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   Chronic left shoulder pain 02/15/2016   Depression    Diabetes mellitus without complication ([place])    Dislocation of right thumb 06/05/2016   Diverticulitis    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  Migraines 2-3x/yr.   HTN (hypertension) 05/19/2016   Hypertension    MDD (major depressive disorder), recurrent episode ([place]) 05/28/2016   Neck pain, bilateral 02/15/2016   Primary osteoarthritis of right knee 01/31/2016   and fingers   PTSD (post-traumatic stress disorder) 06/05/2016   Severe recurrent major depression without psychotic features ([place]) 05/19/2016   Sleep apnea    unable to tolerate CPAP   Substance induced mood disorder ([place]) 05/19/2016   Suicidal ideation 05/19/2016   Type 2 diabetes mellitus with complication, without long-term current use of insulin ([place]) 06/08/2017   Uncontrolled type 2 diabetes mellitus with hyperglycemia, without long-term current use of insulin ([place]) 03/12/2017   Vertigo    Past Surgical History:  Procedure Laterality Date   BACK SURGERY  11/209/18 and 10/08/17   x 2   CIRCUMCISION N/A 07/25/2020   Procedure: CIRCUMCISION ADULT;  Surgeon: Hollice Espy, MD;  Location: [facility] ORS;  Service: Urology;  Laterality: N/A;   COLONOSCOPY WITH PROPOFOL N/A 01/31/2018   Procedure: COLONOSCOPY WITH PROPOFOL;  Surgeon: Manya Silvas, MD;  Location: [facility] ENDOSCOPY;  Service: Endoscopy;  Laterality: N/A;   ESOPHAGOGASTRODUODENOSCOPY (EGD) WITH PROPOFOL N/A 01/31/2018   Procedure: ESOPHAGOGASTRODUODENOSCOPY (EGD) WITH PROPOFOL;  Surgeon: Manya Silvas, MD;  Location: [facility] ENDOSCOPY;  Service: Endoscopy;  Laterality: N/A;   LEG AMPUTATION BELOW KNEE     left   NASAL SEPTOPLASTY W/ TURBINOPLASTY Bilateral 04/17/2020   Procedure: NASAL SEPTOPLASTY WITH INFERIORTURBINATE REDUCTION;  Surgeon: Carloyn Manner, MD;  Location: [place];  Service: ENT;  Laterality: Bilateral;  Diabetic - oral meds   SHOULDER SURGERY Left    Patient Active Problem List   Diagnosis Date Noted   Obesity (BMI 35.0-39.9 without comorbidity) 12/14/2019   Metatarsalgia of right foot 10/30/2019   Acquired absence of extremity 10/30/2019   Closed fracture of distal end of radius 05/31/2018   Radiculopathy of lumbosacral region 09/30/2017   Spondylolisthesis at L5-S1 level 09/30/2017   Morbid obesity ([place]) 09/06/2017   Hepatic steatosis 08/26/2017   Non-intractable vomiting with nausea 08/26/2017   Upper abdominal pain 08/26/2017   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   Gastroparesis 06/08/2017   History of noncompliance with medical treatment 06/08/2017   Diabetes mellitus type 2 in obese ([place]) 06/08/2017   Uncontrolled  type 2 diabetes mellitus with hyperglycemia ([place]) 03/12/2017   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   Dislocation of right thumb 06/05/2016   Anxiety disorder 06/05/2016   MDD (major depressive disorder), recurrent episode ([place]) 05/28/2016   Substance induced mood disorder ([place]) 05/19/2016   Severe recurrent major depression without psychotic features ([place]) 05/19/2016   Suicidal ideation 05/19/2016   Essential (primary) hypertension 05/19/2016    GERD (gastroesophageal reflux disease) 05/19/2016   Alcohol use disorder, mild, abuse 05/19/2016   Chronic left shoulder pain 02/15/2016   Neck pain, bilateral 02/15/2016   Primary osteoarthritis of right knee 01/31/2016    REFERRING DIAG: S/p right biceps tenodesis    THERAPY DIAG:  Chronic right shoulder pain  Biceps tendonosis of right shoulder  Rationale for Evaluation and Treatment Rehabilitation  PERTINENT HISTORY: Per Dr. Shelbie Ammons note on 05/11/22:     Todd [name] is a 56 y.o. male status post right biceps tenodesis revision on 05/06/2022.   PLAN: Provided patient with physical therapy referral and post-op rehab protocol. Advised to continue sling use per protocol. Discussed gentle elbow range of motion to prevent stiffness. All questions were answered and he is in agreement with plan.  - Follow-up plan: 4 weeks - X-rays/plan at next visit: none.   PRECAUTIONS: No lifting with affected UE, no loading biceps   SUBJECTIVE:   Pt report he is having some pain with his triceps muscle today and due to the pain, he did not do the HEP over the weekend.      PAIN:  Are you having pain? Yes: NPRS scale: 6/10 Pain location: AC joint on right shoulder near full shoulder flexion Pain description: Achy  Aggravating factors: Reaching terminal flexion and abduction ROM  Relieving factors: Not moving shoulder    OBJECTIVE: (objective measures completed at initial evaluation unless otherwise dated)  VITALS: BP 138/80 HR 77 SpO2 98   DIAGNOSTIC FINDINGS:      EXAM: XR SHOULDER 3 OR MORE VIEWS RIGHT  DATE: 03/13/2022 10:49 AM  ACCESSION: [id number]  DICTATED: 03/13/2022 11:03 AM  INTERPRETATION LOCATION: [place]   CLINICAL INDICATION: 56 years old Male with evaluate shoulder pain/previous repair  - M25.511 - Acute pain of right shoulder        COMPARISON: MRI dated 05/12/2021   TECHNIQUE: AP, Grashey, outlet, and axillary views of the right shoulder.    FINDINGS:  No acute fractures. No malalignment. Sequelae of distal clavicle resection. Glenohumeral joint space is preserved with small glenoid and inferior humeral head osteophyte. Peripherally sclerotic and circumscribed lucent lesion within the proximal humerus measuring approximately 6.2 x 2.3 cm without endosteal scalloping, cortical breakthrough or periosteal reaction. Irregularity along the greater tuberosity with small adjacent osseous bodies compatible with rotator cuff tendinopathy. Partially imaged lungs are clear.    ///////////////////////////////////////////////////////////////////////////////////////////////////////////////////////////////////////////   Christianne Dolin, MD - 03/13/2022  Formatting of this note might be different from the original.  EXAM: XR SHOULDER 3 OR MORE VIEWS RIGHT  DATE: 03/13/2022 10:49 AM  ACCESSION: [id number]  DICTATED: 03/13/2022 11:03 AM  INTERPRETATION LOCATION: [place]   CLINICAL INDICATION: 56 years old Male with evaluate shoulder pain/previous repair  - M25.511 - Acute pain of right shoulder     COMPARISON: MRI dated 05/12/2021   TECHNIQUE: AP, Grashey, outlet, and axillary views of the right shoulder.   FINDINGS:  No acute fractures. No malalignment. Sequelae of distal clavicle resection. Glenohumeral joint space is preserved with small glenoid and inferior humeral head osteophyte. Peripherally sclerotic and  circumscribed lucent lesion within the proximal humerus measuring approximately 6.2 x 2.3 cm without endosteal scalloping, cortical breakthrough or periosteal reaction. Irregularity along the greater tuberosity with small adjacent osseous bodies compatible with rotator cuff tendinopathy. Partially imaged lungs are clear.   IMPRESSION:  No acute fracture or dislocation.   Redemonstrated peripherally sclerotic proximal humerus lesion compatible with a nonaggressive osseous lesion. This is better characterized on prior MRI dated  05/12/2021   PATIENT SURVEYS:  FOTO 74/100   COGNITION: Overall cognitive status: Within functional limits for tasks assessed                                  SENSATION: WFL   POSTURE: Rounded shoulders    UPPER EXTREMITY ROM:       Active ROM Right eval Left eval  Shoulder flexion 0 180  Shoulder extension 0 60  Shoulder abduction 180  180  Shoulder adduction      Shoulder internal rotation 70 70  Shoulder external rotation 90 90  Elbow flexion 150 150  Elbow extension 60 60  Wrist flexion 80 80  Wrist extension 70 70  Wrist ulnar deviation 30 30  Wrist radial deviation 20 20  Wrist pronation 80 80  Wrist supination 80 80  (Blank rows = not tested)          UPPER EXTREMITY MMT:   MMT Right eval Left eval 06/29/22  Shoulder flexion NT 5 5  Shoulder extension NT  5 5  Shoulder abduction NT  5 5  Shoulder adduction       Shoulder internal rotation     5  Shoulder external rotation     5  Middle trapezius   NT   Lower trapezius   NT    Elbow flexion   5   Elbow extension   5   Wrist flexion   4+*   Wrist extension   4+*   Wrist ulnar deviation       Wrist radial deviation       Wrist pronation       Wrist supination       Grip strength (lbs)   Fair*   (Blank rows = not tested)   Patient had recent carpal tunnel surgery on both wris   SHOULDER SPECIAL TESTS:  Not applicable because s/p right biceps tendesis 05/06/22   JOINT MOBILITY TESTING:  N/a   PALPATION:  Anterior surface of right shoulder              TODAY'S TREATMENT:   07/05/22:  Resisted PNF D1 & D2 with Silver TB, 2x10 each direction  OMEGA Seated Rows #35 1 x 10  OMEGA Seated Rows #45 1 x 10 OMEGA Seated Rows #55 1 x 10    OMEGA Seated Rows #85 1 x 10  OMEGA Seated Rows #115 1 x 4  OMEGA Seated Rows #105 1 x 6   OMEGA Chest Press #35 2 x 10 pt notes 7/10 pain after second set, so discontinued any subsequent planned attempts  OMEGA Lat Pull Down, #35, 3 x 10  Shoulder Press  with RUE #10 3 x 15 Side Lying Shoulder ER #10 2 x10   07/01/22:  UBE seat at 8 for 6 min  OMEGA Seated Rows #25 1 x 10 OMEGA Seated Rows #35 1 x 10  OMEGA Seated Rows #55 1 x 10    OMEGA Seated Rows #75  1 x 10  OMEGA Seated Rows #105 1 x 10  OMEGA Chest Press #25 2 x 10  OMEGA Chest Press #35 3 x 10  Shoulder Press with RUE #10 3 x 10   Side Lying Shoulder ER #10 3 x 10    06/29/22:  UBE seat at 8 for 6 min  Shoulder MMT see above  Shoulder Flexion LUE #2 DB 1 x 30  Shoulder Abduction LUE #2 DB 1 x 30     -Pt needed to stop after 20  Shoulder Biceps LUE #2 DB  1x 30  Shoulder Tricpes LUE #2 DB  1x 30     06/24/22:    Performed all RUE  Shoulder Flexion/Extension AAROM 3 x 10  Shoulder Abduction/Adduction AAROM 3 X 10  Shoulder Flexion Wall Walks 3 x 10  Shoulder Abduction Wall Walks 3 x 10            06/17/22:  Shoulder Flexion/Extension AAROM 3 x 10  Shoulder Abduction/Adduction AAROM 3 X 10  Right Shoulder ER Isometrics 5 sec x 3 x 10 Right Shoulder Flexion Isometrics 5 sec x 3 x 10  Right Shoulder Abduction Isometrics 5 x 3 x 10                    Initial:  Scapular Retraction 2 x 10  RUE ball squeezes  1 x 10    PATIENT EDUCATION: Education details: Form and technique for appropriate exercise.  Person educated: Patient Education method: Explanation, Demonstration, Verbal cues, and Handouts Education comprehension: verbalized understanding, returned demonstration, and verbal cues required   Educated patient on rehab protocol and precautions for post-op phase : [email or web address]     HOME EXERCISE PROGRAM: Access Code: [email or web address] URL: [email or web address] Date: 06/29/2022 Prepared by: Bradly Chris  Exercises - Sleeper Stretch  - 7 x weekly - 3 reps - 30 hold - Wall Push Up with Plus  - 3 x weekly - 3 sets - 15  reps - Single Arm Shoulder Flexion with Dumbbell  - 1 x daily - 3 x weekly - 1 sets - 30 reps - Shoulder Abduction with Dumbbells - Thumbs Up  - 1 x daily - 3 x weekly - 1 sets - 30 reps - Bicep Curl to Shoulder Press with Dumbbells  - 1 x daily - 3 x weekly - 30 reps - Seated Overhead Elbow Extension  - 1 x daily - 3 x weekly - 30 reps   ASSESSMENT:   CLINICAL IMPRESSION:  Pt performed well with all exercise and put forth good effort throughout exercises.  Pt is making significant improvement with scapular strength.  Pt did demonstrate some increased pain with certain exercises as noted above and was advised to stop performing when pain is present.  Pt obliged and different exercises were performed.   Pt will continue to benefit from skilled therapy to address remaining deficits in order to improve overall QoL and return to PLOF.      OBJECTIVE IMPAIRMENTS decreased ROM, decreased strength, impaired UE functional use, and pain.    ACTIVITY LIMITATIONS carrying, lifting, dressing, self feeding, reach over head, and hygiene/grooming   PARTICIPATION LIMITATIONS: meal prep, cleaning, shopping, occupation, and yard work   PERSONAL FACTORS 3+ comorbidities: T2DM, PTSD, Sleep apnea  are also affecting patient's functional outcome.    REHAB POTENTIAL: Good   CLINICAL DECISION MAKING: Stable/uncomplicated   EVALUATION COMPLEXITY: Low     GOALS:  Goals reviewed with patient? No  SHORT TERM GOALS: Target date: 05/28/2022    Pt will be independent with HEP in order to improve strength and balance in order to decrease fall risk and improve function at home and work. Baseline: NT  Goal status: INITIAL   2.  Patient will demonstrate understanding of precautions listed in shoulder bicep tenodesis protocol to improve post surgical outcomes.  Baseline: Able to verbalize precautions for the phase of first four weeks  Goal status: ACHIEVED      LONG TERM GOALS: Target date: 07/23/2022      Patient will have improved function and activity level as evidenced by an increase in FOTO score by 10 points or more.  Baseline: 30/100 target of 52  Goal status: Ongoing    2.  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  Patient will regain right shoulder strength that is symmetrical to left shoulder to return to lifting activities to return to gym and repairing cars.  Baseline: Unable to test RUE    06/29/22:Shoulder Flex R/L 5/5, Shoulder Abd R/L 5/5,  Shoulder Ext R/L NT, Shoulder ER R/L 5/5, Shoulder IR R/L 5/5    Goal status: Partially met    4.  Patient will return to weight lifting activities with ability to perform overhead resisted activities with right shoulder to return to gym and reengage with fitness activities.   Baseline: NT  Goal status: Ongoing      PLAN: PT FREQUENCY: 1-2x/week   PT DURATION: 10 weeks   PLANNED INTERVENTIONS: Therapeutic exercises, Therapeutic activity, Neuromuscular re-education, Patient/Family education, Self Care, Joint mobilization, Joint manipulation, Dry Needling, Spinal manipulation, Spinal mobilization, Cryotherapy, Moist heat, scar mobilization, Manual therapy, and Re-evaluation  PLAN FOR NEXT SESSION:  Continue to progress strengthening exercises for right shoulder and add to HEP. See biceps tenodesis protocol on desk for protocol for weeks 8-12     Gwenlyn Saran, PT, DPT 07/06/22, 10:56 AM

## 2022-07-06 ENCOUNTER — Ambulatory Visit: Payer: 59

## 2022-07-06 DIAGNOSIS — G8929 Other chronic pain: Secondary | ICD-10-CM

## 2022-07-06 DIAGNOSIS — M67813 Other specified disorders of tendon, right shoulder: Secondary | ICD-10-CM

## 2022-07-06 DIAGNOSIS — M25511 Pain in right shoulder: Secondary | ICD-10-CM | POA: Diagnosis not present

## 2022-07-09 ENCOUNTER — Ambulatory Visit: Payer: 59 | Admitting: Physical Therapy

## 2022-07-09 ENCOUNTER — Encounter: Payer: Self-pay | Admitting: Physical Therapy

## 2022-07-09 DIAGNOSIS — G8929 Other chronic pain: Secondary | ICD-10-CM

## 2022-07-09 DIAGNOSIS — M25511 Pain in right shoulder: Secondary | ICD-10-CM | POA: Diagnosis not present

## 2022-07-09 DIAGNOSIS — M67813 Other specified disorders of tendon, right shoulder: Secondary | ICD-10-CM

## 2022-07-09 NOTE — Therapy (Signed)
OUTPATIENT PHYSICAL THERAPY TREATMENT NOTE   Patient Name: Todd [name] MRN: [id number] DOB:1966-03-29, 56 y.o., male Today's Date: 07/09/2022  PCP: Dr. Leonel Ramsay  REFERRING PROVIDER: Dr. Jarrett Ables   END OF SESSION:   PT End of Session - 07/09/22 0938     Visit Number 7    Number of Visits 20    Date for PT Re-Evaluation 07/23/22    Authorization Type UHC Medicare/Medicaid    PT Start Time 1027    PT Stop Time 1015    PT Time Calculation (min) 40 min    Activity Tolerance Patient tolerated treatment well    Behavior During Therapy [facility] for tasks assessed/performed               Past Medical History:  Diagnosis Date   Anxiety    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   Chronic left shoulder pain 02/15/2016   Depression    Diabetes mellitus without complication ([place])    Dislocation of right thumb 06/05/2016   Diverticulitis    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  Migraines 2-3x/yr.   HTN (hypertension) 05/19/2016   Hypertension    MDD (major depressive disorder), recurrent episode ([place]) 05/28/2016   Neck pain, bilateral 02/15/2016   Primary osteoarthritis of right knee 01/31/2016   and fingers   PTSD (post-traumatic stress disorder) 06/05/2016   Severe recurrent major depression without psychotic features ([place]) 05/19/2016   Sleep apnea    unable to tolerate CPAP   Substance induced mood disorder ([place]) 05/19/2016   Suicidal ideation 05/19/2016   Type 2 diabetes mellitus with complication, without long-term current use of insulin ([place]) 06/08/2017   Uncontrolled type 2 diabetes mellitus with hyperglycemia, without long-term current use of insulin ([place]) 03/12/2017   Vertigo    Past Surgical History:  Procedure Laterality Date   BACK SURGERY  11/209/18 and 10/08/17   x 2   CIRCUMCISION N/A 07/25/2020   Procedure: CIRCUMCISION ADULT;  Surgeon: Hollice Espy, MD;  Location: [facility] ORS;  Service: Urology;  Laterality: N/A;   COLONOSCOPY WITH PROPOFOL N/A 01/31/2018   Procedure: COLONOSCOPY WITH PROPOFOL;  Surgeon: Manya Silvas, MD;  Location: [facility] ENDOSCOPY;  Service: Endoscopy;  Laterality: N/A;   ESOPHAGOGASTRODUODENOSCOPY (EGD) WITH PROPOFOL N/A 01/31/2018   Procedure: ESOPHAGOGASTRODUODENOSCOPY (EGD) WITH PROPOFOL;  Surgeon: Manya Silvas, MD;  Location: [facility] ENDOSCOPY;  Service: Endoscopy;  Laterality: N/A;   LEG AMPUTATION BELOW KNEE     left   NASAL SEPTOPLASTY W/ TURBINOPLASTY Bilateral 04/17/2020   Procedure: NASAL SEPTOPLASTY WITH INFERIORTURBINATE REDUCTION;  Surgeon: Carloyn Manner, MD;  Location: [place];  Service: ENT;  Laterality: Bilateral;  Diabetic - oral meds   SHOULDER SURGERY Left    Patient Active Problem List   Diagnosis Date Noted   Obesity (BMI 35.0-39.9 without comorbidity) 12/14/2019   Metatarsalgia of right foot 10/30/2019   Acquired absence of extremity 10/30/2019   Closed fracture of distal end of radius 05/31/2018   Radiculopathy of lumbosacral region 09/30/2017   Spondylolisthesis at L5-S1 level 09/30/2017   Morbid obesity ([place]) 09/06/2017   Hepatic steatosis 08/26/2017   Non-intractable vomiting with nausea 08/26/2017   Upper abdominal pain 08/26/2017   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   Gastroparesis 06/08/2017   History of noncompliance with medical treatment 06/08/2017   Diabetes mellitus type 2 in obese ([place]) 06/08/2017   Uncontrolled  type 2 diabetes mellitus with hyperglycemia ([place]) 03/12/2017   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   Dislocation of right thumb 06/05/2016   Anxiety disorder 06/05/2016   MDD (major depressive disorder), recurrent episode ([place]) 05/28/2016   Substance induced mood disorder ([place]) 05/19/2016   Severe recurrent major depression without psychotic features ([place]) 05/19/2016   Suicidal ideation 05/19/2016   Essential (primary) hypertension 05/19/2016    GERD (gastroesophageal reflux disease) 05/19/2016   Alcohol use disorder, mild, abuse 05/19/2016   Chronic left shoulder pain 02/15/2016   Neck pain, bilateral 02/15/2016   Primary osteoarthritis of right knee 01/31/2016    REFERRING DIAG: S/p right biceps tenodesis    THERAPY DIAG:  Chronic right shoulder pain  Biceps tendonosis of right shoulder  Rationale for Evaluation and Treatment Rehabilitation  PERTINENT HISTORY: Per Dr. Shelbie Ammons note on 05/11/22:     Todd [name] is a 56 y.o. male status post right biceps tenodesis revision on 05/06/2022.   PLAN: Provided patient with physical therapy referral and post-op rehab protocol. Advised to continue sling use per protocol. Discussed gentle elbow range of motion to prevent stiffness. All questions were answered and he is in agreement with plan.  - Follow-up plan: 4 weeks - X-rays/plan at next visit: none.   PRECAUTIONS: No lifting with affected UE, no loading biceps   SUBJECTIVE:   Pt reports increased triceps pain after last session that has improved but he is still concerned by the pain.    PAIN:  Are you having pain? Yes: NPRS scale: 8/10 Pain location: AC joint on right shoulder near full shoulder flexion Pain description: Achy  Aggravating factors: Doing triceps exercises  Relieving factors: Not doing triceps exercises     OBJECTIVE: (objective measures completed at initial evaluation unless otherwise dated)  VITALS: BP 138/80 HR 77 SpO2 98   DIAGNOSTIC FINDINGS:      EXAM: XR SHOULDER 3 OR MORE VIEWS RIGHT  DATE: 03/13/2022 10:49 AM  ACCESSION: [id number] UN  DICTATED: 03/13/2022 11:03 AM  INTERPRETATION LOCATION: [place]   CLINICAL INDICATION: 56 years old Male with evaluate shoulder pain/previous repair  - M25.511 - Acute pain of right shoulder        COMPARISON: MRI dated 05/12/2021   TECHNIQUE: AP, Grashey, outlet, and axillary views of the right shoulder.   FINDINGS:  No acute fractures. No  malalignment. Sequelae of distal clavicle resection. Glenohumeral joint space is preserved with small glenoid and inferior humeral head osteophyte. Peripherally sclerotic and circumscribed lucent lesion within the proximal humerus measuring approximately 6.2 x 2.3 cm without endosteal scalloping, cortical breakthrough or periosteal reaction. Irregularity along the greater tuberosity with small adjacent osseous bodies compatible with rotator cuff tendinopathy. Partially imaged lungs are clear.    ///////////////////////////////////////////////////////////////////////////////////////////////////////////////////////////////////////////   Christianne Dolin, MD - 03/13/2022  Formatting of this note might be different from the original.  EXAM: XR SHOULDER 3 OR MORE VIEWS RIGHT  DATE: 03/13/2022 10:49 AM  ACCESSION: [id number] UN  DICTATED: 03/13/2022 11:03 AM  INTERPRETATION LOCATION: [place]   CLINICAL INDICATION: 56 years old Male with evaluate shoulder pain/previous repair  - M25.511 - Acute pain of right shoulder     COMPARISON: MRI dated 05/12/2021   TECHNIQUE: AP, Grashey, outlet, and axillary views of the right shoulder.   FINDINGS:  No acute fractures. No malalignment. Sequelae of distal clavicle resection. Glenohumeral joint space is preserved with small glenoid and inferior humeral head osteophyte. Peripherally sclerotic and circumscribed lucent lesion within the proximal humerus measuring approximately 6.2  x 2.3 cm without endosteal scalloping, cortical breakthrough or periosteal reaction. Irregularity along the greater tuberosity with small adjacent osseous bodies compatible with rotator cuff tendinopathy. Partially imaged lungs are clear.   IMPRESSION:  No acute fracture or dislocation.   Redemonstrated peripherally sclerotic proximal humerus lesion compatible with a nonaggressive osseous lesion. This is better characterized on prior MRI dated 05/12/2021   PATIENT SURVEYS:  FOTO  74/100   COGNITION: Overall cognitive status: Within functional limits for tasks assessed                                  SENSATION: WFL   POSTURE: Rounded shoulders    UPPER EXTREMITY ROM:       Active ROM Right eval Left eval  Shoulder flexion 0 180  Shoulder extension 0 60  Shoulder abduction 180  180  Shoulder adduction      Shoulder internal rotation 70 70  Shoulder external rotation 90 90  Elbow flexion 150 150  Elbow extension 60 60  Wrist flexion 80 80  Wrist extension 70 70  Wrist ulnar deviation 30 30  Wrist radial deviation 20 20  Wrist pronation 80 80  Wrist supination 80 80  (Blank rows = not tested)          UPPER EXTREMITY MMT:   MMT Right eval Left eval 06/29/22  Shoulder flexion NT 5 5  Shoulder extension NT  5 5  Shoulder abduction NT  5 5  Shoulder adduction       Shoulder internal rotation     5  Shoulder external rotation     5  Middle trapezius   NT   Lower trapezius   NT    Elbow flexion   5   Elbow extension   5   Wrist flexion   4+*   Wrist extension   4+*   Wrist ulnar deviation       Wrist radial deviation       Wrist pronation       Wrist supination       Grip strength (lbs)   Fair*   (Blank rows = not tested)   Patient had recent carpal tunnel surgery on both wris   SHOULDER SPECIAL TESTS:  Not applicable because s/p right biceps tendesis 05/06/22   JOINT MOBILITY TESTING:  N/a   PALPATION:  Anterior surface of right shoulder              TODAY'S TREATMENT:   07/09/22: UBE seat at 8 for 6 min   OMEGA Seated Rows #95 3 x 8  OMEGA Lat Pull Down #35 1 x 10  OMEGA Lat Pull Down #45 1 x 10  OMEGA Lat Pull Down #55 1 x 10   Wall Slides with pillow case 2 x 10   Bent over tricep extensions on RUE  1 x 10   07/05/22:  Resisted PNF D1 & D2 with Silver TB, 2x10 each direction OMEGA Seated Rows #35 1 x 10  OMEGA Seated Rows #45 1 x 10 OMEGA Seated Rows #55 1 x 10    OMEGA Seated Rows #85 1 x 10  OMEGA Seated  Rows #115 1 x 4  OMEGA Seated Rows #105 1 x 6   OMEGA Chest Press #35 2 x 10 pt notes 7/10 pain after second set, so discontinued any subsequent planned attempts  OMEGA Lat Pull Down, #35, 3 x 10  Shoulder Press with RUE #10 3 x 15 Side Lying Shoulder ER #10 2 x10   07/01/22:  UBE seat at 8 for 6 min  OMEGA Seated Rows #25 1 x 10 OMEGA Seated Rows #35 1 x 10  OMEGA Seated Rows #55 1 x 10    OMEGA Seated Rows #75 1 x 10  OMEGA Seated Rows #105 1 x 10  OMEGA Chest Press #25 2 x 10  OMEGA Chest Press #35 3 x 10  Shoulder Press with RUE #10 3 x 10   Side Lying Shoulder ER #10 3 x 10    06/29/22:  UBE seat at 8 for 6 min  Shoulder MMT see above  Shoulder Flexion LUE #2 DB 1 x 30  Shoulder Abduction LUE #2 DB 1 x 30     -Pt needed to stop after 20  Shoulder Biceps LUE #2 DB  1x 30  Shoulder Tricpes LUE #2 DB  1x 30     06/24/22:    Performed all RUE  Shoulder Flexion/Extension AAROM 3 x 10  Shoulder Abduction/Adduction AAROM 3 X 10  Shoulder Flexion Wall Walks 3 x 10  Shoulder Abduction Wall Walks 3 x 10            06/17/22:  Shoulder Flexion/Extension AAROM 3 x 10  Shoulder Abduction/Adduction AAROM 3 X 10  Right Shoulder ER Isometrics 5 sec x 3 x 10 Right Shoulder Flexion Isometrics 5 sec x 3 x 10  Right Shoulder Abduction Isometrics 5 x 3 x 10                    Initial:  Scapular Retraction 2 x 10  RUE ball squeezes  1 x 10    PATIENT EDUCATION: Education details: Form and technique for appropriate exercise.  Person educated: Patient Education method: Explanation, Demonstration, Verbal cues, and Handouts Education comprehension: verbalized understanding, returned demonstration, and verbal cues required   Educated patient on rehab protocol and precautions for post-op phase : [email or web address]     HOME EXERCISE PROGRAM: Access Code:  [email or web address] URL: [email or web address] Date: 07/09/2022 Prepared by: Bradly Chris  Exercises - Sleeper Stretch  - 7 x weekly - 3 reps - 30 hold - Single Arm Shoulder Flexion with Dumbbell  - 1 x daily - 3 x weekly - 1 sets - 30 reps - Shoulder Abduction with Dumbbells - Thumbs Up  - 1 x daily - 3 x weekly - 1 sets - 30 reps - Bicep Curl to Shoulder Press with Dumbbells  - 1 x daily - 3 x weekly - 30 reps - Sidelying Shoulder ER with Towel and Dumbbell  - 1 x daily - 3 x weekly - 3 sets - 10 reps - Shoulder Overhead Press in Flexion with Dumbbells  - 1 x daily - 3 x weekly - 3 sets - 10 reps - Scapular Wall Slides  - 1 x daily - 3 x weekly - 3 sets - 10 reps - Standing Bent Over on Chair Single Arm Tricep Extension with Dumbbell  - 1 x daily - 3 x weekly - 3 sets - 10 reps  ASSESSMENT:   CLINICAL IMPRESSION:  Pt exhibits improved parascapular strength with ability to perform strengthening exercises with increased resistance and without an exacerbation of his pain. Triceps exercise modified to include lower level of resistance  in bent over non-elbow flexed position for decreased muscle activitation to avoid pain. Pt will continue to benefit from skilled therapy to address remaining  deficits in order to improve overall QoL and return to PLOF.    OBJECTIVE IMPAIRMENTS decreased ROM, decreased strength, impaired UE functional use, and pain.    ACTIVITY LIMITATIONS carrying, lifting, dressing, self feeding, reach over head, and hygiene/grooming   PARTICIPATION LIMITATIONS: meal prep, cleaning, shopping, occupation, and yard work   PERSONAL FACTORS 3+ comorbidities: T2DM, PTSD, Sleep apnea  are also affecting patient's functional outcome.    REHAB POTENTIAL: Good   CLINICAL DECISION MAKING: Stable/uncomplicated   EVALUATION COMPLEXITY: Low     GOALS:  Goals reviewed with patient? No   SHORT TERM GOALS: Target date: 05/28/2022    Pt will be independent with HEP in  order to improve strength and balance in order to decrease fall risk and improve function at home and work. Baseline: Performing independently  Goal status: Ongoing    2.  Patient will demonstrate understanding of precautions listed in shoulder bicep tenodesis protocol to improve post surgical outcomes.  Baseline: Able to verbalize precautions for the phase of first four weeks  Goal status: ACHIEVED      LONG TERM GOALS: Target date: 07/23/2022     Patient will have improved function and activity level as evidenced by an increase in FOTO score by 10 points or more.  Baseline: 30/100 target of 52 07/09/22: Target  74 Goal status: ACHIEVED    2.  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  Patient will regain right shoulder strength that is symmetrical to left shoulder to return to lifting activities to return to gym and repairing cars.  Baseline: Unable to test RUE    06/29/22:Shoulder Flex R/L 5/5, Shoulder Abd R/L 5/5,  Shoulder Ext R/L NT, Shoulder ER R/L 5/5, Shoulder IR R/L 5/5    Goal status: Partially met    4.  Patient will return to weight lifting activities with ability to perform overhead resisted activities with right shoulder to return to gym and reengage with fitness activities.   Baseline: NT  Goal status: Ongoing      PLAN: PT FREQUENCY: 1-2x/week   PT DURATION: 10 weeks   PLANNED INTERVENTIONS: Therapeutic exercises, Therapeutic activity, Neuromuscular re-education, Patient/Family education, Self Care, Joint mobilization, Joint manipulation, Dry Needling, Spinal manipulation, Spinal mobilization,  Cryotherapy, Moist heat, scar mobilization, Manual therapy, and Re-evaluation  PLAN FOR NEXT SESSION:  Continue to progress strengthening exercises for right shoulder and add to HEP. See biceps tenodesis protocol on desk for protocol for weeks 8-12. Continue with progressive loading of shoulder and parascapular strengthening exercises.    Bradly Chris PT, DPT  07/09/22, 9:40 AM

## 2022-07-14 ENCOUNTER — Encounter: Payer: Self-pay | Admitting: Physical Therapy

## 2022-07-14 ENCOUNTER — Ambulatory Visit: Payer: 59 | Attending: Orthopedic Surgery | Admitting: Physical Therapy

## 2022-07-14 DIAGNOSIS — G8929 Other chronic pain: Secondary | ICD-10-CM | POA: Insufficient documentation

## 2022-07-14 DIAGNOSIS — M67813 Other specified disorders of tendon, right shoulder: Secondary | ICD-10-CM | POA: Insufficient documentation

## 2022-07-14 DIAGNOSIS — M25511 Pain in right shoulder: Secondary | ICD-10-CM | POA: Insufficient documentation

## 2022-07-14 NOTE — Therapy (Signed)
OUTPATIENT PHYSICAL THERAPY TREATMENT NOTE   Patient Name: Todd [name] MRN: [id number] DOB:[birth year], 56 y.o., male Today's Date: 07/14/2022  PCP: Dr. Leonel [name]  REFERRING PROVIDER: Dr. Jarrett [name]   END OF SESSION:   PT End of Session - 07/14/22 1021     Visit Number 8    Number of Visits 20    Date for PT Re-Evaluation 07/23/22    Authorization Type UHC Medicare/Medicaid    PT Start Time 1017    PT Stop Time 1100    PT Time Calculation (min) 43 min    Activity Tolerance Patient tolerated treatment well    Behavior During Therapy [facility] for tasks assessed/performed               Past Medical History:  Diagnosis Date   Anxiety    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   Chronic left shoulder pain 02/15/2016   Depression    Diabetes mellitus without complication ([place])    Dislocation of right thumb 06/05/2016   Diverticulitis    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  Migraines 2-3x/yr.   HTN (hypertension) 05/19/2016   Hypertension    MDD (major depressive disorder), recurrent episode ([place]) 05/28/2016   Neck pain, bilateral 02/15/2016   Primary osteoarthritis of right knee 01/31/2016   and fingers   PTSD (post-traumatic stress disorder) 06/05/2016   Severe recurrent major depression without psychotic features ([place]) 05/19/2016   Sleep apnea    unable to tolerate CPAP   Substance induced mood disorder ([place]) 05/19/2016   Suicidal ideation 05/19/2016   Type 2 diabetes mellitus with complication, without long-term current use of insulin ([place]) 06/08/2017   Uncontrolled type 2 diabetes mellitus with hyperglycemia, without long-term current use of insulin ([place]) 03/12/2017   Vertigo    Past Surgical History:  Procedure Laterality Date   BACK SURGERY  11/209/18 and 10/08/17   x 2   CIRCUMCISION N/A 07/25/2020   Procedure: CIRCUMCISION ADULT;  Surgeon: Todd Espy, MD;  Location: [facility] ORS;  Service: Urology;  Laterality: N/A;   COLONOSCOPY WITH PROPOFOL N/A 01/31/2018   Procedure: COLONOSCOPY WITH PROPOFOL;  Surgeon: Todd Silvas, MD;  Location: [facility] ENDOSCOPY;  Service: Endoscopy;  Laterality: N/A;   ESOPHAGOGASTRODUODENOSCOPY (EGD) WITH PROPOFOL N/A 01/31/2018   Procedure: ESOPHAGOGASTRODUODENOSCOPY (EGD) WITH PROPOFOL;  Surgeon: Todd Silvas, MD;  Location: [facility] ENDOSCOPY;  Service: Endoscopy;  Laterality: N/A;   LEG AMPUTATION BELOW KNEE     left   NASAL SEPTOPLASTY W/ TURBINOPLASTY Bilateral 04/17/2020   Procedure: NASAL SEPTOPLASTY WITH INFERIORTURBINATE REDUCTION;  Surgeon: Todd Manner, MD;  Location: [place];  Service: ENT;  Laterality: Bilateral;  Diabetic - oral meds   SHOULDER SURGERY Left    Patient Active Problem List   Diagnosis Date Noted   Obesity (BMI 35.0-39.9 without comorbidity) 12/14/2019   Metatarsalgia of right foot 10/30/2019   Acquired absence of extremity 10/30/2019   Closed fracture of distal end of radius 05/31/2018   Radiculopathy of lumbosacral region 09/30/2017   Spondylolisthesis at L5-S1 level 09/30/2017   Morbid obesity ([place]) 09/06/2017   Hepatic steatosis 08/26/2017   Non-intractable vomiting with nausea 08/26/2017   Upper abdominal pain 08/26/2017   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   Gastroparesis 06/08/2017   History of noncompliance with medical treatment 06/08/2017   Diabetes mellitus type 2 in obese ([place]) 06/08/2017   Uncontrolled  type 2 diabetes mellitus with hyperglycemia ([place]) 03/12/2017   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   Dislocation of right thumb 06/05/2016   Anxiety disorder 06/05/2016   MDD (major depressive disorder), recurrent episode ([place]) 05/28/2016   Substance induced mood disorder ([place]) 05/19/2016   Severe recurrent major depression without psychotic features ([place]) 05/19/2016   Suicidal ideation 05/19/2016   Essential (primary) hypertension 05/19/2016    GERD (gastroesophageal reflux disease) 05/19/2016   Alcohol use disorder, mild, abuse 05/19/2016   Chronic left shoulder pain 02/15/2016   Neck pain, bilateral 02/15/2016   Primary osteoarthritis of right knee 01/31/2016    REFERRING DIAG: S/p right biceps tenodesis    THERAPY DIAG:  Chronic right shoulder pain  Biceps tendonosis of right shoulder  Rationale for Evaluation and Treatment Rehabilitation  PERTINENT HISTORY: Per Dr. Shelbie [name] note on 05/11/22:     Todd [name] is a 56 y.o. male status post right biceps tenodesis revision on 05/06/2022.   PLAN: Provided patient with physical therapy referral and post-op rehab protocol. Advised to continue sling use per protocol. Discussed gentle elbow range of motion to prevent stiffness. All questions were answered and he is in agreement with plan.  - Follow-up plan: 4 weeks - X-rays/plan at next visit: none.   PRECAUTIONS: No lifting with affected UE, no loading biceps   SUBJECTIVE:   Pt states he started having left forearm pain. He describes it feeling like a bee sting that spreads down his forearm.    PAIN:  Are you having pain? Yes: NPRS scale: 8/10 Pain location: AC joint on right shoulder near full shoulder flexion Pain description: Achy  Aggravating factors: Doing triceps exercises  Relieving factors: Not doing triceps exercises     OBJECTIVE: (objective measures completed at initial evaluation unless otherwise dated)  VITALS: BP 138/80 HR 77 SpO2 98   DIAGNOSTIC FINDINGS:      EXAM: XR SHOULDER 3 OR MORE VIEWS RIGHT  DATE: 03/13/2022 10:49 AM  ACCESSION: [id number] UN  DICTATED: 03/13/2022 11:03 AM  INTERPRETATION LOCATION: [place]   CLINICAL INDICATION: 56 years old Male with evaluate shoulder pain/previous repair  - M25.511 - Acute pain of right shoulder        COMPARISON: MRI dated 05/12/2021   TECHNIQUE: AP, Grashey, outlet, and axillary views of the right shoulder.   FINDINGS:  No acute  fractures. No malalignment. Sequelae of distal clavicle resection. Glenohumeral joint space is preserved with small glenoid and inferior humeral head osteophyte. Peripherally sclerotic and circumscribed lucent lesion within the proximal humerus measuring approximately 6.2 x 2.3 cm without endosteal scalloping, cortical breakthrough or periosteal reaction. Irregularity along the greater tuberosity with small adjacent osseous bodies compatible with rotator cuff tendinopathy. Partially imaged lungs are clear.    ///////////////////////////////////////////////////////////////////////////////////////////////////////////////////////////////////////////   Christianne Dolin, MD - 03/13/2022  Formatting of this note might be different from the original.  EXAM: XR SHOULDER 3 OR MORE VIEWS RIGHT  DATE: 03/13/2022 10:49 AM  ACCESSION: [id number] UN  DICTATED: 03/13/2022 11:03 AM  INTERPRETATION LOCATION: [place]   CLINICAL INDICATION: 56 years old Male with evaluate shoulder pain/previous repair  - M25.511 - Acute pain of right shoulder     COMPARISON: MRI dated 05/12/2021   TECHNIQUE: AP, Grashey, outlet, and axillary views of the right shoulder.   FINDINGS:  No acute fractures. No malalignment. Sequelae of distal clavicle resection. Glenohumeral joint space is preserved with small glenoid and inferior humeral head osteophyte. Peripherally sclerotic and circumscribed lucent lesion within the proximal humerus measuring  approximately 6.2 x 2.3 cm without endosteal scalloping, cortical breakthrough or periosteal reaction. Irregularity along the greater tuberosity with small adjacent osseous bodies compatible with rotator cuff tendinopathy. Partially imaged lungs are clear.   IMPRESSION:  No acute fracture or dislocation.   Redemonstrated peripherally sclerotic proximal humerus lesion compatible with a nonaggressive osseous lesion. This is better characterized on prior MRI dated 05/12/2021   PATIENT  SURVEYS:  FOTO 74/100   COGNITION: Overall cognitive status: Within functional limits for tasks assessed                                  SENSATION: WFL   POSTURE: Rounded shoulders    UPPER EXTREMITY ROM:       Active ROM Right eval Left eval  Shoulder flexion 0 180  Shoulder extension 0 60  Shoulder abduction 180  180  Shoulder adduction      Shoulder internal rotation 70 70  Shoulder external rotation 90 90  Elbow flexion 150 150  Elbow extension 60 60  Wrist flexion 80 80  Wrist extension 70 70  Wrist ulnar deviation 30 30  Wrist radial deviation 20 20  Wrist pronation 80 80  Wrist supination 80 80  (Blank rows = not tested)          UPPER EXTREMITY MMT:   MMT Right eval Left eval 06/29/22  Shoulder flexion NT 5 5  Shoulder extension NT  5 5  Shoulder abduction NT  5 5  Shoulder adduction       Shoulder internal rotation     5  Shoulder external rotation     5  Middle trapezius   NT   Lower trapezius   NT    Elbow flexion   5   Elbow extension   5   Wrist flexion   4+*   Wrist extension   4+*   Wrist ulnar deviation       Wrist radial deviation       Wrist pronation       Wrist supination       Grip strength (lbs)   Fair*   (Blank rows = not tested)   Patient had recent carpal tunnel surgery on both wris   SHOULDER SPECIAL TESTS:  Not applicable because s/p right biceps tendesis 05/06/22   JOINT MOBILITY TESTING:  N/a   PALPATION:  Anterior surface of right shoulder              TODAY'S TREATMENT:   07/14/22:  UBE seat at 9 for 5 min at level 2 resistance  Maudsley's Test on LUE: Negative  Right Biceps Curl #10 1 x 10  Right Biceps Curl #15 2 x 10  Right Resisted Supination #10 3 x 10   Right Bicep Curl to Shoulder Press #15 1 x 10  Right Wrist Flexion #5 3 x 15 Right Wrist Extension #5 3 x 15   07/09/22: UBE seat at 8 for 6 min   OMEGA Seated Rows #95 3 x 8  OMEGA Lat Pull Down #35 1 x 10  OMEGA Lat Pull Down #45 1 x 10   OMEGA Lat Pull Down #55 1 x 10   Wall Slides with pillow case 2 x 10   Bent over tricep extensions on RUE  1 x 10   07/05/22:  Resisted PNF D1 & D2 with Silver TB, 2x10 each direction OMEGA Seated Rows #35 1  x 10  OMEGA Seated Rows #45 1 x 10 OMEGA Seated Rows #55 1 x 10    OMEGA Seated Rows #85 1 x 10  OMEGA Seated Rows #115 1 x 4  OMEGA Seated Rows #105 1 x 6   OMEGA Chest Press #35 2 x 10 pt notes 7/10 pain after second set, so discontinued any subsequent planned attempts  OMEGA Lat Pull Down, #35, 3 x 10  Shoulder Press with RUE #10 3 x 15 Side Lying Shoulder ER #10 2 x10   07/01/22:  UBE seat at 8 for 6 min  OMEGA Seated Rows #25 1 x 10 OMEGA Seated Rows #35 1 x 10  OMEGA Seated Rows #55 1 x 10    OMEGA Seated Rows #75 1 x 10  OMEGA Seated Rows #105 1 x 10  OMEGA Chest Press #25 2 x 10  OMEGA Chest Press #35 3 x 10  Shoulder Press with RUE #10 3 x 10   Side Lying Shoulder ER #10 3 x 10      PATIENT EDUCATION: Education details: Form and technique for appropriate exercise.  Person educated: Patient Education method: Explanation, Demonstration, Verbal cues, and Handouts Education comprehension: verbalized understanding, returned demonstration, and verbal cues required   Educated patient on rehab protocol and precautions for post-op phase : [email or web address]     HOME EXERCISE PROGRAM: Access Code: [email or web address] URL: [email or web address] Date: 07/14/2022 Prepared by: Bradly Chris  Exercises - Sleeper Stretch  - 7 x weekly - 3 reps - 30 hold - Single Arm Shoulder Flexion with Dumbbell  - 1 x daily - 3 x weekly - 1 sets - 30 reps - Shoulder Abduction with Dumbbells - Thumbs Up  - 1 x daily - 3 x weekly - 1 sets - 30 reps - Bicep Curl to Shoulder Press with Dumbbells  - 1 x daily - 3 x weekly - 30 reps - Sidelying Shoulder ER with  Towel and Dumbbell  - 1 x daily - 3 x weekly - 3 sets - 10 reps - Shoulder Overhead Press in Flexion with Dumbbells  - 1 x daily - 3 x weekly - 3 sets - 10 reps - Scapular Wall Slides  - 1 x daily - 3 x weekly - 3 sets - 10 reps - Standing Bent Over on Chair Single Arm Tricep Extension with Dumbbell  - 1 x daily - 3 x weekly - 3 sets - 10 reps - Wrist Flexion with Dumbbell  - 1 x daily - 3 x weekly - 3 sets - 15 reps - Seated Wrist Extension with Dumbbell  - 1 x daily - 3 x weekly - 3 sets - 15 reps  ASSESSMENT:   CLINICAL IMPRESSION:  Pt presents s/p 6 weeks right biceps tendinosis. He continues to exhibit an improvement in parascapular and shoulder strength with ability to perform exercises like bicep curls with increased resistance without experiencing increased pain. Unable to rule any pathology for left forearm pain, but able to rule out lateral epicondylitis. Pt will continue to benefit from skilled therapy to address remaining deficits in order to improve overall QoL and return to PLOF.   OBJECTIVE IMPAIRMENTS decreased ROM, decreased strength, impaired UE functional use, and pain.    ACTIVITY LIMITATIONS carrying, lifting, dressing, self feeding, reach over head, and hygiene/grooming   PARTICIPATION LIMITATIONS: meal prep, cleaning, shopping, occupation, and yard work   PERSONAL FACTORS 3+ comorbidities: T2DM, PTSD, Sleep apnea  are also affecting patient's functional outcome.  REHAB POTENTIAL: Good   CLINICAL DECISION MAKING: Stable/uncomplicated   EVALUATION COMPLEXITY: Low     GOALS:  Goals reviewed with patient? No   SHORT TERM GOALS: Target date: 05/28/2022    Pt will be independent with HEP in order to improve strength and balance in order to decrease fall risk and improve function at home and work. Baseline: Performing independently  Goal status: Ongoing    2.  Patient will demonstrate understanding of precautions listed in shoulder bicep tenodesis protocol to  improve post surgical outcomes.  Baseline: Able to verbalize precautions for the phase of first four weeks  Goal status: ACHIEVED      LONG TERM GOALS: Target date: 07/23/2022     Patient will have improved function and activity level as evidenced by an increase in FOTO score by 10 points or more.  Baseline: 30/100 target of 52 07/09/22: Target  74 Goal status: ACHIEVED    2.  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  Patient will regain right shoulder strength that is symmetrical to left shoulder to return to lifting activities to return to gym and repairing cars.  Baseline: Unable to test RUE    06/29/22:Shoulder Flex R/L 5/5, Shoulder Abd R/L 5/5,  Shoulder Ext R/L NT, Shoulder ER R/L 5/5, Shoulder IR R/L 5/5    Goal status: Partially met    4.  Patient will return to weight lifting activities with ability to perform overhead resisted activities with right shoulder to return to gym and reengage with fitness activities.   Baseline: NT  Goal status: Ongoing      PLAN: PT FREQUENCY: 1-2x/week   PT DURATION: 10 weeks   PLANNED INTERVENTIONS: Therapeutic exercises, Therapeutic activity, Neuromuscular re-education, Patient/Family education, Self Care, Joint mobilization, Joint manipulation, Dry Needling, Spinal manipulation, Spinal mobilization, Cryotherapy, Moist heat, scar mobilization, Manual therapy, and Re-evaluation  PLAN FOR NEXT SESSION:  Continue to progress strengthening exercises for right shoulder and add to HEP. See biceps tenodesis protocol on desk for protocol for weeks 8-12. Continue with progressive  loading of shoulder and parascapular strengthening exercises.    Bradly Chris PT, DPT  07/14/22, 10:21 AM

## 2022-07-16 ENCOUNTER — Encounter: Payer: Self-pay | Admitting: Physical Therapy

## 2022-07-16 ENCOUNTER — Ambulatory Visit: Payer: 59 | Admitting: Physical Therapy

## 2022-07-16 DIAGNOSIS — M67813 Other specified disorders of tendon, right shoulder: Secondary | ICD-10-CM

## 2022-07-16 DIAGNOSIS — M25511 Pain in right shoulder: Secondary | ICD-10-CM | POA: Diagnosis not present

## 2022-07-16 DIAGNOSIS — G8929 Other chronic pain: Secondary | ICD-10-CM

## 2022-07-16 NOTE — Therapy (Addendum)
OUTPATIENT PHYSICAL THERAPY TREATMENT NOTE   Patient Name: Todd [name] MRN: [id number] DOB:1965/10/18, 56 y.o., male Today's Date: 07/16/2022  PCP: Dr. Leonel Ramsay  REFERRING PROVIDER: Dr. Jarrett Ables   END OF SESSION:   PT End of Session - 07/16/22 1022     Visit Number 9    Number of Visits 20    Date for PT Re-Evaluation 07/23/22    Authorization Type UHC Medicare/Medicaid    Progress Note Due on Visit 10    PT Start Time 1020    PT Stop Time 1100    PT Time Calculation (min) 40 min    Activity Tolerance Patient tolerated treatment well    Behavior During Therapy [facility] for tasks assessed/performed               Past Medical History:  Diagnosis Date   Anxiety    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   Chronic left shoulder pain 02/15/2016   Depression    Diabetes mellitus without complication ([place])    Dislocation of right thumb 06/05/2016   Diverticulitis    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  Migraines 2-3x/yr.   HTN (hypertension) 05/19/2016   Hypertension    MDD (major depressive disorder), recurrent episode ([place]) 05/28/2016   Neck pain, bilateral 02/15/2016   Primary osteoarthritis of right knee 01/31/2016   and fingers   PTSD (post-traumatic stress disorder) 06/05/2016   Severe recurrent major depression without psychotic features ([place]) 05/19/2016   Sleep apnea    unable to tolerate CPAP   Substance induced mood disorder ([place]) 05/19/2016   Suicidal ideation 05/19/2016   Type 2 diabetes mellitus with complication, without long-term current use of insulin ([place]) 06/08/2017   Uncontrolled type 2 diabetes mellitus with hyperglycemia, without long-term current use of insulin ([place]) 03/12/2017   Vertigo    Past Surgical History:  Procedure Laterality Date   BACK SURGERY  11/209/18 and 10/08/17   x 2   CIRCUMCISION N/A 07/25/2020   Procedure:  CIRCUMCISION ADULT;  Surgeon: Hollice Espy, MD;  Location: [facility] ORS;  Service: Urology;  Laterality: N/A;   COLONOSCOPY WITH PROPOFOL N/A 01/31/2018   Procedure: COLONOSCOPY WITH PROPOFOL;  Surgeon: Manya Silvas, MD;  Location: [facility] ENDOSCOPY;  Service: Endoscopy;  Laterality: N/A;   ESOPHAGOGASTRODUODENOSCOPY (EGD) WITH PROPOFOL N/A 01/31/2018   Procedure: ESOPHAGOGASTRODUODENOSCOPY (EGD) WITH PROPOFOL;  Surgeon: Manya Silvas, MD;  Location: [facility] ENDOSCOPY;  Service: Endoscopy;  Laterality: N/A;   LEG AMPUTATION BELOW KNEE     left   NASAL SEPTOPLASTY W/ TURBINOPLASTY Bilateral 04/17/2020   Procedure: NASAL SEPTOPLASTY WITH INFERIORTURBINATE REDUCTION;  Surgeon: Carloyn Manner, MD;  Location: [place];  Service: ENT;  Laterality: Bilateral;  Diabetic - oral meds   SHOULDER SURGERY Left    Patient Active Problem List   Diagnosis Date Noted   Obesity (BMI 35.0-39.9 without comorbidity) 12/14/2019   Metatarsalgia of right foot 10/30/2019   Acquired absence of extremity 10/30/2019   Closed fracture of distal end of radius 05/31/2018   Radiculopathy of lumbosacral region 09/30/2017   Spondylolisthesis at L5-S1 level 09/30/2017   Morbid obesity ([place]) 09/06/2017   Hepatic steatosis 08/26/2017   Non-intractable vomiting with nausea 08/26/2017   Upper abdominal pain 08/26/2017   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   Gastroparesis 06/08/2017   History of noncompliance with medical treatment 06/08/2017   Diabetes mellitus  type 2 in obese ([place]) 06/08/2017   Uncontrolled type 2 diabetes mellitus with hyperglycemia ([place]) 03/12/2017   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   Dislocation of right thumb 06/05/2016   Anxiety disorder 06/05/2016   MDD (major depressive disorder), recurrent episode ([place]) 05/28/2016   Substance induced mood disorder ([place]) 05/19/2016   Severe recurrent major depression without psychotic features ([place]) 05/19/2016   Suicidal ideation 05/19/2016    Essential (primary) hypertension 05/19/2016   GERD (gastroesophageal reflux disease) 05/19/2016   Alcohol use disorder, mild, abuse 05/19/2016   Chronic left shoulder pain 02/15/2016   Neck pain, bilateral 02/15/2016   Primary osteoarthritis of right knee 01/31/2016    REFERRING DIAG: S/p right biceps tenodesis    THERAPY DIAG:  Chronic right shoulder pain  Biceps tendonosis of right shoulder  Rationale for Evaluation and Treatment Rehabilitation  PERTINENT HISTORY: Per Dr. Shelbie Ammons note on 05/11/22:     Todd [name] is a 56 y.o. male status post right biceps tenodesis revision on 05/06/2022.   PLAN: Provided patient with physical therapy referral and post-op rehab protocol. Advised to continue sling use per protocol. Discussed gentle elbow range of motion to prevent stiffness. All questions were answered and he is in agreement with plan.  - Follow-up plan: 4 weeks - X-rays/plan at next visit: none.   PRECAUTIONS: No lifting with affected UE, no loading biceps   SUBJECTIVE: Pt reports increased soreness in left elbow on anterior surface which attributes more to muscle soreness than pain.    PAIN:  Are you having pain? No   OBJECTIVE: (objective measures completed at initial evaluation unless otherwise dated)  VITALS: BP 138/80 HR 77 SpO2 98   DIAGNOSTIC FINDINGS:      EXAM: XR SHOULDER 3 OR MORE VIEWS RIGHT  DATE: 03/13/2022 10:49 AM  ACCESSION: [id number]  DICTATED: 03/13/2022 11:03 AM  INTERPRETATION LOCATION: [place]   CLINICAL INDICATION: 56 years old Male with evaluate shoulder pain/previous repair  - M25.511 - Acute pain of right shoulder        COMPARISON: MRI dated 05/12/2021   TECHNIQUE: AP, Grashey, outlet, and axillary views of the right shoulder.   FINDINGS:  No acute fractures. No malalignment. Sequelae of distal clavicle resection. Glenohumeral joint space is preserved with small glenoid and inferior humeral head osteophyte. Peripherally  sclerotic and circumscribed lucent lesion within the proximal humerus measuring approximately 6.2 x 2.3 cm without endosteal scalloping, cortical breakthrough or periosteal reaction. Irregularity along the greater tuberosity with small adjacent osseous bodies compatible with rotator cuff tendinopathy. Partially imaged lungs are clear.    ///////////////////////////////////////////////////////////////////////////////////////////////////////////////////////////////////////////   Christianne Dolin, MD - 03/13/2022  Formatting of this note might be different from the original.  EXAM: XR SHOULDER 3 OR MORE VIEWS RIGHT  DATE: 03/13/2022 10:49 AM  ACCESSION: [id number]  DICTATED: 03/13/2022 11:03 AM  INTERPRETATION LOCATION: [place]   CLINICAL INDICATION: 56 years old Male with evaluate shoulder pain/previous repair  - M25.511 - Acute pain of right shoulder     COMPARISON: MRI dated 05/12/2021   TECHNIQUE: AP, Grashey, outlet, and axillary views of the right shoulder.   FINDINGS:  No acute fractures. No malalignment. Sequelae of distal clavicle resection. Glenohumeral joint space is preserved with small glenoid and inferior humeral head osteophyte. Peripherally sclerotic and circumscribed lucent lesion within the proximal humerus measuring approximately 6.2 x 2.3 cm without endosteal scalloping, cortical breakthrough or periosteal reaction. Irregularity along the greater tuberosity with small adjacent osseous bodies compatible with rotator cuff tendinopathy.  Partially imaged lungs are clear.   IMPRESSION:  No acute fracture or dislocation.   Redemonstrated peripherally sclerotic proximal humerus lesion compatible with a nonaggressive osseous lesion. This is better characterized on prior MRI dated 05/12/2021   PATIENT SURVEYS:  FOTO 74/100   COGNITION: Overall cognitive status: Within functional limits for tasks assessed                                  SENSATION: WFL   POSTURE: Rounded  shoulders    UPPER EXTREMITY ROM:       Active ROM Right eval Left eval  Shoulder flexion 0 180  Shoulder extension 0 60  Shoulder abduction 180  180  Shoulder adduction      Shoulder internal rotation 70 70  Shoulder external rotation 90 90  Elbow flexion 150 150  Elbow extension 60 60  Wrist flexion 80 80  Wrist extension 70 70  Wrist ulnar deviation 30 30  Wrist radial deviation 20 20  Wrist pronation 80 80  Wrist supination 80 80  (Blank rows = not tested)          UPPER EXTREMITY MMT:   MMT Right eval Left eval 06/29/22  Shoulder flexion NT 5 5  Shoulder extension NT  5 5  Shoulder abduction NT  5 5  Shoulder adduction       Shoulder internal rotation     5  Shoulder external rotation     5  Middle trapezius   NT   Lower trapezius   NT    Elbow flexion   5   Elbow extension   5   Wrist flexion   4+*   Wrist extension   4+*   Wrist ulnar deviation       Wrist radial deviation       Wrist pronation       Wrist supination       Grip strength (lbs)   Fair*   (Blank rows = not tested)   Patient had recent carpal tunnel surgery on both wris   SHOULDER SPECIAL TESTS:  Not applicable because s/p right biceps tendesis 05/06/22   JOINT MOBILITY TESTING:  N/a   PALPATION:  Anterior surface of right shoulder              TODAY'S TREATMENT:   07/16/22:  UBE seat at 9 for 5 min at level 2 resistance with RUE only  Discussion about callous caused by below the knee amputation and reason for left hip IR.  D2 PNF Pattern with RUE with Green TB 3 x 10    Right Bicep Curl to Shoulder Press #15 1 x 10  Right Wrist Flexion #5 3 x 15 Right Wrist Extension #5 3 x 15   07/14/22:  UBE seat at 9 for 5 min at level 2 resistance  Maudsley's Test on LUE: Negative  Right Biceps Curl #10 1 x 10  Right Biceps Curl #15 2 x 10  Right Resisted Supination #10 3 x 10   Right Bicep Curl to Shoulder Press #15 1 x 10  Right Wrist Flexion #5 3 x 15 Right Wrist Extension #5 3  x 15   07/09/22: UBE seat at 8 for 6 min   OMEGA Seated Rows #95 3 x 8  OMEGA Lat Pull Down #35 1 x 10  OMEGA Lat Pull Down #45 1 x 10  OMEGA Lat Pull Down #  55 1 x 10   Wall Slides with pillow case 2 x 10   Bent over tricep extensions on RUE  1 x 10   07/05/22:  Resisted PNF D1 & D2 with Silver TB, 2x10 each direction OMEGA Seated Rows #35 1 x 10  OMEGA Seated Rows #45 1 x 10 OMEGA Seated Rows #55 1 x 10    OMEGA Seated Rows #85 1 x 10  OMEGA Seated Rows #115 1 x 4  OMEGA Seated Rows #105 1 x 6   OMEGA Chest Press #35 2 x 10 pt notes 7/10 pain after second set, so discontinued any subsequent planned attempts  OMEGA Lat Pull Down, #35, 3 x 10  Shoulder Press with RUE #10 3 x 15 Side Lying Shoulder ER #10 2 x10   07/01/22:  UBE seat at 8 for 6 min  OMEGA Seated Rows #25 1 x 10 OMEGA Seated Rows #35 1 x 10  OMEGA Seated Rows #55 1 x 10    OMEGA Seated Rows #75 1 x 10  OMEGA Seated Rows #105 1 x 10  OMEGA Chest Press #25 2 x 10  OMEGA Chest Press #35 3 x 10  Shoulder Press with RUE #10 3 x 10   Side Lying Shoulder ER #10 3 x 10      PATIENT EDUCATION: Education details: Form and technique for appropriate exercise.  Person educated: Patient Education method: Explanation, Demonstration, Verbal cues, and Handouts Education comprehension: verbalized understanding, returned demonstration, and verbal cues required   Educated patient on rehab protocol and precautions for post-op phase : [email or web address]     HOME EXERCISE PROGRAM: Access Code: [email or web address] URL: [email or web address] Date: 07/16/2022 Prepared by: Bradly Chris  Exercises - Sleeper Stretch  - 7 x weekly - 3 reps - 30 hold - Single Arm Shoulder Flexion with Dumbbell  - 1 x daily - 3 x weekly - 1 sets - 30 reps - Shoulder Abduction with Dumbbells - Thumbs Up  - 1 x daily - 3  x weekly - 1 sets - 30 reps - Bicep Curl to Shoulder Press with Dumbbells  - 1 x daily - 3 x weekly - 30 reps - Sidelying Shoulder ER with Towel and Dumbbell  - 1 x daily - 3 x weekly - 3 sets - 10 reps - Shoulder Overhead Press in Flexion with Dumbbells  - 1 x daily - 3 x weekly - 3 sets - 10 reps - Scapular Wall Slides  - 1 x daily - 3 x weekly - 3 sets - 10 reps - Standing Bent Over on Chair Single Arm Tricep Extension with Dumbbell  - 1 x daily - 3 x weekly - 3 sets - 10 reps - Wrist Flexion with Dumbbell  - 1 x daily - 3 x weekly - 3 sets - 15 reps - Seated Wrist Extension with Dumbbell  - 1 x daily - 3 x weekly - 3 sets - 15 reps - Face Pulls  - 1 x daily - 3 x weekly - 3 sets - 10 reps - Shoulder PNF D2 with Resistance  - 1 x daily - 3 x weekly - 3 sets - 10 reps  ASSESSMENT:   CLINICAL IMPRESSION:  Pt presents s/p 6 weeks right biceps tendinosis. He continues to exhibit an improvement in parascapular and shoulder strength with ability to perform resisted parascapular exercises with overhead movement. Given pt's progress, PT will decrease frequency to 1x per week instead of 2 x per week. Pt  will continue to benefit from skilled therapy to address remaining deficits in order to improve overall QoL and return to PLOF.   OBJECTIVE IMPAIRMENTS decreased ROM, decreased strength, impaired UE functional use, and pain.    ACTIVITY LIMITATIONS carrying, lifting, dressing, self feeding, reach over head, and hygiene/grooming   PARTICIPATION LIMITATIONS: meal prep, cleaning, shopping, occupation, and yard work   PERSONAL FACTORS 3+ comorbidities: T2DM, PTSD, Sleep apnea  are also affecting patient's functional outcome.    REHAB POTENTIAL: Good   CLINICAL DECISION MAKING: Stable/uncomplicated   EVALUATION COMPLEXITY: Low     GOALS:  Goals reviewed with patient? No   SHORT TERM GOALS: Target date: 05/28/2022    Pt will be independent with HEP in order to improve strength and balance in  order to decrease fall risk and improve function at home and work. Baseline: Performing independently  Goal status: Ongoing    2.  Patient will demonstrate understanding of precautions listed in shoulder bicep tenodesis protocol to improve post surgical outcomes.  Baseline: Able to verbalize precautions for the phase of first four weeks  Goal status: ACHIEVED      LONG TERM GOALS: Target date: 07/23/2022     Patient will have improved function and activity level as evidenced by an increase in FOTO score by 10 points or more.  Baseline: 30/100 target of 52 07/09/22: Target  74 Goal status: ACHIEVED    2.  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  Patient will regain right shoulder strength that is symmetrical to left shoulder to return to lifting activities to return to gym and repairing cars.  Baseline: Unable to test RUE    06/29/22:Shoulder Flex R/L 5/5, Shoulder Abd R/L 5/5,  Shoulder Ext R/L NT, Shoulder ER R/L 5/5, Shoulder IR R/L 5/5    Goal status: Partially met    4.  Patient will return to weight lifting activities with ability to perform overhead resisted activities with right shoulder to return to gym and reengage with fitness activities.   Baseline: NT  Goal status: Ongoing      PLAN: PT FREQUENCY: 1-2x/week   PT DURATION: 10 weeks   PLANNED INTERVENTIONS: Therapeutic exercises, Therapeutic activity, Neuromuscular re-education, Patient/Family education, Self Care, Joint mobilization, Joint manipulation, Dry Needling, Spinal manipulation, Spinal mobilization, Cryotherapy, Moist heat, scar mobilization,  Manual therapy, and Re-evaluation  PLAN FOR NEXT SESSION:  Reassess goals. See biceps tenodesis protocol on desk for protocol for weeks 8-12. Continue with progressive loading of shoulder and parascapular strengthening exercises.    Bradly Chris PT, DPT  07/16/22, 10:23 AM

## 2022-07-21 ENCOUNTER — Ambulatory Visit: Payer: 59 | Admitting: Physical Therapy

## 2022-07-23 ENCOUNTER — Encounter: Payer: Self-pay | Admitting: Physical Therapy

## 2022-07-23 ENCOUNTER — Ambulatory Visit: Payer: 59 | Admitting: Physical Therapy

## 2022-07-23 DIAGNOSIS — M25511 Pain in right shoulder: Secondary | ICD-10-CM | POA: Diagnosis not present

## 2022-07-23 DIAGNOSIS — G8929 Other chronic pain: Secondary | ICD-10-CM

## 2022-07-23 DIAGNOSIS — M67813 Other specified disorders of tendon, right shoulder: Secondary | ICD-10-CM

## 2022-07-23 NOTE — Therapy (Addendum)
OUTPATIENT PHYSICAL THERAPY PROGRESS NOTE/ Re-certification   Dates of Reporting: 05/14/22-07/23/22  Patient Name: Todd [name] MRN: [id number] DOB:1966-07-31, 56 y.o., male Today's Date: 07/23/2022  PCP: Dr. Leonel Ramsay  REFERRING PROVIDER: Dr. Jarrett Ables   END OF SESSION:   PT End of Session - 07/23/22 1018     Visit Number 10    Number of Visits 20    Date for PT Re-Evaluation 07/23/22    Authorization Type UHC Medicare/Medicaid    Authorization Time Period 07/24/22-08/24/22   Progress Note Due on Visit 10    PT Start Time 1015    PT Stop Time 1100    PT Time Calculation (min) 45 min    Activity Tolerance Patient tolerated treatment well    Behavior During Therapy [facility] for tasks assessed/performed               Past Medical History:  Diagnosis Date   Anxiety    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   Chronic left shoulder pain 02/15/2016   Depression    Diabetes mellitus without complication ([place])    Dislocation of right thumb 06/05/2016   Diverticulitis    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  Migraines 2-3x/yr.   HTN (hypertension) 05/19/2016   Hypertension    MDD (major depressive disorder), recurrent episode ([place]) 05/28/2016   Neck pain, bilateral 02/15/2016   Primary osteoarthritis of right knee 01/31/2016   and fingers   PTSD (post-traumatic stress disorder) 06/05/2016   Severe recurrent major depression without psychotic features ([place]) 05/19/2016   Sleep apnea    unable to tolerate CPAP   Substance induced mood disorder ([place]) 05/19/2016   Suicidal ideation 05/19/2016   Type 2 diabetes mellitus with complication, without long-term current use of insulin ([place]) 06/08/2017   Uncontrolled type 2 diabetes mellitus with hyperglycemia, without long-term current use of insulin ([place]) 03/12/2017   Vertigo    Past Surgical History:  Procedure  Laterality Date   BACK SURGERY  11/209/18 and 10/08/17   x 2   CIRCUMCISION N/A 07/25/2020   Procedure: CIRCUMCISION ADULT;  Surgeon: Hollice Espy, MD;  Location: [facility] ORS;  Service: Urology;  Laterality: N/A;   COLONOSCOPY WITH PROPOFOL N/A 01/31/2018   Procedure: COLONOSCOPY WITH PROPOFOL;  Surgeon: Manya Silvas, MD;  Location[facility] ENDOSCOPY;  Service: Endoscopy;  Laterality: N/A;   ESOPHAGOGASTRODUODENOSCOPY (EGD) WITH PROPOFOL N/A 01/31/2018   Procedure: ESOPHAGOGASTRODUODENOSCOPY (EGD) WITH PROPOFOL;  Surgeon: Manya Silvas, MD;  Location: [facility] ENDOSCOPY;  Service: Endoscopy;  Laterality: N/A;   LEG AMPUTATION BELOW KNEE     left   NASAL SEPTOPLASTY W/ TURBINOPLASTY Bilateral 04/17/2020   Procedure: NASAL SEPTOPLASTY WITH INFERIORTURBINATE REDUCTION;  Surgeon: Carloyn Manner, MD;  Location: [place];  Service: ENT;  Laterality: Bilateral;  Diabetic - oral meds   SHOULDER SURGERY Left    Patient Active Problem List   Diagnosis Date Noted   Obesity (BMI 35.0-39.9 without comorbidity) 12/14/2019   Metatarsalgia of right foot 10/30/2019   Acquired absence of extremity 10/30/2019   Closed fracture of distal end of radius 05/31/2018   Radiculopathy of lumbosacral region 09/30/2017   Spondylolisthesis at L5-S1 level 09/30/2017   Morbid obesity ([place]) 09/06/2017   Hepatic steatosis 08/26/2017   Non-intractable vomiting with nausea 08/26/2017   Upper abdominal pain 08/26/2017   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   Gastroparesis 06/08/2017  History of noncompliance with medical treatment 06/08/2017   Diabetes mellitus type 2 in obese ([place]) 06/08/2017   Uncontrolled type 2 diabetes mellitus with hyperglycemia ([place]) 03/12/2017   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   Dislocation of right thumb 06/05/2016   Anxiety disorder 06/05/2016   MDD (major depressive disorder), recurrent episode ([place]) 05/28/2016   Substance induced mood disorder ([place]) 05/19/2016   Severe  recurrent major depression without psychotic features ([place]) 05/19/2016   Suicidal ideation 05/19/2016   Essential (primary) hypertension 05/19/2016   GERD (gastroesophageal reflux disease) 05/19/2016   Alcohol use disorder, mild, abuse 05/19/2016   Chronic left shoulder pain 02/15/2016   Neck pain, bilateral 02/15/2016   Primary osteoarthritis of right knee 01/31/2016    REFERRING DIAG: S/p right biceps tenodesis    THERAPY DIAG:  Chronic right shoulder pain  Biceps tendonosis of right shoulder  Rationale for Evaluation and Treatment Rehabilitation  PERTINENT HISTORY: Per Dr. Shelbie Ammons note on 05/11/22:     Todd [name] is a 56 y.o. male status post right biceps tenodesis revision on 05/06/2022.   PLAN: Provided patient with physical therapy referral and post-op rehab protocol. Advised to continue sling use per protocol. Discussed gentle elbow range of motion to prevent stiffness. All questions were answered and he is in agreement with plan.  - Follow-up plan: 4 weeks - X-rays/plan at next visit: none.   PRECAUTIONS: No lifting with affected UE, no loading biceps   SUBJECTIVE: Pt reports that he will not need a left AKA and instead likely need a total hip replacement. He has been very tired as of late because his wife has also received a recent total hip this past week and he has been caring for her.     PAIN:  Are you having pain? No   OBJECTIVE: (objective measures completed at initial evaluation unless otherwise dated)  VITALS: BP 138/80 HR 77 SpO2 98   DIAGNOSTIC FINDINGS:      EXAM: XR SHOULDER 3 OR MORE VIEWS RIGHT  DATE: 03/13/2022 10:49 AM  ACCESSION: [id number]  DICTATED: 03/13/2022 11:03 AM  INTERPRETATION LOCATION: [place]   CLINICAL INDICATION: 56 years old Male with evaluate shoulder pain/previous repair  - M25.511 - Acute pain of right shoulder        COMPARISON: MRI dated 05/12/2021   TECHNIQUE: AP, Grashey, outlet, and axillary views of the  right shoulder.   FINDINGS:  No acute fractures. No malalignment. Sequelae of distal clavicle resection. Glenohumeral joint space is preserved with small glenoid and inferior humeral head osteophyte. Peripherally sclerotic and circumscribed lucent lesion within the proximal humerus measuring approximately 6.2 x 2.3 cm without endosteal scalloping, cortical breakthrough or periosteal reaction. Irregularity along the greater tuberosity with small adjacent osseous bodies compatible with rotator cuff tendinopathy. Partially imaged lungs are clear.    ///////////////////////////////////////////////////////////////////////////////////////////////////////////////////////////////////////////   Christianne Dolin, MD - 03/13/2022  Formatting of this note might be different from the original.  EXAM: XR SHOULDER 3 OR MORE VIEWS RIGHT  DATE: 03/13/2022 10:49 AM  ACCESSION: [id number] UN  DICTATED: 03/13/2022 11:03 AM  INTERPRETATION LOCATION: [place]   CLINICAL INDICATION: 56 years old Male with evaluate shoulder pain/previous repair  - M25.511 - Acute pain of right shoulder     COMPARISON: MRI dated 05/12/2021   TECHNIQUE: AP, Grashey, outlet, and axillary views of the right shoulder.   FINDINGS:  No acute fractures. No malalignment. Sequelae of distal clavicle resection. Glenohumeral joint space is preserved with small glenoid and inferior humeral head  osteophyte. Peripherally sclerotic and circumscribed lucent lesion within the proximal humerus measuring approximately 6.2 x 2.3 cm without endosteal scalloping, cortical breakthrough or periosteal reaction. Irregularity along the greater tuberosity with small adjacent osseous bodies compatible with rotator cuff tendinopathy. Partially imaged lungs are clear.   IMPRESSION:  No acute fracture or dislocation.   Redemonstrated peripherally sclerotic proximal humerus lesion compatible with a nonaggressive osseous lesion. This is better characterized on  prior MRI dated 05/12/2021   PATIENT SURVEYS:  FOTO 74/100   COGNITION: Overall cognitive status: Within functional limits for tasks assessed                                  SENSATION: WFL   POSTURE: Rounded shoulders    UPPER EXTREMITY ROM:       Active ROM Right eval Left eval  Shoulder flexion 0 180  Shoulder extension 0 60  Shoulder abduction 180  180  Shoulder adduction      Shoulder internal rotation 70 70  Shoulder external rotation 90 90  Elbow flexion 150 150  Elbow extension 60 60  Wrist flexion 80 80  Wrist extension 70 70  Wrist ulnar deviation 30 30  Wrist radial deviation 20 20  Wrist pronation 80 80  Wrist supination 80 80  (Blank rows = not tested)          UPPER EXTREMITY MMT:   MMT Right eval Left eval 06/29/22 Right  07/23/22 Right   Shoulder flexion NT 5 5 5   Shoulder extension NT  5 5 5   Shoulder abduction NT  5 5 5   Shoulder adduction        Shoulder internal rotation     5 5  Shoulder external rotation     5 5  Middle trapezius   NT  5  Lower trapezius   NT   4+  Elbow flexion   5  5  Elbow extension   5    Wrist flexion   4+*    Wrist extension   4+*    Wrist ulnar deviation        Wrist radial deviation        Wrist pronation        Wrist supination        Grip strength (lbs)   Fair*    (Blank rows = not tested)   Patient had recent carpal tunnel surgery on both wris   SHOULDER SPECIAL TESTS:  Not applicable because s/p right biceps tendesis 05/06/22   JOINT MOBILITY TESTING:  N/a   PALPATION:  Anterior surface of right shoulder              TODAY'S TREATMENT:   07/23/22:  UBE seat at 9 for 5 min at level 3 resistance with RUE only  Shoulder Flex R/L 5/5, Shoulder Abd R/L 5/5,  Shoulder Ext R/L 5/5 , Shoulder ER R/L 5/5, Shoulder IR R/L 5/5 Mid Trap R 5, Rhomboid R 5, Lower Trap R 4+, Ext R 4+ Bent over T's with #5 DB thumbs up for mid trap 3 x 10  Bent over T's with #5 DB thumbs down for rhomboid 3 x 10  Bent  over W's with #5 DB 3 x 10  Supine Tricep Extension with RUE #5 3 x 10    07/16/22:  UBE seat at 9 for 5 min at level 2 resistance with RUE only  Discussion about callous  caused by below the knee amputation and reason for left hip IR.  D2 PNF Pattern with RUE with Green TB 3 x 10    Right Bicep Curl to Shoulder Press #15 1 x 10  Right Wrist Flexion #5 3 x 15 Right Wrist Extension #5 3 x 15   07/14/22:  UBE seat at 9 for 5 min at level 2 resistance  Maudsley's Test on LUE: Negative  Right Biceps Curl #10 1 x 10  Right Biceps Curl #15 2 x 10  Right Resisted Supination #10 3 x 10   Right Bicep Curl to Shoulder Press #15 1 x 10  Right Wrist Flexion #5 3 x 15 Right Wrist Extension #5 3 x 15   07/09/22: UBE seat at 8 for 6 min   OMEGA Seated Rows #95 3 x 8  OMEGA Lat Pull Down #35 1 x 10  OMEGA Lat Pull Down #45 1 x 10  OMEGA Lat Pull Down #55 1 x 10   Wall Slides with pillow case 2 x 10   Bent over tricep extensions on RUE  1 x 10   PATIENT EDUCATION: Education details: Form and technique for appropriate exercise.  Person educated: Patient Education method: Explanation, Demonstration, Verbal cues, and Handouts Education comprehension: verbalized understanding, returned demonstration, and verbal cues required   Educated patient on rehab protocol and precautions for post-op phase : [email or web address]     HOME EXERCISE PROGRAM: Access Code: [email or web address] URL: [email or web address] Date: 07/23/2022 Prepared by: Bradly Chris  Exercises - Sleeper Stretch  - 7 x weekly - 3 reps - 30 hold - Single Arm Shoulder Flexion with Dumbbell  - 1 x daily - 3 x weekly - 1 sets - 30 reps - Shoulder Abduction with Dumbbells - Thumbs Up  - 1 x daily - 3 x weekly - 1 sets - 30 reps - Bicep Curl to Shoulder Press with Dumbbells  - 1 x daily - 3 x weekly - 30  reps - Sidelying Shoulder ER with Towel and Dumbbell  - 1 x daily - 3 x weekly - 3 sets - 10 reps - Shoulder Overhead Press in Flexion with Dumbbells  - 1 x daily - 3 x weekly - 3 sets - 10 reps - Scapular Wall Slides  - 1 x daily - 3 x weekly - 3 sets - 10 reps - Standing Bent Over on Chair Single Arm Tricep Extension with Dumbbell  - 1 x daily - 3 x weekly - 3 sets - 10 reps - Wrist Flexion with Dumbbell  - 1 x daily - 3 x weekly - 3 sets - 15 reps - Seated Wrist Extension with Dumbbell  - 1 x daily - 3 x weekly - 3 sets - 15 reps - Prone Shoulder W on Swiss Ball  - 3 x weekly - 3 sets - 10 reps - Single Arm Bent Over Shoulder Horizontal Abduction with Dumbbell - Palm Down  - 3 x weekly - 3 sets - 10 reps  ASSESSMENT:   CLINICAL IMPRESSION:  Pt presents s/p 7 weeks right biceps tendinosis. Pt exhibits an improvement with right shoulder strength having met all strength and ROM of goals for his plan of care. He is nearing the end of his plan of care with the only remaining goal being transitioning to weight lifting. Next few sessions to focus on developing HEP for patient to complete independently. Pt will continue to benefit from skilled therapy to address remaining deficits in order to improve  overall QoL and return to PLOF. OBJECTIVE IMPAIRMENTS decreased ROM, decreased strength, impaired UE functional use, and pain.    ACTIVITY LIMITATIONS carrying, lifting, dressing, self feeding, reach over head, and hygiene/grooming   PARTICIPATION LIMITATIONS: meal prep, cleaning, shopping, occupation, and yard work   PERSONAL FACTORS 3+ comorbidities: T2DM, PTSD, Sleep apnea  are also affecting patient's functional outcome.    REHAB POTENTIAL: Good   CLINICAL DECISION MAKING: Stable/uncomplicated   EVALUATION COMPLEXITY: Low     GOALS:  Goals reviewed with patient? No   SHORT TERM GOALS: Target date: 05/28/2022    Pt will be independent with HEP in order to improve strength and balance in  order to decrease fall risk and improve function at home and work. Baseline: Performing independently  Goal status: Ongoing    2.  Patient will demonstrate understanding of precautions listed in shoulder bicep tenodesis protocol to improve post surgical outcomes.  Baseline: Able to verbalize precautions for the phase of first four weeks  Goal status: ACHIEVED      LONG TERM GOALS: Target date: 07/23/2022     Patient will have improved function and activity level as evidenced by an increase in FOTO score by 10 points or more.  Baseline: 30/100 target of 52 07/09/22: Target  74 Goal status: ACHIEVED    2.  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  Patient will regain right shoulder strength that is symmetrical to left shoulder to return to lifting activities to return to gym and repairing cars.  Baseline: Unable to test RUE    06/29/22:Shoulder Flex R/L 5/5, Shoulder Abd R/L 5/5,  Shoulder Ext R/L NT, Shoulder ER R/L 5/5, Shoulder IR R/L 5/5   07/23/22: Shoulder Flex R/L 5/5, Shoulder Abd R/L 5/5,  Shoulder Ext R/L 5/5 , Shoulder ER R/L 5/5, Shoulder IR R/L 5/5  Goal status: Achieved    4.  Patient will return to weight lifting activities with ability to perform overhead resisted activities with right shoulder to return to gym and reengage with fitness activities.   Baseline: Has not returned to [facility] but able to complete machines within gym  Goal status: Ongoing      PLAN: PT FREQUENCY: 1-2x/week   PT DURATION: 10 weeks   PLANNED INTERVENTIONS: Therapeutic exercises, Therapeutic activity, Neuromuscular re-education,  Patient/Family education, Self Care, Joint mobilization, Joint manipulation, Dry Needling, Spinal manipulation, Spinal mobilization, Cryotherapy, Moist heat, scar mobilization, Manual therapy, and Re-evaluation  PLAN FOR NEXT SESSION:  Reassess goals. See biceps tenodesis protocol on desk for protocol for weeks 8-12. Continue with progressive loading of shoulder and parascapular strengthening exercises.    Bradly Chris PT, DPT  07/23/22, 10:19 AM

## 2022-07-27 ENCOUNTER — Encounter: Payer: Self-pay | Admitting: Physical Therapy

## 2022-07-27 ENCOUNTER — Ambulatory Visit: Payer: 59 | Admitting: Physical Therapy

## 2022-07-27 DIAGNOSIS — M25511 Pain in right shoulder: Secondary | ICD-10-CM | POA: Diagnosis not present

## 2022-07-27 DIAGNOSIS — M67813 Other specified disorders of tendon, right shoulder: Secondary | ICD-10-CM

## 2022-07-27 DIAGNOSIS — G8929 Other chronic pain: Secondary | ICD-10-CM

## 2022-07-27 NOTE — Addendum Note (Signed)
Addended by: Daneil Dan on: 07/27/2022 01:15 PM   Modules accepted: Orders

## 2022-07-27 NOTE — Therapy (Addendum)
OUTPATIENT PHYSICAL THERAPY DISCHARGE NOTE   Patient Name: Todd [name] MRN: [id number] DOB:1966-04-24, 57 y.o., male Today's Date: 07/27/2022  PCP: Dr. Celedonio [name]  REFERRING PROVIDER: Dr. Stormy [name]   END OF SESSION:   PT End of Session - 07/27/22 0850     Visit Number 11    Number of Visits 20    Date for PT Re-Evaluation 08/24/22    Authorization Type [facility] Medicare/Medicaid    Authorization Time Period 07/24/22-/08/24/22    Authorization - Visit Number 11    Authorization - Number of Visits 20    Progress Note Due on Visit 20    PT Start Time 0845    PT Stop Time 0930    PT Time Calculation (min) 45 min    Activity Tolerance Patient tolerated treatment well    Behavior During Therapy [facility] for tasks assessed/performed               Past Medical History:  Diagnosis Date   Anxiety    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   Chronic left shoulder pain 02/15/2016   Depression    Diabetes mellitus without complication (HCC)    Dislocation of right thumb 06/05/2016   Diverticulitis    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  Migraines 2-3x/yr.   HTN (hypertension) 05/19/2016   Hypertension    MDD (major depressive disorder), recurrent episode (HCC) 05/28/2016   Neck pain, bilateral 02/15/2016   Primary osteoarthritis of right knee 01/31/2016   and fingers   PTSD (post-traumatic stress disorder) 06/05/2016   Severe recurrent major depression without psychotic features (HCC) 05/19/2016   Sleep apnea    unable to tolerate CPAP   Substance induced mood disorder (HCC) 05/19/2016   Suicidal ideation 05/19/2016   Type 2 diabetes mellitus with complication, without long-term current use of insulin (HCC) 06/08/2017   Uncontrolled type 2 diabetes mellitus with hyperglycemia, without long-term current use of insulin (HCC) 03/12/2017   Vertigo    Past Surgical  History:  Procedure Laterality Date   BACK SURGERY  11/209/18 and 10/08/17   x 2   CIRCUMCISION N/A 07/25/2020   Procedure: CIRCUMCISION ADULT;  Surgeon: Todd Scotland, MD;  Location: [facility] ORS;  Service: Urology;  Laterality: N/A;   COLONOSCOPY WITH PROPOFOL N/A 01/31/2018   Procedure: COLONOSCOPY WITH PROPOFOL;  Surgeon: Todd Jun, MD;  Location: [facility];  Service: Endoscopy;  Laterality: N/A;   ESOPHAGOGASTRODUODENOSCOPY (EGD) WITH PROPOFOL N/A 01/31/2018   Procedure: ESOPHAGOGASTRODUODENOSCOPY (EGD) WITH PROPOFOL;  Surgeon: Todd Jun, MD;  Location: [facility] ENDOSCOPY;  Service: Endoscopy;  Laterality: N/A;   LEG AMPUTATION BELOW KNEE     left   NASAL SEPTOPLASTY W/ TURBINOPLASTY Bilateral 04/17/2020   Procedure: NASAL SEPTOPLASTY WITH INFERIORTURBINATE REDUCTION;  Surgeon: Todd Face, MD;  [facility] [address] [facility];  Service: ENT;  Laterality: Bilateral;  Diabetic - oral meds   SHOULDER SURGERY Left    Patient Active Problem List   Diagnosis Date Noted   Obesity (BMI 35.0-39.9 without comorbidity) 12/14/2019   Metatarsalgia of right foot 10/30/2019   Acquired absence of extremity 10/30/2019   Closed fracture of distal end of radius 05/31/2018   Radiculopathy of lumbosacral region 09/30/2017   Spondylolisthesis at L5-S1 level 09/30/2017   Morbid obesity (HCC) 09/06/2017   Hepatic steatosis 08/26/2017   Non-intractable vomiting with nausea 08/26/2017   Upper  abdominal pain 08/26/2017   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   Gastroparesis 06/08/2017   History of noncompliance with medical treatment 06/08/2017   Diabetes mellitus type 2 in obese ([place]) 06/08/2017   Uncontrolled type 2 diabetes mellitus with hyperglycemia ([place]) 03/12/2017   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   Dislocation of right thumb 06/05/2016   Anxiety disorder 06/05/2016   MDD (major depressive disorder), recurrent episode ([place]) 05/28/2016   Substance induced mood disorder ([place])  05/19/2016   Severe recurrent major depression without psychotic features ([place]) 05/19/2016   Suicidal ideation 05/19/2016   Essential (primary) hypertension 05/19/2016   GERD (gastroesophageal reflux disease) 05/19/2016   Alcohol use disorder, mild, abuse 05/19/2016   Chronic left shoulder pain 02/15/2016   Neck pain, bilateral 02/15/2016   Primary osteoarthritis of right knee 01/31/2016    REFERRING DIAG: S/p right biceps tenodesis    THERAPY DIAG:  Chronic right shoulder pain  Biceps tendonosis of right shoulder  Rationale for Evaluation and Treatment Rehabilitation  PERTINENT HISTORY: Per Dr. Shelbie [name] note on 05/11/22:     Todd [name] is a 56 y.o. male status post right biceps tenodesis revision on 05/06/2022.   PLAN: Provided patient with physical therapy referral and post-op rehab protocol. Advised to continue sling use per protocol. Discussed gentle elbow range of motion to prevent stiffness. All questions were answered and he is in agreement with plan.  - Follow-up plan: 4 weeks - X-rays/plan at next visit: none.   PRECAUTIONS: No lifting with affected UE, no loading biceps   SUBJECTIVE: Pt reports increased muscle soreness in right shoulder from doing exercises especially in upper trap and pec.    PAIN:  Are you having pain? No   OBJECTIVE: (objective measures completed at initial evaluation unless otherwise dated)  VITALS: BP 138/80 HR 77 SpO2 98   DIAGNOSTIC FINDINGS:      EXAM: XR SHOULDER 3 OR MORE VIEWS RIGHT  DATE: 03/13/2022 10:49 AM  ACCESSION: [id number]  DICTATED: 03/13/2022 11:03 AM  INTERPRETATION LOCATION: [place]   CLINICAL INDICATION: 56 years old Male with evaluate shoulder pain/previous repair  - M25.511 - Acute pain of right shoulder        COMPARISON: MRI dated 05/12/2021   TECHNIQUE: AP, Grashey, outlet, and axillary views of the right shoulder.   FINDINGS:  No acute fractures. No malalignment. Sequelae of distal clavicle  resection. Glenohumeral joint space is preserved with small glenoid and inferior humeral head osteophyte. Peripherally sclerotic and circumscribed lucent lesion within the proximal humerus measuring approximately 6.2 x 2.3 cm without endosteal scalloping, cortical breakthrough or periosteal reaction. Irregularity along the greater tuberosity with small adjacent osseous bodies compatible with rotator cuff tendinopathy. Partially imaged lungs are clear.    ///////////////////////////////////////////////////////////////////////////////////////////////////////////////////////////////////////////   Christianne Dolin, MD - 03/13/2022  Formatting of this note might be different from the original.  EXAM: XR SHOULDER 3 OR MORE VIEWS RIGHT  DATE: 03/13/2022 10:49 AM  ACCESSION: [id number]  DICTATED: 03/13/2022 11:03 AM  INTERPRETATION LOCATION: [place]   CLINICAL INDICATION: 56 years old Male with evaluate shoulder pain/previous repair  - M25.511 - Acute pain of right shoulder     COMPARISON: MRI dated 05/12/2021   TECHNIQUE: AP, Grashey, outlet, and axillary views of the right shoulder.   FINDINGS:  No acute fractures. No malalignment. Sequelae of distal clavicle resection. Glenohumeral joint space is preserved with small glenoid and inferior humeral head osteophyte. Peripherally sclerotic and circumscribed lucent lesion within the proximal humerus measuring approximately 6.2 x 2.3 cm  without endosteal scalloping, cortical breakthrough or periosteal reaction. Irregularity along the greater tuberosity with small adjacent osseous bodies compatible with rotator cuff tendinopathy. Partially imaged lungs are clear.   IMPRESSION:  No acute fracture or dislocation.   Redemonstrated peripherally sclerotic proximal humerus lesion compatible with a nonaggressive osseous lesion. This is better characterized on prior MRI dated 05/12/2021   PATIENT SURVEYS:  FOTO 74/100   COGNITION: Overall cognitive  status: Within functional limits for tasks assessed                                  SENSATION: WFL   POSTURE: Rounded shoulders    UPPER EXTREMITY ROM:       Active ROM Right eval Left eval  Shoulder flexion 0 180  Shoulder extension 0 60  Shoulder abduction 180  180  Shoulder adduction      Shoulder internal rotation 70 70  Shoulder external rotation 90 90  Elbow flexion 150 150  Elbow extension 60 60  Wrist flexion 80 80  Wrist extension 70 70  Wrist ulnar deviation 30 30  Wrist radial deviation 20 20  Wrist pronation 80 80  Wrist supination 80 80  (Blank rows = not tested)          UPPER EXTREMITY MMT:   MMT Right eval Left eval 06/29/22 Right  07/23/22 Right   Shoulder flexion NT 5 5 5   Shoulder extension NT  5 5 5   Shoulder abduction NT  5 5 5   Shoulder adduction        Shoulder internal rotation     5 5  Shoulder external rotation     5 5  Middle trapezius   NT  5  Lower trapezius   NT   4+  Elbow flexion   5  5  Elbow extension   5    Wrist flexion   4+*    Wrist extension   4+*    Wrist ulnar deviation        Wrist radial deviation        Wrist pronation        Wrist supination        Grip strength (lbs)   Fair*    (Blank rows = not tested)   Patient had recent carpal tunnel surgery on both wris   SHOULDER SPECIAL TESTS:  Not applicable because s/p right biceps tendesis 05/06/22   JOINT MOBILITY TESTING:  N/a   PALPATION:  Anterior surface of right shoulder              TODAY'S TREATMENT:   07/27/22: BE seat at 9 for 5 min at level 3 resistance with RUE only  Upper Trap Stretch 3 x 30 sec  Pec Stretch Upper, Middle, Lower 6 x 30 sec Overhead Triceps Stretch 3 x 30 sec  Biceps Stretch 3 x 30 sec   07/23/22:  UBE seat at 9 for 5 min at level 3 resistance with RUE only  Shoulder Flex R/L 5/5, Shoulder Abd R/L 5/5,  Shoulder Ext R/L 5/5 , Shoulder ER R/L 5/5, Shoulder IR R/L 5/5 Mid Trap R 5, Rhomboid R 5, Lower Trap R 4+, Ext R  4+ Bent over T's with #5 DB thumbs up for mid trap 3 x 10  Bent over T's with #5 DB thumbs down for rhomboid 3 x 10  Bent over W's with #5 DB 3 x 10  Supine Tricep Extension with RUE #5 3 x 10    07/16/22:  UBE seat at 9 for 5 min at level 2 resistance with RUE only  Discussion about callous caused by below the knee amputation and reason for left hip IR.  D2 PNF Pattern with RUE with Green TB 3 x 10    Right Bicep Curl to Shoulder Press #15 1 x 10  Right Wrist Flexion #5 3 x 15 Right Wrist Extension #5 3 x 15   07/14/22:  UBE seat at 9 for 5 min at level 2 resistance  Maudsley's Test on LUE: Negative  Right Biceps Curl #10 1 x 10  Right Biceps Curl #15 2 x 10  Right Resisted Supination #10 3 x 10   Right Bicep Curl to Shoulder Press #15 1 x 10  Right Wrist Flexion #5 3 x 15 Right Wrist Extension #5 3 x 15   07/09/22: UBE seat at 8 for 6 min   OMEGA Seated Rows #95 3 x 8  OMEGA Lat Pull Down #35 1 x 10  OMEGA Lat Pull Down #45 1 x 10  OMEGA Lat Pull Down #55 1 x 10   Wall Slides with pillow case 2 x 10   Bent over tricep extensions on RUE  1 x 10   PATIENT EDUCATION: Education details: Form and technique for appropriate exercise.  Person educated: Patient Education method: Explanation, Demonstration, Verbal cues, and Handouts Education comprehension: verbalized understanding, returned demonstration, and verbal cues required   Educated patient on rehab protocol and precautions for post-op phase : [email or web address]     HOME EXERCISE PROGRAM: Access Code: [email or web address] URL: [email or web address] Date: 07/27/2022 Prepared by: Ellin Goodieaniel Joury Allcorn  Exercises - Seated Upper Trapezius Stretch  - 1 x daily - 3 reps - 30-60 sec hold - Standing Bicep Stretch at Wall  - 1 x daily - 3 reps - 30-60 sec hold - Doorway Pec Stretch at 90 Degrees Abduction  -  1 x daily - 3 reps - 30-60 sec  hold - Standing Overhead Triceps Stretch  - 1 x daily - 3 reps - 30-60 sec  hold - Sleeper Stretch  - 7 x weekly - 3 reps - 30 hold - Single Arm Shoulder Flexion with Dumbbell  - 1 x daily - 3 x weekly - 1 sets - 30 reps - Shoulder Abduction with Dumbbells - Thumbs Up  - 1 x daily - 3 x weekly - 1 sets - 30 reps - Bicep Curl to Shoulder Press with Dumbbells  - 1 x daily - 3 x weekly - 30 reps - Sidelying Shoulder ER with Towel and Dumbbell  - 1 x daily - 3 x weekly - 3 sets - 10 reps - Shoulder Overhead Press in Flexion with Dumbbells  - 1 x daily - 3 x weekly - 3 sets - 10 reps - Scapular Wall Slides  - 1 x daily - 3 x weekly - 3 sets - 10 reps - Standing Bent Over on Chair Single Arm Tricep Extension with Dumbbell  - 1 x daily - 3 x weekly - 3 sets - 10 reps - Wrist Flexion with Dumbbell  - 1 x daily - 3 x weekly - 3 sets - 15 reps - Seated Wrist Extension with Dumbbell  - 1 x daily - 3 x weekly - 3 sets - 15 reps - Prone Shoulder W on Swiss Ball  - 3 x weekly - 3 sets - 10 reps - Single Arm  Bent Over Shoulder Horizontal Abduction with Dumbbell - Palm Down  - 3 x weekly - 3 sets - 10 reps  ASSESSMENT:   CLINICAL IMPRESSION:  Pt presents s/p 8 weeks right bicep tendinosis. His right shoulder ROM and strength is symmetrical to left shoulder and he has returned to lifting weights independently. Pt can pursue HEP independently and he is ready for discharge.   OBJECTIVE IMPAIRMENTS decreased ROM, decreased strength, impaired UE functional use, and pain.    ACTIVITY LIMITATIONS carrying, lifting, dressing, self feeding, reach over head, and hygiene/grooming   PARTICIPATION LIMITATIONS: meal prep, cleaning, shopping, occupation, and yard work   PERSONAL FACTORS 3+ comorbidities: T2DM, PTSD, Sleep apnea  are also affecting patient's functional outcome.    REHAB POTENTIAL: Good   CLINICAL DECISION MAKING: Stable/uncomplicated   EVALUATION COMPLEXITY: Low      GOALS:  Goals reviewed with patient? No   SHORT TERM GOALS: Target date: 05/28/2022    Pt will be independent with HEP in order to improve strength and balance in order to decrease fall risk and improve function at home and work. Baseline: Performing independently  Goal status: Ongoing    2.  Patient will demonstrate understanding of precautions listed in shoulder bicep tenodesis protocol to improve post surgical outcomes.  Baseline: Able to verbalize precautions for the phase of first four weeks  Goal status: ACHIEVED      LONG TERM GOALS: Target date: 07/23/2022     Patient will have improved function and activity level as evidenced by an increase in FOTO score by 10 points or more.  Baseline: 30/100 target of 52 07/09/22: Target  74 Goal status: ACHIEVED    2.  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  Patient will regain right shoulder strength that is symmetrical to left shoulder to return to lifting activities to return to gym and repairing cars.  Baseline: Unable to test RUE    06/29/22:Shoulder Flex R/L 5/5, Shoulder Abd R/L 5/5,  Shoulder Ext R/L NT, Shoulder ER R/L 5/5, Shoulder IR R/L 5/5   07/23/22: Shoulder Flex R/L 5/5, Shoulder Abd R/L 5/5,  Shoulder Ext R/L 5/5 , Shoulder ER R/L 5/5, Shoulder IR R/L 5/5  Goal status: Achieved    4.  Patient will return to weight lifting activities with ability to perform overhead resisted activities with right shoulder to return to gym and reengage with fitness activities.   Baseline: Pt returned to lifting weights  Goal status: Achieved      PLAN: PT FREQUENCY:  1-2x/week   PT DURATION: 10 weeks   PLANNED INTERVENTIONS: Therapeutic exercises, Therapeutic activity, Neuromuscular re-education, Patient/Family education, Self Care, Joint mobilization, Joint manipulation, Dry Needling, Spinal manipulation, Spinal mobilization, Cryotherapy, Moist heat, scar mobilization, Manual therapy, and Re-evaluation  PLAN FOR NEXT SESSION:  Discharge from PT  Ellin Goodie PT, DPT  07/27/22, 1:18 PM

## 2022-07-29 ENCOUNTER — Encounter: Payer: 59 | Admitting: Physical Therapy

## 2022-08-03 ENCOUNTER — Encounter: Payer: 59 | Admitting: Physical Therapy

## 2022-08-10 ENCOUNTER — Encounter (INDEPENDENT_AMBULATORY_CARE_PROVIDER_SITE_OTHER): Payer: Self-pay

## 2022-08-12 ENCOUNTER — Encounter: Payer: 59 | Admitting: Physical Therapy

## 2022-08-18 ENCOUNTER — Encounter: Payer: 59 | Admitting: Physical Therapy

## 2022-08-25 ENCOUNTER — Encounter: Payer: 59 | Admitting: Physical Therapy

## 2022-08-27 ENCOUNTER — Encounter: Payer: 59 | Admitting: Physical Therapy

## 2022-09-10 ENCOUNTER — Ambulatory Visit: Payer: Self-pay | Admitting: General Surgery

## 2022-09-10 NOTE — H&P (Signed)
PATIENT PROFILE: Todd [name] is a 56 y.o. male who presents to the Clinic for consultation at the request of Dr. Haig Prophet for evaluation of cholelithiasis.  PCP:  Karl Luke, Althia Forts, MD  HISTORY OF PRESENT ILLNESS: [name] reports has been having upper abdominal pain for the last few months.  He endorses that the pain is in the epigastric area and radiates to the right upper quadrant.  Pain is exacerbated by oral intake.  Denies any alleviating factors.  He had ultrasound of the abdomen that showed gallbladder sludge with pericholecystic fluid.  I personally evaluated the images.  He had a previous CMP that shows a bilirubin elevated to 1.7.  Normal alkaline phosphatase.  I repeated hepatic function panel today and the bilirubin and alkaline phosphatase are within normal limits.  Minimal elevation of AST.  Patient has been evaluated by gastroenterology.  Recommendation was evaluation for cholecystectomy.  He is also having workup for IBS.  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) 05/22/2021   Encounter for orthopedic aftercare following surgical amputation 05/22/2021   Hx of right BKA (CMS-HCC) 02/18/2021   Obesity (BMI 35.0-39.9 without comorbidity), unspecified 12/14/2019   Acquired absence of extremity 10/30/2019   Metatarsalgia of right foot 10/30/2019   Uncontrolled type 2 diabetes mellitus with hyperglycemia (CMS-HCC) 08/27/2018   Diabetes mellitus type 2 in obese  08/27/2018   Essential (primary) hypertension 08/27/2018   Anxiety disorder 08/27/2018   Morbid obesity (CMS-HCC) 08/27/2018   Upper abdominal pain 08/26/2017   Non-intractable vomiting with nausea 08/26/2017   Gastroesophageal reflux disease without esophagitis 08/26/2017   Hepatic steatosis 08/26/2017   Uncontrolled type 2 diabetes mellitus with hyperglycemia, without long-term current use of insulin (CMS-HCC)  03/12/2017   Neck pain, bilateral 02/15/2016   Chronic left shoulder pain 02/15/2016   Primary osteoarthritis of right knee 01/31/2016    GENERAL REVIEW OF SYSTEMS:   General ROS: negative for - chills, fatigue, fever, weight gain or weight loss Allergy and Immunology ROS: negative for - hives  Hematological and Lymphatic ROS: negative for - bleeding problems or bruising, negative for palpable nodes Endocrine ROS: negative for - heat or cold intolerance, hair changes Respiratory ROS: negative for - cough, shortness of breath or wheezing Cardiovascular ROS: no chest pain or palpitations GI ROS: negative for nausea, vomiting, positive for abdominal pain, diarrhea, constipation Musculoskeletal ROS: negative for - joint swelling or muscle pain Neurological ROS: negative for - confusion, syncope Dermatological ROS: negative for pruritus and rash Psychiatric: negative for anxiety, depression, difficulty sleeping and memory loss  MEDICATIONS: Current Outpatient Medications  Medication Sig Dispense Refill   acetaminophen (TYLENOL) 500 MG tablet Non-Aspirin Extra Strength 500 mg tablet  take 2 tablets by mouth every 8 hours for 7 days     atorvastatin (LIPITOR) 40 MG tablet Take 1 tablet (40 mg total) by mouth once daily 90 tablet 3   blood glucose diagnostic test strip Use 3 (three) times daily Use three times daily 300 each 1   blood glucose meter kit as directed 1 each 0   blood-gluc meter-wrist BP mntr Kit      cloNIDine HCl (CATAPRES) 0.1 MG tablet Take 0.2 mg by mouth once daily       clotrimazole-betamethasone (LOTRISONE) 1-0.05 % cream Apply topically 2 (two) times daily 30 g 3   dapagliflozin (FARXIGA) 10 mg tablet Take 1  tablet (10 mg total) by mouth every morning 90 tablet 3   hydroCHLOROthiazide (HYDRODIURIL) 25 MG tablet take 1 tablet daily     JANUVIA 100 mg tablet Take 1 tablet (100 mg total) by mouth once daily 90 tablet 3   lisinopril (PRINIVIL,ZESTRIL) 40 MG tablet take 1 tablet  daily     omeprazole (PRILOSEC) 40 MG DR capsule TAKE 1 CAPSULE(40 MG) BY MOUTH TWICE DAILY 30 MINUTES BEFORE BREAKFAST AND DINNER 180 capsule 1   ondansetron (ZOFRAN-ODT) 4 MG disintegrating tablet Take by mouth     oxybutynin (DITROPAN) 5 mg tablet Take 15 mg by mouth once daily     sodium, potassium, and magnesium (SUPREP) oral solution Take 1 Bottle by mouth as directed One kit contains 2 bottles.  Take both bottles at the times instructed by your provider. 354 mL 0   amLODIPine (NORVASC) 5 MG tablet Take 1 tablet (5 mg total) by mouth once daily 90 tablet 3   lidocaine 5 % Gel Apply 1 Application topically once daily as needed To rectum (Patient not taking: Reported on 09/02/2022) 30 g 0   No current facility-administered medications for this visit.    ALLERGIES: Mirabegron, Prednisone, Diphenhydramine hcl, Fluticasone, and Tamsulosin  PAST MEDICAL HISTORY: Past Medical History:  Diagnosis Date   Anxiety and depression    Fatty liver disease, nonalcoholic    GERD (gastroesophageal reflux disease)    History of diverticulitis 07/17/2017   Acute transverse diverticulitis   Hypertension    Osteoarthritis    Sleep apnea    Type 2 diabetes mellitus (CMS-HCC)     PAST SURGICAL HISTORY: Past Surgical History:  Procedure Laterality Date   COLONOSCOPY  01/31/2018   Hyperplastic Polyps: CBF 01/2023   EGD  01/31/2018   Gastritis, Esophagitis: No repeat per RTE   Rt arthroscopic regeneten patch application(rotator cuff repair) biceps tenodesis, extensive debridement of shoulder(glenohumeral & subacromial spaces) distal clavicle excision, subacromial decompression Right 06/03/2021   Dr. Posey Pronto   BELOW KNEE AMPUTATION Left    in childhood after trauma   [place]COPY  ~2014 ([place])   Colon polyps in past per pt.   Shoulder Surgery     Torn Meniscal Right      FAMILY HISTORY: Family History  Problem Relation Age of Onset   Diabetes Mother    Heart disease Mother    Myocardial  Infarction (Heart attack) Mother    High blood pressure (Hypertension) Mother    No Known Problems Father    Lung cancer Maternal Uncle    Stomach cancer Maternal Aunt      SOCIAL HISTORY: Social History   Socioeconomic History   Marital status: Married  Tobacco Use   Smoking status: Former    Types: Cigarettes, Cigars    Quit date: 10/12/1984    Years since quitting: 37.9   Smokeless tobacco: Never  Vaping Use   Vaping Use: Never used  Substance and Sexual Activity   Alcohol use: No   Drug use: No   Sexual activity: Yes    Partners: Female    PHYSICAL EXAM: Vitals:   09/10/22 1116  BP: (!) 165/89  Pulse: 81   Body mass index is 38.62 kg/m. Weight: (!) 115.2 kg (254 lb)   GENERAL: Alert, active, oriented x3  HEENT: Pupils equal reactive to light. Extraocular movements are intact. Sclera clear. Palpebral conjunctiva normal red color.Pharynx clear.  NECK: Supple with no palpable mass and no adenopathy.  LUNGS: Sound clear with no rales rhonchi  or wheezes.  HEART: Regular rhythm S1 and S2 without murmur.  ABDOMEN: Soft and depressible, nontender with no palpable mass, no hepatomegaly.   EXTREMITIES: Well-developed well-nourished symmetrical with no dependent edema.  NEUROLOGICAL: Awake alert oriented, facial expression symmetrical, moving all extremities.  REVIEW OF DATA: I have reviewed the following data today: Office Visit on 09/10/2022  Component Date Value   Protein, Total 09/10/2022 7.3    Albumin 09/10/2022 4.5    Bilirubin, Total 09/10/2022 0.6    Bilirubin, Conjugated 09/10/2022 0.14    Alk Phos (alkaline Phosp* 09/10/2022 72    AST  09/10/2022 78 (H)    ALT  09/10/2022 47      ASSESSMENT: Mr. Berthelot is a 56 y.o. male presenting for consultation for cholelithiasis.    Patient was oriented about the diagnosis of cholelithiasis. Also oriented about what is the gallbladder, its anatomy and function and the implications of having stones (large).  The patient was oriented about the treatment alternatives (observation vs cholecystectomy). Patient was oriented that a low percentage of patient will continue to have similar pain symptoms even after the gallbladder is removed. Surgical technique (open vs laparoscopic) was discussed. It was also discussed the goals of the surgery (decrease the pain episodes and avoid the risk of cholecystitis) and the risk of surgery including: bleeding, infection, common bile duct injury, stone retention, injury to other organs such as bowel, liver, stomach, other complications such as hernia, bowel obstruction among others. Also discussed with patient about anesthesia and its complications such as: reaction to medications, pneumonia, heart complications, death, among others.   Since bilirubin now within normal limits I recommend patient to proceed with cholecystectomy.  I think that cholecystectomy will improve some of his abdominal pain such as the upper abdominal pain that exacerbated by eating.  He still need to continue workup for his IBS.  Cholelithiasis without cholecystitis [K80.20]  PLAN: Proceed with robotic assisted laparoscopic cholecystectomy  Patient verbalized understanding, all questions were answered, and were agreeable with the plan outlined above.     Herbert Pun, MD  Electronically signed by Herbert Pun, MD

## 2022-09-10 NOTE — H&P (View-Only) (Signed)
PATIENT PROFILE: Stepehn [name] is a 56 y.o. male who presents to the Clinic for consultation at the request of Dr. Locklear for evaluation of cholelithiasis.  PCP:  Manchero Revelo, Adrian Alexander, MD  HISTORY OF PRESENT ILLNESS: Mr. Pons reports has been having upper abdominal pain for the last few months.  He endorses that the pain is in the epigastric area and radiates to the right upper quadrant.  Pain is exacerbated by oral intake.  Denies any alleviating factors.  He had ultrasound of the abdomen that showed gallbladder sludge with pericholecystic fluid.  I personally evaluated the images.  He had a previous CMP that shows a bilirubin elevated to 1.7.  Normal alkaline phosphatase.  I repeated hepatic function panel today and the bilirubin and alkaline phosphatase are within normal limits.  Minimal elevation of AST.  Patient has been evaluated by gastroenterology.  Recommendation was evaluation for cholecystectomy.  He is also having workup for IBS.  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) 05/22/2021   Encounter for orthopedic aftercare following surgical amputation 05/22/2021   Hx of right BKA (CMS-HCC) 02/18/2021   Obesity (BMI 35.0-39.9 without comorbidity), unspecified 12/14/2019   Acquired absence of extremity 10/30/2019   Metatarsalgia of right foot 10/30/2019   Uncontrolled type 2 diabetes mellitus with hyperglycemia (CMS-HCC) 08/27/2018   Diabetes mellitus type 2 in obese  08/27/2018   Essential (primary) hypertension 08/27/2018   Anxiety disorder 08/27/2018   Morbid obesity (CMS-HCC) 08/27/2018   Upper abdominal pain 08/26/2017   Non-intractable vomiting with nausea 08/26/2017   Gastroesophageal reflux disease without esophagitis 08/26/2017   Hepatic steatosis 08/26/2017   Uncontrolled type 2 diabetes mellitus with hyperglycemia, without long-term current use of insulin (CMS-HCC)  03/12/2017   Neck pain, bilateral 02/15/2016   Chronic left shoulder pain 02/15/2016   Primary osteoarthritis of right knee 01/31/2016    GENERAL REVIEW OF SYSTEMS:   General ROS: negative for - chills, fatigue, fever, weight gain or weight loss Allergy and Immunology ROS: negative for - hives  Hematological and Lymphatic ROS: negative for - bleeding problems or bruising, negative for palpable nodes Endocrine ROS: negative for - heat or cold intolerance, hair changes Respiratory ROS: negative for - cough, shortness of breath or wheezing Cardiovascular ROS: no chest pain or palpitations GI ROS: negative for nausea, vomiting, positive for abdominal pain, diarrhea, constipation Musculoskeletal ROS: negative for - joint swelling or muscle pain Neurological ROS: negative for - confusion, syncope Dermatological ROS: negative for pruritus and rash Psychiatric: negative for anxiety, depression, difficulty sleeping and memory loss  MEDICATIONS: Current Outpatient Medications  Medication Sig Dispense Refill   acetaminophen (TYLENOL) 500 MG tablet Non-Aspirin Extra Strength 500 mg tablet  take 2 tablets by mouth every 8 hours for 7 days     atorvastatin (LIPITOR) 40 MG tablet Take 1 tablet (40 mg total) by mouth once daily 90 tablet 3   blood glucose diagnostic test strip Use 3 (three) times daily Use three times daily 300 each 1   blood glucose meter kit as directed 1 each 0   blood-gluc meter-wrist BP mntr Kit      cloNIDine HCl (CATAPRES) 0.1 MG tablet Take 0.2 mg by mouth once daily       clotrimazole-betamethasone (LOTRISONE) 1-0.05 % cream Apply topically 2 (two) times daily 30 g 3   dapagliflozin (FARXIGA) 10 mg tablet Take 1   tablet (10 mg total) by mouth every morning 90 tablet 3   hydroCHLOROthiazide (HYDRODIURIL) 25 MG tablet take 1 tablet daily     JANUVIA 100 mg tablet Take 1 tablet (100 mg total) by mouth once daily 90 tablet 3   lisinopril (PRINIVIL,ZESTRIL) 40 MG tablet take 1 tablet  daily     omeprazole (PRILOSEC) 40 MG DR capsule TAKE 1 CAPSULE(40 MG) BY MOUTH TWICE DAILY 30 MINUTES BEFORE BREAKFAST AND DINNER 180 capsule 1   ondansetron (ZOFRAN-ODT) 4 MG disintegrating tablet Take by mouth     oxybutynin (DITROPAN) 5 mg tablet Take 15 mg by mouth once daily     sodium, potassium, and magnesium (SUPREP) oral solution Take 1 Bottle by mouth as directed One kit contains 2 bottles.  Take both bottles at the times instructed by your provider. 354 mL 0   amLODIPine (NORVASC) 5 MG tablet Take 1 tablet (5 mg total) by mouth once daily 90 tablet 3   lidocaine 5 % Gel Apply 1 Application topically once daily as needed To rectum (Patient not taking: Reported on 09/02/2022) 30 g 0   No current facility-administered medications for this visit.    ALLERGIES: Mirabegron, Prednisone, Diphenhydramine hcl, Fluticasone, and Tamsulosin  PAST MEDICAL HISTORY: Past Medical History:  Diagnosis Date   Anxiety and depression    Fatty liver disease, nonalcoholic    GERD (gastroesophageal reflux disease)    History of diverticulitis 07/17/2017   Acute transverse diverticulitis   Hypertension    Osteoarthritis    Sleep apnea    Type 2 diabetes mellitus (CMS-HCC)     PAST SURGICAL HISTORY: Past Surgical History:  Procedure Laterality Date   COLONOSCOPY  01/31/2018   Hyperplastic Polyps: CBF 01/2023   EGD  01/31/2018   Gastritis, Esophagitis: No repeat per RTE   Rt arthroscopic regeneten patch application(rotator cuff repair) biceps tenodesis, extensive debridement of shoulder(glenohumeral & subacromial spaces) distal clavicle excision, subacromial decompression Right 06/03/2021   Dr. Patel   BELOW KNEE AMPUTATION Left    in childhood after trauma   COLONOSCOPY  ~2014 ([place])   Colon polyps in past per pt.   Shoulder Surgery     Torn Meniscal Right      FAMILY HISTORY: Family History  Problem Relation Age of Onset   Diabetes Mother    Heart disease Mother    Myocardial  Infarction (Heart attack) Mother    High blood pressure (Hypertension) Mother    No Known Problems Father    Lung cancer Maternal Uncle    Stomach cancer Maternal Aunt      SOCIAL HISTORY: Social History   Socioeconomic History   Marital status: Married  Tobacco Use   Smoking status: Former    Types: Cigarettes, Cigars    Quit date: 10/12/1984    Years since quitting: 37.9   Smokeless tobacco: Never  Vaping Use   Vaping Use: Never used  Substance and Sexual Activity   Alcohol use: No   Drug use: No   Sexual activity: Yes    Partners: Female    PHYSICAL EXAM: Vitals:   09/10/22 1116  BP: (!) 165/89  Pulse: 81   Body mass index is 38.62 kg/m. Weight: (!) 115.2 kg (254 lb)   GENERAL: Alert, active, oriented x3  HEENT: Pupils equal reactive to light. Extraocular movements are intact. Sclera clear. Palpebral conjunctiva normal red color.Pharynx clear.  NECK: Supple with no palpable mass and no adenopathy.  LUNGS: Sound clear with no rales rhonchi   or wheezes.  HEART: Regular rhythm S1 and S2 without murmur.  ABDOMEN: Soft and depressible, nontender with no palpable mass, no hepatomegaly.   EXTREMITIES: Well-developed well-nourished symmetrical with no dependent edema.  NEUROLOGICAL: Awake alert oriented, facial expression symmetrical, moving all extremities.  REVIEW OF DATA: I have reviewed the following data today: Office Visit on 09/10/2022  Component Date Value   Protein, Total 09/10/2022 7.3    Albumin 09/10/2022 4.5    Bilirubin, Total 09/10/2022 0.6    Bilirubin, Conjugated 09/10/2022 0.14    Alk Phos (alkaline Phosp* 09/10/2022 72    AST  09/10/2022 78 (H)    ALT  09/10/2022 47      ASSESSMENT: Mr. Cornfield is a 56 y.o. male presenting for consultation for cholelithiasis.    Patient was oriented about the diagnosis of cholelithiasis. Also oriented about what is the gallbladder, its anatomy and function and the implications of having stones (large).  The patient was oriented about the treatment alternatives (observation vs cholecystectomy). Patient was oriented that a low percentage of patient will continue to have similar pain symptoms even after the gallbladder is removed. Surgical technique (open vs laparoscopic) was discussed. It was also discussed the goals of the surgery (decrease the pain episodes and avoid the risk of cholecystitis) and the risk of surgery including: bleeding, infection, common bile duct injury, stone retention, injury to other organs such as bowel, liver, stomach, other complications such as hernia, bowel obstruction among others. Also discussed with patient about anesthesia and its complications such as: reaction to medications, pneumonia, heart complications, death, among others.   Since bilirubin now within normal limits I recommend patient to proceed with cholecystectomy.  I think that cholecystectomy will improve some of his abdominal pain such as the upper abdominal pain that exacerbated by eating.  He still need to continue workup for his IBS.  Cholelithiasis without cholecystitis [K80.20]  PLAN: Proceed with robotic assisted laparoscopic cholecystectomy  Patient verbalized understanding, all questions were answered, and were agreeable with the plan outlined above.     Brande Uncapher Cintron-Diaz, MD  Electronically signed by Zissy Hamlett Cintron-Diaz, MD  

## 2022-09-15 ENCOUNTER — Encounter
Admission: RE | Admit: 2022-09-15 | Discharge: 2022-09-15 | Disposition: A | Discharge status: Home / Self Care | Payer: 59 | Source: Ambulatory Visit | Attending: General Surgery | Admitting: General Surgery

## 2022-09-15 VITALS — Ht 68.0 in | Wt 247.0 lb

## 2022-09-15 DIAGNOSIS — I10 Essential (primary) hypertension: Secondary | ICD-10-CM

## 2022-09-15 DIAGNOSIS — E1165 Type 2 diabetes mellitus with hyperglycemia: Secondary | ICD-10-CM

## 2022-09-15 NOTE — Patient Instructions (Addendum)
Your procedure is scheduled on: Monday September 21, 2022. Report to [facility] inside [facility] 2nd floor, stop by registration desk before getting on elevator. To find out your arrival time please call [phone] between 1PM - 3PM on Friday September 18, 2022.  Remember: Instructions that are not followed completely may result in serious medical risk,  up to and including death, or upon the discretion of your surgeon and anesthesiologist your  surgery may need to be rescheduled.     _X__ 1. Do not eat food or drink fluids after midnight the night before your procedure.                 No chewing gum or hard candies.  __X__2.  On the morning of surgery brush your teeth with toothpaste and water, you                may rinse your mouth with mouthwash if you wish.  Do not swallow any toothpaste or mouthwash.     _X__ 3.  No Alcohol for 24 hours before or after surgery.   _X__ 4.  Do Not Smoke or use e-cigarettes For 24 Hours Prior to Your Surgery.                 Do not use any chewable tobacco products for at least 6 hours prior to                 Surgery.  _X__  5.  Do not use any recreational drugs (marijuana, cocaine, heroin, ecstasy, MDMA or other)                For at least one week prior to your surgery.  Combination of these drugs with anesthesia                May have life threatening results.  ____  6.  Bring all medications with you on the day of surgery if instructed.   __X__  7.  Notify your doctor if there is any change in your medical condition      (cold, fever, infections).     Do not wear jewelry, make-up, hairpins, clips or nail polish. Do not wear lotions, powders, or perfumes. You may wear deodorant. Do not shave 48 hours prior to surgery. Men may shave face and neck. Do not bring valuables to the hospital.    [facility] is not responsible for any belongings or valuables.  Contacts, dentures or bridgework may not be worn into  surgery. Leave your suitcase in the car. After surgery it may be brought to your room. For patients admitted to the hospital, discharge time is determined by your treatment team.   Patients discharged the day of surgery will not be allowed to drive home.   Make arrangements for someone to be with you for the first 24 hours of your Same Day Discharge.  __X__ Take these medicines the morning of surgery with A SIP OF WATER:    1. omeprazole (PRILOSEC) 40 MG  2.   3.   4.    5.  6.  ____ Fleet Enema (as directed)   __X__ Use CHG Soap (or wipes) as directed  ____ Use Benzoyl Peroxide Gel as instructed  ____ Use inhalers on the day of surgery  __X__ Stop dapagliflozin propanediol (FARXIGA) 10 MG 3 days prior to surgery    ____ Take 1/2 of usual insulin dose the night before surgery. No insulin the morning  of surgery.   ____ Call your PCP, cardiologist, or Pulmonologist if taking Coumadin/Plavix/aspirin and ask when to stop before your surgery.   __X__ One Week prior to surgery- Stop Anti-inflammatories such as Ibuprofen, Aleve, Advil, Motrin, meloxicam (MOBIC), diclofenac, etodolac, ketorolac, Toradol, Daypro, piroxicam, Goody's or BC powders. OK TO USE TYLENOL IF NEEDED   __X__ Stop supplements until after surgery.    ____ Bring C-Pap to the hospital.    If you have any questions regarding your pre-procedure instructions,  Please call Pre-admit Testing at [phone]     Preparing for Surgery with CHLORHEXIDINE GLUCONATE (CHG) Soap  Chlorhexidine Gluconate (CHG) Soap  o An antiseptic cleaner that kills germs and bonds with the skin to continue killing germs even after washing  o Used for showering the night before surgery and morning of surgery  Before surgery, you can play an important role by reducing the number of germs on your skin.  CHG (Chlorhexidine gluconate) soap is an antiseptic cleanser which kills germs and bonds with the skin to continue  killing germs even after washing.  Please do not use if you have an allergy to CHG or antibacterial soaps. If your skin becomes reddened/irritated stop using the CHG.  1. Shower the NIGHT BEFORE SURGERY and the MORNING OF SURGERY with CHG soap.  2. If you choose to wash your hair, wash your hair first as usual with your normal shampoo.  3. After shampooing, rinse your hair and body thoroughly to remove the shampoo.  4. Use CHG as you would any other liquid soap. You can apply CHG directly to the skin and wash gently with a scrungie or a clean washcloth.  5. Apply the CHG soap to your body only from the neck down. Do not use on open wounds or open sores. Avoid contact with your eyes, ears, mouth, and genitals (private parts). Wash face and genitals (private parts) with your normal soap.  6. Wash thoroughly, paying special attention to the area where your surgery will be performed.  7. Thoroughly rinse your body with warm water.  8. Do not shower/wash with your normal soap after using and rinsing off the CHG soap.  9. Pat yourself dry with a clean towel.  10. Wear clean pajamas to bed the night before surgery.  12. Place clean sheets on your bed the night of your first shower and do not sleep with pets.  13. Shower again with the CHG soap on the day of surgery prior to arriving at the hospital.  14. Do not apply any deodorants/lotions/powders.  15. Please wear clean clothes to the hospital.

## 2022-09-16 ENCOUNTER — Encounter
Admission: RE | Admit: 2022-09-16 | Discharge: 2022-09-16 | Disposition: A | Discharge status: Home / Self Care | Payer: 59 | Source: Ambulatory Visit | Attending: General Surgery | Admitting: General Surgery

## 2022-09-16 DIAGNOSIS — Z01818 Encounter for other preprocedural examination: Secondary | ICD-10-CM | POA: Insufficient documentation

## 2022-09-16 DIAGNOSIS — I10 Essential (primary) hypertension: Secondary | ICD-10-CM | POA: Insufficient documentation

## 2022-09-16 DIAGNOSIS — I445 Left posterior fascicular block: Secondary | ICD-10-CM | POA: Insufficient documentation

## 2022-09-16 DIAGNOSIS — E1165 Type 2 diabetes mellitus with hyperglycemia: Secondary | ICD-10-CM | POA: Insufficient documentation

## 2022-09-16 LAB — BASIC METABOLIC PANEL
Anion gap: 8 (ref 5–15)
BUN: 17 mg/dL (ref 6–20)
CO2: 29 mmol/L (ref 22–32)
Calcium: 8.8 mg/dL — ABNORMAL LOW (ref 8.9–10.3)
Chloride: 104 mmol/L (ref 98–111)
Creatinine, Ser: 1.02 mg/dL (ref 0.61–1.24)
GFR, Estimated: >60 mL/min (ref >60)
Glucose, Bld: 136 mg/dL — ABNORMAL HIGH (ref 70–99)
Potassium: 3.3 mmol/L — ABNORMAL LOW (ref 3.5–5.1)
Sodium: 141 mmol/L (ref 135–145)

## 2022-09-19 NOTE — Anesthesia Preprocedure Evaluation (Signed)
Anesthesia Evaluation  Patient identified by MRN, date of birth, ID band Patient awake    Reviewed: Allergy & Precautions, NPO status , Patient's Chart, lab work & pertinent test results  History of Anesthesia Complications Negative for: history of anesthetic complications  Airway Mallampati: III   Neck ROM: Full    Dental no notable dental hx.    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   Abdominal   Peds  Hematology negative hematology ROS (+)   Anesthesia Other Findings   Reproductive/Obstetrics                             Anesthesia Physical Anesthesia Plan  ASA: 2  Anesthesia Plan: General   Post-op Pain Management:    Induction: Intravenous  PONV Risk Score and Plan: 2 and Ondansetron, Dexamethasone and Treatment may vary due to age or medical condition  Airway Management Planned: Oral ETT  Additional Equipment:   Intra-op Plan:   Post-operative Plan: Extubation in OR  Informed Consent: I have reviewed the patients History and Physical, chart, labs and discussed the procedure including the risks, benefits and alternatives for the proposed anesthesia with the patient or authorized representative who has indicated his/her understanding and acceptance.     Dental advisory given  Plan Discussed with: CRNA  Anesthesia Plan Comments: (Patient consented for risks of anesthesia including but not limited to:  - adverse reactions to  medications - damage to eyes, teeth, lips or other oral mucosa - nerve damage due to positioning  - sore throat or hoarseness - damage to heart, brain, nerves, lungs, other parts of body or loss of life  Informed patient about role of CRNA in peri- and intra-operative care.  Patient voiced understanding.)        Anesthesia Quick Evaluation

## 2022-09-20 MED ORDER — ORAL CARE MOUTH RINSE: 15.0000 mL | Freq: Once | OROMUCOSAL | Status: AC

## 2022-09-20 MED ORDER — INDOCYANINE GREEN 25 MG IV SOLR
1.2500 mg | Freq: Once | INTRAVENOUS | Status: AC
  Administered 2022-09-21: 1.25 mg via INTRAVENOUS
  Filled 2022-09-20: qty 0.5

## 2022-09-20 MED ORDER — SODIUM CHLORIDE 0.9 % IV SOLN: INTRAVENOUS | Status: DC

## 2022-09-20 MED ORDER — CEFAZOLIN SODIUM-DEXTROSE 2-4 GM/100ML-% IV SOLN
2.0000 g | INTRAVENOUS | Status: AC
  Administered 2022-09-21: 2 g via INTRAVENOUS

## 2022-09-20 MED ORDER — CHLORHEXIDINE GLUCONATE 0.12 % MT SOLN: 15.0000 mL | Freq: Once | OROMUCOSAL | Status: AC

## 2022-09-21 ENCOUNTER — Encounter: Payer: Self-pay | Admitting: General Surgery

## 2022-09-21 ENCOUNTER — Other Ambulatory Visit: Payer: Self-pay

## 2022-09-21 ENCOUNTER — Ambulatory Visit
Admission: RE | Admit: 2022-09-21 | Discharge: 2022-09-21 | Disposition: A | Discharge status: Home / Self Care | Payer: 59 | Attending: General Surgery | Admitting: General Surgery

## 2022-09-21 ENCOUNTER — Ambulatory Visit: Payer: 59 | Admitting: Anesthesiology

## 2022-09-21 ENCOUNTER — Ambulatory Visit: Payer: 59 | Admitting: Urgent Care

## 2022-09-21 ENCOUNTER — Encounter
Admission: RE | Disposition: A | Discharge status: Home / Self Care | Payer: Self-pay | Source: Home / Self Care | Attending: General Surgery

## 2022-09-21 DIAGNOSIS — E669 Obesity, unspecified: Secondary | ICD-10-CM | POA: Insufficient documentation

## 2022-09-21 DIAGNOSIS — K219 Gastro-esophageal reflux disease without esophagitis: Secondary | ICD-10-CM | POA: Insufficient documentation

## 2022-09-21 DIAGNOSIS — Z89519 Acquired absence of unspecified leg below knee: Secondary | ICD-10-CM | POA: Insufficient documentation

## 2022-09-21 DIAGNOSIS — Z6837 Body mass index (BMI) 37.0-37.9, adult: Secondary | ICD-10-CM | POA: Diagnosis not present

## 2022-09-21 DIAGNOSIS — M1990 Unspecified osteoarthritis, unspecified site: Secondary | ICD-10-CM | POA: Insufficient documentation

## 2022-09-21 DIAGNOSIS — F32A Depression, unspecified: Secondary | ICD-10-CM | POA: Insufficient documentation

## 2022-09-21 DIAGNOSIS — Z87891 Personal history of nicotine dependence: Secondary | ICD-10-CM | POA: Insufficient documentation

## 2022-09-21 DIAGNOSIS — K811 Chronic cholecystitis: Secondary | ICD-10-CM | POA: Insufficient documentation

## 2022-09-21 DIAGNOSIS — E1165 Type 2 diabetes mellitus with hyperglycemia: Secondary | ICD-10-CM

## 2022-09-21 DIAGNOSIS — G4730 Sleep apnea, unspecified: Secondary | ICD-10-CM | POA: Insufficient documentation

## 2022-09-21 DIAGNOSIS — F419 Anxiety disorder, unspecified: Secondary | ICD-10-CM | POA: Diagnosis not present

## 2022-09-21 DIAGNOSIS — F4310 Post-traumatic stress disorder, unspecified: Secondary | ICD-10-CM | POA: Diagnosis not present

## 2022-09-21 DIAGNOSIS — E119 Type 2 diabetes mellitus without complications: Secondary | ICD-10-CM | POA: Diagnosis not present

## 2022-09-21 DIAGNOSIS — I10 Essential (primary) hypertension: Secondary | ICD-10-CM | POA: Diagnosis not present

## 2022-09-21 LAB — GLUCOSE, CAPILLARY
Glucose-Capillary: 160 mg/dL — ABNORMAL HIGH (ref 70–99)
Glucose-Capillary: 192 mg/dL — ABNORMAL HIGH (ref 70–99)

## 2022-09-21 SURGERY — CHOLECYSTECTOMY, ROBOT-ASSISTED, LAPAROSCOPIC
Anesthesia: General | Site: Abdomen

## 2022-09-21 MED ORDER — ACETAMINOPHEN 10 MG/ML IV SOLN
INTRAVENOUS | Status: DC | PRN
  Administered 2022-09-21: 1000 mg via INTRAVENOUS

## 2022-09-21 MED ORDER — OXYCODONE HCL 5 MG PO TABS
ORAL_TABLET | ORAL | Status: AC
  Filled 2022-09-21: qty 1

## 2022-09-21 MED ORDER — LIDOCAINE HCL (CARDIAC) PF 100 MG/5ML IV SOSY
PREFILLED_SYRINGE | INTRAVENOUS | Status: DC | PRN
  Administered 2022-09-21: 100 mg via INTRAVENOUS

## 2022-09-21 MED ORDER — SUCCINYLCHOLINE CHLORIDE 200 MG/10ML IV SOSY
PREFILLED_SYRINGE | INTRAVENOUS | Status: DC | PRN
  Administered 2022-09-21: 100 mg via INTRAVENOUS

## 2022-09-21 MED ORDER — FENTANYL CITRATE (PF) 100 MCG/2ML IJ SOLN
INTRAMUSCULAR | Status: AC
  Filled 2022-09-21: qty 2

## 2022-09-21 MED ORDER — ONDANSETRON HCL 4 MG/2ML IJ SOLN
INTRAMUSCULAR | Status: DC | PRN
  Administered 2022-09-21 (×2): 4 mg via INTRAVENOUS

## 2022-09-21 MED ORDER — HYDROMORPHONE HCL 1 MG/ML IJ SOLN
INTRAMUSCULAR | Status: DC | PRN
  Administered 2022-09-21: 1 mg via INTRAVENOUS

## 2022-09-21 MED ORDER — ACETAMINOPHEN 10 MG/ML IV SOLN
INTRAVENOUS | Status: AC
  Filled 2022-09-21: qty 100

## 2022-09-21 MED ORDER — OXYCODONE HCL 5 MG/5ML PO SOLN: 5.0000 mg | Freq: Once | ORAL | Status: AC | PRN

## 2022-09-21 MED ORDER — CHLORHEXIDINE GLUCONATE 0.12 % MT SOLN
OROMUCOSAL | Status: AC
  Administered 2022-09-21: 15 mL via OROMUCOSAL
  Filled 2022-09-21: qty 15

## 2022-09-21 MED ORDER — GLYCOPYRROLATE 0.2 MG/ML IJ SOLN
INTRAMUSCULAR | Status: DC | PRN
  Administered 2022-09-21: .2 mg via INTRAVENOUS

## 2022-09-21 MED ORDER — HYDROMORPHONE HCL 1 MG/ML IJ SOLN
INTRAMUSCULAR | Status: AC
  Filled 2022-09-21: qty 1

## 2022-09-21 MED ORDER — BUPIVACAINE-EPINEPHRINE 0.25% -1:200000 IJ SOLN
INTRAMUSCULAR | Status: DC | PRN
  Administered 2022-09-21: 30 mL

## 2022-09-21 MED ORDER — PROPOFOL 1000 MG/100ML IV EMUL
INTRAVENOUS | Status: AC
  Filled 2022-09-21: qty 100

## 2022-09-21 MED ORDER — FENTANYL CITRATE (PF) 100 MCG/2ML IJ SOLN
INTRAMUSCULAR | Status: DC | PRN
  Administered 2022-09-21 (×2): 50 ug via INTRAVENOUS

## 2022-09-21 MED ORDER — ROCURONIUM BROMIDE 100 MG/10ML IV SOLN
INTRAVENOUS | Status: DC | PRN
  Administered 2022-09-21: 40 mg via INTRAVENOUS
  Administered 2022-09-21: 10 mg via INTRAVENOUS
  Administered 2022-09-21: 20 mg via INTRAVENOUS

## 2022-09-21 MED ORDER — ESMOLOL HCL 100 MG/10ML IV SOLN
INTRAVENOUS | Status: DC | PRN
  Administered 2022-09-21 (×2): 20 mg via INTRAVENOUS

## 2022-09-21 MED ORDER — ONDANSETRON HCL 4 MG/2ML IJ SOLN
4.0000 mg | Freq: Once | INTRAMUSCULAR | Status: AC | PRN
  Administered 2022-09-21: 4 mg via INTRAVENOUS

## 2022-09-21 MED ORDER — MIDAZOLAM HCL 2 MG/2ML IJ SOLN
INTRAMUSCULAR | Status: DC | PRN
  Administered 2022-09-21: 2 mg via INTRAVENOUS

## 2022-09-21 MED ORDER — HYDROCODONE-ACETAMINOPHEN 5-325 MG PO TABS: 1.0000 | ORAL_TABLET | ORAL | 0 refills | Status: AC | PRN

## 2022-09-21 MED ORDER — MIDAZOLAM HCL 2 MG/2ML IJ SOLN
INTRAMUSCULAR | Status: AC
  Filled 2022-09-21: qty 2

## 2022-09-21 MED ORDER — LACTATED RINGERS IV SOLN: INTRAVENOUS | Status: DC

## 2022-09-21 MED ORDER — SUGAMMADEX SODIUM 500 MG/5ML IV SOLN
INTRAVENOUS | Status: DC | PRN
  Administered 2022-09-21: 400 mg via INTRAVENOUS

## 2022-09-21 MED ORDER — FENTANYL CITRATE (PF) 100 MCG/2ML IJ SOLN
25.0000 ug | INTRAMUSCULAR | Status: DC | PRN
  Administered 2022-09-21 (×3): 50 ug via INTRAVENOUS

## 2022-09-21 MED ORDER — SODIUM CHLORIDE 0.9 % IR SOLN
Status: DC | PRN
  Administered 2022-09-21: 1000 mL

## 2022-09-21 MED ORDER — VISTASEAL 10 ML SINGLE DOSE KIT
PACK | CUTANEOUS | Status: AC
  Filled 2022-09-21: qty 10

## 2022-09-21 MED ORDER — ACETAMINOPHEN 10 MG/ML IV SOLN: 1000.0000 mg | Freq: Once | INTRAVENOUS | Status: DC | PRN

## 2022-09-21 MED ORDER — BUPIVACAINE-EPINEPHRINE (PF) 0.25% -1:200000 IJ SOLN
INTRAMUSCULAR | Status: AC
  Filled 2022-09-21: qty 30

## 2022-09-21 MED ORDER — DEXMEDETOMIDINE HCL IN NACL 200 MCG/50ML IV SOLN
INTRAVENOUS | Status: DC | PRN
  Administered 2022-09-21: 8 ug via INTRAVENOUS
  Administered 2022-09-21: 4 ug via INTRAVENOUS

## 2022-09-21 MED ORDER — CEFAZOLIN SODIUM-DEXTROSE 2-4 GM/100ML-% IV SOLN
INTRAVENOUS | Status: AC
  Filled 2022-09-21: qty 100

## 2022-09-21 MED ORDER — ONDANSETRON HCL 4 MG/2ML IJ SOLN
INTRAMUSCULAR | Status: AC
  Filled 2022-09-21: qty 2

## 2022-09-21 MED ORDER — VISTASEAL 10 ML SINGLE DOSE KIT
PACK | CUTANEOUS | Status: DC | PRN
  Administered 2022-09-21: 10 mL via TOPICAL

## 2022-09-21 MED ORDER — PROPOFOL 10 MG/ML IV BOLUS
INTRAVENOUS | Status: DC | PRN
  Administered 2022-09-21: 200 mg via INTRAVENOUS

## 2022-09-21 MED ORDER — SEVOFLURANE IN SOLN
RESPIRATORY_TRACT | Status: AC
  Filled 2022-09-21: qty 250

## 2022-09-21 MED ORDER — OXYCODONE HCL 5 MG PO TABS
5.0000 mg | ORAL_TABLET | Freq: Once | ORAL | Status: AC | PRN
  Administered 2022-09-21: 5 mg via ORAL

## 2022-09-21 SURGICAL SUPPLY — 56 items
ADH SKN CLS APL DERMABOND .7 (GAUZE/BANDAGES/DRESSINGS) ×1
APL LAPSCP 35 DL APL RGD (MISCELLANEOUS) ×1
APPLICATOR VISTASEAL 35 (MISCELLANEOUS) IMPLANT
BAG PRESSURE INF REUSE 1000 (BAG) IMPLANT
BLADE SURG SZ11 CARB STEEL (BLADE) ×1 IMPLANT
CANNULA REDUC XI 12-8 STAPL (CANNULA) ×1
CANNULA REDUCER 12-8 DVNC XI (CANNULA) ×1 IMPLANT
CATH REDDICK CHOLANGI 4FR 50CM (CATHETERS) IMPLANT
CLIP LIGATING HEM O LOK PURPLE (MISCELLANEOUS) IMPLANT
CLIP LIGATING HEMO O LOK GREEN (MISCELLANEOUS) ×1 IMPLANT
DERMABOND ADVANCED .7 DNX12 (GAUZE/BANDAGES/DRESSINGS) ×1 IMPLANT
DRAPE ARM DVNC X/XI (DISPOSABLE) ×4 IMPLANT
DRAPE C-ARM XRAY 36X54 (DRAPES) IMPLANT
DRAPE COLUMN DVNC XI (DISPOSABLE) ×1 IMPLANT
DRAPE DA VINCI XI ARM (DISPOSABLE) ×4
DRAPE DA VINCI XI COLUMN (DISPOSABLE) ×1
ELECT REM PT RETURN 9FT ADLT (ELECTROSURGICAL) ×1
ELECTRODE REM PT RTRN 9FT ADLT (ELECTROSURGICAL) ×1 IMPLANT
GLOVE BIO SURGEON STRL SZ 6.5 (GLOVE) ×2 IMPLANT
GLOVE BIOGEL PI IND STRL 6.5 (GLOVE) ×2 IMPLANT
GOWN STRL REUS W/ TWL LRG LVL3 (GOWN DISPOSABLE) ×3 IMPLANT
GOWN STRL REUS W/TWL LRG LVL3 (GOWN DISPOSABLE) ×3
GRASPER SUT TROCAR 14GX15 (MISCELLANEOUS) ×1 IMPLANT
IRRIGATOR SUCT 8 DISP DVNC XI (IRRIGATION / IRRIGATOR) IMPLANT
IRRIGATOR SUCTION 8MM XI DISP (IRRIGATION / IRRIGATOR) ×1
IV CATH ANGIO 12GX3 LT BLUE (NEEDLE) IMPLANT
IV NS 1000ML (IV SOLUTION) ×1
IV NS 1000ML BAXH (IV SOLUTION) IMPLANT
KIT PINK PAD W/HEAD ARE REST (MISCELLANEOUS) ×1
KIT PINK PAD W/HEAD ARM REST (MISCELLANEOUS) ×1 IMPLANT
LABEL OR SOLS (LABEL) ×1 IMPLANT
MANIFOLD NEPTUNE II (INSTRUMENTS) ×1 IMPLANT
NDL INSUFFLATION 14GA 120MM (NEEDLE) ×1 IMPLANT
NEEDLE HYPO 22GX1.5 SAFETY (NEEDLE) ×1 IMPLANT
NEEDLE INSUFFLATION 14GA 120MM (NEEDLE) ×1 IMPLANT
NS IRRIG 500ML POUR BTL (IV SOLUTION) ×1 IMPLANT
OBTURATOR OPTICAL STANDARD 8MM (TROCAR) ×1
OBTURATOR OPTICAL STND 8 DVNC (TROCAR) ×1
OBTURATOR OPTICALSTD 8 DVNC (TROCAR) ×1 IMPLANT
PACK LAP CHOLECYSTECTOMY (MISCELLANEOUS) ×1 IMPLANT
SEAL CANN UNIV 5-8 DVNC XI (MISCELLANEOUS) ×3 IMPLANT
SEAL XI 5MM-8MM UNIVERSAL (MISCELLANEOUS) ×3
SET TUBE SMOKE EVAC HIGH FLOW (TUBING) ×1 IMPLANT
SOLUTION ELECTROLUBE (MISCELLANEOUS) ×1 IMPLANT
SPIKE FLUID TRANSFER (MISCELLANEOUS) ×2 IMPLANT
SPONGE T-LAP 4X18 ~~LOC~~+RFID (SPONGE) IMPLANT
STAPLER CANNULA SEAL DVNC XI (STAPLE) ×1 IMPLANT
STAPLER CANNULA SEAL XI (STAPLE) ×1
SUT MNCRL 4-0 (SUTURE) ×1
SUT MNCRL 4-0 27XMFL (SUTURE) ×1
SUT VICRYL 0 UR6 27IN ABS (SUTURE) ×1 IMPLANT
SUTURE MNCRL 4-0 27XMF (SUTURE) ×1 IMPLANT
SYS BAG RETRIEVAL 10MM (BASKET) ×1
SYSTEM BAG RETRIEVAL 10MM (BASKET) ×1 IMPLANT
TRAP FLUID SMOKE EVACUATOR (MISCELLANEOUS) ×1 IMPLANT
WATER STERILE IRR 500ML POUR (IV SOLUTION) ×1 IMPLANT

## 2022-09-21 NOTE — Interval H&P Note (Signed)
History and Physical Interval Note:  09/21/2022 7:01 AM  Todd [name]  has presented today for surgery, with the diagnosis of K80.20 Cholelithiasis w/o cholecystitis.  The various methods of treatment have been discussed with the patient and family. After consideration of risks, benefits and other options for treatment, the patient has consented to  Procedure(s): XI ROBOTIC ASSISTED LAPAROSCOPIC CHOLECYSTECTOMY (N/A) as a surgical intervention.  The patient's history has been reviewed, patient examined, no change in status, stable for surgery.  I have reviewed the patient's chart and labs.  Questions were answered to the patient's satisfaction.     Carolan Shiver

## 2022-09-21 NOTE — Discharge Instructions (Addendum)
  Diet: Resume home heart healthy regular diet.   Activity: No heavy lifting >20 pounds (children, pets, laundry, garbage) or strenuous activity until follow-up, but light activity and walking are encouraged. Do not drive or drink alcohol if taking narcotic pain medications.  Wound care: May shower with soapy water and pat dry (do not rub incisions), but no baths or submerging incision underwater until follow-up. (no swimming)   Medications: Resume all home medications. For mild to moderate pain: acetaminophen (Tylenol) ***or ibuprofen (if no kidney disease). Combining Tylenol with alcohol can substantially increase your risk of causing liver disease. Narcotic pain medications, if prescribed, can be used for severe pain, though may cause nausea, constipation, and drowsiness. Do not combine Tylenol and Norco within a 6 hour period as Norco contains Tylenol. If you do not need the narcotic pain medication, you do not need to fill the prescription.  Call office ([phone]) at any time if any questions, worsening pain, fevers/chills, bleeding, drainage from incision site, or other concerns.   AMBULATORY SURGERY  DISCHARGE INSTRUCTIONS   The drugs that you were given will stay in your system until tomorrow so for the next 24 hours you should not:  Drive an automobile Make any legal decisions Drink any alcoholic beverage   You may resume regular meals tomorrow.  Today it is better to start with liquids and gradually work up to solid foods.  You may eat anything you prefer, but it is better to start with liquids, then soup and crackers, and gradually work up to solid foods.   Please notify your doctor immediately if you have any unusual bleeding, trouble breathing, redness and pain at the surgery site, drainage, fever, or pain not relieved by medication.    Additional Instructions:        Please contact your physician with any problems or Same Day Surgery at [phone], Monday  through Friday 6 am to 4 pm, or [place] at  Main number at [phone].  

## 2022-09-21 NOTE — Transfer of Care (Signed)
Immediate Anesthesia Transfer of Care Note  Patient: Osborne Serio  Procedure(s) Performed: XI ROBOTIC ASSISTED LAPAROSCOPIC CHOLECYSTECTOMY (Abdomen) INDOCYANINE GREEN FLUORESCENCE IMAGING (ICG) (Abdomen)  Patient Location: PACU  Anesthesia Type:General  Level of Consciousness: awake, alert , and oriented  Airway & Oxygen Therapy: Patient Spontanous Breathing and Patient connected to face mask oxygen  Post-op Assessment: Report given to RN, Post -op Vital signs reviewed and stable, and Patient moving all extremities  Post vital signs: Reviewed and stable  Last Vitals:  Vitals Value Taken Time  BP 166/94 09/21/22 0900  Temp    Pulse 101 09/21/22 0903  Resp 23 09/21/22 0903  SpO2 94 % 09/21/22 0903  Vitals shown include unvalidated device data.  Last Pain:  Vitals:   09/21/22 0630  TempSrc: Temporal  PainSc: 6          Complications: No notable events documented.

## 2022-09-21 NOTE — Op Note (Signed)
Preoperative diagnosis: Cholelithiasis  Postoperative diagnosis: Same  Procedure: Robotic Assisted Laparoscopic Cholecystectomy.   Anesthesia: GETA   Surgeon: Dr. Hazle Quant  Wound Classification: Clean Contaminated  Indications: Patient is a 56 y.o. male developed right upper quadrant pain, nausea and on workup was found to have cholelithiasis with a normal common duct. Robotic Assisted Laparoscopic cholecystectomy was elected.  Findings:  Critical view of safety achieved Cystic duct and artery identified, ligated and divided Adequate hemostasis   Description of procedure: The patient was placed on the operating table in the supine position. General anesthesia was induced. A time-out was completed verifying correct patient, procedure, site, positioning, and implant(s) and/or special equipment prior to beginning this procedure. An orogastric tube was placed. The abdomen was prepped and draped in the usual sterile fashion.  An incision was made in a natural skin line below the umbilicus.  The fascia was elevated and the Veress needle inserted. Proper position was confirmed by aspiration and saline meniscus test.  The abdomen was insufflated with carbon dioxide to a pressure of 15 mmHg. The patient tolerated insufflation well. A 8-mm trocar was then inserted in optiview fashion.  The laparoscope was inserted and the abdomen inspected. No injuries from initial trocar placement were noted. Additional trocars were then inserted in the following locations: an 8-mm trocar in the left lateral abdomen, and another two 8-mm trocars to the right side of the abdomen 5 cm appart. The umbilical trocar was changed to a 12 mm trocar all under direct visualization. The abdomen was inspected and no abnormalities were found. The table was placed in the reverse Trendelenburg position with the right side up. The robotic arms were docked and target anatomy identified. Instrument inserted under direct  visualization.  Filmy adhesions between the gallbladder and omentum, duodenum and transverse colon were lysed with electrocautery. The dome of the gallbladder was grasped with a prograsp and retracted over the dome of the liver. The infundibulum was also grasped with an atraumatic grasper and retracted toward the right lower quadrant. This maneuver exposed Calot's triangle. The peritoneum overlying the gallbladder infundibulum was then incised and the cystic duct and cystic artery identified and circumferentially dissected. Critical view of safety reviewed before ligating any structure. Firefly images taken to visualize biliary ducts. The cystic duct and cystic artery were then doubly clipped and divided close to the gallbladder.  The gallbladder was then dissected from its peritoneal attachments by electrocautery. Hemostasis was checked and the gallbladder and contained stones were removed using an endoscopic retrieval bag. The gallbladder was passed off the table as a specimen. The gallbladder fossa was copiously irrigated with saline and hemostasis was obtained. There was no evidence of leakage of the bile from the cystic duct stump. Due to raw gallbladder bed with oozing, Vista seal was irrigated in the liver bed. Secondary trocars were removed under direct vision. No bleeding was noted. The robotic arms were undoked. The scope was withdrawn and the umbilical trocar removed. The abdomen was allowed to collapse. The fascia of the 67mm trocar sites was closed with figure-of-eight 0 vicryl sutures. The skin was closed with subcuticular sutures of 4-0 monocryl and topical skin adhesive. The orogastric tube was removed.  The patient tolerated the procedure well and was taken to the postanesthesia care unit in stable condition.   Specimen: Gallbladder  Complications: None  EBL: 5 mL

## 2022-09-21 NOTE — Anesthesia Procedure Notes (Signed)
Procedure Name: Intubation Date/Time: 09/21/2022 7:37 AM  Performed by: Mohammed Kindle, CRNAPre-anesthesia Checklist: Patient identified, Emergency Drugs available, Suction available and Patient being monitored Patient Re-evaluated:Patient Re-evaluated prior to induction Oxygen Delivery Method: Circle system utilized Preoxygenation: Pre-oxygenation with 100% oxygen Induction Type: IV induction Ventilation: Mask ventilation without difficulty Laryngoscope Size: McGraph and 3 Grade View: Grade I Tube type: Oral Tube size: 7.5 mm Number of attempts: 1 Airway Equipment and Method: Stylet and Oral airway Placement Confirmation: ETT inserted through vocal cords under direct vision, positive ETCO2, breath sounds checked- equal and bilateral and CO2 detector Secured at: 22 cm Tube secured with: Tape Dental Injury: Teeth and Oropharynx as per pre-operative assessment

## 2022-09-21 NOTE — Anesthesia Postprocedure Evaluation (Signed)
Anesthesia Post Note  Patient: Farhaan Mabee  Procedure(s) Performed: XI ROBOTIC ASSISTED LAPAROSCOPIC CHOLECYSTECTOMY (Abdomen) INDOCYANINE GREEN FLUORESCENCE IMAGING (ICG) (Abdomen)  Patient location during evaluation: PACU Anesthesia Type: General Level of consciousness: awake and alert, oriented and patient cooperative Pain management: pain level controlled Vital Signs Assessment: post-procedure vital signs reviewed and stable Respiratory status: spontaneous breathing, nonlabored ventilation and respiratory function stable Cardiovascular status: blood pressure returned to baseline and stable Postop Assessment: adequate PO intake Anesthetic complications: no   No notable events documented.   Last Vitals:  Vitals:   09/21/22 0915 09/21/22 0930  BP: (!) 141/81 128/74  Pulse: 96 99  Resp: 12 15  Temp: 36.4 C 36.4 C  SpO2: 94% 95%    Last Pain:  Vitals:   09/21/22 0930  TempSrc:   PainSc: 4                  Reed Breech

## 2022-09-22 LAB — SURGICAL PATHOLOGY

## 2022-09-29 ENCOUNTER — Other Ambulatory Visit: Payer: Self-pay | Admitting: Urology

## 2022-10-16 ENCOUNTER — Encounter: Payer: Self-pay | Admitting: General Surgery

## 2022-11-02 ENCOUNTER — Other Ambulatory Visit: Payer: Self-pay | Admitting: Urology

## 2022-11-04 ENCOUNTER — Other Ambulatory Visit: Payer: Self-pay | Admitting: Urology

## 2022-11-08 NOTE — Progress Notes (Signed)
11/02/2018  2:36 PM   [name] 1965/11/04 [id number]  Referring provider: Theotis Burrow, MD [address] [place],  [place] [address]  Urological history: 1. IC -incontinence, urgency, suprapubic pressure, intermittent burning symptoms for greater than 30 years -told that he had a "thinning of the bladder" after extensive evaluation in [place] at age 57.  He states he was taken to the OR and had what sounds like a cystoscopy with fulguration and hydro distention, also a test done in the office where they put in a solution and it burned which sounds like a potassium sensitivity test and also a solution that was placed in his bladder several times which sounds like rescues solutions.  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  Failed Myrbetriq ([place]) and Toviaz (urinary retention) -admits to depression, anxiety and PTSD -oxybutynin XL 15 mg daily   2. BPH with LU TS -PSA pending -I PSS *** -PVR ***  3. Ejaculatory disorder -unable to achieve ejaculation at times  No chief complaint on file.  HPI: Todd [name] is a 57 y.o. male male who presents today for yearly follow up.       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  Migraines 2-3x/yr.   HTN (hypertension) 05/19/2016   Hypertension    MDD (major depressive disorder), recurrent episode ([place]) 05/28/2016   Neck pain, bilateral 02/15/2016   Non-intractable vomiting with nausea  08/26/2017   Primary osteoarthritis of right knee 01/31/2016   and fingers   PTSD (post-traumatic stress disorder) 06/05/2016   Severe recurrent major depression without psychotic features ([place]) 05/19/2016   Sleep apnea    unable to tolerate CPAP   Substance induced mood disorder ([place]) 05/19/2016   Suicidal ideation 05/19/2016   Type 2 diabetes mellitus with complication, without long-term current use of insulin ([place]) 06/08/2017   Uncontrolled type 2 diabetes mellitus with hyperglycemia, without long-term current use of insulin ([place]) 03/12/2017   Vertigo     Surgical History: Past Surgical History:  Procedure Laterality Date   BACK SURGERY  11/209/18 and 10/08/17   x 2   CIRCUMCISION N/A 07/25/2020   Procedure: CIRCUMCISION ADULT;  Surgeon: Hollice Espy, MD;  Location: [facility] ORS;  Service: Urology;  Laterality: N/A;   COLONOSCOPY WITH PROPOFOL N/A 01/31/2018   Procedure: COLONOSCOPY WITH PROPOFOL;  Surgeon: Manya Silvas, MD;  Location[facility] END[facility]COPY;  Service: Endoscopy;  Laterality: N/A;   ESOPHAGOGASTRODUODENOSCOPY (EGD) WITH PROPOFOL N/A 01/31/2018   Procedure: ESOPHAGOGASTRODUODENOSCOPY (EGD) WITH PROPOFOL;  Surgeon: Manya Silvas, MD;  Location[facility] END[facility]COPY;  Service: Endoscopy;  Laterality: N/A;   LEG AMPUTATION BELOW KNEE     left   NASAL SEPTOPLASTY W/ TURBINOPLASTY Bilateral 04/17/2020   Procedure: NASAL SEPTOPLASTY WITH INFERIORTURBINATE REDUCTION;  Surgeon: Carloyn Manner, MD;  Location: [facility];  Service: ENT;  Laterality: Bilateral;  Diabetic - oral meds  SHOULDER SURGERY Left    SHOULDER SURGERY Right     Home Medications:  Allergies as of 11/09/2022       Reactions   Bactrim [sulfamethoxazole-trimethoprim] Itching, Other (See Comments)   Causes blisters   Myrbetriq [mirabegron] Other (See Comments)   Dizzy, headache   Benadryl [diphenhydramine] Anxiety, Other (See Comments)   Causes "jerking"   Flonase [fluticasone] Palpitations    Tamsulosin Rash        Medication List        Accurate as of November 08, 2022  2:36 PM. If you have any questions, ask your nurse or doctor.          atorvastatin 40 MG tablet Commonly known as: LIPITOR Take 40 mg by mouth daily.   clindamycin 300 MG capsule Commonly known as: CLEOCIN Take 1 capsule (300 mg total) by mouth 3 (three) times daily.   cloNIDine 0.2 MG tablet Commonly known as: CATAPRES Take 0.2 mg by mouth 2 (two) times daily.   Farxiga 10 MG Tabs tablet Generic drug: dapagliflozin propanediol Take by mouth daily.   hydrochlorothiazide 25 MG tablet Commonly known as: HYDRODIURIL Take 25 mg by mouth daily.   ibuprofen 200 MG tablet Commonly known as: ADVIL Take 400 mg by mouth every 6 (six) hours as needed for headache or moderate pain.   lisinopril 40 MG tablet Commonly known as: ZESTRIL Take 1 tablet (40 mg total) by mouth daily.   metoCLOPramide 10 MG tablet Commonly known as: REGLAN Take 10 mg by mouth 3 (three) times daily as needed for nausea or vomiting.   OMEGA-3 EPA FISH OIL PO Take by mouth.   omeprazole 40 MG capsule Commonly known as: PRILOSEC Take 40 mg by mouth 2 (two) times daily.   ondansetron 4 MG disintegrating tablet Commonly known as: ZOFRAN-ODT Take 4 mg by mouth 3 (three) times daily as needed for vomiting or nausea.   ondansetron 4 MG disintegrating tablet Commonly known as: Zofran ODT Take 1 tablet (4 mg total) by mouth every 8 (eight) hours as needed for nausea or vomiting.   oxybutynin 15 MG 24 hr tablet Commonly known as: DITROPAN XL TAKE 1 TABLET(15 MG) BY MOUTH DAILY   sitaGLIPtin 100 MG tablet Commonly known as: JANUVIA Take 100 mg by mouth daily.        Allergies:  Allergies  Allergen Reactions   Bactrim [Sulfamethoxazole-Trimethoprim] Itching and Other (See Comments)    Causes blisters   Myrbetriq [Mirabegron] Other (See Comments)    Dizzy, headache   Benadryl [Diphenhydramine] Anxiety and  Other (See Comments)    Causes "jerking"   Flonase [Fluticasone] Palpitations   Tamsulosin Rash    Family History: Family History  Problem Relation Age of Onset   Diabetes Mother     Social History:  reports that he quit smoking about 34 years ago. His smoking use included cigarettes. He has a 11.00 pack-year smoking history. He has never used smokeless tobacco. He reports that he does not drink alcohol and does not use drugs.  ROS: For pertinent review of systems please refer to history of present illness  Physical Exam: There were no vitals taken for this visit.  Constitutional:  Well nourished. Alert and oriented, No acute distress. HEENT: [place] AT, moist mucus membranes.  Trachea midline Cardiovascular: No clubbing, cyanosis, or edema. Respiratory: Normal respiratory effort, no increased work of breathing. GU: No CVA tenderness.  No bladder fullness or masses.  Patient with circumcised/uncircumcised phallus. ***Foreskin easily retracted***  Urethral  meatus is patent.  No penile discharge. No penile lesions or rashes. Scrotum without lesions, cysts, rashes and/or edema.  Testicles are located scrotally bilaterally. No masses are appreciated in the testicles. Left and right epididymis are normal. Rectal: Patient with  normal sphincter tone. Anus and perineum without scarring or rashes. No rectal masses are appreciated. Prostate is approximately *** grams, *** nodules are appreciated. Seminal vesicles are normal. Neurologic: Grossly intact, no focal deficits, moving all 4 extremities. *** Psychiatric: Normal mood and affect.   Laboratory Data: Hemoglobin A1c (09/2022) 6.7 Total cholesterol (09/2022) 104 Serum creatinine (09/2022) 1.1 Color, UA  Yellow  Clarity, UA  Clear  Specific Gravity, UA 1.003 - 1.030 1.043 High   pH, UA 5.0 - 9.0 5.5  Leukocyte Esterase, UA Negative Negative  Nitrite, UA Negative Negative  Protein, UA Negative Trace Abnormal   Glucose, UA Negative >1000  mg/dL Abnormal   Ketones, UA Negative >150 mg/dL Abnormal   Urobilinogen, UA <2.0 mg/dL <5.7 mg/dL  Bilirubin, UA Negative Negative  Blood, UA Negative Negative  RBC, UA <=3 /HPF 1  WBC, UA <=2 /HPF 2  Squam Epithel, UA 0 - 5 /HPF <1  Bacteria, UA None Seen /HPF None Seen  Mucus, UA None Seen /HPF Rare Abnormal   Resulting Agency  [facility]   Specimen Collected: 08/02/22 14:14   Performed by: [facility] Last Resulted: 08/02/22 14:38  Received From: [facility]  Result Received: 08/04/22 10:42   I have reviewed the labs.  Pertinent Imaging: ***   Assessment & Plan:    1. IC -UA benign -likely having a flair likely due to upcoming surgeries -continue oxybutynin XL 15 mg daily  2. BPH with LUTS -PSA pending -DRE benign -UA benign -PVR < 300 cc -continue conservative management, avoiding bladder irritants and timed voiding's    No follow-ups on file.  Cloretta Ned  [facility] [address] [place], [place] [address] [phone]

## 2022-11-09 ENCOUNTER — Ambulatory Visit (INDEPENDENT_AMBULATORY_CARE_PROVIDER_SITE_OTHER): Payer: 59 | Admitting: Urology

## 2022-11-09 ENCOUNTER — Encounter: Payer: Self-pay | Admitting: Urology

## 2022-11-09 VITALS — BP 155/88 | HR 93 | Ht 68.0 in | Wt 249.0 lb

## 2022-11-09 DIAGNOSIS — N401 Enlarged prostate with lower urinary tract symptoms: Secondary | ICD-10-CM

## 2022-11-09 DIAGNOSIS — N3010 Interstitial cystitis (chronic) without hematuria: Secondary | ICD-10-CM | POA: Diagnosis not present

## 2022-11-09 DIAGNOSIS — N138 Other obstructive and reflux uropathy: Secondary | ICD-10-CM | POA: Diagnosis not present

## 2022-11-09 LAB — BLADDER SCAN AMB NON-IMAGING: Scan Result: 184

## 2022-11-09 MED ORDER — TADALAFIL 5 MG PO TABS: 5.0000 mg | ORAL_TABLET | Freq: Every day | ORAL | 11 refills | Status: AC | PRN

## 2022-11-10 ENCOUNTER — Encounter: Payer: Self-pay | Admitting: *Deleted

## 2022-11-10 LAB — PSA: Prostate Specific Ag, Serum: 0.6 ng/mL (ref 0.0–4.0)

## 2022-11-25 ENCOUNTER — Other Ambulatory Visit: Payer: Self-pay | Admitting: Urology

## 2022-11-25 ENCOUNTER — Telehealth: Payer: Self-pay

## 2022-11-25 MED ORDER — ALFUZOSIN HCL ER 10 MG PO TB24: 10.0000 mg | ORAL_TABLET | Freq: Every day | ORAL | 0 refills | Status: DC

## 2022-11-25 NOTE — Telephone Encounter (Signed)
I just sent over a prescription for alfuzosin. Per [place] patient.

## 2022-11-25 NOTE — Telephone Encounter (Signed)
PA denied, please see alternatives to try first below. Decision Notes: Tadalafil (generic Cialis) is denied because it is not on your plan's Drug List (formulary). Medication authorization requires the following: (1) You need to try four (4) of these covered drugs: (a) Alfuzosin ER. (b) Doxazosin. (c) Silodosin. (d) Terazosin. (2) OR your doctor needs to give [place] s

## 2022-11-25 NOTE — Telephone Encounter (Signed)
PA submitted via covermymeds for Tadalafil. Pending. Key: [id number]

## 2022-11-25 NOTE — Telephone Encounter (Signed)
Patient states he can't pay out of pocket  right now for any medication . He will talk to you at his next appt.

## 2022-12-01 ENCOUNTER — Encounter: Admission: RE | Payer: Self-pay | Source: Home / Self Care

## 2022-12-01 ENCOUNTER — Ambulatory Visit: Admission: RE | Admit: 2022-12-01 | Payer: 59 | Source: Home / Self Care

## 2022-12-01 SURGERY — COLONOSCOPY WITH PROPOFOL
Anesthesia: General

## 2022-12-09 NOTE — Progress Notes (Deleted)
11/02/2018  8:45 AM   Todd [name] 06/02/1966 [id number]  Referring provider: Theotis Burrow, MD [address] [place],  [place] [address]  Urological history: 1. IC -incontinence, urgency, suprapubic pressure, intermittent burning symptoms for greater than 30 years -told that he had a "thinning of the bladder" after extensive evaluation in [place] at age 57.  He states he was taken to the OR and had what sounds like a cystoscopy with fulguration and hydro distention, also a test done in the office where they put in a solution and it burned which sounds like a potassium sensitivity test and also a solution that was placed in his bladder several times which sounds like rescues solutions.  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  Failed Myrbetriq (Dizzinness) and Toviaz (urinary retention) -admits to depression, anxiety and PTSD -oxybutynin XL 15 mg daily   2. BPH with LU TS -PSA (10/2022) 0.6 -I PSS *** -PVR *** mL  3. Ejaculatory disorder -unable to achieve ejaculation at times  No chief complaint on file.  HPI: Todd [name] is a 57 y.o. male male who presents today for a 1 month follow-up after a trial of tadalafil 5 mg daily.  At his visit on November 09, 2022, he was having issues with frequency, urgency, nocturia, straining as hesitancy.  He also admitted he had not been sleep with a CPAP machine.  He was also starting to note issues with memory.  PVR 184 mL.  We asked him to discontinue the oxybutynin and start tadalafil 5 mg daily in its place.  Unfortunately, his insurance would not cover the medication and he could not afford to pay for it out-of-pocket with good Rx coupon.  We then sent a prescription over for alfuzosin 10 mg daily.         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  Migraines 2-3x/yr.   HTN (hypertension) 05/19/2016   Hypertension    MDD (major depressive disorder), recurrent episode ([place]) 05/28/2016   Neck pain, bilateral 02/15/2016   Non-intractable vomiting with nausea 08/26/2017   Primary osteoarthritis of right knee 01/31/2016   and fingers   PTSD (post-traumatic stress disorder) 06/05/2016   Severe recurrent major depression without psychotic features ([place]) 05/19/2016   Sleep apnea    unable to tolerate CPAP   Substance induced mood disorder ([place]) 05/19/2016   Suicidal ideation 05/19/2016   Type 2 diabetes mellitus with complication, without long-term current use of insulin ([place]) 06/08/2017   Uncontrolled type 2 diabetes mellitus with hyperglycemia, without long-term current use of insulin ([place]) 03/12/2017   Vertigo     Surgical History: Past Surgical History:  Procedure Laterality Date   BACK SURGERY  11/209/18 and 10/08/17   x 2   CIRCUMCISION N/A 07/25/2020   Procedure: CIRCUMCISION ADULT;  Surgeon: Hollice Espy, MD;  Location: [facility] ORS;  Service: Urology;  Laterality: N/A;   COLONOSCOPY WITH PROPOFOL N/A 01/31/2018   Procedure: COLONOSCOPY WITH PROPOFOL;  Surgeon: Manya Silvas, MD;  Location: [facility] ENDOSCOPY;  Service: Endoscopy;  Laterality: N/A;   ESOPHAGOGASTRODUODENOSCOPY (EGD) WITH PROPOFOL N/A 01/31/2018   Procedure: ESOPHAGOGASTRODUODENOSCOPY (EGD) WITH PROPOFOL;  Surgeon: Manya Silvas, MD;  Location: [facility] ENDOSCOPY;  Service: Endoscopy;  Laterality: N/A;   LEG AMPUTATION BELOW KNEE     left   NASAL SEPTOPLASTY W/ TURBINOPLASTY Bilateral 04/17/2020   Procedure: NASAL SEPTOPLASTY WITH INFERIORTURBINATE REDUCTION;  Surgeon: Carloyn Manner, MD;   Location: [facility];  Service: ENT;  Laterality: Bilateral;  Diabetic - oral meds   SHOULDER SURGERY Left    SHOULDER SURGERY Right     Home Medications:  Allergies as of 12/10/2022       Reactions   Bactrim [sulfamethoxazole-trimethoprim] Itching, Other (See Comments)   Causes blisters   Myrbetriq [mirabegron] Other (See Comments)   Dizzy, headache   Benadryl [diphenhydramine] Anxiety, Other (See Comments)   Causes "jerking"   Flonase [fluticasone] Palpitations   Tamsulosin Rash        Medication List        Accurate as of December 09, 2022  8:45 AM. If you have any questions, ask your nurse or doctor.          alfuzosin 10 MG 24 hr tablet Commonly known as: UROXATRAL Take 1 tablet (10 mg total) by mouth daily with breakfast.   atorvastatin 40 MG tablet Commonly known as: LIPITOR Take 40 mg by mouth daily.   cloNIDine 0.2 MG tablet Commonly known as: CATAPRES Take 0.2 mg by mouth 2 (two) times daily.   Farxiga 10 MG Tabs tablet Generic drug: dapagliflozin propanediol Take by mouth daily.   hydrochlorothiazide 25 MG tablet Commonly known as: HYDRODIURIL Take 25 mg by mouth daily.   ibuprofen 200 MG tablet Commonly known as: ADVIL Take 400 mg by mouth every 6 (six) hours as needed for headache or moderate pain.   lisinopril 40 MG tablet Commonly known as: ZESTRIL Take 1 tablet (40 mg total) by mouth daily.   metoCLOPramide 10 MG tablet Commonly known as: REGLAN Take 10 mg by mouth 3 (three) times daily as needed for nausea or vomiting.   OMEGA-3 EPA FISH OIL PO Take by mouth.   omeprazole 40 MG capsule Commonly known as: PRILOSEC Take 40 mg by mouth 2 (two) times daily.   oxybutynin 15 MG 24 hr tablet Commonly known as: DITROPAN XL TAKE 1 TABLET(15 MG) BY MOUTH DAILY   sitaGLIPtin 100 MG tablet Commonly known as: JANUVIA Take 100 mg by mouth daily.   tadalafil 5 MG tablet Commonly known as: CIALIS Take 1 tablet (5 mg total) by  mouth daily as needed for erectile dysfunction.        Allergies:  Allergies  Allergen Reactions   Bactrim [Sulfamethoxazole-Trimethoprim] Itching and Other (See Comments)    Causes blisters   Myrbetriq [Mirabegron] Other (See Comments)    Dizzy, headache   Benadryl [Diphenhydramine] Anxiety and Other (See Comments)    Causes "jerking"   Flonase [Fluticasone] Palpitations   Tamsulosin Rash    Family History: Family History  Problem Relation Age of Onset   Diabetes Mother     Social History:  reports that he quit smoking about 34 years ago. His smoking use included cigarettes. He has a 11.00 pack-year smoking history. He has never used smokeless tobacco. He reports that he does not drink alcohol and does not use drugs.  ROS: For pertinent review of systems please refer to history of present illness  Physical Exam: There were no vitals  taken for this visit.  Constitutional:  Well nourished. Alert and oriented, No acute distress. HEENT: [place] AT, moist mucus membranes.  Trachea midline Cardiovascular: No clubbing, cyanosis, or edema. Respiratory: Normal respiratory effort, no increased work of breathing. Neurologic: Grossly intact, no focal deficits, moving all 4 extremities. Psychiatric: Normal mood and affect.   Laboratory Data: N/A  Pertinent Imaging: ***   Assessment & Plan:    1. BPH with LUTS -PSA stable  -PVR < 300 cc *** -continue conservative management, avoiding bladder irritants and timed voiding's ***  No follow-ups on file.  Veeda Virgo, [place] [address], [place],  [address] [phone]

## 2022-12-10 ENCOUNTER — Ambulatory Visit: Payer: 59 | Admitting: Urology

## 2022-12-14 NOTE — Progress Notes (Unsigned)
11/02/2018  8:45 AM   Todd [name] [birth year] [id number]  Referring provider: Theotis Burrow, MD [address] [place],  [place] [address]  Urological history: 1. IC -incontinence, urgency, suprapubic pressure, intermittent burning symptoms for greater than 30 years -told that he had a "thinning of the bladder" after extensive evaluation in [place] at age 57.  He states he was taken to the OR and had what sounds like a cystoscopy with fulguration and hydro distention, also a test done in the office where they put in a solution and it burned which sounds like a potassium sensitivity test and also a solution that was placed in his bladder several times which sounds like rescues solutions.  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  Failed Myrbetriq (Dizzinness) and Toviaz (urinary retention) -admits to depression, anxiety and PTSD -oxybutynin XL 15 mg daily   2. BPH with LU TS -PSA (10/2022) 0.6 -I PSS *** -PVR *** mL  3. Ejaculatory disorder -unable to achieve ejaculation at times  No chief complaint on file.  HPI: Todd [name] is a 57 y.o. male male who presents today for a 1 month follow-up after a trial of tadalafil 5 mg daily.  At his visit on November 09, 2022, he was having issues with frequency, urgency, nocturia, straining as hesitancy.  He also admitted he had not been sleep with a CPAP machine.  He was also starting to note issues with memory.  PVR 184 mL.  We asked him to discontinue the oxybutynin and start tadalafil 5 mg daily in its place.  Unfortunately, his insurance would not cover the medication and he could not afford to pay for it out-of-pocket with good Rx coupon.  We then sent a prescription over for alfuzosin 10 mg daily.         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  Migraines 2-3x/yr.   HTN (hypertension) 05/19/2016   Hypertension    MDD (major depressive disorder), recurrent episode ([place]) 05/28/2016   Neck pain, bilateral 02/15/2016   Non-intractable vomiting with nausea 08/26/2017   Primary osteoarthritis of right knee 01/31/2016   and fingers   PTSD (post-traumatic stress disorder) 06/05/2016   Severe recurrent major depression without psychotic features ([place]) 05/19/2016   Sleep apnea    unable to tolerate CPAP   Substance induced mood disorder ([place]) 05/19/2016   Suicidal ideation 05/19/2016   Type 2 diabetes mellitus with complication, without long-term current use of insulin ([place]) 06/08/2017   Uncontrolled type 2 diabetes mellitus with hyperglycemia, without long-term current use of insulin ([place]) 03/12/2017   Vertigo     Surgical History: Past Surgical History:  Procedure Laterality Date   BACK SURGERY  11/209/18 and 10/08/17   x 2   CIRCUMCISION N/A 07/25/2020   Procedure: CIRCUMCISION ADULT;  Surgeon: Hollice Espy, MD;  Location: [facility] ORS;  Service: Urology;  Laterality: N/A;   COLONOSCOPY WITH PROPOFOL N/A 01/31/2018   Procedure: COLONOSCOPY WITH PROPOFOL;  Surgeon: Manya Silvas, MD;  Location: [facility] ENDOSCOPY;  Service: Endoscopy;  Laterality: N/A;   ESOPHAGOGASTRODUODENOSCOPY (EGD) WITH PROPOFOL N/A 01/31/2018   Procedure: ESOPHAGOGASTRODUODENOSCOPY (EGD) WITH PROPOFOL;  Surgeon: Manya Silvas, MD;  Location[facility] ENDOSCOPY;  Service: Endoscopy;  Laterality: N/A;   LEG AMPUTATION BELOW KNEE     left   NASAL SEPTOPLASTY W/ TURBINOPLASTY Bilateral 04/17/2020   Procedure: NASAL SEPTOPLASTY WITH INFERIORTURBINATE REDUCTION;  Surgeon: Carloyn Manner, MD;   Location: [facility];  Service: ENT;  Laterality: Bilateral;  Diabetic - oral meds   SHOULDER SURGERY Left    SHOULDER SURGERY Right     Home Medications:  Allergies as of 12/10/2022       Reactions   Bactrim [sulfamethoxazole-trimethoprim] Itching, Other (See Comments)   Causes blisters   Myrbetriq [mirabegron] Other (See Comments)   Dizzy, headache   Benadryl [diphenhydramine] Anxiety, Other (See Comments)   Causes "jerking"   Flonase [fluticasone] Palpitations   Tamsulosin Rash        Medication List        Accurate as of December 09, 2022  8:45 AM. If you have any questions, ask your nurse or doctor.          alfuzosin 10 MG 24 hr tablet Commonly known as: UROXATRAL Take 1 tablet (10 mg total) by mouth daily with breakfast.   atorvastatin 40 MG tablet Commonly known as: LIPITOR Take 40 mg by mouth daily.   cloNIDine 0.2 MG tablet Commonly known as: CATAPRES Take 0.2 mg by mouth 2 (two) times daily.   Farxiga 10 MG Tabs tablet Generic drug: dapagliflozin propanediol Take by mouth daily.   hydrochlorothiazide 25 MG tablet Commonly known as: HYDRODIURIL Take 25 mg by mouth daily.   ibuprofen 200 MG tablet Commonly known as: ADVIL Take 400 mg by mouth every 6 (six) hours as needed for headache or moderate pain.   lisinopril 40 MG tablet Commonly known as: ZESTRIL Take 1 tablet (40 mg total) by mouth daily.   metoCLOPramide 10 MG tablet Commonly known as: REGLAN Take 10 mg by mouth 3 (three) times daily as needed for nausea or vomiting.   OMEGA-3 EPA FISH OIL PO Take by mouth.   omeprazole 40 MG capsule Commonly known as: PRILOSEC Take 40 mg by mouth 2 (two) times daily.   oxybutynin 15 MG 24 hr tablet Commonly known as: DITROPAN XL TAKE 1 TABLET(15 MG) BY MOUTH DAILY   sitaGLIPtin 100 MG tablet Commonly known as: JANUVIA Take 100 mg by mouth daily.   tadalafil 5 MG tablet Commonly known as: CIALIS Take 1 tablet (5 mg total) by  mouth daily as needed for erectile dysfunction.        Allergies:  Allergies  Allergen Reactions   Bactrim [Sulfamethoxazole-Trimethoprim] Itching and Other (See Comments)    Causes blisters   Myrbetriq [Mirabegron] Other (See Comments)    Dizzy, headache   Benadryl [Diphenhydramine] Anxiety and Other (See Comments)    Causes "jerking"   Flonase [Fluticasone] Palpitations   Tamsulosin Rash    Family History: Family History  Problem Relation Age of Onset   Diabetes Mother     Social History:  reports that he quit smoking about 34 years ago. His smoking use included cigarettes. He has a 11.00 pack-year smoking history. He has never used smokeless tobacco. He reports that he does not drink alcohol and does not use drugs.  ROS: For pertinent review of systems please refer to history of present illness  Physical Exam: There were no vitals  taken for this visit.  Constitutional:  Well nourished. Alert and oriented, No acute distress. HEENT: Todd [name] AT, moist mucus membranes.  Trachea midline Cardiovascular: No clubbing, cyanosis, or edema. Respiratory: Normal respiratory effort, no increased work of breathing. Neurologic: Grossly intact, no focal deficits, moving all 4 extremities. Psychiatric: Normal mood and affect.   Laboratory Data: N/A  Pertinent Imaging: ***   Assessment & Plan:    1. BPH with LUTS -PSA stable  -PVR < 300 cc *** -continue conservative management, avoiding bladder irritants and timed voiding's ***  No follow-ups on file.  San Loh[place]yer, [place] [address], [place], [place] [address] [phone]

## 2022-12-15 ENCOUNTER — Encounter: Payer: Self-pay | Admitting: Urology

## 2022-12-15 ENCOUNTER — Ambulatory Visit (INDEPENDENT_AMBULATORY_CARE_PROVIDER_SITE_OTHER): Payer: 59 | Admitting: Urology

## 2022-12-15 VITALS — BP 136/64 | HR 79 | Ht 68.0 in | Wt 238.0 lb

## 2022-12-15 DIAGNOSIS — N3010 Interstitial cystitis (chronic) without hematuria: Secondary | ICD-10-CM | POA: Diagnosis not present

## 2022-12-15 DIAGNOSIS — N138 Other obstructive and reflux uropathy: Secondary | ICD-10-CM

## 2022-12-15 DIAGNOSIS — N401 Enlarged prostate with lower urinary tract symptoms: Secondary | ICD-10-CM | POA: Diagnosis not present

## 2022-12-15 DIAGNOSIS — F451 Undifferentiated somatoform disorder: Secondary | ICD-10-CM

## 2022-12-15 DIAGNOSIS — R3915 Urgency of urination: Secondary | ICD-10-CM

## 2022-12-15 LAB — BLADDER SCAN AMB NON-IMAGING: Scan Result: 92

## 2023-01-11 ENCOUNTER — Encounter: Payer: Self-pay | Admitting: Urology

## 2023-01-11 ENCOUNTER — Ambulatory Visit
Admission: RE | Admit: 2023-01-11 | Discharge: 2023-01-11 | Disposition: A | Discharge status: Home / Self Care | Payer: 59 | Source: Ambulatory Visit | Attending: Urology | Admitting: Urology

## 2023-01-11 ENCOUNTER — Ambulatory Visit (INDEPENDENT_AMBULATORY_CARE_PROVIDER_SITE_OTHER): Payer: 59 | Admitting: Urology

## 2023-01-11 ENCOUNTER — Other Ambulatory Visit: Payer: Self-pay | Admitting: Family Medicine

## 2023-01-11 ENCOUNTER — Ambulatory Visit
Admission: RE | Admit: 2023-01-11 | Discharge: 2023-01-11 | Disposition: A | Discharge status: Home / Self Care | Payer: 59 | Attending: Urology | Admitting: Urology

## 2023-01-11 VITALS — BP 141/88 | HR 73 | Ht 68.0 in | Wt 252.0 lb

## 2023-01-11 DIAGNOSIS — N3281 Overactive bladder: Secondary | ICD-10-CM

## 2023-01-11 DIAGNOSIS — N50819 Testicular pain, unspecified: Secondary | ICD-10-CM

## 2023-01-11 DIAGNOSIS — N401 Enlarged prostate with lower urinary tract symptoms: Secondary | ICD-10-CM | POA: Diagnosis not present

## 2023-01-11 DIAGNOSIS — N200 Calculus of kidney: Secondary | ICD-10-CM

## 2023-01-11 DIAGNOSIS — R3915 Urgency of urination: Secondary | ICD-10-CM

## 2023-01-11 LAB — URINALYSIS, COMPLETE
Bilirubin, UA: NEGATIVE
Ketones, UA: NEGATIVE
Leukocytes,UA: NEGATIVE
Nitrite, UA: NEGATIVE
Protein,UA: NEGATIVE
RBC, UA: NEGATIVE
Specific Gravity, UA: 1.01 (ref 1.005–1.030)
Urobilinogen, Ur: 0.2 mg/dL (ref 0.2–1.0)
pH, UA: 5 (ref 5.0–7.5)

## 2023-01-11 LAB — MICROSCOPIC EXAMINATION
Bacteria, UA: NONE SEEN
RBC, Urine: NONE SEEN /hpf (ref 0–2)

## 2023-01-11 NOTE — Progress Notes (Signed)
11/02/2018  12:19 PM   [name] [birth year] [id number]  Referring provider: Theotis Burrow, MD [address] [place],  [place] [address]  Urological history: 1. IC -incontinence, urgency, suprapubic pressure, intermittent burning symptoms for greater than 30 years -told that he had a "thinning of the bladder" after extensive evaluation in [place] at age 57.  He states he was taken to the OR and had what sounds like a cystoscopy with fulguration and hydro distention, also a test done in the office where they put in a solution and it burned which sounds like a potassium sensitivity test and also a solution that was placed in his bladder several times which sounds like rescues solutions.  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  Failed Myrbetriq (Dizzinness) and Toviaz (urinary retention) -admits to depression, anxiety and PTSD -oxybutynin XL 15 mg daily   2. BPH with LU TS -PSA (10/2022) 0.6 -PVR 92 mL  3. Ejaculatory disorder -unable to achieve ejaculation at times  Chief Complaint  Patient presents with   Nephrolithiasis   Testicle Pain   HPI: Kas Shyne is a 57 y.o. male who presents today for a 1 month follow-up after a trial of tadalafil 5 mg daily.  At his visit on November 09, 2022, he was having issues with frequency, urgency, nocturia, straining as hesitancy.  He also admitted he had not been sleep with a CPAP machine.  He was also starting to note issues with memory.  PVR 184 mL.  We asked him to discontinue the oxybutynin and start tadalafil 5 mg daily in its place.  Unfortunately, his insurance would not cover the medication and he could not afford to pay for it out-of-pocket with good Rx coupon.  We then sent a prescription over for alfuzosin 10 mg daily.  I PSS 24/6.  PVR 92 mL.    At his visit on 12/15/2022, he did not start the alfuzosin because he didn't like what he read about the side effects.  He does not want to take medications for his  urinary issues.  He is wondering why he has never been offered UroLift.  Patient denies any modifying or aggravating factors.  Patient denies any gross hematuria, dysuria or suprapubic/flank pain.  Patient denies any fevers, chills, nausea or vomiting.  He is also frustrated with his various areas of pain such as his leg, shoulder, back and bladder.  He feels that when he sees providers all they want to do is give him medication and he does not want to take medication.  I referred him for UDS and gave him a list of local providers that specialize in cognitive behavioral therapy.  Today, he is having pain in his testicles and lower back pain.  He had discontinued the oxybutynin at the March appointment and he also had decreased his water intake, because he was just tired of drinking water.  Last Thursday, he noted a strong smell to his urine.  He is also had an increase in his suprapubic pain, frequency and nocturia.  He then was helping his father with yard work over the weekend and developed bilateral groin pain and lower back pain.  UA is yellow clear, 3+ glucose, specific gravity 1.010, pH 5.0, 0-5 WBCs, no RBCs, 0-10 epithelial cells and no renal epithelial cells.  KUB 3 mm stone in the left kidney region. Moderate to large stool burden.   He canceled his urodynamics appointment, but I encouraged him to reschedule it.  He also is seeing a Marine scientist.  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  Migraines 2-3x/yr.   HTN (hypertension) 05/19/2016   Hypertension    MDD (major depressive disorder), recurrent episode 05/28/2016   Neck  pain, bilateral 02/15/2016   Non-intractable vomiting with nausea 08/26/2017   Primary osteoarthritis of right knee 01/31/2016   and fingers   PTSD (post-traumatic stress disorder) 06/05/2016   Severe recurrent major depression without psychotic features 05/19/2016   Sleep apnea    unable to tolerate CPAP   Substance induced mood disorder 05/19/2016   Suicidal ideation 05/19/2016   Type 2 diabetes mellitus with complication, without long-term current use of insulin 06/08/2017   Uncontrolled type 2 diabetes mellitus with hyperglycemia, without long-term current use of insulin 03/12/2017   Vertigo     Surgical History: Past Surgical History:  Procedure Laterality Date   BACK SURGERY  11/209/18 and 10/08/17   x 2   CIRCUMCISION N/A 07/25/2020   Procedure: CIRCUMCISION ADULT;  Surgeon: Hollice Espy, MD;  Location: [facility] ORS;  Service: Urology;  Laterality: N/A;   COLONOSCOPY WITH PROPOFOL N/A 01/31/2018   Procedure: COLONOSCOPY WITH PROPOFOL;  Surgeon: Manya Silvas, MD;  Location: [facility] ENDOSCOPY;  Service: Endoscopy;  Laterality: N/A;   ESOPHAGOGASTRODUODENOSCOPY (EGD) WITH PROPOFOL N/A 01/31/2018   Procedure: ESOPHAGOGASTRODUODENOSCOPY (EGD) WITH PROPOFOL;  Surgeon: Manya Silvas, MD;  Location: [facility] ENDOSCOPY;  Service: Endoscopy;  Laterality: N/A;   LEG AMPUTATION BELOW KNEE     left   NASAL SEPTOPLASTY W/ TURBINOPLASTY Bilateral 04/17/2020   Procedure: NASAL SEPTOPLASTY WITH INFERIORTURBINATE REDUCTION;  Surgeon: Carloyn Manner, MD;  Location: [place];  Service: ENT;  Laterality: Bilateral;  Diabetic - oral meds   SHOULDER SURGERY Left    SHOULDER SURGERY Right     Home Medications:  Allergies as of 01/11/2023       Reactions   Bactrim [sulfamethoxazole-trimethoprim] Itching, Other (See Comments)   Causes blisters   Myrbetriq [mirabegron] Other (See Comments)   Dizzy, headache   Benadryl [diphenhydramine] Anxiety, Other (See Comments)   Causes  "jerking"   Flonase [fluticasone] Palpitations   Tamsulosin Rash        Medication List        Accurate as of January 11, 2023 12:19 PM. If you have any questions, ask your nurse or doctor.          alfuzosin 10 MG 24 hr tablet Commonly known as: UROXATRAL Take 1 tablet (10 mg total) by mouth daily with breakfast.   atorvastatin 40 MG tablet Commonly known as: LIPITOR Take 40 mg by mouth daily.   cloNIDine 0.2 MG tablet Commonly known as: CATAPRES Take 0.2 mg by mouth 2 (two) times daily.   Farxiga 10 MG Tabs tablet Generic drug: dapagliflozin propanediol Take by mouth daily.   hydrochlorothiazide 25 MG tablet Commonly known as: HYDRODIURIL Take 25 mg by mouth daily.   ibuprofen 200 MG tablet Commonly known as: ADVIL Take 400 mg by mouth every 6 (six) hours as needed for headache or moderate pain.   lisinopril 40  MG tablet Commonly known as: ZESTRIL Take 1 tablet (40 mg total) by mouth daily.   metoCLOPramide 10 MG tablet Commonly known as: REGLAN Take 10 mg by mouth 3 (three) times daily as needed for nausea or vomiting.   OMEGA-3 EPA FISH OIL PO Take by mouth.   omeprazole 40 MG capsule Commonly known as: PRILOSEC Take 40 mg by mouth 2 (two) times daily.   oxybutynin 15 MG 24 hr tablet Commonly known as: DITROPAN XL TAKE 1 TABLET(15 MG) BY MOUTH DAILY   sitaGLIPtin 100 MG tablet Commonly known as: JANUVIA Take 100 mg by mouth daily.   tadalafil 5 MG tablet Commonly known as: CIALIS Take 1 tablet (5 mg total) by mouth daily as needed for erectile dysfunction.        Allergies:  Allergies  Allergen Reactions   Bactrim [Sulfamethoxazole-Trimethoprim] Itching and Other (See Comments)    Causes blisters   Myrbetriq [Mirabegron] Other (See Comments)    Dizzy, headache   Benadryl [Diphenhydramine] Anxiety and Other (See Comments)    Causes "jerking"   Flonase [Fluticasone] Palpitations   Tamsulosin Rash    Family History: Family History   Problem Relation Age of Onset   Diabetes Mother     Social History:  reports that he quit smoking about 34 years ago. His smoking use included cigarettes. He has a 11.00 pack-year smoking history. He has never used smokeless tobacco. He reports that he does not drink alcohol and does not use drugs.  ROS: For pertinent review of systems please refer to history of present illness  Physical Exam: BP (!) 141/88   Pulse 73   Ht 5\' 8"  (1.727 m)   Wt 252 lb (114.3 kg)   BMI 38.32 kg/m   Constitutional:  Well nourished. Alert and oriented, No acute distress. HEENT: [place] AT, moist mucus membranes.  Trachea midline Cardiovascular: No clubbing, cyanosis, or edema. Respiratory: Normal respiratory effort, no increased work of breathing. GU: No CVA tenderness.  No bladder fullness or masses.  Patient with circumcised phallus.  Urethral meatus is patent.  No penile discharge. No penile lesions or rashes. Scrotum without lesions, cysts, rashes and/or edema.  Testicles are located scrotally bilaterally. No masses are appreciated in the testicles. Left and right epididymis are normal. Neurologic: Grossly intact, no focal deficits, moving all 4 extremities. Psychiatric: Normal mood and affect.   Laboratory Data: N/A  Pertinent Imaging: Narrative & Impression  CLINICAL DATA:  Kidney stones.   EXAM: ABDOMEN - 1 VIEW   COMPARISON:  CT abdomen and pelvis 06/10/2020   FINDINGS: Patient has a known malrotated and low lying left kidney. There is a 3 mm stone in the region of the left kidney which is similar to the CT in 2021. Question additional tiny stones in left kidney. Pelvic calcifications are compatible with phleboliths. No definite right renal calculi. Moderate to large amount of stool in the abdomen particularly on the right side. Nonobstructive bowel gas pattern. Surgical hardware at lumbosacral junction.   IMPRESSION: 1. 3 mm stone in the left kidney region. Question additional  tiny stones in the left kidney. 2. Moderate to large stool burden.     Electronically Signed   By: Markus Daft M.D.   On: 01/11/2023 10:27    I have independently reviewed the films.  See HPI.      Assessment & Plan:    1. BPH with LUTS -PSA stable  -PVR < 300 cc  -continue conservative management, avoiding bladder irritants and  timed voiding's -Explained why urodynamic study is important for him at this time as we need to further delineate whether his urinary symptoms are from an irritative/storage issue versus an obstructive issue -He will call back and reschedule  2. OAB -Explained that he may have worsening of his symptoms due to the fact that he is discontinue the oxybutynin and that his testicular exam is benign -Explained that the UA was benign, but it is being sent for culture to rule out indolent infection -UDS is pending   Return for pending UDS results .  Azora Bonzo, [place] [address], [place], [place] [address] [phone]

## 2023-01-13 ENCOUNTER — Other Ambulatory Visit: Payer: Self-pay | Admitting: Urology

## 2023-01-14 LAB — CULTURE, URINE COMPREHENSIVE

## 2023-01-18 ENCOUNTER — Telehealth: Payer: Self-pay | Admitting: Family Medicine

## 2023-01-18 LAB — MYCOPLASMA / UREAPLASMA CULTURE
Mycoplasma hominis Culture: NEGATIVE
Ureaplasma urealyticum: NEGATIVE

## 2023-01-18 NOTE — Telephone Encounter (Signed)
Patient returned call. He had study done this morning.

## 2023-01-18 NOTE — Telephone Encounter (Signed)
-----   Message from Harle Battiest, PA-C sent at 01/18/2023  8:09 AM EDT ----- Please let Mr. Alonge know that his urine culture for atypical's was negative.  Has he scheduled his UDS?

## 2023-01-18 NOTE — Telephone Encounter (Signed)
LMOM informed of negative urine culture and to return call with UDS scheduled date.

## 2023-01-24 NOTE — Progress Notes (Unsigned)
11/02/2018  6:54 PM   Todd [name] 1966-03-18 [id number]  Referring provider: Preston Fleeting, MD [address] [place],  [place] [address]  Urological history: 1. IC -incontinence, urgency, suprapubic pressure, intermittent burning symptoms for greater than 30 years -told that he had a "thinning of the bladder" after extensive evaluation in [place], [place] at age 57.  He states he was taken to the OR and had what sounds like a cystoscopy with fulguration and hydro distention, also a test done in the office where they put in a solution and it burned which sounds like a potassium sensitivity test and also a solution that was placed in his bladder several times which sounds like rescues solutions.  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  Failed Myrbetriq (Dizzinness) and Toviaz (urinary retention) -admits to depression, anxiety and PTSD -oxybutynin XL 15 mg daily   2. BPH with LU TS -PSA (10/2022) 0.6  3. Ejaculatory disorder -unable to achieve ejaculation at times  No chief complaint on file.  HPI: Todd [name] is a 57 y.o. male who presents today to discuss UDS results.   At his visit on November 09, 2022, he was having issues with frequency, urgency, nocturia, straining as hesitancy.  He also admitted he had not been sleep with a CPAP machine.  He was also starting to note issues with memory.  PVR 184 mL.  We asked him to discontinue the oxybutynin and start tadalafil 5 mg daily in its place.  Unfortunately, his insurance would not cover the medication and he could not afford to pay for it out-of-pocket with good Rx coupon.  We then sent a prescription over for alfuzosin 10 mg daily.  I PSS 24/6.  PVR 92 mL.    At his visit on 12/15/2022, he did not start the alfuzosin because he didn't like what he read about the side effects.  He does not want to take medications for his urinary issues.  He is wondering why he has never been offered UroLift.  Patient denies any modifying or  aggravating factors.  Patient denies any gross hematuria, dysuria or suprapubic/flank pain.  Patient denies any fevers, chills, nausea or vomiting.  He is also frustrated with his various areas of pain such as his leg, shoulder, back and bladder.  He feels that when he sees providers all they want to do is give him medication and he does not want to take medication.  I referred him for UDS and gave him a list of local providers that specialize in cognitive behavioral therapy.  At his visit on 01/11/2023, he is having pain in his testicles and lower back pain.  He had discontinued the oxybutynin at the March appointment and he also had decreased his water intake, because he was just tired of drinking water.  Last Thursday, he noted a strong smell to his urine.  He is also had an increase in his suprapubic pain, frequency and nocturia.  He then was helping his father with yard work over the weekend and developed bilateral groin pain and lower back pain.  UA is yellow clear, 3+ glucose, specific gravity 1.010, pH 5.0, 0-5 WBCs, no RBCs, 0-10 epithelial cells and no renal epithelial cells.  KUB 3 mm stone in the left kidney region. Moderate to large stool burden. He canceled his urodynamics appointment, but I encouraged him to reschedule it.  He also is seeing a local therapist.  UDS completed on 01/18/2023 revealed a max capacity ~ 362  cc, first sensation occurred ~ 290 cc and normal desire occurred ~ 297 cc and a strong desire occurred at ~ 329 cc.  Bladder was stable.  He had no leakage.  He was able to generate a voluntary contraction and voided. His voided volume ~ 321 cc with max flow was 5 mL/s.  Detrussor pressure at peak flow was 57 cmH20.  Max detrusor presusure was 75 cm H20.  PVR ~ 40 cc.  Intermittent increase in EMG noted during voiding.  Some elevation at the bladder base was noted and no reflux seen.    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  Migraines 2-3x/yr.   HTN (hypertension) 05/19/2016   Hypertension    MDD (major depressive disorder), recurrent episode 05/28/2016   Neck pain, bilateral 02/15/2016   Non-intractable vomiting with nausea 08/26/2017   Primary osteoarthritis of right knee 01/31/2016   and fingers   PTSD (post-traumatic stress disorder) 06/05/2016   Severe recurrent major depression without psychotic features 05/19/2016   Sleep apnea    unable to tolerate CPAP   Substance induced mood disorder 05/19/2016   Suicidal ideation 05/19/2016   Type 2 diabetes mellitus with complication, without long-term current use of insulin 06/08/2017   Uncontrolled type 2 diabetes mellitus with hyperglycemia, without long-term current use of insulin 03/12/2017   Vertigo     Surgical History: Past Surgical History:  Procedure Laterality Date   BACK SURGERY  11/209/18 and 10/08/17   x 2   CIRCUMCISION N/A 07/25/2020   Procedure: CIRCUMCISION ADULT;  Surgeon: [name], MD;  Location: [facility] ORS;  Service: Urology;  Laterality: N/A;   COLONOSCOPY WITH PROPOFOL N/A 01/31/2018   Procedure: COLONOSCOPY WITH PROPOFOL;  Surgeon: Scot Jun, MD;  Location: [facility] ENDOSCOPY;  Service: Endoscopy;  Laterality: N/A;   ESOPHAGOGASTRODUODENOSCOPY (EGD) WITH PROPOFOL N/A 01/31/2018   Procedure: ESOPHAGOGASTRODUODENOSCOPY (EGD) WITH PROPOFOL;  Surgeon: Scot Jun, MD;  Location: [facility] ENDOSCOPY;  Service: Endoscopy;  Laterality: N/A;   LEG AMPUTATION BELOW KNEE     left   NASAL SEPTOPLASTY W/ TURBINOPLASTY Bilateral 04/17/2020   Procedure: NASAL SEPTOPLASTY WITH INFERIORTURBINATE REDUCTION;  Surgeon: [name], MD;  Location: [facility];  Service: ENT;  Laterality: Bilateral;  Diabetic - oral meds   SHOULDER SURGERY Left    SHOULDER SURGERY Right     Home Medications:  Allergies as of 01/25/2023       Reactions   Bactrim [sulfamethoxazole-trimethoprim] Itching, Other (See Comments)   Causes blisters   Myrbetriq [mirabegron] Other (See Comments)   Dizzy, headache   Benadryl [diphenhydramine] Anxiety, Other (See Comments)   Causes "jerking"   Flonase [fluticasone] Palpitations   Tamsulosin Rash        Medication List        Accurate as of January 24, 2023  6:54 PM. If you have any questions, ask your nurse or doctor.          alfuzosin 10 MG 24 hr tablet Commonly known as: UROXATRAL TAKE 1 TABLET(10 MG) BY MOUTH DAILY WITH BREAKFAST   atorvastatin 40  MG tablet Commonly known as: LIPITOR Take 40 mg by mouth daily.   cloNIDine 0.2 MG tablet Commonly known as: CATAPRES Take 0.2 mg by mouth 2 (two) times daily.   Farxiga 10 MG Tabs tablet Generic drug: dapagliflozin propanediol Take by mouth daily.   hydrochlorothiazide 25 MG tablet Commonly known as: HYDRODIURIL Take 25 mg by mouth daily.   ibuprofen 200 MG tablet Commonly known as: ADVIL Take 400 mg by mouth every 6 (six) hours as needed for headache or moderate pain.   lisinopril 40 MG tablet Commonly known as: ZESTRIL Take 1 tablet (40 mg total) by mouth daily.   metoCLOPramide 10 MG tablet Commonly known as: REGLAN Take 10 mg by mouth 3 (three) times daily as needed for nausea or vomiting.   OMEGA-3 EPA FISH OIL PO Take by mouth.   omeprazole 40 MG capsule Commonly known as: PRILOSEC Take 40 mg by mouth 2 (two) times daily.   oxybutynin 15 MG 24 hr tablet Commonly known as: DITROPAN XL TAKE 1 TABLET(15 MG) BY MOUTH DAILY   sitaGLIPtin 100 MG tablet Commonly known as: JANUVIA Take 100 mg by mouth daily.   tadalafil 5 MG tablet Commonly known as: CIALIS Take 1 tablet (5 mg total)  by mouth daily as needed for erectile dysfunction.        Allergies:  Allergies  Allergen Reactions   Bactrim [Sulfamethoxazole-Trimethoprim] Itching and Other (See Comments)    Causes blisters   Myrbetriq [Mirabegron] Other (See Comments)    Dizzy, headache   Benadryl [Diphenhydramine] Anxiety and Other (See Comments)    Causes "jerking"   Flonase [Fluticasone] Palpitations   Tamsulosin Rash    Family History: Family History  Problem Relation Age of Onset   Diabetes Mother     Social History:  reports that he quit smoking about 34 years ago. His smoking use included cigarettes. He has a 11.00 pack-year smoking history. He has never used smokeless tobacco. He reports that he does not drink alcohol and does not use drugs.  ROS: For pertinent review of systems please refer to history of present illness  Physical Exam: There were no vitals taken for this visit.  Constitutional:  Well nourished. Alert and oriented, No acute distress. HEENT: [place] AT, moist mucus membranes.  Trachea midline Cardiovascular: No clubbing, cyanosis, or edema. Respiratory: Normal respiratory effort, no increased work of breathing. GU: No CVA tenderness.  No bladder fullness or masses.  Patient with circumcised/uncircumcised phallus. ***Foreskin easily retracted***  Urethral meatus is patent.  No penile discharge. No penile lesions or rashes. Scrotum without lesions, cysts, rashes and/or edema.  Testicles are located scrotally bilaterally. No masses are appreciated in the testicles. Left and right epididymis are normal. Rectal: Patient with  normal sphincter tone. Anus and perineum without scarring or rashes. No rectal masses are appreciated. Prostate is approximately *** grams, *** nodules are appreciated. Seminal vesicles are normal. Neurologic: Grossly intact, no focal deficits, moving all 4 extremities. Psychiatric: Normal mood and affect.   Laboratory Data: N/A  Pertinent Imaging: See  media   Assessment & Plan:    1. BPH with LU TS -worsening LU TS despite the use of OAB and BPH medications -explained that the UDS demonstrating possible detrusor under activity which may be the result of BPH, urethral stricture disease, medications or neurological disorders  -will schedule cysto to rule out BPH and/or stricture disease --Explained that the cystoscopy is a safe and common diagnostic test performed by one of our physicians in  the office.  It consist of using a thin, lighted tube to look directly inside the bladder, prostate and urethra to evaluate the anatomy.  The procedure is brief, typically taking about 5 minutes. -This will unable [place] to assess bladder health, diagnose and enlarged prostate, assess which BPH procedure may be most appropriate and rule out other bladder conditions (stricture disease, stones, cancer, etc.)  -Advised the patient that there are no restrictions to eating or drinking prior to the cystoscopy -They can continue to take all of their medications as prescribed -They can drive themselves to and from the appointment -I explained that during the procedure, the area around the urethra will be cleansed thoroughly, topical anesthetic will be applied to numb your urethra, the thin tube is then gently inserted through the urethra into your bladder while fluid flows through the tube to the bladder to enable better visualization -I explained the procedure is usually not painful, however there may be some discomfort (pinching feeling), and they may feel an urge to urinate, coolness or fullness in the bladder and then the cystoscope is removed -After the cystoscopy, I advised them that they may experience urinary frequency, and infection, hematuria, dysuria which will resolve within 24 to 48 hours -Reviewed red flag signs (fever, bright red blood or blood clots in the urine, abdominal pain or difficulty urinating) and to contact the office immediately or seek treatment  in the ED if they should experience any of these -The physician will discuss the results of the cystoscopy at the time of the procedure   2. OAB -  No follow-ups on file.  Cloretta Ned  [facility] [address] [place], [place] [address] [phone]

## 2023-01-25 ENCOUNTER — Ambulatory Visit (INDEPENDENT_AMBULATORY_CARE_PROVIDER_SITE_OTHER): Payer: 59 | Admitting: Urology

## 2023-01-25 ENCOUNTER — Encounter: Payer: Self-pay | Admitting: Urology

## 2023-01-25 VITALS — BP 137/79 | HR 86 | Ht 68.0 in | Wt 252.0 lb

## 2023-01-25 DIAGNOSIS — N138 Other obstructive and reflux uropathy: Secondary | ICD-10-CM

## 2023-01-25 DIAGNOSIS — N401 Enlarged prostate with lower urinary tract symptoms: Secondary | ICD-10-CM | POA: Diagnosis not present

## 2023-01-25 DIAGNOSIS — N3281 Overactive bladder: Secondary | ICD-10-CM

## 2023-04-25 NOTE — Progress Notes (Deleted)
11/02/2018  3:12 PM   Todd [name] 07/16/1966 [id number]  Referring provider: Preston Fleeting, MD [address] [place],  [place] [address]  Urological history: 1. IC -incontinence, urgency, suprapubic pressure, intermittent burning symptoms for greater than 30 years -told that he had a "thinning of the bladder" after extensive evaluation in [place], [place] at age 57.  He states he was taken to the OR and had what sounds like a cystoscopy with fulguration and hydro distention, also a test done in the office where they put in a solution and it burned which sounds like a potassium sensitivity test and also a solution that was placed in his bladder several times which sounds like rescues solutions.  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  Failed Myrbetriq (Dizzinness) and Toviaz (urinary retention) -admits to depression, anxiety and PTSD -oxybutynin XL 15 mg daily   2. BPH with LU TS -PSA (10/2022) 0.6  3. Ejaculatory disorder -unable to achieve ejaculation at times  No chief complaint on file.  HPI: Todd [name] is a 57 y.o. male who presents today for three months follow up.  At his visit on November 09, 2022, he was having issues with frequency, urgency, nocturia, straining as hesitancy.  He also admitted he had not been sleep with a CPAP machine.  He was also starting to note issues with memory.  PVR 184 mL.  We asked him to discontinue the oxybutynin and start tadalafil 5 mg daily in its place.  Unfortunately, his insurance would not cover the medication and he could not afford to pay for it out-of-pocket with good Rx coupon.  We then sent a prescription over for alfuzosin 10 mg daily.  I PSS 24/6.  PVR 92 mL.    At his visit on 12/15/2022, he did not start the alfuzosin because he didn't like what he read about the side effects.  He does not want to take medications for his urinary issues.  He is wondering why he has never been offered UroLift.  Patient denies any modifying  or aggravating factors.  Patient denies any gross hematuria, dysuria or suprapubic/flank pain.  Patient denies any fevers, chills, nausea or vomiting.  He is also frustrated with his various areas of pain such as his leg, shoulder, back and bladder.  He feels that when he sees providers all they want to do is give him medication and he does not want to take medication.  I referred him for UDS and gave him a list of local providers that specialize in cognitive behavioral therapy.  At his visit on 01/11/2023, he is having pain in his testicles and lower back pain.  He had discontinued the oxybutynin at the March appointment and he also had decreased his water intake, because he was just tired of drinking water.  Last Thursday, he noted a strong smell to his urine.  He is also had an increase in his suprapubic pain, frequency and nocturia.  He then was helping his father with yard work over the weekend and developed bilateral groin pain and lower back pain.  UA is yellow clear, 3+ glucose, specific gravity 1.010, pH 5.0, 0-5 WBCs, no RBCs, 0-10 epithelial cells and no renal epithelial cells.  KUB 3 mm stone in the left kidney region. Moderate to large stool burden. He canceled his urodynamics appointment, but I encouraged him to reschedule it.  He also is seeing a local therapist.  UDS completed on 01/18/2023 revealed a max capacity ~ 362  cc, first sensation occurred ~ 290 cc and normal desire occurred ~ 297 cc and a strong desire occurred at ~ 329 cc.  Bladder was stable.  He had no leakage.  He was able to generate a voluntary contraction and voided. His voided volume ~ 321 cc with max flow was 5 mL/s.  Detrussor pressure at peak flow was 57 cmH20.  Max detrusor presusure was 75 cm H20.  PVR ~ 40 cc.  Intermittent increase in EMG noted during voiding.  Some elevation at the bladder base was noted and no reflux seen.    At his visit on 01/25/2023, he restarted his Uroxatrol recently and he feels that it has  given him great symptom relief.  Patient denies any modifying or aggravating factors.  Patient denies any gross hematuria, dysuria or suprapubic/flank pain.  Patient denies any fevers, chills, nausea or vomiting.   We discussed undergoing a cystoscopy for further evaluation of BOO, but he deferred asking to give the Uroxatrol  more time.   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  Migraines 2-3x/yr.   HTN (hypertension) 05/19/2016   Hypertension    MDD (major depressive disorder), recurrent episode (HCC) 05/28/2016   Neck pain, bilateral 02/15/2016   Non-intractable vomiting with nausea 08/26/2017   Primary osteoarthritis of right knee 01/31/2016   and fingers   PTSD (post-traumatic stress disorder) 06/05/2016   Severe recurrent major depression without psychotic features (HCC) 05/19/2016   Sleep apnea    unable to tolerate CPAP   Substance induced mood disorder (HCC) 05/19/2016   Suicidal ideation 05/19/2016   Type 2 diabetes mellitus with complication, without long-term current use of insulin (HCC) 06/08/2017   Uncontrolled type 2 diabetes mellitus with hyperglycemia, without long-term current use of insulin (HCC) 03/12/2017   Vertigo     Surgical History: Past Surgical History:  Procedure Laterality Date   BACK SURGERY  11/209/18 and 10/08/17   x 2   CIRCUMCISION N/A 07/25/2020   Procedure: CIRCUMCISION ADULT;  Surgeon: [name], MD;  Location: [facility] ORS;  Service: Urology;  Laterality: N/A;   COLONOSCOPY WITH PROPOFOL N/A 01/31/2018    Procedure: COLONOSCOPY WITH PROPOFOL;  Surgeon: Scot Jun, MD;  Location: [facility];  Service: Endoscopy;  Laterality: N/A;   ESOPHAGOGASTRODUODENOSCOPY (EGD) WITH PROPOFOL N/A 01/31/2018   Procedure: ESOPHAGOGASTRODUODENOSCOPY (EGD) WITH PROPOFOL;  Surgeon: Scot Jun, MD;  Location: [facility] ENDOSCOPY;  Service: Endoscopy;  Laterality: N/A;   LEG AMPUTATION BELOW KNEE     left   NASAL SEPTOPLASTY W/ TURBINOPLASTY Bilateral 04/17/2020   Procedure: NASAL SEPTOPLASTY WITH INFERIORTURBINATE REDUCTION;  Surgeon: [name], MD;  Location: [facility];  Service: ENT;  Laterality: Bilateral;  Diabetic - oral meds   SHOULDER SURGERY Left    SHOULDER SURGERY Right     Home Medications:  Allergies as of 04/26/2023       Reactions   Bactrim [sulfamethoxazole-trimethoprim] Itching, Other (See Comments)   Causes blisters   Myrbetriq [  mirabegron] Other (See Comments)   Dizzy, headache   Benadryl [diphenhydramine] Anxiety, Other (See Comments)   Causes "jerking"   Flonase [fluticasone] Palpitations   Tamsulosin Rash        Medication List        Accurate as of April 25, 2023  3:12 PM. If you have any questions, ask your nurse or doctor.          alfuzosin 10 MG 24 hr tablet Commonly known as: UROXATRAL TAKE 1 TABLET(10 MG) BY MOUTH DAILY WITH BREAKFAST   atorvastatin 40 MG tablet Commonly known as: LIPITOR Take 40 mg by mouth daily.   cloNIDine 0.2 MG tablet Commonly known as: CATAPRES Take 0.2 mg by mouth 2 (two) times daily.   Farxiga 10 MG Tabs tablet Generic drug: dapagliflozin propanediol Take by mouth daily.   hydrochlorothiazide 25 MG tablet Commonly known as: HYDRODIURIL Take 25 mg by mouth daily.   ibuprofen 200 MG tablet Commonly known as: ADVIL Take 400 mg by mouth every 6 (six) hours as needed for headache or moderate pain.   lisinopril 40 MG tablet Commonly known as: ZESTRIL Take 1 tablet (40 mg total) by mouth daily.    metoCLOPramide 10 MG tablet Commonly known as: REGLAN Take 10 mg by mouth 3 (three) times daily as needed for nausea or vomiting.   OMEGA-3 EPA FISH OIL PO Take by mouth.   omeprazole 40 MG capsule Commonly known as: PRILOSEC Take 40 mg by mouth 2 (two) times daily.   sitaGLIPtin 100 MG tablet Commonly known as: JANUVIA Take 100 mg by mouth daily.   tadalafil 5 MG tablet Commonly known as: CIALIS Take 1 tablet (5 mg total) by mouth daily as needed for erectile dysfunction.        Allergies:  Allergies  Allergen Reactions   Bactrim [Sulfamethoxazole-Trimethoprim] Itching and Other (See Comments)    Causes blisters   Myrbetriq [Mirabegron] Other (See Comments)    Dizzy, headache   Benadryl [Diphenhydramine] Anxiety and Other (See Comments)    Causes "jerking"   Flonase [Fluticasone] Palpitations   Tamsulosin Rash    Family History: Family History  Problem Relation Age of Onset   Diabetes Mother     Social History:  reports that he quit smoking about 34 years ago. His smoking use included cigarettes. He started smoking about 45 years ago. He has a 11 pack-year smoking history. He has never used smokeless tobacco. He reports that he does not drink alcohol and does not use drugs.  ROS: For pertinent review of systems please refer to history of present illness  Physical Exam: There were no vitals taken for this visit.  Constitutional:  Well nourished. Alert and oriented, No acute distress. HEENT: [place] AT, moist mucus membranes.  Trachea midline Cardiovascular: No clubbing, cyanosis, or edema. Respiratory: Normal respiratory effort, no increased work of breathing. GU: No CVA tenderness.  No bladder fullness or masses.  Patient with circumcised/uncircumcised phallus. ***Foreskin easily retracted***  Urethral meatus is patent.  No penile discharge. No penile lesions or rashes. Scrotum without lesions, cysts, rashes and/or edema.  Testicles are located scrotally  bilaterally. No masses are appreciated in the testicles. Left and right epididymis are normal. Rectal: Patient with  normal sphincter tone. Anus and perineum without scarring or rashes. No rectal masses are appreciated. Prostate is approximately *** grams, *** nodules are appreciated. Seminal vesicles are normal. Neurologic: Grossly intact, no focal deficits, moving all 4 extremities. Psychiatric: Normal mood and affect.   Laboratory  Data: Hemoglobin A1C Order: [id number] Component Ref Range & Units 3 wk ago  Hemoglobin A1C 4.2 - 5.6 % 7.0 High   Average Blood Glucose (Calc) mg/dL 409  Resulting Agency [facility]  Narrative Performed by [facility] Normal Range:    4.2 - 5.6% Increased Risk:  5.7 - 6.4% Diabetes:        >= 6.5% Glycemic Control for adults with diabetes:  <7%    Specimen Collected: 04/01/23 07:55   Performed by: [facility] Last Resulted: 04/01/23 09:10  Received From: [facility]  Result Received: 04/25/23 15:12   ontains abnormal data Basic Metabolic Panel (BMP) Order: [id number] Component Ref Range & Units 3 wk ago  Glucose 70 - 110 mg/dL 956 High   Sodium 213 - 145 mmol/L 138  Potassium 3.6 - 5.1 mmol/L 3.8  Chloride 97 - 109 mmol/L 101  Carbon Dioxide (CO2) 22.0 - 32.0 mmol/L 26.7  Calcium 8.7 - 10.3 mg/dL 9.6  Urea Nitrogen (BUN) 7 - 25 mg/dL 17  Creatinine 0.7 - 1.3 mg/dL 1.0  Glomerular Filtration Rate (eGFR) >60 mL/min/1.73sq m 87  Comment: CKD-EPI (2021) does not include patient's race in the calculation of eGFR.  Monitoring changes of plasma creatinine and eGFR over time is useful for monitoring kidney function.  Interpretive Ranges for eGFR (CKD-EPI 2021):  eGFR:       >60 mL/min/1.73 sq. m - Normal eGFR:       30-59 mL/min/1.73 sq. m - Moderately Decreased eGFR:       15-29 mL/min/1.73 sq. m  - Severely Decreased eGFR:       < 15 mL/min/1.73 sq. m  - Kidney  Failure   Note: These eGFR calculations do not apply in acute situations when eGFR is changing rapidly or patients on dialysis.  BUN/Crea Ratio 6.0 - 20.0 17.0  Anion Gap w/K 6.0 - 16.0 14.1  Resulting Agency [facility]   Specimen Collected: 04/01/23 07:55   Performed by: [facility] Last Resulted: 04/01/23 10:50  Received From: [facility]  Result Received: 04/25/23 15:12   Lipid Panel w/calc LDL Order: [id number] Component Ref Range & Units 3 wk ago  Cholesterol, Total 100 - 200 mg/dL 629  Triglyceride 35 - 199 mg/dL 528 High   HDL (High Density Lipoprotein) Cholesterol 29.0 - 71.0 mg/dL 41.3  LDL Calculated 0 - 130 mg/dL 36  VLDL Cholesterol mg/dL 76  Cholesterol/HDL Ratio 4.7  Resulting Agency [facility]   Specimen Collected: 04/01/23 07:55   Performed by: [facility] Last Resulted: 04/01/23 10:50  Received From: [facility]  Result Received: 04/25/23 15:12  I have reviewed the labs.    Pertinent Imaging: N/A   Assessment & Plan:    1. BPH with LU TS -worsening LU TS despite the use of OAB and BPH medications -explained that the UDS demonstrating possible detrusor under activity which may be the result of BPH, urethral stricture disease, medications or neurological disorders  -will schedule cysto to rule out BPH and/or stricture disease -Explained that the cystoscopy is a safe and common diagnostic test performed by one of our physicians in the office.  It consist of using a thin, lighted tube to look directly inside the bladder, prostate and urethra to evaluate the anatomy.  The procedure is brief, typically taking about 5 minutes. -This will unable [place] to  assess bladder health, diagnose and enlarged prostate, assess which BPH procedure may be most appropriate and rule out other bladder conditions (stricture disease, stones, cancer, etc.)  -Advised the patient that  there are no restrictions to eating or drinking prior to the cystoscopy -They can continue to take all of their medications as prescribed -They can drive themselves to and from the appointment -I explained that during the procedure, the area around the urethra will be cleansed thoroughly, topical anesthetic will be applied to numb your urethra, the thin tube is then gently inserted through the urethra into your bladder while fluid flows through the tube to the bladder to enable better visualization -I explained the procedure is usually not painful, however there may be some discomfort (pinching feeling), and they may feel an urge to urinate, coolness or fullness in the bladder and then the cystoscope is removed -After the cystoscopy, I advised them that they may experience urinary frequency, and infection, hematuria, dysuria which will resolve within 24 to 48 hours -Reviewed red flag signs (fever, bright red blood or blood clots in the urine, abdominal pain or difficulty urinating) and to contact the office immediately or seek treatment in the ED if they should experience any of these -The physician will discuss the results of the cystoscopy at the time of the procedure  -He would like to hold on cystoscopy at this time and give the Uroxatrol 3 more months  2. OAB -Continue Uroxatrol 10 mg daily for 3 months  No follow-ups on file.  Michiel Cowboy, PA-C  [facility] [place]  [address], [address] [place], [place] [address] [phone]

## 2023-04-26 ENCOUNTER — Ambulatory Visit: Payer: 59 | Admitting: Urology

## 2023-04-26 DIAGNOSIS — N138 Other obstructive and reflux uropathy: Secondary | ICD-10-CM

## 2023-04-26 DIAGNOSIS — N3281 Overactive bladder: Secondary | ICD-10-CM

## 2023-06-25 ENCOUNTER — Ambulatory Visit (INDEPENDENT_AMBULATORY_CARE_PROVIDER_SITE_OTHER): Payer: 59 | Admitting: Nurse Practitioner

## 2023-06-25 ENCOUNTER — Encounter: Payer: Self-pay | Admitting: Nurse Practitioner

## 2023-06-25 VITALS — BP 126/86 | HR 78 | Temp 98.0°F | Ht 68.0 in | Wt 259.8 lb

## 2023-06-25 DIAGNOSIS — E669 Obesity, unspecified: Secondary | ICD-10-CM | POA: Insufficient documentation

## 2023-06-25 DIAGNOSIS — G4733 Obstructive sleep apnea (adult) (pediatric): Secondary | ICD-10-CM

## 2023-06-25 DIAGNOSIS — E6609 Other obesity due to excess calories: Secondary | ICD-10-CM

## 2023-06-25 DIAGNOSIS — Z6839 Body mass index (BMI) 39.0-39.9, adult: Secondary | ICD-10-CM

## 2023-06-25 NOTE — Progress Notes (Signed)
@Patient  ID: Todd [name], male    DOB: 06/12/1966, 57 y.o.   MRN: [id number]  Chief Complaint  Patient presents with   Consult    Excessive tiredness. Restless sleep. Wakes up gasping for breath.     Referring provider: Dudley Major, FNP  HPI: 57 year old male, former smoker referred for sleep consult.  Past medical history significant for hypertension, GERD, hepatic steatosis, DM, depression with history of SI, anxiety, left AKA, obesity.  TEST/EVENTS:  06/2019 PSG: AHI 54.7/h   06/25/2023: Today-sleep consult Patient presents today for sleep consult, referred by Creed Copper, NP.  He has a history of severe sleep apnea with intolerance to CPAP.  He has tried multiple different masks and never been able to find anything that he could tolerate.  Has some difficulties with PTSD and struggles with anything on his face.  He did have a titration study but was still having difficulties afterwards that he has been off CPAP therapy for quite some time now.  He feels exhausted all the time.  Never dozes off during the day for the most part.  Feels like his symptoms of gotten worse over the last few years.  Not sleeping well at night.  Typically wakes up after a couple hours and is unable to go back to sleep.  He has been up since 3 AM this morning.  Does snore loudly at night.  Has been told that he stops breathing.  Has headaches all the time.  Denies any drowsy driving or sleep parasomnia/paralysis. Goes to bed between 1030 to 11 PM.  Typically falls asleep relatively quickly but wakes numerous times at night.  If he is able to go back to sleep, usually does not last very long.  Wake time varies from day-to-day.  Not currently working.  He does still drive a motor vehicle.  Weight has fluctuated over the last 2 years.  He had gotten down to 230 pounds from 280 pounds but is back up to 259 lb.  He has not been able to exercise quite as much as he is having difficulties with his right shoulder.  Seeing an  orthopedist for this.  Last sleep study was in 2020.  Not currently using any CPAP or oxygen. He has a history of diabetes and hypertension, controlled on medications.  No history of stroke.  He is a former smoker.  Quit smoking 37 years ago.  He does not drink any alcohol.  Quit 23 years ago.  No sleep aids.  No excessive caffeine intake.  Lives with his wife.  Family history of heart disease and cancer.  Epworth 2  Allergies  Allergen Reactions   Bactrim [Sulfamethoxazole-Trimethoprim] Itching and Other (See Comments)    Causes blisters   Myrbetriq [Mirabegron] Other (See Comments)    Dizzy, headache   Prednisone Other (See Comments)    Tachycardia not tolerable   Benadryl [Diphenhydramine] Anxiety and Other (See Comments)    Causes "jerking"   Flonase [Fluticasone] Palpitations   Tamsulosin Rash    Immunization History  Administered Date(s) Administered   Influenza-Unspecified 08/12/2017, 08/12/2022   Pneumococcal Polysaccharide-23 09/11/2017   Tdap 02/26/2022    Past Medical History:  Diagnosis Date   Anxiety    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  Migraines 2-3x/yr.   HTN (hypertension) 05/19/2016   Hypertension    MDD (major depressive disorder), recurrent episode (HCC) 05/28/2016   Neck pain, bilateral 02/15/2016   Non-intractable vomiting with nausea 08/26/2017   Primary osteoarthritis of right knee 01/31/2016   and fingers   PTSD (post-traumatic stress disorder) 06/05/2016   Severe recurrent major depression without psychotic features (HCC) 05/19/2016   Sleep apnea    unable to tolerate CPAP   Substance induced mood disorder (HCC) 05/19/2016    Suicidal ideation 05/19/2016   Type 2 diabetes mellitus with complication, without long-term current use of insulin (HCC) 06/08/2017   Uncontrolled type 2 diabetes mellitus with hyperglycemia, without long-term current use of insulin (HCC) 03/12/2017   Vertigo     Tobacco History: Social History   Tobacco Use  Smoking Status Former   Current packs/day: 0.00   Average packs/day: 1 pack/day for 11.0 years (11.0 ttl pk-yrs)   Types: Cigarettes   Start date: 10/12/1977   Quit date: 10/12/1988   Years since quitting: 34.7  Smokeless Tobacco Never   Counseling given: Not Answered   Outpatient Medications Prior to Visit  Medication Sig Dispense Refill   alfuzosin (UROXATRAL) 10 MG 24 hr tablet TAKE 1 TABLET(10 MG) BY MOUTH DAILY WITH BREAKFAST 90 tablet 1   atorvastatin (LIPITOR) 40 MG tablet Take 40 mg by mouth daily.     cloNIDine (CATAPRES) 0.2 MG tablet Take 0.2 mg by mouth 2 (two) times daily.  0   dapagliflozin propanediol (FARXIGA) 10 MG TABS tablet Take by mouth daily.     hydrochlorothiazide (HYDRODIURIL) 25 MG tablet Take 25 mg by mouth daily.     ibuprofen (ADVIL) 200 MG tablet Take 400 mg by mouth every 6 (six) hours as needed for headache or moderate pain.     lisinopril (PRINIVIL,ZESTRIL) 40 MG tablet Take 1 tablet (40 mg total) by mouth daily. 30 tablet 0   metoCLOPramide (REGLAN) 10 MG tablet Take 10 mg by mouth 3 (three) times daily as needed for nausea or vomiting.     Omega-3 Fatty Acids (OMEGA-3 EPA FISH OIL PO) Take by mouth.     omeprazole (PRILOSEC) 40 MG capsule Take 40 mg by mouth 2 (two) times daily.     sitaGLIPtin (JANUVIA) 100 MG tablet Take 100 mg by mouth daily.     tadalafil (CIALIS) 5 MG tablet Take 1 tablet (5 mg total) by mouth daily as needed for erectile dysfunction. 30 tablet 11   No facility-administered medications prior to visit.     Review of Systems:   Constitutional: No night sweats, fevers, chills, or lassitude. +weight fluctuation, fatigue   HEENT: No difficulty swallowing, tooth/dental problems, or sore throat. No sneezing, itching, ear ache, nasal congestion, or post nasal drip. +headaches  CV:  No chest pain, orthopnea, PND, swelling in lower extremities, anasarca, dizziness, palpitations, syncope Resp: +snoring, witnessed apneas. No shortness of breath with exertion or at rest. No excess mucus or change in color of mucus. No productive or non-productive. No hemoptysis. No wheezing.  No chest wall deformity GI:  +constipation, abd pain (Working with GI). No heartburn, indigestion, nausea, vomiting, diarrhea, loss of appetite, bloody stools.  GU: No dysuria, change in color of urine, urgency or frequency.  No flank pain, no hematuria  Skin: No rash, lesions, ulcerations MSK:  +right shoulder pain  Neuro: No dizziness  or lightheadedness.  Psych: +depression, anxiety (stable). No SI/HI. Mood stable. +sleep disturbance    Physical Exam:  BP 126/86 (BP Location: Right Arm, Patient Position: Sitting, Cuff Size: Large)   Pulse 78   Temp 98 F (36.7 C) (Oral)   Ht 5\' 8"  (1.727 m)   Wt 259 lb 12.8 oz (117.8 kg)   SpO2 97%   BMI 39.50 kg/m   GEN: Pleasant, interactive, well-appearing; obese; in no acute distress. HEENT:  Normocephalic and atraumatic. PERRLA. Sclera white. Nasal turbinates pink, moist and patent bilaterally. No rhinorrhea present. Oropharynx pink and moist, without exudate or edema. No lesions, ulcerations, or postnasal drip. Mallampati IV NECK:  Supple w/ fair ROM. No JVD present. Normal carotid impulses w/o bruits. Thyroid symmetrical with no goiter or nodules palpated. No lymphadenopathy.   CV: RRR, no m/r/g, no peripheral edema. Pulses intact, +2 bilaterally. No cyanosis, pallor or clubbing. PULMONARY:  Unlabored, regular breathing. Clear bilaterally A&P w/o wheezes/rales/rhonchi. No accessory muscle use.  GI: BS present and normoactive. Soft, non-tender to palpation. No organomegaly or masses detected.  MSK:  No erythema, warmth or tenderness. Cap refil <2 sec all extrem. Neuro: A/Ox3. No focal deficits noted.   Skin: Warm, no lesions or rashe Psych: Normal affect and behavior. Judgement and thought content appropriate.     Lab Results:  CBC    Component Value Date/Time   WBC 7.5 03/15/2022 1146   RBC 6.03 (H) 03/15/2022 1146   HGB 16.4 03/15/2022 1146   HCT 49.6 03/15/2022 1146   PLT 239 03/15/2022 1146   MCV 82.3 03/15/2022 1146   MCH 27.2 03/15/2022 1146   MCHC 33.1 03/15/2022 1146   RDW 13.0 03/15/2022 1146   LYMPHSABS 2.5 06/10/2018 1858   MONOABS 0.6 06/10/2018 1858   EOSABS 0.3 06/10/2018 1858   BASOSABS 0.1 06/10/2018 1858    BMET    Component Value Date/Time   NA 141 09/16/2022 0826   K 3.3 (L) 09/16/2022 0826   CL 104 09/16/2022 0826   CO2 29 09/16/2022 0826   GLUCOSE 136 (H) 09/16/2022 0826   BUN 17 09/16/2022 0826   CREATININE 1.02 09/16/2022 0826   CALCIUM 8.8 (L) 09/16/2022 0826   GFRNONAA >60 09/16/2022 0826   GFRAA >60 06/10/2020 1516    BNP No results found for: "BNP"   Imaging:  No results found.  Administration History     None           No data to display          No results found for: "NITRICOXIDE"      Assessment & Plan:   Severe obstructive sleep apnea Severe obstructive sleep apnea, intolerant of CPAP.  Significant daytime symptoms and poor sleep quality.  Last LEEP study was in 2020.  Discussed potential options for management of severe sleep apnea including retrying CPAP or hypoglossal nerve stimulator.  He has already tried numerous different masks and titration study without much success on PAP therapy.  Would like to move forward with inspire device.  Will obtain repeat sleep study to reassess his sleep apnea.  If he is determined to still be appropriate for inspire placement, we will set him up for DISE with Dr. Wynona Neat or Dr. Vassie Loll and then send referral for placement with ENT.   He has snoring, excessive daytime  sleepiness, nocturnal apneic events, headaches, restless sleep. BMI 39.   - discussed how weight can impact sleep and risk for sleep disordered breathing - discussed options to assist  with weight loss: combination of diet modification, cardiovascular and strength training exercises   - had an extensive discussion regarding the adverse health consequences related to untreated sleep disordered breathing - specifically discussed the risks for hypertension, coronary artery disease, cardiac dysrhythmias, cerebrovascular disease, and diabetes - lifestyle modification discussed   - discussed how sleep disruption can increase risk of accidents, particularly when driving - safe driving practices were discussed  Patient Instructions  Given your history and symptoms, you will need a repeat sleep study to reassess the severity of your sleep apnea. After this, we will begin the process for Inspire, if appropriate, since you have had difficulties tolerating CPAP. Please use caution when driving and pull over if you become sleepy  We discussed how untreated sleep apnea puts an individual at risk for cardiac arrhthymias, pulm HTN, DM, stroke and increases their risk for daytime accidents. We also briefly reviewed treatment options including weight loss, side sleeping position, oral appliance, CPAP therapy or referral to ENT for possible surgical options  I will call you after your sleep study to review and discuss next steps, which will include a drug induced sleep endoscopy to evaluate your airway    Class 2 obesity with body mass index (BMI) of 39.0 to 39.9 in adult BMI 39. Healthy weight loss encouraged.    I spent 45 minutes of dedicated to the care of this patient on the date of this encounter to include pre-visit review of records, face-to-face time with the patient discussing conditions above, post visit ordering of testing, clinical documentation with the electronic health record, making appropriate  referrals as documented, and communicating necessary findings to members of the patients care team.  Noemi Chapel, NP 06/25/2023  Pt aware and understands NP's role.

## 2023-06-25 NOTE — Assessment & Plan Note (Signed)
BMI 39. Healthy weight loss encouraged.

## 2023-06-25 NOTE — Assessment & Plan Note (Signed)
Severe obstructive sleep apnea, intolerant of CPAP.  Significant daytime symptoms and poor sleep quality.  Last LEEP study was in 2020.  Discussed potential options for management of severe sleep apnea including retrying CPAP or hypoglossal nerve stimulator.  He has already tried numerous different masks and titration study without much success on PAP therapy.  Would like to move forward with inspire device.  Will obtain repeat sleep study to reassess his sleep apnea.  If he is determined to still be appropriate for inspire placement, we will set him up for DISE with Dr. Wynona Neat or Dr. Vassie Loll and then send referral for placement with ENT.   He has snoring, excessive daytime sleepiness, nocturnal apneic events, headaches, restless sleep. BMI 39.   - discussed how weight can impact sleep and risk for sleep disordered breathing - discussed options to assist with weight loss: combination of diet modification, cardiovascular and strength training exercises   - had an extensive discussion regarding the adverse health consequences related to untreated sleep disordered breathing - specifically discussed the risks for hypertension, coronary artery disease, cardiac dysrhythmias, cerebrovascular disease, and diabetes - lifestyle modification discussed   - discussed how sleep disruption can increase risk of accidents, particularly when driving - safe driving practices were discussed  Patient Instructions  Given your history and symptoms, you will need a repeat sleep study to reassess the severity of your sleep apnea. After this, we will begin the process for Inspire, if appropriate, since you have had difficulties tolerating CPAP. Please use caution when driving and pull over if you become sleepy  We discussed how untreated sleep apnea puts an individual at risk for cardiac arrhthymias, pulm HTN, DM, stroke and increases their risk for daytime accidents. We also briefly reviewed treatment options including weight  loss, side sleeping position, oral appliance, CPAP therapy or referral to ENT for possible surgical options  I will call you after your sleep study to review and discuss next steps, which will include a drug induced sleep endoscopy to evaluate your airway

## 2023-06-25 NOTE — Patient Instructions (Signed)
Given your history and symptoms, you will need a repeat sleep study to reassess the severity of your sleep apnea. After this, we will begin the process for Inspire, if appropriate, since you have had difficulties tolerating CPAP. Please use caution when driving and pull over if you become sleepy  We discussed how untreated sleep apnea puts an individual at risk for cardiac arrhthymias, pulm HTN, DM, stroke and increases their risk for daytime accidents. We also briefly reviewed treatment options including weight loss, side sleeping position, oral appliance, CPAP therapy or referral to ENT for possible surgical options  I will call you after your sleep study to review and discuss next steps, which will include a drug induced sleep endoscopy to evaluate your airway

## 2023-07-19 ENCOUNTER — Other Ambulatory Visit: Payer: Self-pay | Admitting: Urology

## 2023-07-21 ENCOUNTER — Telehealth: Payer: Self-pay | Admitting: Pulmonary Disease

## 2023-07-21 DIAGNOSIS — Z789 Other specified health status: Secondary | ICD-10-CM

## 2023-07-21 DIAGNOSIS — G4733 Obstructive sleep apnea (adult) (pediatric): Secondary | ICD-10-CM

## 2023-07-21 NOTE — Telephone Encounter (Signed)
Call patient  Sleep study result  Date of study: 07/02/2023  Impression: Suboptimal study for the diagnosis of sleep disordered breathing About 4 hours of sleep is optimal for diagnosing sleep disordered breathing from a home sleep test, only 75 minutes of sleep recorded on current study.  Recommendation:  Recommend in-lab study for optimal diagnosis of sleep disordered breathing, excluding central sleep apnea if patient is considering alternative therapies including inspire device  Clinical follow-up of symptoms  Recommend aggressive weight loss measures

## 2023-07-22 NOTE — Telephone Encounter (Signed)
Orders placed for in lab NPSG. He is wanting Inspire device. Hx of severe OSA and intolerant to CPAP. Needs updated study to ensure he is an appropriate candidate still. Please let pt know HST was insufficient for this and that's why we need the in lab. Thanks!

## 2023-08-03 ENCOUNTER — Ambulatory Visit: Payer: 59 | Attending: Otolaryngology

## 2023-08-03 DIAGNOSIS — Z789 Other specified health status: Secondary | ICD-10-CM | POA: Diagnosis not present

## 2023-08-03 DIAGNOSIS — E669 Obesity, unspecified: Secondary | ICD-10-CM | POA: Insufficient documentation

## 2023-08-03 DIAGNOSIS — R0683 Snoring: Secondary | ICD-10-CM | POA: Diagnosis present

## 2023-08-03 DIAGNOSIS — Z6839 Body mass index (BMI) 39.0-39.9, adult: Secondary | ICD-10-CM | POA: Diagnosis not present

## 2023-08-03 DIAGNOSIS — E118 Type 2 diabetes mellitus with unspecified complications: Secondary | ICD-10-CM | POA: Diagnosis not present

## 2023-08-03 DIAGNOSIS — I10 Essential (primary) hypertension: Secondary | ICD-10-CM | POA: Insufficient documentation

## 2023-08-03 DIAGNOSIS — G4733 Obstructive sleep apnea (adult) (pediatric): Secondary | ICD-10-CM | POA: Insufficient documentation

## 2023-08-09 ENCOUNTER — Telehealth (INDEPENDENT_AMBULATORY_CARE_PROVIDER_SITE_OTHER): Payer: 59 | Admitting: Pulmonary Disease

## 2023-08-09 DIAGNOSIS — G4733 Obstructive sleep apnea (adult) (pediatric): Secondary | ICD-10-CM | POA: Diagnosis not present

## 2023-08-09 NOTE — Telephone Encounter (Signed)
NPSG showed severe OSA with AHI 48/ hr & low sat of 68% His BMI is 39 so may not qualify for inspire yet - may need more weight loss Meanwhile retry CPAP - suggest titration study for mask desensitization

## 2023-08-16 ENCOUNTER — Encounter: Payer: Self-pay | Admitting: Nurse Practitioner

## 2023-08-25 ENCOUNTER — Other Ambulatory Visit: Payer: Self-pay | Admitting: Nurse Practitioner

## 2023-08-25 DIAGNOSIS — R1032 Left lower quadrant pain: Secondary | ICD-10-CM

## 2023-09-01 ENCOUNTER — Ambulatory Visit
Admission: RE | Admit: 2023-09-01 | Discharge: 2023-09-01 | Disposition: A | Discharge status: Home / Self Care | Payer: 59 | Source: Ambulatory Visit | Attending: Nurse Practitioner | Admitting: Nurse Practitioner

## 2023-09-01 DIAGNOSIS — R1032 Left lower quadrant pain: Secondary | ICD-10-CM

## 2023-09-01 LAB — POCT I-STAT CREATININE: Creatinine, Ser: 1.1 mg/dL (ref 0.61–1.24)

## 2023-09-01 MED ORDER — IOHEXOL 300 MG/ML  SOLN
100.0000 mL | Freq: Once | INTRAMUSCULAR | Status: AC | PRN
  Administered 2023-09-01: 100 mL via INTRAVENOUS

## 2023-09-08 NOTE — Telephone Encounter (Signed)
Severe OSA; intolerant of CPAP. Will refer for drug induced endoscopy with Dr. Vassie Loll if he is still wanting to move forward with Inspire. Please verify with pt. If deemed a candidate after this, can refer him to ENT. BMI would need to below 40, sometimes lower depending on insurance. Recommend working on weight loss in meantime. Thanks.

## 2023-09-13 NOTE — Telephone Encounter (Signed)
Lm x1 for patient.  

## 2023-09-14 NOTE — Telephone Encounter (Signed)
Lm x2 for pt.  Will call once more due to nature of call.

## 2023-09-20 NOTE — Telephone Encounter (Signed)
Lm x3 for patient.  Letter mailed to address on file.  Will close encounter on file.

## 2023-12-14 NOTE — Progress Notes (Unsigned)
 11/02/2018  2:32 PM   Todd [name] 1965-11-20 [id number]  Referring provider: Preston Fleeting, MD [address] [place],  [place] [address]  Urological history: 1. IC -incontinence, urgency, suprapubic pressure, intermittent burning symptoms for greater than 30 years -told that he had a "thinning of the bladder" after extensive evaluation in [place], [place] at age 58.  He states he was taken to the OR and had what sounds like a cystoscopy with fulguration and hydro distention, also a test done in the office where they put in a solution and it burned which sounds like a potassium sensitivity test and also a solution that was placed in his bladder several times which sounds like rescues solutions.  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  Failed Myrbetriq (Dizzinness) and Toviaz (urinary retention) -admits to depression, anxiety and PTSD -oxybutynin XL 15 mg daily   2. BPH with LU TS -PSA (10/2022) 0.6  3. Ejaculatory disorder -unable to achieve ejaculation at times  Chief Complaint  Patient presents with   Follow-up   HPI: Todd [name] is a 58 y.o. male who presents today for pain and not being able to urinate.  Previous records reviewed.     I PSS 22/6  PVR 60 mL   UA yellow clear, 3+ glucose, specific gravity 1.020, trace blood, pH 5.5, 0-5 WBCs, 0-2 RBCs, 0-2 epithelial cells and few bacteria.  He has been having a weak urinary stream, dribbling, and suprapubic discomfort.  He also has issues with constipation.  This is more pronounced during the night, but it does sometimes happen during the day.  He has not been taking his alfuzosin.  Patient denies any modifying or aggravating factors.  Patient denies any recent UTI's, gross hematuria, dysuria or suprapubic/flank pain.  Patient denies any fevers, chills, nausea or vomiting.     IPSS     Row Name 12/15/23 1400         International Prostate Symptom Score   How often have you had the sensation of not  emptying your bladder? More than half the time     How often have you had to urinate less than every two hours? About half the time     How often have you found you stopped and started again several times when you urinated? Less than half the time     How often have you found it difficult to postpone urination? Less than half the time     How often have you had a weak urinary stream? More than half the time     How often have you had to strain to start urination? More than half the time     How many times did you typically get up at night to urinate? 3 Times     Total IPSS Score 22       Quality of Life due to urinary symptoms   If you were to spend the rest of your life with your urinary condition just the way it is now how would you feel about that? 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  Migraines 2-3x/yr.   HTN (hypertension) 05/19/2016   Hypertension    MDD (major depressive disorder), recurrent episode (HCC) 05/28/2016   Neck pain, bilateral 02/15/2016   Non-intractable vomiting with nausea 08/26/2017   Primary osteoarthritis of right knee 01/31/2016   and fingers   PTSD (post-traumatic stress disorder) 06/05/2016   Severe recurrent major depression without psychotic features (HCC) 05/19/2016   Sleep apnea    unable to tolerate CPAP   Substance induced mood disorder (HCC) 05/19/2016   Suicidal ideation 05/19/2016   Type 2 diabetes mellitus with complication, without long-term current use of insulin (HCC) 06/08/2017    Uncontrolled type 2 diabetes mellitus with hyperglycemia, without long-term current use of insulin (HCC) 03/12/2017   Vertigo     Surgical History: Past Surgical History:  Procedure Laterality Date   BACK SURGERY  11/209/18 and 10/08/17   x 2   CIRCUMCISION N/A 07/25/2020   Procedure: CIRCUMCISION ADULT;  Surgeon: [name], MD;  Location: [facility] ORS;  Service: Urology;  Laterality: N/A;   COLONOSCOPY WITH PROPOFOL N/A 01/31/2018   Procedure: COLONOSCOPY WITH PROPOFOL;  Surgeon: Scot Jun, MD;  Location: [facility] ENDOSCOPY;  Service: Endoscopy;  Laterality: N/A;   ESOPHAGOGASTRODUODENOSCOPY (EGD) WITH PROPOFOL N/A 01/31/2018   Procedure: ESOPHAGOGASTRODUODENOSCOPY (EGD) WITH PROPOFOL;  Surgeon: Scot Jun, MD;  Location: [facility] ENDOSCOPY;  Service: Endoscopy;  Laterality: N/A;   LEG AMPUTATION BELOW KNEE     left   NASAL SEPTOPLASTY W/ TURBINOPLASTY Bilateral 04/17/2020   Procedure: NASAL SEPTOPLASTY WITH INFERIORTURBINATE REDUCTION;  Surgeon: [name], MD;  Location: [facility];  Service: ENT;  Laterality: Bilateral;  Diabetic - oral meds   SHOULDER SURGERY Left    SHOULDER SURGERY Right     Home Medications:  Allergies as of 12/15/2023       Reactions   Bactrim [sulfamethoxazole-trimethoprim] Itching, Other (See Comments)   Causes blisters   Myrbetriq [mirabegron] Other (See Comments)   Dizzy, headache   Prednisone Other (See Comments)   Tachycardia not tolerable   Benadryl [diphenhydramine] Anxiety, Other (See Comments)   Causes "jerking"   Flonase [fluticasone] Palpitations   Tamsulosin Rash        Medication List        Accurate as of December 15, 2023  2:32 PM. If you have any questions, ask your nurse or doctor.          alfuzosin 10 MG 24 hr tablet Commonly known as: UROXATRAL Take 1 tablet (10 mg total) by mouth daily with breakfast. What changed: See the new instructions.   atorvastatin 40 MG tablet Commonly known as:  LIPITOR Take 40 mg by mouth daily.   buPROPion 150 MG 24 hr tablet Commonly known as: WELLBUTRIN XL Take 150 mg by mouth 2 (two) times daily.   cloNIDine 0.2 MG tablet Commonly known as: CATAPRES Take 0.2 mg by mouth 2 (two) times daily.   Farxiga 10 MG Tabs tablet Generic drug: dapagliflozin propanediol Take by mouth daily.   hydrochlorothiazide 25 MG tablet Commonly known as: HYDRODIURIL Take 25 mg by mouth daily.   ibuprofen 200 MG tablet Commonly known as: ADVIL Take 400 mg by mouth every 6 (six) hours as needed for headache or moderate pain.   lisinopril 40 MG tablet Commonly known as: ZESTRIL Take 1 tablet (40 mg total) by  mouth daily.   metoCLOPramide 10 MG tablet Commonly known as: REGLAN Take 10 mg by mouth 3 (three) times daily as needed for nausea or vomiting.   OMEGA-3 EPA FISH OIL PO Take by mouth.   omeprazole 40 MG capsule Commonly known as: PRILOSEC Take 40 mg by mouth 2 (two) times daily.   Ozempic (0.25 or 0.5 MG/DOSE) 2 MG/3ML Sopn Generic drug: Semaglutide(0.25 or 0.5MG /DOS) Inject 0.5 mg into the skin once a week.   sitaGLIPtin 100 MG tablet Commonly known as: JANUVIA Take 100 mg by mouth daily.   tadalafil 5 MG tablet Commonly known as: CIALIS Take 1 tablet (5 mg total) by mouth daily as needed for erectile dysfunction.        Allergies:  Allergies  Allergen Reactions   Bactrim [Sulfamethoxazole-Trimethoprim] Itching and Other (See Comments)    Causes blisters   Myrbetriq [Mirabegron] Other (See Comments)    Dizzy, headache   Prednisone Other (See Comments)    Tachycardia not tolerable   Benadryl [Diphenhydramine] Anxiety and Other (See Comments)    Causes "jerking"   Flonase [Fluticasone] Palpitations   Tamsulosin Rash    Family History: Family History  Problem Relation Age of Onset   Diabetes Mother     Social History:  reports that he quit smoking about 35 years ago. His smoking use included cigarettes. He started  smoking about 46 years ago. He has a 11 pack-year smoking history. He has never used smokeless tobacco. He reports that he does not drink alcohol and does not use drugs.  ROS: For pertinent review of systems please refer to history of present illness  Physical Exam: BP 104/67   Pulse 88   Wt 228 lb (103.4 kg)   BMI 34.67 kg/m   Constitutional:  Well nourished. Alert and oriented, No acute distress. HEENT: [place] AT, moist mucus membranes.  Trachea midline Cardiovascular: No clubbing, cyanosis, or edema. Respiratory: Normal respiratory effort, no increased work of breathing. Neurologic: Grossly intact, no focal deficits, moving all 4 extremities. Psychiatric: Normal mood and affect.   Laboratory Data: Hemoglobin A1C Order: [id number] Component Ref Range & Units 2 mo ago  Hemoglobin A1C 4.2 - 5.6 % 5.9 High   Average Blood Glucose (Calc) mg/dL 409  Resulting Agency [facility] - LAB  Narrative Performed by [facility] - LAB Normal Range:    4.2 - 5.6% Increased Risk:  5.7 - 6.4% Diabetes:        >= 6.5% Glycemic Control for adults with diabetes:  <7%    Specimen Collected: 10/04/23 09:58   Performed by: [facility] Last Resulted: 10/04/23 14:39  Received From: [facility]  Result Received: 12/14/23 11:30   Basic Metabolic Panel (BMP) Order: [id number] Component Ref Range & Units 2 mo ago  Glucose 70 - 110 mg/dL 956 High   Sodium 213 - 145 mmol/L 141  Potassium 3.6 - 5.1 mmol/L 3.8  Chloride 97 - 109 mmol/L 97  Carbon Dioxide (CO2) 22.0 - 32.0 mmol/L 33.3 High   Calcium 8.7 - 10.3 mg/dL 08.6  Urea Nitrogen (BUN) 7 - 25 mg/dL 11  Creatinine 0.7 - 1.3 mg/dL 1.1  Glomerular Filtration Rate (eGFR) >60 mL/min/1.73sq m 77  Comment: CKD-EPI (2021) does not include patient's race in the calculation of eGFR.  Monitoring changes of plasma creatinine and eGFR over time is useful for monitoring kidney function.  Interpretive  Ranges for eGFR (CKD-EPI 2021):  eGFR:       >  60 mL/min/1.73 sq. m - Normal eGFR:       30-59 mL/min/1.73 sq. m - Moderately Decreased eGFR:       15-29 mL/min/1.73 sq. m  - Severely Decreased eGFR:       < 15 mL/min/1.73 sq. m  - Kidney Failure   Note: These eGFR calculations do not apply in acute situations when eGFR is changing rapidly or patients on dialysis.  BUN/Crea Ratio 6.0 - 20.0 10  Anion Gap w/K 6.0 - 16.0 14.5  Resulting Agency [facility] - LAB   Specimen Collected: 10/04/23 09:58   Performed by: [facility] Last Resulted: 10/04/23 11:50  Received From: [facility]  Result Received: 12/14/23 11:30   Urinalysis See EPIC and HPI I have reviewed the labs.  See HPI.     Pertinent Imaging:  12/15/23 14:08  Scan Result 60 ml     Assessment & Plan:    1. Dysuria -UA negative  -urine culture pending -atypical's pending  2. Feelings of incomplete bladder emptying -PVR demonstrates "bladder emptying  3. BPH with LU TS -Explained that the alfuzosin helps relax the prostate the bladder neck and therefore helping increasing the force of the urinary stream -Restart alfuzosin 10 mg daily  4. OAB -see #4  Return in about 1 month (around 01/15/2024) for I PSS/PVR .  Cloretta Ned  [facility] [address] [place], [place] [address] [phone]

## 2023-12-15 ENCOUNTER — Encounter: Payer: Self-pay | Admitting: Urology

## 2023-12-15 ENCOUNTER — Ambulatory Visit (INDEPENDENT_AMBULATORY_CARE_PROVIDER_SITE_OTHER): Admitting: Urology

## 2023-12-15 VITALS — BP 104/67 | HR 88 | Wt 228.0 lb

## 2023-12-15 DIAGNOSIS — N3281 Overactive bladder: Secondary | ICD-10-CM

## 2023-12-15 DIAGNOSIS — R3914 Feeling of incomplete bladder emptying: Secondary | ICD-10-CM | POA: Diagnosis not present

## 2023-12-15 DIAGNOSIS — N401 Enlarged prostate with lower urinary tract symptoms: Secondary | ICD-10-CM

## 2023-12-15 DIAGNOSIS — R300 Dysuria: Secondary | ICD-10-CM

## 2023-12-15 DIAGNOSIS — N138 Other obstructive and reflux uropathy: Secondary | ICD-10-CM

## 2023-12-15 LAB — URINALYSIS, COMPLETE
Bilirubin, UA: NEGATIVE
Ketones, UA: NEGATIVE
Leukocytes,UA: NEGATIVE
Nitrite, UA: NEGATIVE
Protein,UA: NEGATIVE
Specific Gravity, UA: 1.02 (ref 1.005–1.030)
Urobilinogen, Ur: 0.2 mg/dL (ref 0.2–1.0)
pH, UA: 5.5 (ref 5.0–7.5)

## 2023-12-15 LAB — BLADDER SCAN AMB NON-IMAGING: Scan Result: 60

## 2023-12-15 LAB — MICROSCOPIC EXAMINATION

## 2023-12-15 MED ORDER — ALFUZOSIN HCL ER 10 MG PO TB24: 10.0000 mg | ORAL_TABLET | Freq: Every day | ORAL | 1 refills | Status: AC

## 2023-12-18 LAB — CULTURE, URINE COMPREHENSIVE

## 2023-12-22 ENCOUNTER — Emergency Department

## 2023-12-22 ENCOUNTER — Other Ambulatory Visit: Payer: Self-pay

## 2023-12-22 ENCOUNTER — Emergency Department
Admission: EM | Admit: 2023-12-22 | Discharge: 2023-12-22 | Disposition: A | Discharge status: Home / Self Care | Attending: Emergency Medicine | Admitting: Emergency Medicine

## 2023-12-22 ENCOUNTER — Encounter: Payer: Self-pay | Admitting: Emergency Medicine

## 2023-12-22 DIAGNOSIS — R0789 Other chest pain: Secondary | ICD-10-CM | POA: Insufficient documentation

## 2023-12-22 DIAGNOSIS — E119 Type 2 diabetes mellitus without complications: Secondary | ICD-10-CM | POA: Insufficient documentation

## 2023-12-22 DIAGNOSIS — R079 Chest pain, unspecified: Secondary | ICD-10-CM

## 2023-12-22 DIAGNOSIS — I10 Essential (primary) hypertension: Secondary | ICD-10-CM | POA: Diagnosis not present

## 2023-12-22 LAB — BASIC METABOLIC PANEL
Anion gap: 13 (ref 5–15)
BUN: 13 mg/dL (ref 6–20)
CO2: 21 mmol/L — ABNORMAL LOW (ref 22–32)
Calcium: 9.2 mg/dL (ref 8.9–10.3)
Chloride: 103 mmol/L (ref 98–111)
Creatinine, Ser: 1.07 mg/dL (ref 0.61–1.24)
GFR, Estimated: >60 mL/min (ref >60)
Glucose, Bld: 164 mg/dL — ABNORMAL HIGH (ref 70–99)
Potassium: 3 mmol/L — ABNORMAL LOW (ref 3.5–5.1)
Sodium: 137 mmol/L (ref 135–145)

## 2023-12-22 LAB — CBC
HCT: 47.1 % (ref 39.0–52.0)
Hemoglobin: 16.3 g/dL (ref 13.0–17.0)
MCH: 28.2 pg (ref 26.0–34.0)
MCHC: 34.6 g/dL (ref 30.0–36.0)
MCV: 81.5 fL (ref 80.0–100.0)
Platelets: 253 10*3/uL (ref 150–400)
RBC: 5.78 MIL/uL (ref 4.22–5.81)
RDW: 13.4 % (ref 11.5–15.5)
WBC: 9.1 10*3/uL (ref 4.0–10.5)
nRBC: 0 % (ref 0.0–0.2)

## 2023-12-22 LAB — MYCOPLASMA / UREAPLASMA CULTURE
Mycoplasma hominis Culture: NEGATIVE
Ureaplasma urealyticum: NEGATIVE

## 2023-12-22 LAB — TROPONIN I (HIGH SENSITIVITY)
Troponin I (High Sensitivity): 4 ng/L (ref <18)
Troponin I (High Sensitivity): 3 ng/L (ref <18)

## 2023-12-22 NOTE — ED Triage Notes (Signed)
 Patient to ED via [facility] for left sided CP. Started approx 1.5 hours ago. States pain radiates into left arm. Hx of panic attacks. States he was riding his bike when ot started and was worse when standing up w/ nausea. Given zofran by EMS. States "everything feels heavy."

## 2023-12-22 NOTE — ED Notes (Signed)
 Pt stated he is not experiencing chest pain anymore but when he was it started in the L chest and then moved towards the central chest. It also radiated to the L arm.

## 2023-12-22 NOTE — ED Provider Notes (Signed)
 [facility] Provider Note    Event Date/Time   First MD Initiated Contact with Patient 12/22/23 1713     (approximate)   History   Chief Complaint Chest Pain   HPI  Todd [name] is a 58 y.o. male with past medical history of hypertension, diabetes, GERD, and alcohol abuse who presents to the ED complaining of chest pain.  Patient reports that he was riding his motorcycle earlier this afternoon when he began to have tightness and heaviness across the left side of his chest.  He reports numbness and tingling in his left arm as well as some difficulty breathing.  He reports feeling shaky in general, describes similar symptoms in the past with panic attacks.  He states that in the past it seemed to affect his right side and so he wanted to be checked out this evening to make sure his heart was okay.  He states that symptoms have mostly resolved by the time of my evaluation.     Physical Exam   Triage Vital Signs: ED Triage Vitals  Encounter Vitals Group     BP 12/22/23 1406 (!) 142/96     Systolic BP Percentile --      Diastolic BP Percentile --      Pulse Rate 12/22/23 1406 81     Resp 12/22/23 1406 18     Temp 12/22/23 1406 98.8 F (37.1 C)     Temp Source 12/22/23 1406 Oral     SpO2 12/22/23 1406 99 %     Weight 12/22/23 1403 222 lb (100.7 kg)     Height 12/22/23 1403 5\' 8"  (1.727 m)     Head Circumference --      Peak Flow --      Pain Score 12/22/23 1403 7     Pain Loc --      Pain Education --      Exclude from Growth Chart --     Most recent vital signs: Vitals:   12/22/23 1800 12/22/23 1900  BP: (!) 141/99 (!) 108/59  Pulse: 72 64  Resp: 16   Temp:    SpO2: 100% 100%    Constitutional: Alert and oriented. Eyes: Conjunctivae are normal. Head: Atraumatic. Nose: No congestion/rhinnorhea. Mouth/Throat: Mucous membranes are moist.  Cardiovascular: Normal rate, regular rhythm. Grossly normal heart sounds.  2+ radial pulses  bilaterally. Respiratory: Normal respiratory effort.  No retractions. Lungs CTAB. Gastrointestinal: Soft and nontender. No distention. Musculoskeletal: No lower extremity tenderness nor edema.  Neurologic:  Normal speech and language. No gross focal neurologic deficits are appreciated.    ED Results / Procedures / Treatments   Labs (all labs ordered are listed, but only abnormal results are displayed) Labs Reviewed  BASIC METABOLIC PANEL - Abnormal; Notable for the following components:      Result Value   Potassium 3.0 (*)    CO2 21 (*)    Glucose, Bld 164 (*)    All other components within normal limits  CBC  TROPONIN I (HIGH SENSITIVITY)  TROPONIN I (HIGH SENSITIVITY)     EKG  ED ECG REPORT I, Chesley Noon, the attending physician, personally viewed and interpreted this ECG.   Date: 12/22/2023  EKG Time: 14:04  Rate: 83  Rhythm: normal sinus rhythm  Axis: RAD  Intervals:none  ST&T Change: None  RADIOLOGY Chest x-ray reviewed and interpreted by me with no infiltrate, edema, or effusion.  PROCEDURES:  Critical Care performed: No  Procedures   MEDICATIONS ORDERED  IN ED: Medications - No data to display   IMPRESSION / MDM / ASSESSMENT AND PLAN / ED COURSE  I reviewed the triage vital signs and the nursing notes.                              58 y.o. male with past medical history of hypertension, diabetes, GERD, and alcohol abuse who presents to the ED complaining of episode of chest heaviness, shortness of breath, and left arm tingling that has since resolved.  Patient's presentation is most consistent with acute presentation with potential threat to life or bodily function.  Differential diagnosis includes, but is not limited to, ACS, PE, pneumonia, pneumothorax, musculoskeletal pain, GERD, anxiety.  Patient nontoxic-appearing and in no acute distress, vital signs are unremarkable.  EKG shows no evidence of arrhythmia or ischemia and 2 sets of troponin  are within normal limits.  Remainder of labs are reassuring with no significant anemia, leukocytosis, electrolyte abnormality, or AKI.  Chest x-ray is also unremarkable and with resolving symptoms I doubt pulmonary embolism.  Symptoms seem likely due to anxiety and he is appropriate for discharge home with outpatient PCP follow-up.  He was counseled to return to the ED for new or worsening symptoms, patient agrees with plan.      FINAL CLINICAL IMPRESSION(S) / ED DIAGNOSES   Final diagnoses:  Nonspecific chest pain     Rx / DC Orders   ED Discharge Orders     None        Note:  This document was prepared using Dragon voice recognition software and may include unintentional dictation errors.   Chesley Noon, MD 12/22/23 [phone]

## 2023-12-30 ENCOUNTER — Telehealth: Payer: Self-pay | Admitting: Urology

## 2023-12-30 NOTE — Telephone Encounter (Signed)
 Pt did start Uroxatral again, but started having reactions again, jitters, nervousness, hands shaking.  He said it's a shame because the medicine works wonderful.  Pt had to call ambulance and go to hospital to r/o a heart attack.

## 2023-12-31 NOTE — Telephone Encounter (Signed)
 Per pt reaction was really bad. - added to allergy He felt like he was having a heart attack.   ER - visit did not finding anything wrong with pt.  ? Panic attack.   Pt is requesting alternative.   Pls advise.

## 2024-01-04 NOTE — Telephone Encounter (Signed)
 Pt aware.   He will think about it and discuss at [place] on 4/9.

## 2024-01-10 ENCOUNTER — Ambulatory Visit: Attending: Cardiology | Admitting: Cardiology

## 2024-01-18 NOTE — Progress Notes (Unsigned)
 11/02/2018  9:25 PM   Todd [name] 1966/04/25 [id number]  Referring provider: Preston Fleeting, MD [address] [place],  [place] [address]  Urological history: 1. IC/OAB -incontinence, urgency, suprapubic pressure, intermittent burning symptoms for greater than 30 years -told that he had a "thinning of the bladder" after extensive evaluation in [place], [place] at age 58.  He states he was taken to the OR and had what sounds like a cystoscopy with fulguration and hydro distention, also a test done in the office where they put in a solution and it burned which sounds like a potassium sensitivity test and also a solution that was placed in his bladder several times which sounds like rescues solutions.  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  Failed Myrbetriq (Dizzinness) and Toviaz (urinary retention) -admits to depression, anxiety and PTSD -Myrbetriq caused dizziness and headache -failed oxybutynin XL 15 mg daily   2. BPH with LU TS -PSA (10/2022) 0.6 -prostate volume 41 cc (2024)  -UDS (01/2023) max capacity ~ 362 mLs, 1st sensation at 290 mLs, no instability noted, able to generate voluntary contraction, void 321 mLs w/ max flow of 5 mL/s, intermittent increased EMG activity noted during voiding, PVR 40 mL, some elevation of the bladder base noted and no reflux seen   3. Ejaculatory disorder -unable to achieve ejaculation at times -tadalafil 5 mg daily   Chief Complaint  Patient presents with   Follow-up   HPI: Todd [name] is a 58 y.o. male who presents today for follow up.  Previous records reviewed.     At his visit on 12/15/2023.  I PSS 22/6.  PVR 60 mL.  UA yellow clear, 3+ glucose, specific gravity 1.020, trace blood, pH 5.5, 0-5 WBCs, 0-2 RBCs, 0-2 epithelial cells and few bacteria.  Urine culture and atypical cultures negative.   He has been having a weak urinary stream, dribbling, and suprapubic discomfort.  He also has issues with constipation.  This is more  pronounced during the night, but it does sometimes happen during the day.  He has not been taking his alfuzosin.  Patient denies any modifying or aggravating factors.  Patient denies any recent UTI's, gross hematuria, dysuria or suprapubic/flank pain.  Patient denies any fevers, chills, nausea or vomiting.   He was recommended that he restart alfuzosin 10 mg daily and follow-up in 1 month.  Unfortunately, he had untoward side effects with Uroxatrol and had to discontinue the medication.  I PSS 15/0  PVR 187 mL   He put delighted on his QoL because he is tired of always putting a worse score.  He is actually really bothered by his symptoms.   His most bothersome symptom is urinary urgency and frequency.  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   It is concerned that he may always have to stop/pullover to void while driving.    Patient denies any modifying or aggravating factors.  Patient denies any recent UTI's, gross hematuria, dysuria or suprapubic/flank pain.  Patient denies any fevers, chills, nausea or vomiting.     IPSS     Row Name 01/19/24 1400         International Prostate Symptom Score   How often have you had the sensation of not emptying your bladder? Less than half the time     How often have you had to urinate less than every two  hours? More than half the time     How often have you found you stopped and started again several times when you urinated? Less than 1 in 5 times     How often have you found it difficult to postpone urination? Less than 1 in 5 times     How often have you had a weak urinary stream? About half the time     How often have you had to strain to start urination? Less than 1 in 5 times     How many times did you typically get up at night to urinate? 3 Times     Total IPSS Score 15       Quality of Life due to urinary symptoms   If you were to spend the  rest of your life with your urinary condition just the way it is now how would you feel about that? Delighted               Score:  1-7 Mild 8-19 Moderate 20-35 Severe    PMH: Past Medical History:  Diagnosis Date   Anxiety    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   Chronic left shoulder pain 02/15/2016   Depression    Diabetes mellitus without complication (HCC)    Dislocation of right thumb 06/05/2016   Diverticulitis    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  Migraines 2-3x/yr.   HTN (hypertension) 05/19/2016   Hypertension    MDD (major depressive disorder), recurrent episode (HCC) 05/28/2016   Neck pain, bilateral 02/15/2016   Non-intractable vomiting with nausea 08/26/2017   Primary osteoarthritis of right knee 01/31/2016   and fingers   PTSD (post-traumatic stress disorder) 06/05/2016   Severe recurrent major depression without psychotic features (HCC) 05/19/2016   Sleep apnea    unable to tolerate CPAP   Substance induced mood disorder (HCC) 05/19/2016   Suicidal ideation 05/19/2016   Type 2 diabetes mellitus with complication, without long-term current use of insulin (HCC) 06/08/2017   Uncontrolled type 2 diabetes mellitus with hyperglycemia, without long-term current use of insulin (HCC) 03/12/2017   Vertigo     Surgical History: Past Surgical History:  Procedure Laterality Date   BACK SURGERY  11/209/18 and 10/08/17   x 2   CIRCUMCISION N/A 07/25/2020   Procedure: CIRCUMCISION ADULT;  Surgeon: [name], MD;  Location: [facility] ORS;  Service: Urology;  Laterality: N/A;   COLONOSCOPY WITH PROPOFOL N/A 01/31/2018   Procedure: COLONOSCOPY WITH PROPOFOL;  Surgeon: Scot Jun, MD;  Location: [facility] ENDOSCOPY;  Service: Endoscopy;  Laterality: N/A;   ESOPHAGOGASTRODUODENOSCOPY (EGD) WITH PROPOFOL N/A 01/31/2018   Procedure:  ESOPHAGOGASTRODUODENOSCOPY (EGD) WITH PROPOFOL;  Surgeon: Scot Jun, MD;  Location: [facility] ENDOSCOPY;  Service: Endoscopy;  Laterality: N/A;   LEG AMPUTATION BELOW KNEE     left   NASAL SEPTOPLASTY W/ TURBINOPLASTY Bilateral 04/17/2020   Procedure: NASAL SEPTOPLASTY WITH INFERIORTURBINATE REDUCTION;  Surgeon: [name], MD;  Location: [facility];  Service: ENT;  Laterality: Bilateral;  Diabetic - oral meds   SHOULDER SURGERY Left    SHOULDER SURGERY Right     Home Medications:  Allergies as of 01/19/2024       Reactions   Bactrim [sulfamethoxazole-trimethoprim] Itching, Other (See Comments)   Causes blisters   Myrbetriq [mirabegron] Other (See Comments)   Dizzy, headache  Prednisone Other (See Comments)   Tachycardia not tolerable   Uroxatral [alfuzosin] Other (See Comments)   Jittery, chest pains   Benadryl [diphenhydramine] Anxiety, Other (See Comments)   Causes "jerking"   Flonase [fluticasone] Palpitations   Tamsulosin Rash        Medication List        Accurate as of January 19, 2024  9:25 PM. If you have any questions, ask your nurse or doctor.          alfuzosin 10 MG 24 hr tablet Commonly known as: UROXATRAL Take 1 tablet (10 mg total) by mouth daily with breakfast.   atorvastatin 40 MG tablet Commonly known as: LIPITOR Take 40 mg by mouth daily.   buPROPion 150 MG 24 hr tablet Commonly known as: WELLBUTRIN XL Take 150 mg by mouth 2 (two) times daily.   cloNIDine 0.2 MG tablet Commonly known as: CATAPRES Take 0.2 mg by mouth 2 (two) times daily.   Farxiga 10 MG Tabs tablet Generic drug: dapagliflozin propanediol Take by mouth daily.   hydrochlorothiazide 25 MG tablet Commonly known as: HYDRODIURIL Take 25 mg by mouth daily.   ibuprofen 200 MG tablet Commonly known as: ADVIL Take 400 mg by mouth every 6 (six) hours as needed for headache or moderate pain.   lisinopril 40 MG tablet Commonly known as: ZESTRIL Take 1 tablet  (40 mg total) by mouth daily.   metoCLOPramide 10 MG tablet Commonly known as: REGLAN Take 10 mg by mouth 3 (three) times daily as needed for nausea or vomiting.   OMEGA-3 EPA FISH OIL PO Take by mouth.   omeprazole 40 MG capsule Commonly known as: PRILOSEC Take 40 mg by mouth 2 (two) times daily.   Ozempic (0.25 or 0.5 MG/DOSE) 2 MG/3ML Sopn Generic drug: Semaglutide(0.25 or 0.5MG /DOS) Inject 0.5 mg into the skin once a week.   sitaGLIPtin 100 MG tablet Commonly known as: JANUVIA Take 100 mg by mouth daily.   tadalafil 5 MG tablet Commonly known as: CIALIS Take 1 tablet (5 mg total) by mouth daily as needed for erectile dysfunction.        Allergies:  Allergies  Allergen Reactions   Bactrim [Sulfamethoxazole-Trimethoprim] Itching and Other (See Comments)    Causes blisters   Myrbetriq [Mirabegron] Other (See Comments)    Dizzy, headache   Prednisone Other (See Comments)    Tachycardia not tolerable   Uroxatral [Alfuzosin] Other (See Comments)    Jittery, chest pains   Benadryl [Diphenhydramine] Anxiety and Other (See Comments)    Causes "jerking"   Flonase [Fluticasone] Palpitations   Tamsulosin Rash    Family History: Family History  Problem Relation Age of Onset   Diabetes Mother     Social History:  reports that he quit smoking about 35 years ago. His smoking use included cigarettes. He started smoking about 46 years ago. He has a 11 pack-year smoking history. He has never used smokeless tobacco. He reports that he does not drink alcohol and does not use drugs.  ROS: For pertinent review of systems please refer to history of present illness  Physical Exam: BP 127/87   Pulse 81   Ht 5\' 8"  (1.727 m)   Wt 228 lb (103.4 kg)   BMI 34.67 kg/m   Constitutional:  Well nourished. Alert and oriented, No acute distress. HEENT: [place] AT, moist mucus membranes.  Trachea midline Cardiovascular: No clubbing, cyanosis, or edema. Respiratory: Normal respiratory  effort, no increased work of breathing. Neurologic: Grossly intact, no  focal deficits, moving all 4 extremities. Psychiatric: Normal mood and affect.   Laboratory Data: CBC    Component Value Date/Time   WBC 9.1 12/22/2023 1404   RBC 5.78 12/22/2023 1404   HGB 16.3 12/22/2023 1404   HCT 47.1 12/22/2023 1404   PLT 253 12/22/2023 1404   MCV 81.5 12/22/2023 1404   MCH 28.2 12/22/2023 1404   MCHC 34.6 12/22/2023 1404   RDW 13.4 12/22/2023 1404   LYMPHSABS 2.5 06/10/2018 1858   MONOABS 0.6 06/10/2018 1858   EOSABS 0.3 06/10/2018 1858   BASOSABS 0.1 06/10/2018 1858    BMET    Component Value Date/Time   NA 137 12/22/2023 1404   K 3.0 (L) 12/22/2023 1404   CL 103 12/22/2023 1404   CO2 21 (L) 12/22/2023 1404   GLUCOSE 164 (H) 12/22/2023 1404   BUN 13 12/22/2023 1404   CREATININE 1.07 12/22/2023 1404   CALCIUM 9.2 12/22/2023 1404   GFRNONAA >60 12/22/2023 1404  I have reviewed the labs.  See HPI.     Pertinent Imaging:  01/19/24 14:11  Scan Result     Assessment & Plan:    1. BPH with LU TS -failed Myrbetriq -failed oxybutynin -failed Uroxatral -tamsulosin caused a rash  -most bothersome symptom is urgency -PVR shows incomplete bladder emptying -he is ready to undergo further examination with the cystoscopy for his urinary symptoms -Explained that the cystoscopy is a safe and common diagnostic test performed by one of our physicians  in the office.  It consist of using a thin, lighted tube to look directly inside the bladder, prostate and urethra to evaluate the anatomy.  The procedure is brief, typically taking about 5 minutes. -This will enable [place] to assess bladder health, diagnose and enlarged prostate, assess which BPH procedure may be most appropriate and rule out other bladder conditions (stricture disease, stones, cancer, etc.)  -Advised the patient that there are no restrictions to eating or drinking prior to the cystoscopy -They can continue to take all of  their medications as prescribed -They can drive themselves to and from the appointment -I explained that during the procedure, the area around the urethra will be cleansed thoroughly, topical anesthetic will be applied to numb your urethra, the thin tube is then gently inserted through the urethra into your bladder while fluid flows through the tube to the bladder to enable better visualization -I explained the procedure is usually not painful, however there may be some discomfort (pinching feeling), and they may feel an urge to urinate, coolness or fullness in the bladder and then the cystoscope is removed -After the cystoscopy, I advised them that they may experience urinary frequency, hematuria, dysuria which will resolve within 24 to 48 hours -Reviewed red flag signs (fever, bright red blood or blood clots in the urine, abdominal pain or difficulty urinating) and to contact the office immediately or seek treatment in the ED if they should experience any of these -The physician will discuss the results of the cystoscopy at the time of the procedure   -return for cysto   2. OAB -see #1  Return for cysto for BOO/urgency w/ Dr. Apolinar Junes .  Cloretta Ned  [facility] [address] [place], [place] [address] [phone]

## 2024-01-19 ENCOUNTER — Encounter: Payer: Self-pay | Admitting: Urology

## 2024-01-19 ENCOUNTER — Ambulatory Visit (INDEPENDENT_AMBULATORY_CARE_PROVIDER_SITE_OTHER): Admitting: Urology

## 2024-01-19 VITALS — BP 127/87 | HR 81 | Ht 68.0 in | Wt 228.0 lb

## 2024-01-19 DIAGNOSIS — R300 Dysuria: Secondary | ICD-10-CM | POA: Diagnosis not present

## 2024-01-19 DIAGNOSIS — N138 Other obstructive and reflux uropathy: Secondary | ICD-10-CM

## 2024-01-19 DIAGNOSIS — N3281 Overactive bladder: Secondary | ICD-10-CM | POA: Diagnosis not present

## 2024-01-19 DIAGNOSIS — R3914 Feeling of incomplete bladder emptying: Secondary | ICD-10-CM

## 2024-01-19 DIAGNOSIS — N401 Enlarged prostate with lower urinary tract symptoms: Secondary | ICD-10-CM

## 2024-01-19 LAB — BLADDER SCAN AMB NON-IMAGING

## 2024-01-19 NOTE — Patient Instructions (Signed)

## 2024-01-20 ENCOUNTER — Ambulatory Visit (INDEPENDENT_AMBULATORY_CARE_PROVIDER_SITE_OTHER): Admitting: Urology

## 2024-01-20 ENCOUNTER — Other Ambulatory Visit: Payer: Self-pay

## 2024-01-20 VITALS — BP 140/97 | HR 85

## 2024-01-20 DIAGNOSIS — N3281 Overactive bladder: Secondary | ICD-10-CM | POA: Diagnosis not present

## 2024-01-20 DIAGNOSIS — Z01818 Encounter for other preprocedural examination: Secondary | ICD-10-CM

## 2024-01-20 DIAGNOSIS — N138 Other obstructive and reflux uropathy: Secondary | ICD-10-CM

## 2024-01-20 DIAGNOSIS — N401 Enlarged prostate with lower urinary tract symptoms: Secondary | ICD-10-CM | POA: Diagnosis not present

## 2024-01-20 LAB — URINALYSIS, COMPLETE
Bilirubin, UA: NEGATIVE
Ketones, UA: NEGATIVE
Leukocytes,UA: NEGATIVE
Nitrite, UA: NEGATIVE
Protein,UA: NEGATIVE
RBC, UA: NEGATIVE
Specific Gravity, UA: 1.015 (ref 1.005–1.030)
Urobilinogen, Ur: 1 mg/dL (ref 0.2–1.0)
pH, UA: 6.5 (ref 5.0–7.5)

## 2024-01-20 LAB — MICROSCOPIC EXAMINATION

## 2024-01-21 LAB — PSA: Prostate Specific Ag, Serum: 0.8 ng/mL (ref 0.0–4.0)

## 2024-01-23 LAB — CULTURE, URINE COMPREHENSIVE

## 2024-01-25 ENCOUNTER — Other Ambulatory Visit: Payer: Self-pay

## 2024-01-25 DIAGNOSIS — N3281 Overactive bladder: Secondary | ICD-10-CM

## 2024-01-25 NOTE — Progress Notes (Signed)
 Patient states that he is no longer having any symptoms. He is not interested in scheduling Botox currently. He states that he is going to call and make an appointment with Cathleen Coach when he gets off work to discuss. Removing from scheduling inbasket.

## 2024-01-25 NOTE — Progress Notes (Signed)
 Surgical Physician Order Form [facility]  Dr. Dustin Gimenez, MD  * Scheduling expectation : Next Available  *Length of Case:   *Clearance needed: no  *Anticoagulation Instructions: Hold all anticoagulants  *Aspirin Instructions: Ok to continue Aspirin  *Post-op visit Date/Instructions:  4 weeks with IPSS/ PVR Cathleen Coach  *Diagnosis: OAB  *Procedure:  Cysto with Bladder Botox Instillation   Additional orders: Botox Injection 100 units  -Admit type: OUTpatient  -Anesthesia: MAC  -VTE Prophylaxis Standing Order SCD's       Other:   -Standing Lab Orders Per Anesthesia    Lab other: None  -Standing Test orders EKG/Chest x-ray per Anesthesia       Test other:   - Medications:  Ancef 2gm IV  -Other orders:  N/A

## 2024-01-25 NOTE — Progress Notes (Signed)
   01/20/24  CC:  Chief Complaint  Patient presents with   Cysto    HPI: 58 year old male with refractory urinary symptoms who presents today for cystoscopy.  Please see Carlyle Childes notes for further details.  Specifically, he is tried and failed numerous medications.  He does have a mildly elevated PVR in the 100s.  He is most bothered by his urgency frequency the, almost has accidents at times when he cannot stop in a timely manner.   Blood pressure (!) 140/97, pulse 85. NED. A&Ox3.   No respiratory distress   Abd soft, NT, ND Normal external genitalia with patent urethral meatus  Cystoscopy Procedure Note  Patient identification was confirmed, informed consent was obtained, and patient was prepped using Betadine solution.  Lidocaine jelly was administered per urethral meatus.    Procedure: - Flexible cystoscope introduced, without any difficulty.   - Thorough search of the bladder revealed:    normal urethral meatus    normal urothelium    no stones    no ulcers     no tumors    no urethral polyps    no trabeculation  - Ureteral orifices were normal in position and appearance.  Post-Procedure: - Patient tolerated the procedure well  Assessment/ Plan:  1. OAB (overactive bladder) Refractory irritative urinary symptoms.  Bladder appears to be completely structurally normal today with a normal-appearing prostate.  No urethral issues.  Given he is tried and failed multiple medications, could consider Botox with secondary therapy for his urgency frequency symptoms.  That being said, he has a slightly higher risk than baseline for urinary retention with this medication which was asked discussed extensively today.  This is approximately 5% in all comers.  If this were to happen, he would have to self cath.  He understands this.  Additional risk include bleeding and infection.  He like to try this and see if its effective.  Will reassess postop. - Urinalysis,  Complete - CULTURE, URINE COMPREHENSIVE  2. BPH with obstruction/lower urinary tract symptoms (Primary) Prostate is not particularly large.  Suspect there also may be a component of pelvic floor dysfunction. - Urinalysis, Complete - PSA  3. Pre-op testing As above - CULTURE, URINE COMPREHENSIVE    Dustin Gimenez, MD

## 2024-03-09 ENCOUNTER — Ambulatory Visit (INDEPENDENT_AMBULATORY_CARE_PROVIDER_SITE_OTHER): Admitting: Orthopedic Surgery

## 2024-03-09 DIAGNOSIS — Z89512 Acquired absence of left leg below knee: Secondary | ICD-10-CM

## 2024-03-10 ENCOUNTER — Encounter: Payer: Self-pay | Admitting: Orthopedic Surgery

## 2024-03-10 NOTE — Progress Notes (Signed)
 Office Visit Note   Patient: Todd [name]           Date of Birth: 11/22/1965           MRN: [id number] Visit Date: 03/09/2024              Requested by: Kenton Pearl, MD 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  Assessment & Plan: Visit Diagnoses:  1. Left below-knee amputee [facility])     Plan: Patient is medically clear from an orthopedic standpoint for a commercial driver's license.  Patient is a K3 level ambulator without restrictions.  Follow-Up Instructions: Return if symptoms worsen or fail to improve.   Ortho Exam  Patient is alert, oriented, no adenopathy, well-dressed, normal affect, normal respiratory effort. Examination patient has a well-fitting prosthesis on the left.  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 Narcotics: .CHLMME  Imaging: No results found. No images are attached to the encounter.  Labs: Lab Results  Component Value Date   LABURIC 7.1 06/10/2018     Lab Results  Component Value Date   ALBUMIN 5.1 (H) 06/10/2020   ALBUMIN 4.5 06/09/2020   ALBUMIN 4.4 03/22/2019    No results found for: "MG" No results found for: "VD25OH"  No results found for: "PREALBUMIN"    Latest Ref Rng & Units 12/22/2023    2:04 PM 03/15/2022   11:46 AM 05/29/2021    1:29 PM  CBC EXTENDED  WBC 4.0 - 10.5 K/uL 9.1  7.5  7.8   RBC 4.22 - 5.81 MIL/uL 5.78  6.03  5.46   Hemoglobin 13.0 - 17.0 g/dL 62.1  30.8  65.7   HCT 39.0 - 52.0 % 47.1  49.6  43.8   Platelets 150 - 400 K/uL 253  239  235      There is no height or weight on file to calculate BMI.  Orders:   No orders of the defined types were placed in this encounter.  No orders of the defined types were placed in this encounter.    Procedures: No procedures performed  Clinical Data: No additional findings.  ROS:  All other systems negative, except as noted in the HPI. Review of Systems  Objective: Vital Signs: There were no vitals taken for this visit.  Specialty Comments:  No specialty comments available.  PMFS History: Patient Active Problem List   Diagnosis Date Noted   Severe obstructive sleep apnea 06/25/2023   Class 2 obesity with body mass index (BMI) of 39.0 to 39.9 in adult 06/25/2023   Obesity (BMI 35.0-39.9 without comorbidity) 12/14/2019   Metatarsalgia of right foot 10/30/2019   Acquired absence of extremity 10/30/2019   Closed fracture of distal end of radius 05/31/2018   Radiculopathy of lumbosacral region 09/30/2017   Spondylolisthesis at L5-S1 level 09/30/2017   Morbid obesity (HCC) 09/06/2017   Hepatic steatosis 08/26/2017   Non-intractable vomiting with nausea 08/26/2017   Upper abdominal pain 08/26/2017   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   Gastroparesis 06/08/2017   History of noncompliance with medical treatment 06/08/2017   Type 2 diabetes mellitus with  obesity (HCC) 06/08/2017   Uncontrolled type 2 diabetes mellitus with hyperglycemia (HCC) 03/12/2017   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   Dislocation of right thumb 06/05/2016   Anxiety disorder 06/05/2016   MDD (major depressive disorder), recurrent episode (HCC) 05/28/2016   Substance induced mood disorder (HCC) 05/19/2016   Severe recurrent major depression without psychotic features (HCC) 05/19/2016   Suicidal ideation 05/19/2016   Essential (primary) hypertension 05/19/2016   GERD (gastroesophageal reflux disease) 05/19/2016   Alcohol use disorder, mild, abuse 05/19/2016   Chronic left shoulder pain 02/15/2016   Neck pain, bilateral 02/15/2016   Primary osteoarthritis of right knee  01/31/2016   Past Medical History:  Diagnosis Date   Anxiety    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   Chronic left shoulder pain 02/15/2016   Depression    Diabetes mellitus without complication (HCC)    Dislocation of right thumb 06/05/2016   Diverticulitis    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  Migraines 2-3x/yr.   HTN (hypertension) 05/19/2016   Hypertension    MDD (major depressive disorder), recurrent episode (HCC) 05/28/2016   Neck pain, bilateral 02/15/2016   Non-intractable vomiting with nausea 08/26/2017   Primary osteoarthritis of right knee 01/31/2016   and fingers   PTSD (post-traumatic stress disorder) 06/05/2016   Severe recurrent major depression without psychotic features (HCC) 05/19/2016   Sleep apnea    unable to tolerate CPAP   Substance induced mood disorder (HCC) 05/19/2016   Suicidal ideation 05/19/2016   Type 2 diabetes mellitus with complication, without long-term current use of insulin (HCC) 06/08/2017   Uncontrolled type 2 diabetes mellitus with hyperglycemia, without long-term current use of insulin (HCC) 03/12/2017   Vertigo     Family History  Problem Relation Age of Onset   Diabetes Mother     Past Surgical History:  Procedure Laterality Date   BACK SURGERY  11/209/18 and 10/08/17   x 2   CIRCUMCISION N/A 07/25/2020   Procedure: CIRCUMCISION ADULT;  Surgeon: Dustin Gimenez, MD;  Location: [facility] ORS;  Service: Urology;  Laterality: N/A;   COLONOSCOPY WITH PROPOFOL  N/A 01/31/2018   Procedure: COLONOSCOPY WITH PROPOFOL ;  Surgeon: Cassie Click, MD;  Location: [facility] ENDOSCOPY;  Service: Endoscopy;  Laterality: N/A;   ESOPHAGOGASTRODUODENOSCOPY (EGD) WITH PROPOFOL  N/A 01/31/2018   Procedure: ESOPHAGOGASTRODUODENOSCOPY (EGD) WITH PROPOFOL ;  Surgeon: Cassie Click, MD;  Location: [facility] ENDOSCOPY;  Service: Endoscopy;   Laterality: N/A;   LEG AMPUTATION BELOW KNEE     left   NASAL SEPTOPLASTY W/ TURBINOPLASTY Bilateral 04/17/2020   Procedure: NASAL SEPTOPLASTY WITH INFERIORTURBINATE REDUCTION;  Surgeon: Rogers Clayman, MD;  Location: [facility];  Service: ENT;  Laterality: Bilateral;  Diabetic - oral meds   SHOULDER SURGERY Left    SHOULDER SURGERY Right    Social History   Occupational History   Not on file  Tobacco Use   Smoking status: Former    Current packs/day: 0.00    Average packs/day: 1 pack/day for 11.0 years (11.0 ttl pk-yrs)    Types: Cigarettes    Start date: 10/12/1977    Quit date: 10/12/1988    Years since quitting: 35.4   Smokeless tobacco: Never  Vaping Use   Vaping status: Never Used  Substance and Sexual Activity   Alcohol use: No   Drug use: No   Sexual activity: Not  on file

## 2024-04-07 NOTE — ED Provider Notes (Signed)
 [facility] Emergency Department Provider Note   ED Clinical Impression   Final diagnoses:  Chest pain, unspecified type (Primary)    Impression, Medical Decision Making, ED Course   Impression: 58 y.o. male with past medical history of HTN, diabetes, left BKA, GERD, alcohol use disorder, anxiety, depression, PTSD who presents with chest pain as described below.   Diagnostic workup as below.   ED Course as of 04/08/24 1023  Fri Apr 07, 2024  1945 Upon initial evaluation, patient is reporting headache, nausea, mild chest pressure. Initial vital signs are significant for hypertension. Will order CBC, CMP, PT/INR, lipase, troponin, BNP, EKG, CXR, ED cardiac POCUS.  Will order 1 L NS bolus, Tylenol , IV Zofran  and reassess.  1949 CMP demonstrates hypokalemia potassium 3.3, otherwise unremarkable.  Lipase, PT/INR, initial troponin WNL. Will order potassium supplementation.  1953 BNP WNL.  1955 CXR negative for acute abnormalities.  2030 CBC without leukocytosis, anemia, or thrombocytopenia.  2037 Bedside cardiac POCUS with normal appearing systolic function, normal RV size, no obvious pericardial effusion, unable to visualize IVC.  2151 Repeat troponin WNL.   2228 Updated patient on reassuring findings. Patient reports improvement of their symptoms. Discussed plan for discharge and follow up with PCP and his cardiologist. Patient is comfortable with this plan. Strict ED return precautions provided.     Medical Decision Making  Upon review of records, patient was last seen in the ED on 12/22/2023 for chest pain.  Workup was reassuring and patient was discharged.  Patient is also followed by orthopedic surgery as outpatient with most recent progress note occurring on 03/20/2024.  Progress note discusses back pain and plan for MRI of the lumbar spine.  Differential diagnosis includes but is not limited to ACS, PE, CHF exacerbation, pericarditis, aortic dissection, pneumothorax, pneumonia,  pancreatitis, GERD, anxiety. Physical exam is benign. Initial vital signs are significant for hypertension.   EKG demonstrates NSR, LAD, without ST elevation or depression. Lower suspicion for ACS at this time given reassuring EKG and normal troponin.  Lower suspicion for pulmonary embolism at this time given lack of tachycardia, hypoxia, tachypnea, hemoptysis, or history of DVT, PE, malignancy, recent prolonged immobilization, or hormone use. Lower suspicion for CHF exacerbation at this time given normal BNP and lack of signs of volume overload on exam. Lower suspicion for pericarditis at this time given reassuring cardiac exam and EKG without ST elevation or PR depression. Lower suspicion for aortic dissection at this time given stable vital signs, normal symmetric pulses on exam, and lack of mediastinal widening on CXR.  Lower suspicion for pneumonia or pneumothorax at this time given reassuring CXR.  Lower suspicion for pancreatitis at this time given normal lipase.    The cause of his symptoms are currently uncertain. While in the ED, patient was given IV fluids, zofran , aspirin , tylenol , and potassium supplementation with improvement of symptoms. Plan is for discharge and to follow up with PCP and his cardiologist next week. Patient given strict ED return precautions.   MDM Elements Independent Interpretation of Studies: see above I have reviewed recent and relevant previous record, including: prior progress notes (discussing PMH and management) Prescription drugs considered but not prescribed: pain medication (patient can take tylenol  or ibuprofen  at home for pain) Diagnostic tests considered but not performed: CT PE study (lower suspicion for pulmonary embolism at this time), CT dissection study (lower suspicion for aortic dissection at this time) ____________________________________________  The case was discussed with the attending physician, Dr. Billy, who is in agreement  with the above  assessment and plan.    History   Chief Complaint:  Chief Complaint  Patient presents with  . Chest Pain   HPI  Todd [name] is a 58 y.o. male with past medical history as stated below who presents with chest pain.  Patient reports an episode of central chest pain radiating into his neck and left arm that began earlier today around 14:00 while he was resting. His chest pain was associated with shortness of breath, nausea, and diaphoresis. Additionally, the patient endorses a headache in bilateral temples and posterior head with associated bilateral blurred vision over the last few days. He measured his BP at home when his symptoms first began, which was elevated to 150/81. He reports a history of panic attacks involving similar symptoms and states that he has difficulty discerning if these symptoms are cardiac in nature or due to a panic attack. Otherwise, patient has been feeling well. He denies dysuria, lower extremity pain or swelling, diarrhea, hemoptysis, emesis or back pain. Patient denies history of pulmonary embolism, DVT, malignancy, recent prolonged immobilization, or hormone use. Patient denies recent trauma, falls, or injuries. He does take ozempic.   Past Medical History[1]  Past Surgical History[2]  No current facility-administered medications for this encounter.  Current Outpatient Medications:  .  aspirin  81 MG chewable tablet, Chew 1 tablet (81 mg total) daily., Disp: 30 tablet, Rfl: 0 .  atorvastatin (LIPITOR) 40 MG tablet, Take 1 tablet (40 mg total) by mouth., Disp: , Rfl:  .  celecoxib (CELEBREX) 100 MG capsule, Take 1 capsule (100 mg total) by mouth two (2) times a day as needed for pain., Disp: 60 capsule, Rfl: 2 .  cloNIDine HCL (CATAPRES) 0.2 MG tablet, Take 1 tablet (0.2 mg total) by mouth Two (2) times a day., Disp: , Rfl:  .  doxycycline (DORYX) 100 MG EC tablet, Take 1 tablet (100 mg total) by mouth Two (2) times a day., Disp: 20 tablet, Rfl: 0 .  FARXIGA 5 mg  Tab tablet, Take 1 tablet (5 mg total) by mouth every morning., Disp: , Rfl: 4 .  fish oil-omega-3 fatty acids 300-1,000 mg capsule, Take 2 capsules (2 g total) by mouth daily., Disp: , Rfl:  .  glipiZIDE (GLUCOTROL XL) 10 MG 24 hr tablet, Take 1 tablet (10 mg total) by mouth daily., Disp: , Rfl:  .  hydroCHLOROthiazide (HYDRODIURIL) 25 MG tablet, 1 tablet (25 mg total)., Disp: , Rfl:  .  lidocaine  (XYLOCAINE ) 5 % ointment, Apply 1 application. topically Two (2) times a day., Disp: , Rfl:  .  lisinopril  (PRINIVIL ,ZESTRIL ) 40 MG tablet, Take 1 tablet (40 mg total) by mouth daily., Disp: , Rfl:  .  metoclopramide  (REGLAN ) 10 MG tablet, Take 1 tablet (10 mg total) by mouth., Disp: , Rfl:  .  multivitamin capsule, Take 1 capsule by mouth daily., Disp: , Rfl:  .  omeprazole (PRILOSEC) 20 MG capsule, Take 2 capsules (40 mg total) by mouth daily., Disp: , Rfl:  .  oxybutynin  (DITROPAN ) 5 MG tablet, Take 1 tablet (5 mg total) by mouth Two (2) times a day., Disp: , Rfl:  .  OZEMPIC 0.25 mg or 0.5 mg (2 mg/3 mL) PnIj, , Disp: , Rfl:  .  polyethylene glycol (GLYCOLAX) 17 gram/dose powder, Take 17 g by mouth two (2) times a day as needed., Disp: , Rfl:  .  SITagliptin (JANUVIA) 100 MG tablet, TAKE 1 TABLET(100 MG) BY MOUTH EVERY DAY, Disp: , Rfl:  Allergies Alfuzosin , Prednisone , Benadryl allergy decongestant, Diphenhydramine, Sulfamethoxazole -trimethoprim , and Tamsulosin   Family History Family History[3]  Social History Short Social History[4]   Physical Exam   VITAL SIGNS:    Vitals:   04/07/24 1912 04/07/24 2240  BP: 140/91 136/83  Pulse: 76 66  Resp: 18 18  Temp: 36.9 C (98.4 F) 36.4 C (97.6 F)  TempSrc: Oral Oral  SpO2: 99% 98%  Weight: 99.8 kg (220 lb)   Height: 175.3 cm (5' 9)     Vital signs reviewed.   Constitutional: Alert and oriented. No acute distress. HEENT: Normocephalic and atraumatic. Conjunctivae normal. Moist mucous membranes. No oropharyngeal erythema or edema.  Neck supple without meningismus, non-tender, no significant cervical lymphadenopathy, no masses.  Cardiovascular: Regular rate, regular rhythm. No murmurs, no rubs, no gallops. Normal and symmetric distal pulses. Brisk capillary refill.  Respiratory: Normal respiratory effort. No wheezes or crackles.  Gastrointestinal: Soft, non-distended. Non-tender. No rebound tenderness or guarding. Normal bowel sounds. No palpable organomegaly or masses. Musculoskeletal: Non-tender with normal range of motion in all extremities. No lower extremity swelling. Left BKA without signs of erythema, warmth, swelling. Neurologic: Cranial nerves grossly intact. No gross focal neurologic deficits appreciated. Moving all extremities equally. Motor and sensory function intact.  Skin: warm, dry. No rash.  Psychiatric: Mood and affect are normal. Speech and behavior are normal.   Radiology   XR Chest 2 views  Final Result    No acute cardiopulmonary abnormalities.          ED POCUS        Pertinent labs & imaging results that were available during my care of the patient were independently interpreted by me and considered in my medical decision making (see chart for details).  Portions of this record have been created using Scientist, clinical (histocompatibility and immunogenetics). Dictation errors have been sought, but may not have been identified and corrected.  Documentation assistance was provided by Lamarr Edison, Scribe, on April 07, 2024 at 7:44 PM for Tavien Mapp, MD.  April 07, 2024 8:40 PM. Documentation assistance provided by the scribe. I was present during the time the encounter was recorded. The information recorded by the scribe was done at my direction and has been reviewed and validated by me.       Mapp, Gladies LABOR, MD Resident 04/08/24 1023      [1] Past Medical History: Diagnosis Date  . Anxiety   . Arthritis   . Depression   . Diabetes 1.5, managed as type 2      . GERD (gastroesophageal reflux disease)   .  Hypertension   . Joint pain   . PTSD (post-traumatic stress disorder)   [2] Past Surgical History: Procedure Laterality Date  . KNEE SURGERY    . PR ALLOGRAFT FOR SPINE SURGERY ONLY MORSELIZED N/A 09/30/2017   Procedure: ALLOGRAFT FOR SPINE SURGERY ONLY; MORSELIZED;  Surgeon: Abram JONELLE Gartner, MD;  [facility];  Service: Ortho Spine  . PR ANTERIOR INSTRUMENTATION 2-3 VERTEBRAL SEGMENTS N/A 09/09/2017   Procedure: Ant Instrum; 2 To 3 Verteb Segmt Thoracolumbar;  Surgeon: Abram JONELLE Gartner, MD;  Location: MAIN OR [facility];  Service: Ortho Spine  . PR ARTHRODESIS ANT INTERBODY MIN DISCECTOMY,LUMBAR N/A 09/09/2017   Procedure: Arthrodesis, Anterior Interbody Technique, Include Minimal Diskectomy To Prepare Interspace; Lumbar;  Surgeon: Abram JONELLE Gartner, MD;  Location: MAIN OR [facility];  Service: Ortho Spine  . PR ARTHRODESIS ANT INTERBODY MIN DISCECTOMY,LUMBAR N/A 09/09/2017   Procedure: Arthrodesis, Anterior Interbody Technique, Include Minimal  Diskectomy To Prepare Interspace; Lumbar;  Surgeon: Jerilynn Debby Cramp, MD;  Location: MAIN OR [facility];  Service: Surgical Oncology  . PR ARTHRODESIS POSTERIOR/PSTLAT TQ 1NTRSPC LUMBAR N/A 09/30/2017   Procedure: ARTHRODESIS, POSTERIOR OR POSTEROLATERAL TECH, SINGLE LEVEL; LUMBAR(LATERAL TRANSVERSE TECHNIQUE IF DONE);  Surgeon: Abram JONELLE Gartner, MD;  Location: [facility] OR [facility];  Service: Ortho Spine  . PR INSJ BIOMCHN DEV INTERVERTEBRAL DSC SPC W/ARTHRD N/A 09/09/2017   Procedure: Insert Interbody Biomechanical Device(S) With Integral Anterior Instrument For Device Anchoring, When Performed, To Intervertebral Disc Space In Conjunction With Interbody Arthrodesis, Each Interspace;  Surgeon: Abram JONELLE Gartner, MD;  Location: MAIN OR [facility];  Service: Ortho Spine  . PR IONM 1 ON 1 IN OR W/ATTENDANCE EACH 15 MINUTES N/A 09/09/2017   Procedure: Continuous Intraoperative Neurophysiology Monitoring In Or;  Surgeon: Abram JONELLE Gartner, MD;  Location: MAIN OR [facility]  Service: Ortho Spine  . PR IONM 1 ON  1 IN OR W/ATTENDANCE EACH 15 MINUTES N/A 09/30/2017   Procedure: CONTINUOUS INTRAOPERATIVE NEUROPHYSIOLOGY MONITORING IN OR;  Surgeon: Abram JONELLE Gartner, MD;  Location: [facility] OR [facility];  Service: Ortho Spine  . PR POSTERIOR NON-SEGMENTAL INSTRUMENTATION N/A 09/30/2017   Procedure: POSTERIOR NON-SEGMENTAL INSTRUMENTATION (EG, HARRINGTON ROD TECHNIQUE, PEDICLE/SCREW/WIRE/FACET SCREW FIX);  Surgeon: Abram JONELLE Gartner, MD;  Location: [facility] OR [facility];  Service: Ortho Spine  . PR REPAIR BICEPS LONG TENDON Right 05/06/2022   Procedure: TENODESIS LONG TENDON BICEPS;  Surgeon: Lamar Marsa Hamming, MD;  [facility]  Service: Orthopedics  . PR REVISE MEDIAN N/CARPAL TUNNEL SURG Right 01/30/2022   Procedure: NEUROPLASTY AND/OR TRANSPOSITION; MEDIAN NERVE AT CARPAL TUNNEL;  Surgeon: Robynn Tanda Beaver, MD;  Location: ASC OR [facility];  Service: Orthopedics  . PR REVISE MEDIAN N/CARPAL TUNNEL SURG Left 03/03/2022   Procedure: NEUROPLASTY AND/OR TRANSPOSITION; MEDIAN NERVE AT CARPAL TUNNEL;  Surgeon: Robynn Tanda Beaver, MD;  Location: ASC OR [facility];  Service: Orthopedics  . PR SHLDR ARTHROSCOP,PART ACROMIOPLAS Left 11/18/2016   Procedure: ARTHROSCOPY, SHOULDER, SURGICAL; DECOMPRESS SUBACROMIAL SPACE W/PART ACROMIOPLASTY, ASTRID BING;  Surgeon: Reyes Pricilla Raymond, MD;  Location: ASC OR [facility];  Service: Orthopedics  . PR SHLDR ARTHROSCOP,SURG,DIS CLAVICULECTOMY Left 11/18/2016   Procedure: ARTHROSCOPY, SHOULDER, SURGICAL; DISTAL CLAVICULECTOMY INCLUDING DISTAL ARTICULAR SURFACE;  Surgeon: Reyes Pricilla Raymond, MD;  Location: ASC OR [facility];  Service: Orthopedics  . PR SHLDR ARTHROSCOP,SURG,W/ROTAT CUFF REPR Left 11/18/2016   Procedure: ARTHROSCOPY, SHOULDER, SURGICAL; WITH ROTATOR CUFF REPAIR;  Surgeon: Reyes Pricilla Raymond, MD;  Location: ASC OR [facility];  Service: Orthopedics  . SHOULDER SURGERY    [3] Family History Problem Relation Age of Onset  . Diabetes Mother   . Heart disease Mother   .  Depression Mother   [4] Social History Tobacco Use  . Smoking status: Former    Current packs/day: 0.00    Average packs/day: 0.5 packs/day for 10.0 years (5.0 ttl pk-yrs)    Types: Cigarettes    Start date: 10/12/1978    Quit date: 10/12/1988    Years since quitting: 35.5  . Smokeless tobacco: Never  Substance Use Topics  . Alcohol use: Yes    Comment: occasional, on special occaisions  . Drug use: No

## 2024-04-13 ENCOUNTER — Ambulatory Visit: Attending: Cardiology | Admitting: Cardiology

## 2024-04-13 ENCOUNTER — Encounter: Payer: Self-pay | Admitting: Cardiology

## 2024-04-13 VITALS — BP 134/93 | HR 77 | Ht 68.5 in | Wt 221.6 lb

## 2024-04-13 DIAGNOSIS — Z136 Encounter for screening for cardiovascular disorders: Secondary | ICD-10-CM | POA: Diagnosis not present

## 2024-04-13 DIAGNOSIS — R072 Precordial pain: Secondary | ICD-10-CM | POA: Diagnosis not present

## 2024-04-13 DIAGNOSIS — I10 Essential (primary) hypertension: Secondary | ICD-10-CM | POA: Diagnosis not present

## 2024-04-13 MED ORDER — METOPROLOL TARTRATE 100 MG PO TABS: ORAL_TABLET | ORAL | 0 refills | Status: DC

## 2024-04-13 NOTE — Patient Instructions (Signed)
 Medication Instructions:  - take metoprolol 2 hours prior to cardiac CT  *If you need a refill on your cardiac medications before your next appointment, please call your pharmacy*  Lab Work: Your provider would like for you to have following labs drawn today BMP.    If you have labs (blood work) drawn today and your tests are completely normal, you will receive your results only by: MyChart Message (if you have MyChart) OR A paper copy in the mail If you have any lab test that is abnormal or we need to change your treatment, we will call you to review the results.  Testing/Procedures: Your physician has requested that you have an echocardiogram. Echocardiography is a painless test that uses sound waves to create images of your heart. It provides your doctor with information about the size and shape of your heart and how well your heart's chambers and valves are working.   You may receive an ultrasound enhancing agent through an IV if needed to better visualize your heart during the echo. This procedure takes approximately one hour.  There are no restrictions for this procedure.  This will take place at [address] [facility]) [address], [place] [address]  Please note: We ask at that you not bring children with you during ultrasound (echo/ vascular) testing. Due to room size and safety concerns, children are not allowed in the ultrasound rooms during exams. Our front office staff cannot provide observation of children in our lobby area while testing is being conducted. An adult accompanying a patient to their appointment will only be allowed in the ultrasound room at the discretion of the ultrasound technician under special circumstances. We apologize for any inconvenience.     Your cardiac CT will be scheduled at:  [facility] [address] [place], [place] [address] [phone]  Please arrive 15 mins early for check-in and test prep.  There  is spacious parking and easy access to the radiology department from the [facility] entrance. Please enter here and check-in with the desk attendant.    Please follow these instructions carefully (unless otherwise directed):  An IV will be required for this test and Nitroglycerin will be given.  Hold all erectile dysfunction medications at least 3 days (72 hrs) prior to test. (Ie viagra, cialis , sildenafil, tadalafil , etc)   On the Night Before the Test: Be sure to Drink plenty of water. Do not consume any caffeinated/decaffeinated beverages or chocolate 12 hours prior to your test. Do not take any antihistamines 12 hours prior to your test.  On the Day of the Test: Drink plenty of water until 1 hour prior to the test. Do not eat any food 1 hour prior to test. You may take your regular medications prior to the test.  Take metoprolol (Lopressor) two hours prior to test. If you take Furosemide/Hydrochlorothiazide/Spironolactone/Chlorthalidone, please HOLD on the morning of the test. Patients who wear a continuous glucose monitor MUST remove the device prior to scanning. FEMALES- please wear underwire-free bra if available, avoid dresses & tight clothing  After the Test: Drink plenty of water. After receiving IV contrast, you may experience a mild flushed feeling. This is normal. On occasion, you may experience a mild rash up to 24 hours after the test. This is not dangerous. If this occurs, you can take Benadryl 25 mg, Zyrtec, Claritin, or Allegra and increase your fluid intake. (Patients taking Tikosyn should avoid Benadryl, and may take Zyrtec, Claritin, or  Allegra) If you experience trouble breathing, this can be serious. If it is severe call 911 IMMEDIATELY. If it is mild, please call our office.  We will call to schedule your test 2-4 weeks out understanding that some insurance companies will need an authorization prior to the service being performed.   For more  information and frequently asked questions, please visit our website : [email or web address]  For non-scheduling related questions, please contact the cardiac imaging nurse navigator should you have any questions/concerns: Cardiac Imaging Nurse Navigators Direct Office Dial: [phone]   For scheduling needs, including cancellations and rescheduling, please call [place], [phone].    Follow-Up: At [facility], you and your health needs are our priority.  As part of our continuing mission to provide you with exceptional heart care, our providers are all part of one team.  This team includes your primary Cardiologist (physician) and Advanced Practice Providers or APPs (Physician Assistants and Nurse Practitioners) who all work together to provide you with the care you need, when you need it.  Your next appointment:   2 month(s)  Provider:   You may see Dr. Darliss or one of the following Advanced Practice Providers on your designated Care Team:   Lonni Meager, NP Lesley Maffucci, PA-C Bernardino Bring, PA-C Cadence Holley, PA-C Tylene Lunch, NP Barnie Hila, NP    We recommend signing up for the patient portal called MyChart.  Sign up information is provided on this After Visit Summary.  MyChart is used to connect with patients for Virtual Visits (Telemedicine).  Patients are able to view lab/test results, encounter notes, upcoming appointments, etc.  Non-urgent messages can be sent to your provider as well.   To learn more about what you can do with MyChart, go to [email or web address].
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
## 2024-04-17 ENCOUNTER — Other Ambulatory Visit: Payer: Self-pay | Admitting: Cardiology

## 2024-04-17 ENCOUNTER — Ambulatory Visit: Attending: Cardiology

## 2024-04-17 DIAGNOSIS — I6523 Occlusion and stenosis of bilateral carotid arteries: Secondary | ICD-10-CM | POA: Diagnosis not present

## 2024-04-17 DIAGNOSIS — R519 Headache, unspecified: Secondary | ICD-10-CM | POA: Diagnosis not present

## 2024-04-17 DIAGNOSIS — R072 Precordial pain: Secondary | ICD-10-CM

## 2024-04-17 DIAGNOSIS — I10 Essential (primary) hypertension: Secondary | ICD-10-CM

## 2024-04-17 DIAGNOSIS — Z136 Encounter for screening for cardiovascular disorders: Secondary | ICD-10-CM

## 2024-04-18 ENCOUNTER — Ambulatory Visit: Payer: Self-pay | Admitting: Cardiology

## 2024-04-19 ENCOUNTER — Other Ambulatory Visit: Payer: Self-pay

## 2024-04-19 ENCOUNTER — Telehealth (HOSPITAL_COMMUNITY): Payer: Self-pay | Admitting: *Deleted

## 2024-04-19 ENCOUNTER — Other Ambulatory Visit: Payer: Self-pay | Admitting: *Deleted

## 2024-04-19 MED ORDER — IVABRADINE HCL 5 MG PO TABS
10.0000 mg | ORAL_TABLET | Freq: Once | ORAL | 0 refills | Status: AC
Start: 2024-04-19 — End: 2024-04-20
  Filled 2024-04-19: qty 2, 1d supply, fill #0

## 2024-04-19 NOTE — Telephone Encounter (Signed)
 Spoke with Todd [name] at 4:46p.  Returned Todd [name]'s call. Todd [name] stated that he was unable to take Metorprolol 100mg  prior to CTA; Todd [name] didn't specify specific problem he had with Metoprolol , just that he knew he couldn't take it.; added this medication to his allergies; sent a prescription for a one time dose of Ivabrodine 10 mg (2 tablets) from the [facility] pharmacy for Todd [name] to take 2 hours prior to procedure.  Gave Todd [name] directions to our out patient pharmacy.  Todd [name] verbalized understanding that he is to pick up med and take it 2 hours prior to procedure.

## 2024-04-19 NOTE — Telephone Encounter (Signed)
 Patient returned RN's call.  Patient has test scheduled for tomorrow and stated he has not got the medication to be taken 2 hours prior to the test and he is allergic to Benadryl.

## 2024-04-19 NOTE — Progress Notes (Signed)
 Pt called and stated he was allergic to Metoprolol  and that he could not take for CTA; an order for Ivabrodine (CORLANOR) 10 mg was sent to [facility]; pt states that he can pick up the med before 6pm

## 2024-04-19 NOTE — Telephone Encounter (Signed)
 Attempted to call patient regarding upcoming cardiac CT appointment. Left message on voicemail with name and callback number Johney Frame RN Navigator Cardiac Imaging [facility] [phone] Office

## 2024-04-20 ENCOUNTER — Ambulatory Visit
Admission: RE | Admit: 2024-04-20 | Discharge: 2024-04-20 | Disposition: A | Discharge status: Home / Self Care | Source: Ambulatory Visit | Attending: Cardiology | Admitting: Cardiology

## 2024-04-20 DIAGNOSIS — R072 Precordial pain: Secondary | ICD-10-CM | POA: Diagnosis present

## 2024-04-20 MED ORDER — IOHEXOL 350 MG/ML SOLN
100.0000 mL | Freq: Once | INTRAVENOUS | Status: AC | PRN
  Administered 2024-04-20: 100 mL via INTRAVENOUS

## 2024-04-20 MED ORDER — DILTIAZEM HCL 25 MG/5ML IV SOLN: 10.0000 mg | INTRAVENOUS | Status: DC | PRN

## 2024-04-20 MED ORDER — NITROGLYCERIN 0.4 MG SL SUBL
0.8000 mg | SUBLINGUAL_TABLET | Freq: Once | SUBLINGUAL | Status: AC
  Administered 2024-04-20: 0.8 mg via SUBLINGUAL

## 2024-04-22 ENCOUNTER — Ambulatory Visit: Payer: Self-pay | Admitting: Cardiology

## 2024-04-24 ENCOUNTER — Other Ambulatory Visit: Payer: Self-pay

## 2024-04-24 MED ORDER — ASPIRIN 81 MG PO TBEC: 81.0000 mg | DELAYED_RELEASE_TABLET | Freq: Every day | ORAL | Status: AC

## 2024-04-26 ENCOUNTER — Encounter (HOSPITAL_COMMUNITY)

## 2024-05-04 ENCOUNTER — Ambulatory Visit: Attending: Cardiology

## 2024-05-17 NOTE — Telephone Encounter (Signed)
 SABRA

## 2024-05-31 ENCOUNTER — Ambulatory Visit (INDEPENDENT_AMBULATORY_CARE_PROVIDER_SITE_OTHER)

## 2024-05-31 ENCOUNTER — Ambulatory Visit (INDEPENDENT_AMBULATORY_CARE_PROVIDER_SITE_OTHER): Admitting: Podiatry

## 2024-05-31 VITALS — Ht 68.5 in | Wt 221.6 lb

## 2024-05-31 DIAGNOSIS — M722 Plantar fascial fibromatosis: Secondary | ICD-10-CM

## 2024-05-31 DIAGNOSIS — M7751 Other enthesopathy of right foot: Secondary | ICD-10-CM | POA: Diagnosis not present

## 2024-05-31 NOTE — Patient Instructions (Addendum)
 VISIT SUMMARY: Today, you were seen for heel pain, which has been affecting your daily activities. We discussed your history of diabetes, blood pressure, and recent significant weight loss. Your diabetes is well controlled, and your blood pressure has been low, likely due to your weight loss. We reviewed your current symptoms and created a plan to manage your heel pain and overall health.  YOUR PLAN: -RIGHT PLANTAR FASCIITIS AND ACHILLES TENDINITIS WITH POSTERIOR HEEL SPUR AND MILD PES PLANUS: You have inflammation in the tissues of your heel and Achilles tendon, along with a small bone spur and flat feet. We recommend home exercises to stretch your calf muscle and plantar fascia, and you can use ibuprofen  as needed for pain. If the pain does not improve in 5-6 weeks, we may consider a steroid injection. Please follow up in 6 weeks to reassess your pain and treatment progress.  -TYPE 2 DIABETES MELLITUS, WELL CONTROLLED: Your diabetes is well managed with an A1c of 5.8. Continue monitoring your blood sugar levels regularly. If you experience jitteriness from low blood sugar, consider adjusting your diet or discussing further with us .  -ESSENTIAL HYPERTENSION: Your blood pressure has been low, likely due to your significant weight loss. Continue to monitor your blood pressure, and if you experience dizziness, please let us  know.  -OBESITY, IMPROVED WITH SIGNIFICANT WEIGHT LOSS: You have lost a significant amount of weight, which has positively impacted your blood pressure and diabetes control. Continue with your current diet and exercise regimen to maintain your weight loss and overall health.  INSTRUCTIONS: Please follow up in 6 weeks to reassess your heel pain and the effectiveness of the treatment plan. Continue monitoring your blood sugar and blood pressure regularly, and report any significant changes or symptoms to us .                      Contains text generated by [employer]bridge.                                  Contains text generated by Abridge.    Plantar Fasciitis (Heel Spur Syndrome) with Rehab The plantar fascia is a fibrous, ligament-like, soft-tissue structure that spans the bottom of the foot. Plantar fasciitis is a condition that causes pain in the foot due to inflammation of the tissue. SYMPTOMS  Pain and tenderness on the underneath side of the foot. Pain that worsens with standing or walking. CAUSES  Plantar fasciitis is caused by irritation and injury to the plantar fascia on the underneath side of the foot. Common mechanisms of injury include: Direct trauma to bottom of the foot. Damage to a small nerve that runs under the foot where the main fascia attaches to the heel bone. Stress placed on the plantar fascia due to bone spurs. RISK INCREASES WITH:  Activities that place stress on the plantar fascia (running, jumping, pivoting, or cutting). Poor strength and flexibility. Improperly fitted shoes. Tight calf muscles. Flat feet. Failure to warm-up properly before activity. Obesity. PREVENTION Warm up and stretch properly before activity. Allow for adequate recovery between workouts. Maintain physical fitness: Strength, flexibility, and endurance. Cardiovascular fitness. Maintain a health body weight. Avoid stress on the plantar fascia. Wear properly fitted shoes, including arch supports for individuals who have flat feet.  PROGNOSIS  If treated properly, then the symptoms of plantar fasciitis usually resolve without surgery. However, occasionally surgery is necessary.  RELATED COMPLICATIONS  Recurrent symptoms  that may result in a chronic condition. Problems of the lower back that are caused by compensating for the injury, such as limping. Pain or weakness of the foot during push-off following surgery. Chronic inflammation, scarring, and partial or complete fascia tear, occurring more often from repeated  injections.  TREATMENT  Treatment initially involves the use of ice and medication to help reduce pain and inflammation. The use of strengthening and stretching exercises may help reduce pain with activity, especially stretches of the Achilles tendon. These exercises may be performed at home or with a therapist. Your caregiver may recommend that you use heel cups of arch supports to help reduce stress on the plantar fascia. Occasionally, corticosteroid injections are given to reduce inflammation. If symptoms persist for greater than 6 months despite non-surgical (conservative), then surgery may be recommended.   MEDICATION  If pain medication is necessary, then nonsteroidal anti-inflammatory medications, such as aspirin  and ibuprofen , or other minor pain relievers, such as acetaminophen , are often recommended. Do not take pain medication within 7 days before surgery. Prescription pain relievers may be given if deemed necessary by your caregiver. Use only as directed and only as much as you need. Corticosteroid injections may be given by your caregiver. These injections should be reserved for the most serious cases, because they may only be given a certain number of times.  HEAT AND COLD Cold treatment (icing) relieves pain and reduces inflammation. Cold treatment should be applied for 10 to 15 minutes every 2 to 3 hours for inflammation and pain and immediately after any activity that aggravates your symptoms. Use ice packs or massage the area with a piece of ice (ice massage). Heat treatment may be used prior to performing the stretching and strengthening activities prescribed by your caregiver, physical therapist, or athletic trainer. Use a heat pack or soak the injury in warm water.  SEEK IMMEDIATE MEDICAL CARE IF: Treatment seems to offer no benefit, or the condition worsens. Any medications produce adverse side effects.  EXERCISES- RANGE OF MOTION (ROM) AND STRETCHING EXERCISES - Plantar  Fasciitis (Heel Spur Syndrome) These exercises may help you when beginning to rehabilitate your injury. Your symptoms may resolve with or without further involvement from your physician, physical therapist or athletic trainer. While completing these exercises, remember:  Restoring tissue flexibility helps normal motion to return to the joints. This allows healthier, less painful movement and activity. An effective stretch should be held for at least 30 seconds. A stretch should never be painful. You should only feel a gentle lengthening or release in the stretched tissue.  RANGE OF MOTION - Toe Extension, Flexion Sit with your right / left leg crossed over your opposite knee. Grasp your toes and gently pull them back toward the top of your foot. You should feel a stretch on the bottom of your toes and/or foot. Hold this stretch for 10 seconds. Now, gently pull your toes toward the bottom of your foot. You should feel a stretch on the top of your toes and or foot. Hold this stretch for 10 seconds. Repeat  times. Complete this stretch 3 times per day.   RANGE OF MOTION - Ankle Dorsiflexion, Active Assisted Remove shoes and sit on a chair that is preferably not on a carpeted surface. Place right / left foot under knee. Extend your opposite leg for support. Keeping your heel down, slide your right / left foot back toward the chair until you feel a stretch at your ankle or calf. If you do not  feel a stretch, slide your bottom forward to the edge of the chair, while still keeping your heel down. Hold this stretch for 10 seconds. Repeat 3 times. Complete this stretch 2 times per day.   STRETCH  Gastroc, Standing Place hands on wall. Extend right / left leg, keeping the front knee somewhat bent. Slightly point your toes inward on your back foot. Keeping your right / left heel on the floor and your knee straight, shift your weight toward the wall, not allowing your back to arch. You should feel a  gentle stretch in the right / left calf. Hold this position for 10 seconds. Repeat 3 times. Complete this stretch 2 times per day.  STRETCH  Soleus, Standing Place hands on wall. Extend right / left leg, keeping the other knee somewhat bent. Slightly point your toes inward on your back foot. Keep your right / left heel on the floor, bend your back knee, and slightly shift your weight over the back leg so that you feel a gentle stretch deep in your back calf. Hold this position for 10 seconds. Repeat 3 times. Complete this stretch 2 times per day.  STRETCH  Gastrocsoleus, Standing  Note: This exercise can place a lot of stress on your foot and ankle. Please complete this exercise only if specifically instructed by your caregiver.  Place the ball of your right / left foot on a step, keeping your other foot firmly on the same step. Hold on to the wall or a rail for balance. Slowly lift your other foot, allowing your body weight to press your heel down over the edge of the step. You should feel a stretch in your right / left calf. Hold this position for 10 seconds. Repeat this exercise with a slight bend in your right / left knee. Repeat 3 times. Complete this stretch 2 times per day.   STRENGTHENING EXERCISES - Plantar Fasciitis (Heel Spur Syndrome)  These exercises may help you when beginning to rehabilitate your injury. They may resolve your symptoms with or without further involvement from your physician, physical therapist or athletic trainer. While completing these exercises, remember:  Muscles can gain both the endurance and the strength needed for everyday activities through controlled exercises. Complete these exercises as instructed by your physician, physical therapist or athletic trainer. Progress the resistance and repetitions only as guided.  STRENGTH - Towel Curls Sit in a chair positioned on a non-carpeted surface. Place your foot on a towel, keeping your heel on the  floor. Pull the towel toward your heel by only curling your toes. Keep your heel on the floor. Repeat 3 times. Complete this exercise 2 times per day.  STRENGTH - Ankle Inversion Secure one end of a rubber exercise band/tubing to a fixed object (table, pole). Loop the other end around your foot just before your toes. Place your fists between your knees. This will focus your strengthening at your ankle. Slowly, pull your big toe up and in, making sure the band/tubing is positioned to resist the entire motion. Hold this position for 10 seconds. Have your muscles resist the band/tubing as it slowly pulls your foot back to the starting position. Repeat 3 times. Complete this exercises 2 times per day.  Document Released: 09/28/2005 Document Revised: 12/21/2011 Document Reviewed: 01/10/2009 [facility] Patient Information 2014 [place], [place].

## 2024-05-31 NOTE — Progress Notes (Signed)
 Subjective:  Patient ID: Todd [name], male    DOB: 1966-01-26,  MRN: [id number]  Chief Complaint  Patient presents with   Foot Pain    RM 1 Patient is here for pain of the right heel after prolong standing and walking.    Discussed the use of AI scribe software for clinical note transcription with the patient, who gave verbal consent to proceed.  History of Present Illness Todd [name] is a 58 year old male with diabetes who presents with heel pain.  He experiences pain at the bottom of his heel, radiating to the back, which worsens with standing, such as during showers, leading to quick showers due to discomfort. The pain is intermittent, occurring when he is on his feet for extended periods.  He recalls similar issues in the past and had seen a doctor about five years ago for a different foot issue. He has used insoles previously but stopped using them regularly due to the need to readjust to them each time he starts using them again.  He manages his diabetes well, with a recent A1c of 5.8. He monitors his blood sugar regularly, noting occasional feelings of jitteriness when it feels too low. He also monitors his blood pressure, which has been low at times, causing dizziness. He attributes these changes to significant weight loss, having gone from 280 pounds to 205 pounds.  He is cautious about his diet, avoiding sweets and sugary drinks, and prefers to avoid medications unless necessary. He has ibuprofen  at home but does not take it daily, preferring to manage pain without medication when possible.  Reports feeling jittery with low blood sugar and dizziness with low blood pressure.      Objective:    Physical Exam VASCULAR: DP and PT pulse palpable. Foot is warm and well-perfused. Capillary fill time is brisk. DERMATOLOGIC: Normal skin turgor, texture, and temperature. No open lesions, rashes, or ulcerations. NEUROLOGIC: Normal sensation to light touch and pressure. No  paresthesias on examination. ORTHOPEDIC: Smooth pain-free range of motion of all examined joints. No ecchymosis or bruising. No gross deformity. Pain at plantar medial heel at plantar fascia insertion and mild pain on posterior Achilles. Normal palpation of foot except noted areas of pain.   No images are attached to the encounter.    Results LABS A1c: 5.8% Blood glucose: 97-98 mg/dL  RADIOLOGY Right foot X-ray: Mild pes planus, posterior heel spur, no inferior heel spur, no fracture or stress fracture (05/31/2024)   Assessment:   1. Plantar fasciitis, right      Plan:  Patient was evaluated and treated and all questions answered.  Assessment and Plan Assessment & Plan Right plantar fasciitis and Achilles tendinitis with posterior heel spur and mild pes planus Chronic pain in the plantar medial heel at the insertion of the plantar fascia and mild pain on the posterior Achilles. Radiographs show mild pes planus, posterior heel spur, and no inferior heel spur or fracture. Likely plantar fasciitis and Achilles tendinitis. No evidence of stress fracture. Conservative management preferred initially. Steroid injections are considered but avoided initially due to potential damage to the Achilles tendon and temporary elevation of blood sugar levels. - Provide home physical therapy exercises to stretch the calf muscle and plantar fascia. - Recommend use of ibuprofen  as needed for pain management. - Consider steroid injection if pain persists after 5-6 weeks of conservative management. - Schedule follow-up in 6 weeks to reassess pain and treatment efficacy.  Type 2 diabetes mellitus, well controlled Type 2  diabetes is well controlled with an A1c of 5.8. Blood sugar levels are low, with morning readings around 97-98 mg/dL, which he finds too low, causing jitteriness.  Essential hypertension Recent blood pressure readings are low, with a noted reading of 97/57 mmHg, causing dizziness. Weight  loss has contributed to improved blood pressure control.  Obesity, improved with significant weight loss Significant weight loss from 280 pounds to 205 pounds. Weight loss has positively impacted blood pressure and diabetes control.      Return in about 6 weeks (around 07/12/2024) for recheck plantar fasciitis.

## 2024-06-13 ENCOUNTER — Encounter: Payer: Self-pay | Admitting: *Deleted

## 2024-06-16 ENCOUNTER — Ambulatory Visit: Attending: Cardiology | Admitting: Cardiology

## 2024-06-30 ENCOUNTER — Encounter: Payer: Self-pay | Admitting: *Deleted

## 2024-07-03 ENCOUNTER — Ambulatory Visit
Admission: RE | Admit: 2024-07-03 | Discharge: 2024-07-03 | Disposition: A | Discharge status: Home / Self Care | Attending: Gastroenterology | Admitting: Gastroenterology

## 2024-07-03 ENCOUNTER — Encounter
Admission: RE | Disposition: A | Discharge status: Home / Self Care | Payer: Self-pay | Source: Home / Self Care | Attending: Gastroenterology

## 2024-07-03 ENCOUNTER — Encounter: Payer: Self-pay | Admitting: Gastroenterology

## 2024-07-03 ENCOUNTER — Ambulatory Visit: Admitting: Anesthesiology

## 2024-07-03 DIAGNOSIS — E785 Hyperlipidemia, unspecified: Secondary | ICD-10-CM | POA: Diagnosis not present

## 2024-07-03 DIAGNOSIS — E1143 Type 2 diabetes mellitus with diabetic autonomic (poly)neuropathy: Secondary | ICD-10-CM | POA: Diagnosis not present

## 2024-07-03 DIAGNOSIS — Z9049 Acquired absence of other specified parts of digestive tract: Secondary | ICD-10-CM | POA: Diagnosis not present

## 2024-07-03 DIAGNOSIS — Z7984 Long term (current) use of oral hypoglycemic drugs: Secondary | ICD-10-CM | POA: Insufficient documentation

## 2024-07-03 DIAGNOSIS — R1084 Generalized abdominal pain: Secondary | ICD-10-CM | POA: Diagnosis present

## 2024-07-03 DIAGNOSIS — K449 Diaphragmatic hernia without obstruction or gangrene: Secondary | ICD-10-CM | POA: Diagnosis not present

## 2024-07-03 DIAGNOSIS — K3189 Other diseases of stomach and duodenum: Secondary | ICD-10-CM | POA: Diagnosis not present

## 2024-07-03 DIAGNOSIS — K3184 Gastroparesis: Secondary | ICD-10-CM | POA: Insufficient documentation

## 2024-07-03 DIAGNOSIS — K5730 Diverticulosis of large intestine without perforation or abscess without bleeding: Secondary | ICD-10-CM | POA: Insufficient documentation

## 2024-07-03 DIAGNOSIS — K2970 Gastritis, unspecified, without bleeding: Secondary | ICD-10-CM | POA: Diagnosis not present

## 2024-07-03 DIAGNOSIS — K5900 Constipation, unspecified: Secondary | ICD-10-CM | POA: Insufficient documentation

## 2024-07-03 DIAGNOSIS — Z7985 Long-term (current) use of injectable non-insulin antidiabetic drugs: Secondary | ICD-10-CM | POA: Diagnosis not present

## 2024-07-03 DIAGNOSIS — K640 First degree hemorrhoids: Secondary | ICD-10-CM | POA: Insufficient documentation

## 2024-07-03 DIAGNOSIS — Q399 Congenital malformation of esophagus, unspecified: Secondary | ICD-10-CM | POA: Insufficient documentation

## 2024-07-03 DIAGNOSIS — K648 Other hemorrhoids: Secondary | ICD-10-CM | POA: Diagnosis not present

## 2024-07-03 DIAGNOSIS — I10 Essential (primary) hypertension: Secondary | ICD-10-CM | POA: Insufficient documentation

## 2024-07-03 LAB — GLUCOSE, CAPILLARY: Glucose-Capillary: 99 mg/dL (ref 70–99)

## 2024-07-03 SURGERY — COLONOSCOPY
Anesthesia: General

## 2024-07-03 MED ORDER — GLYCOPYRROLATE 0.2 MG/ML IJ SOLN
INTRAMUSCULAR | Status: DC | PRN
  Administered 2024-07-03: .2 mg via INTRAVENOUS

## 2024-07-03 MED ORDER — SODIUM CHLORIDE 0.9 % IV SOLN
INTRAVENOUS | Status: DC
  Administered 2024-07-03: 20 mL/h via INTRAVENOUS

## 2024-07-03 MED ORDER — LIDOCAINE 2% (20 MG/ML) 5 ML SYRINGE
INTRAMUSCULAR | Status: DC | PRN
  Administered 2024-07-03: 20 mg via INTRAVENOUS

## 2024-07-03 MED ORDER — MIDAZOLAM HCL 2 MG/2ML IJ SOLN
INTRAMUSCULAR | Status: AC
  Filled 2024-07-03: qty 2

## 2024-07-03 MED ORDER — GLYCOPYRROLATE 0.2 MG/ML IJ SOLN
INTRAMUSCULAR | Status: AC
  Filled 2024-07-03: qty 1

## 2024-07-03 MED ORDER — LIDOCAINE HCL (PF) 2 % IJ SOLN
INTRAMUSCULAR | Status: AC
  Filled 2024-07-03: qty 5

## 2024-07-03 MED ORDER — MIDAZOLAM HCL 5 MG/5ML IJ SOLN
INTRAMUSCULAR | Status: DC | PRN
  Administered 2024-07-03: 2 mg via INTRAVENOUS

## 2024-07-03 MED ORDER — PROPOFOL 1000 MG/100ML IV EMUL
INTRAVENOUS | Status: AC
  Filled 2024-07-03: qty 100

## 2024-07-03 MED ORDER — DEXMEDETOMIDINE HCL IN NACL 80 MCG/20ML IV SOLN
INTRAVENOUS | Status: DC | PRN
  Administered 2024-07-03: 10 ug via INTRAVENOUS

## 2024-07-03 MED ORDER — PROPOFOL 500 MG/50ML IV EMUL
INTRAVENOUS | Status: DC | PRN
  Administered 2024-07-03: 120 ug/kg/min via INTRAVENOUS

## 2024-07-03 MED ORDER — PROPOFOL 10 MG/ML IV BOLUS
INTRAVENOUS | Status: DC | PRN
  Administered 2024-07-03: 50 mg via INTRAVENOUS
  Administered 2024-07-03: 100 mg via INTRAVENOUS

## 2024-07-03 NOTE — Op Note (Signed)
 [facility] Patient Name: Todd [name] Procedure Date: 07/03/2024 7:29 AM MRN: [id number] Account #: [id number] Date of Birth: [birth year] Admit Type: Outpatient Age: 58 Room: [facility] ENDO ROOM 3 Gender: Male Note Status: Finalized Instrument Name: Upper GI Scope [id number] Procedure:             Upper GI endoscopy Indications:           Generalized abdominal pain Providers:             Ole Schick MD, MD Referring MD:          Ole Schick MD, MD (Referring MD) Medicines:             Monitored Anesthesia Care Complications:         No immediate complications. Estimated blood loss:                         Minimal. Procedure:             Pre-Anesthesia Assessment:                        - Prior to the procedure, a History and Physical was                         performed, and patient medications and allergies were                         reviewed. The risks and benefits of the procedure and                         the sedation options and risks were discussed with the                         patient. All questions were answered and informed                         consent was obtained. Patient identification and                         proposed procedure were verified by the physician, the                         nurse, the anesthesiologist, the anesthetist and the                         technician in the endoscopy suite. Mental Status                         Examination: alert and oriented. Airway Examination:                         normal oropharyngeal airway and neck mobility.                         Respiratory Examination: clear to auscultation. CV                         Examination: normal. Prophylactic Antibiotics: The  patient does not require prophylactic antibiotics.                         Prior Anticoagulants: The patient has taken no                         anticoagulant or antiplatelet agents. ASA Grade                          Assessment: III - A patient with severe systemic                         disease. After reviewing the risks and benefits, the                         patient was deemed in satisfactory condition to                         undergo the procedure. The anesthesia plan was to use                         monitored anesthesia care (MAC). Immediately prior to                         administration of medications, the patient was                         re-assessed for adequacy to receive sedatives. The                         heart rate, respiratory rate, oxygen saturations,                         blood pressure, adequacy of pulmonary ventilation, and                         response to care were monitored throughout the                         procedure. The physical status of the patient was                         re-assessed after the procedure.                        After obtaining informed consent, the endoscope was                         passed under direct vision. Throughout the procedure,                         the patient's blood pressure, pulse, and oxygen                         saturations were monitored continuously. The Endoscope                         was introduced through the mouth, and advanced to the  second part of duodenum. The upper GI endoscopy was                         accomplished without difficulty. The patient tolerated                         the procedure well. Findings:      A small hiatal hernia was present.      The distal esophagus was mildly tortuous.      Patchy mild inflammation characterized by erythema was found in the       gastric antrum. Biopsies were taken with a cold forceps for histology.       Estimated blood loss was minimal.      The examined duodenum was normal. Impression:            - Small hiatal hernia.                        - Tortuous esophagus.                        - Gastritis.  Biopsied.                        - Normal examined duodenum. Recommendation:        - Discharge patient to home.                        - Resume previous diet.                        - Continue present medications.                        - Await pathology results.                        - Return to referring physician as previously                         scheduled. Procedure Code(s):     --- Professional ---                        [phone], Esophagogastroduodenoscopy, flexible,                         transoral; with biopsy, single or multiple Diagnosis Code(s):     --- Professional ---                        K44.9, Diaphragmatic hernia without obstruction or                         gangrene                        Q39.9, Congenital malformation of esophagus,                         unspecified                        K29.70, Gastritis, unspecified, without bleeding  R10.84, Generalized abdominal pain CPT copyright 2022 [employer]. All rights reserved. The codes documented in this report are preliminary and upon coder review may  be revised to meet current compliance requirements. Ole Schick MD, MD 07/03/2024 8:31:51 AM Number of Addenda: 0 Note Initiated On: 07/03/2024 7:29 AM Estimated Blood Loss:  Estimated blood loss was minimal.      [facility]

## 2024-07-03 NOTE — Transfer of Care (Signed)
 Immediate Anesthesia Transfer of Care Note  Patient: Todd [name]  Procedure(s) Performed: COLONOSCOPY EGD (ESOPHAGOGASTRODUODENOSCOPY)  Patient Location: Endoscopy Unit  Anesthesia Type:General  Level of Consciousness: sedated  Airway & Oxygen Therapy: Patient Spontanous Breathing  Post-op Assessment: Report given to RN and Post -op Vital signs reviewed and stable  Post vital signs: Reviewed  Last Vitals:  Vitals Value Taken Time  BP 92/64 07/03/24 08:24  Temp 35.9 C 07/03/24 08:23  Pulse 88 07/03/24 08:24  Resp 12 07/03/24 08:23  SpO2 96 % 07/03/24 08:24  Vitals shown include unfiled device data.  Last Pain:  Vitals:   07/03/24 0823  TempSrc: Tympanic  PainSc: Asleep         Complications: No notable events documented.

## 2024-07-03 NOTE — H&P (Signed)
 Outpatient short stay form Pre-procedure 07/03/2024  Todd ONEIDA Schick, MD  Primary Physician: Ernie Yancy Roof, MD  Reason for visit:  Abdominal pain  History of present illness:    58 y/o gentleman with history of hypertension, HLD, DM II, and chronic pain here for EGD/Colonoscopy for abdominal pain. No blood thinners. History of cholecystectomy. Last colonoscopy in 2019 was unremarkable.    Current Facility-Administered Medications:    0.9 %  sodium chloride  infusion, , Intravenous, Continuous, Jonah Gingras, Todd ONEIDA, MD, Last Rate: 20 mL/hr at 07/03/24 0651, 20 mL/hr at 07/03/24 0651  Medications Prior to Admission  Medication Sig Dispense Refill Last Dose/Taking   acetaminophen  (TYLENOL ) 500 MG tablet Take 500 mg by mouth every 6 (six) hours as needed.   07/02/2024   alfuzosin  (UROXATRAL ) 10 MG 24 hr tablet Take 1 tablet (10 mg total) by mouth daily with breakfast. 90 tablet 1 07/02/2024   aspirin  EC 81 MG tablet Take 1 tablet (81 mg total) by mouth daily. Swallow whole.   Past Week   atorvastatin (LIPITOR) 40 MG tablet Take 40 mg by mouth daily.   07/02/2024   buPROPion (WELLBUTRIN XL) 150 MG 24 hr tablet Take 150 mg by mouth 2 (two) times daily.   07/02/2024   cetirizine (ZYRTEC) 10 MG chewable tablet Chew 10 mg by mouth daily.   07/02/2024   cloNIDine (CATAPRES) 0.2 MG tablet Take 0.2 mg by mouth 2 (two) times daily.  0 07/02/2024   dapagliflozin propanediol (FARXIGA) 10 MG TABS tablet Take by mouth daily.   07/02/2024   hydrochlorothiazide (HYDRODIURIL) 25 MG tablet Take 25 mg by mouth daily.   07/02/2024   ibuprofen  (ADVIL ) 200 MG tablet Take 400 mg by mouth every 6 (six) hours as needed for headache or moderate pain.   Past Week   lisinopril  (PRINIVIL ,ZESTRIL ) 40 MG tablet Take 1 tablet (40 mg total) by mouth daily. 30 tablet 0 07/02/2024   metoCLOPramide  (REGLAN ) 10 MG tablet Take 10 mg by mouth 3 (three) times daily as needed for nausea or vomiting.   07/02/2024   Omega-3 Fatty  Acids (OMEGA-3 EPA FISH OIL PO) Take by mouth.   07/02/2024   omeprazole (PRILOSEC) 40 MG capsule Take 40 mg by mouth 2 (two) times daily.   07/02/2024   OZEMPIC, 0.25 OR 0.5 MG/DOSE, 2 MG/3ML SOPN Inject 0.5 mg into the skin once a week.   Past Week   sitaGLIPtin (JANUVIA) 100 MG tablet Take 100 mg by mouth daily.   Past Week   tadalafil  (CIALIS ) 5 MG tablet Take 1 tablet (5 mg total) by mouth daily as needed for erectile dysfunction. 30 tablet 11      Allergies  Allergen Reactions   Bactrim  [Sulfamethoxazole -Trimethoprim ] Itching and Other (See Comments)    Causes blisters   Metoprolol  Other (See Comments)    Pt states he can't take this medication.  Unknown Reaction.   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  Migraines 2-3x/yr.   HTN (hypertension) 05/19/2016   Hypertension    MDD (major depressive disorder), recurrent episode (HCC) 05/28/2016   Neck pain, bilateral 02/15/2016   Non-intractable vomiting with nausea 08/26/2017   Primary osteoarthritis of right knee 01/31/2016   and fingers   PTSD (post-traumatic stress disorder) 06/05/2016   Severe recurrent major depression without psychotic features (HCC) 05/19/2016   Sleep apnea    unable to tolerate CPAP   Substance induced  mood disorder (HCC) 05/19/2016   Suicidal ideation 05/19/2016   Type 2 diabetes mellitus with complication, without long-term current use of insulin (HCC) 06/08/2017   Uncontrolled type 2 diabetes mellitus with hyperglycemia, without long-term current use of insulin (HCC) 03/12/2017   Vertigo     Review of systems:  Otherwise negative.    Physical Exam  Gen: Alert, oriented. Appears stated age.  HEENT: PERRLA. Lungs: No respiratory distress CV: RRR Abd: soft, benign, no masses Ext: No edema   Planned procedures: Proceed with EGD/colonoscopy. The patient understands the nature of the planned procedure, indications, risks, alternatives and potential complications including but not limited to bleeding, infection, perforation, damage to internal organs and possible oversedation/side effects from anesthesia. The patient agrees and gives consent to proceed.  Please refer to procedure notes for findings, recommendations and patient disposition/instructions.     Todd ONEIDA Schick, MD [facility]

## 2024-07-03 NOTE — Op Note (Signed)
 [facility] Patient Name: Todd [name] Procedure Date: 07/03/2024 7:28 AM MRN: [id number] Account #: [id number] Date of Birth: Apr 30, 1966 Admit Type: Outpatient Age: 58 Room: [facility] ENDO ROOM 3 Gender: Male Note Status: Finalized Instrument Name: Colon Scope [id number] Procedure:             Colonoscopy Indications:           Generalized abdominal pain, Incidental constipation                         noted Providers:             Ole Schick MD, MD Referring MD:          Ole Schick MD, MD (Referring MD) Medicines:             Monitored Anesthesia Care Complications:         No immediate complications. Procedure:             Pre-Anesthesia Assessment:                        - Prior to the procedure, a History and Physical was                         performed, and patient medications and allergies were                         reviewed. The patient is competent. The risks and                         benefits of the procedure and the sedation options and                         risks were discussed with the patient. All questions                         were answered and informed consent was obtained.                         Patient identification and proposed procedure were                         verified by the physician, the nurse, the                         anesthesiologist, the anesthetist and the technician                         in the endoscopy suite. Mental Status Examination:                         alert and oriented. Airway Examination: normal                         oropharyngeal airway and neck mobility. Respiratory                         Examination: clear to auscultation. CV Examination:  normal. Prophylactic Antibiotics: The patient does not                         require prophylactic antibiotics. Prior                         Anticoagulants: The patient has taken no anticoagulant                          or antiplatelet agents. ASA Grade Assessment: III - A                         patient with severe systemic disease. After reviewing                         the risks and benefits, the patient was deemed in                         satisfactory condition to undergo the procedure. The                         anesthesia plan was to use monitored anesthesia care                         (MAC). Immediately prior to administration of                         medications, the patient was re-assessed for adequacy                         to receive sedatives. The heart rate, respiratory                         rate, oxygen saturations, blood pressure, adequacy of                         pulmonary ventilation, and response to care were                         monitored throughout the procedure. The physical                         status of the patient was re-assessed after the                         procedure.                        After obtaining informed consent, the colonoscope was                         passed under direct vision. Throughout the procedure,                         the patient's blood pressure, pulse, and oxygen                         saturations were monitored continuously. The  Colonoscope was introduced through the anus and                         advanced to the the terminal ileum, with                         identification of the appendiceal orifice and IC                         valve. The colonoscopy was performed without                         difficulty. The patient tolerated the procedure well.                         The quality of the bowel preparation was good. The                         terminal ileum, ileocecal valve, appendiceal orifice,                         and rectum were photographed. Findings:      The perianal and digital rectal examinations were normal.      The terminal ileum appeared normal.      Multiple small-mouthed  diverticula were found in the sigmoid colon.      Internal hemorrhoids were found during retroflexion. The hemorrhoids       were Grade I (internal hemorrhoids that do not prolapse).      The exam was otherwise without abnormality on direct and retroflexion       views. Impression:            - The examined portion of the ileum was normal.                        - Diverticulosis in the sigmoid colon.                        - Internal hemorrhoids.                        - The examination was otherwise normal on direct and                         retroflexion views.                        - No specimens collected. Recommendation:        - Discharge patient to home.                        - Resume previous diet.                        - Continue present medications.                        - Repeat colonoscopy in 10 years for surveillance.                        - Return to referring physician as previously  scheduled. Procedure Code(s):     --- Professional ---                        [phone], Colonoscopy, flexible; diagnostic, including                         collection of specimen(s) by brushing or washing, when                         performed (separate procedure) Diagnosis Code(s):     --- Professional ---                        K64.0, First degree hemorrhoids                        R10.84, Generalized abdominal pain                        K57.30, Diverticulosis of large intestine without                         perforation or abscess without bleeding CPT copyright 2022 [employer]. All rights reserved. The codes documented in this report are preliminary and upon coder review may  be revised to meet current compliance requirements. Ole Schick MD, MD 07/03/2024 8:35:31 AM Number of Addenda: 0 Note Initiated On: 07/03/2024 7:28 AM Scope Withdrawal Time: 0 hours 7 minutes 53 seconds  Total Procedure Duration: 0 hours 12 minutes 39 seconds   Estimated Blood Loss:  Estimated blood loss: none.      [facility]

## 2024-07-03 NOTE — Interval H&P Note (Signed)
 History and Physical Interval Note:  07/03/2024 7:56 AM  Todd [name]  has presented today for surgery, with the diagnosis of Constipation, unspecified constipation type (K59.00) Generalized abdominal pain (R10.84).  The various methods of treatment have been discussed with the patient and family. After consideration of risks, benefits and other options for treatment, the patient has consented to  Procedure(s): COLONOSCOPY (N/A) EGD (ESOPHAGOGASTRODUODENOSCOPY) (N/A) as a surgical intervention.  The patient's history has been reviewed, patient examined, no change in status, stable for surgery.  I have reviewed the patient's chart and labs.  Questions were answered to the patient's satisfaction.     [name]  Ok to proceed with EGD/Colonoscopy

## 2024-07-03 NOTE — Anesthesia Postprocedure Evaluation (Signed)
 Anesthesia Post Note  Patient: Todd [name]  Procedure(s) Performed: COLONOSCOPY EGD (ESOPHAGOGASTRODUODENOSCOPY)  Patient location during evaluation: PACU Anesthesia Type: General Level of consciousness: awake and alert Pain management: pain level controlled Vital Signs Assessment: post-procedure vital signs reviewed and stable Respiratory status: spontaneous breathing, nonlabored ventilation, respiratory function stable and patient connected to nasal cannula oxygen Cardiovascular status: blood pressure returned to baseline and stable Postop Assessment: no apparent nausea or vomiting Anesthetic complications: no   No notable events documented.   Last Vitals:  Vitals:   07/03/24 0833 07/03/24 0843  BP: 105/74 123/82  Pulse: 79 78  Resp: 18 20  Temp:    SpO2: 97% 100%    Last Pain:  Vitals:   07/03/24 0843  TempSrc:   PainSc: 0-No pain                 Lynwood KANDICE Clause

## 2024-07-03 NOTE — Anesthesia Preprocedure Evaluation (Signed)
 Anesthesia Evaluation  Patient identified by MRN, date of birth, ID band Patient awake    Reviewed: Allergy & Precautions, H&P , NPO status , Patient's Chart, lab work & pertinent test results, reviewed documented beta blocker date and time   Airway Mallampati: II   Neck ROM: full    Dental  (+) Poor Dentition   Pulmonary sleep apnea , former smoker   Pulmonary exam normal        Cardiovascular Exercise Tolerance: Poor hypertension, On Medications negative cardio ROS Normal cardiovascular exam Rhythm:regular Rate:Normal     Neuro/Psych  Headaches PSYCHIATRIC DISORDERS Anxiety Depression     Neuromuscular disease    GI/Hepatic Neg liver ROS,GERD  Medicated,,  Endo/Other  negative endocrine ROSdiabetes    Renal/GU negative Renal ROS  negative genitourinary   Musculoskeletal   Abdominal   Peds  Hematology negative hematology ROS (+)   Anesthesia Other Findings Past Medical History: No date: Anxiety 11/09/2016: Arthrosis of left acromioclavicular joint 02/15/2016: Chronic left shoulder pain No date: Depression No date: Diabetes mellitus without complication (HCC) 06/05/2016: Dislocation of right thumb No date: Diverticulitis No date: Employs prosthetic leg     Comment:  Left - below the knee 06/08/2017: Gastroparesis No date: GERD (gastroesophageal reflux disease) No date: Headache     Comment:  daily.  Migraines 2-3x/yr. 05/19/2016: HTN (hypertension) No date: Hypertension 05/28/2016: MDD (major depressive disorder), recurrent episode (HCC) 02/15/2016: Neck pain, bilateral 08/26/2017: Non-intractable vomiting with nausea 01/31/2016: Primary osteoarthritis of right knee     Comment:  and fingers 06/05/2016: PTSD (post-traumatic stress disorder) 05/19/2016: Severe recurrent major depression without psychotic  features (HCC) No date: Sleep apnea     Comment:  unable to tolerate CPAP 05/19/2016: Substance  induced mood disorder (HCC) 05/19/2016: Suicidal ideation 06/08/2017: Type 2 diabetes mellitus with complication, without long- term current use of insulin (HCC) 03/12/2017: Uncontrolled type 2 diabetes mellitus with hyperglycemia,  without long-term current use of insulin (HCC) No date: Vertigo Past Surgical History: 11/209/18 and 10/08/17: BACK SURGERY     Comment:  x 2 No date: CHOLECYSTECTOMY 07/25/2020: CIRCUMCISION; N/A     Comment:  Procedure: CIRCUMCISION ADULT;  Surgeon: Penne Knee, MD;  Location: [facility] ORS;  Service: Urology;                Laterality: N/A; 01/31/2018: COLONOSCOPY WITH PROPOFOL ; N/A     Comment:  Procedure: COLONOSCOPY WITH PROPOFOL ;  Surgeon: Viktoria Lamar DASEN, MD;  Location: [facility];  Service:               Endoscopy;  Laterality: N/A; No date: ESOPHAGOGASTRODUODENOSCOPY 01/31/2018: ESOPHAGOGASTRODUODENOSCOPY (EGD) WITH PROPOFOL ; N/A     Comment:  Procedure: ESOPHAGOGASTRODUODENOSCOPY (EGD) WITH               PROPOFOL ;  Surgeon: Viktoria Lamar DASEN, MD;  Location:               [facility] ENDOSCOPY;  Service: Endoscopy;  Laterality: N/A; No date: LEG AMPUTATION BELOW KNEE     Comment:  left 04/17/2020: NASAL SEPTOPLASTY W/ TURBINOPLASTY; Bilateral     Comment:  Procedure: NASAL SEPTOPLASTY WITH INFERIORTURBINATE               REDUCTION;  Surgeon: Milissa Hamming, MD;  Location:  [facility];  Service: ENT;  Laterality:               Bilateral;  Diabetic - oral meds No date: SHOULDER SURGERY; Left No date: SHOULDER SURGERY; Right BMI    Body Mass Index: 33.45 kg/m     Reproductive/Obstetrics negative OB ROS                              Anesthesia Physical Anesthesia Plan  ASA: 3  Anesthesia Plan: General   Post-op Pain Management:    Induction:   PONV Risk Score and Plan:   Airway Management Planned:   Additional Equipment:   Intra-op Plan:    Post-operative Plan:   Informed Consent: I have reviewed the patients History and Physical, chart, labs and discussed the procedure including the risks, benefits and alternatives for the proposed anesthesia with the patient or authorized representative who has indicated his/her understanding and acceptance.     Dental Advisory Given  Plan Discussed with: CRNA  Anesthesia Plan Comments:         Anesthesia Quick Evaluation

## 2024-07-04 LAB — SURGICAL PATHOLOGY

## 2024-07-19 ENCOUNTER — Ambulatory Visit (INDEPENDENT_AMBULATORY_CARE_PROVIDER_SITE_OTHER): Admitting: Podiatry

## 2024-07-19 VITALS — Ht 68.0 in | Wt 220.0 lb

## 2024-07-19 DIAGNOSIS — M722 Plantar fascial fibromatosis: Secondary | ICD-10-CM

## 2024-07-19 DIAGNOSIS — M25571 Pain in right ankle and joints of right foot: Secondary | ICD-10-CM

## 2024-07-19 NOTE — Progress Notes (Signed)
  Subjective:  Patient ID: Todd [name], male    DOB: Dec 06, 1965,  MRN: [id number]  Chief Complaint  Patient presents with   Plantar Fasciitis    Rm 1 Patient is here to f/u on plantar fasciitis. Pt states plantar pain has improved by 90%. Pt is concerned with pain in the right ankle when standing. Pt states no injury to ankle. Pain present x three weeks.    58 y.o. male presents with the above complaint. History confirmed with patient.  Has had quite a bit of improvement with his plantar fasciitis  Objective:  Physical Exam: warm, good capillary refill, no trophic changes or ulcerative lesions, normal DP and PT pulses, normal sensory exam, and plantar fasciitis nearly fully resolved he has tenderness in the sinus tarsi on the right foot.   Radiographs: Multiple views x-ray of the right foot: Right foot radiographs taken at previous visit shows arthritic changes in the posterior subtalar joint Assessment:   1. Plantar fasciitis, right   2. Sinus tarsi syndrome of right ankle      Plan:  Patient was evaluated and treated and all questions answered.  Plantar fasciitis nearly fully resolved continue home therapy and exercises and supportive shoe gear.  We discussed the sinus tarsi syndrome he has currently experiencing and his previous radiographs do show some arthritic changes.  We discussed treatment of this including OTC medications with Tylenol  as needed.  If this does not improve he will return to see me for injection we discussed supportive shoe gear and bracing that may help as well.  No follow-ups on file.

## 2024-08-14 ENCOUNTER — Encounter: Payer: Self-pay | Admitting: Radiology

## 2024-09-12 ENCOUNTER — Ambulatory Visit: Attending: Cardiology

## 2024-11-08 NOTE — Therapy (Signed)
 " OUTPATIENT PHYSICAL THERAPY SHOULDER EVALUATION   Patient Name: Todd [name] MRN: [id number] DOB:[birth year], 59 y.o., male Today's Date: 11/14/2024  END OF SESSION:  PT End of Session - 11/14/24 1323     Visit Number 1    Number of Visits 17    PT Start Time 1428    PT Stop Time 1510    PT Time Calculation (min) 42 min    Activity Tolerance Patient tolerated treatment well;No increased pain    Behavior During Therapy [facility] for tasks assessed/performed          Past Medical History:  Diagnosis Date   Anxiety    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   Chronic left shoulder pain 02/15/2016   Depression    Diabetes mellitus without complication (HCC)    Dislocation of right thumb 06/05/2016   Diverticulitis    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  Migraines 2-3x/yr.   HTN (hypertension) 05/19/2016   Hypertension    MDD (major depressive disorder), recurrent episode 05/28/2016   Neck pain, bilateral 02/15/2016   Non-intractable vomiting with nausea 08/26/2017   Primary osteoarthritis of right knee 01/31/2016   and fingers   PTSD (post-traumatic stress disorder) 06/05/2016   Severe recurrent major depression without psychotic features (HCC) 05/19/2016   Sleep apnea    unable to tolerate CPAP   Substance induced mood disorder (HCC) 05/19/2016   Suicidal ideation 05/19/2016   Type 2 diabetes mellitus with complication, without long-term current use of insulin (HCC) 06/08/2017   Uncontrolled type 2 diabetes mellitus with hyperglycemia, without long-term current use of insulin (HCC) 03/12/2017   Vertigo    Past Surgical History:  Procedure Laterality Date   BACK SURGERY  11/209/18 and 10/08/17   x 2   CHOLECYSTECTOMY     CIRCUMCISION N/A 07/25/2020   Procedure: CIRCUMCISION ADULT;  Surgeon: Penne Knee, MD;  Location: [facility] ORS;  Service: Urology;   Laterality: N/A;   COLONOSCOPY N/A 07/03/2024   Procedure: COLONOSCOPY;  Surgeon: Maryruth Ole DASEN, MD;  Location: [facility] ENDOSCOPY;  Service: Endoscopy;  Laterality: N/A;   COLONOSCOPY WITH PROPOFOL  N/A 01/31/2018   Procedure: COLONOSCOPY WITH PROPOFOL ;  Surgeon: Viktoria Lamar DASEN, MD;  Location: [facility] ENDOSCOPY;  Service: Endoscopy;  Laterality: N/A;   ESOPHAGOGASTRODUODENOSCOPY     ESOPHAGOGASTRODUODENOSCOPY N/A 07/03/2024   Procedure: EGD (ESOPHAGOGASTRODUODENOSCOPY);  Surgeon: Maryruth Ole DASEN, MD;  Location: [facility] ENDOSCOPY;  Service: Endoscopy;  Laterality: N/A;   ESOPHAGOGASTRODUODENOSCOPY (EGD) WITH PROPOFOL  N/A 01/31/2018   Procedure: ESOPHAGOGASTRODUODENOSCOPY (EGD) WITH PROPOFOL ;  Surgeon: Viktoria Lamar DASEN, MD;  Location: [facility] ENDOSCOPY;  Service: Endoscopy;  Laterality: N/A;   LEG AMPUTATION BELOW KNEE     left   NASAL SEPTOPLASTY W/ TURBINOPLASTY Bilateral 04/17/2020   Procedure: NASAL SEPTOPLASTY WITH INFERIORTURBINATE REDUCTION;  Surgeon: Milissa Hamming, MD;  Location: [facility];  Service: ENT;  Laterality: Bilateral;  Diabetic - oral meds   SHOULDER SURGERY Left    SHOULDER SURGERY Right    Patient Active Problem List   Diagnosis Date Noted   Severe obstructive sleep apnea 06/25/2023   Class 2 obesity with body mass index (BMI) of 39.0 to 39.9 in adult 06/25/2023   Obesity (BMI 35.0-39.9 without comorbidity) 12/14/2019   Metatarsalgia of right foot 10/30/2019   Acquired absence of extremity 10/30/2019   Closed fracture of distal end of radius 05/31/2018  Radiculopathy of lumbosacral region 09/30/2017   Spondylolisthesis at L5-S1 level 09/30/2017   Morbid obesity (HCC) 09/06/2017   Hepatic steatosis 08/26/2017   Non-intractable vomiting with nausea 08/26/2017   Upper abdominal pain 08/26/2017   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   Gastroparesis 06/08/2017   History of noncompliance with medical treatment 06/08/2017   Type 2 diabetes mellitus with obesity 06/08/2017    Uncontrolled type 2 diabetes mellitus with hyperglycemia (HCC) 03/12/2017   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   Dislocation of right thumb 06/05/2016   Anxiety disorder 06/05/2016   MDD (major depressive disorder), recurrent episode 05/28/2016   Substance induced mood disorder (HCC) 05/19/2016   Severe recurrent major depression without psychotic features (HCC) 05/19/2016   Suicidal ideation 05/19/2016   Essential (primary) hypertension 05/19/2016   GERD (gastroesophageal reflux disease) 05/19/2016   Alcohol use disorder, mild, abuse 05/19/2016   Chronic left shoulder pain 02/15/2016   Neck pain, bilateral 02/15/2016   Primary osteoarthritis of right knee 01/31/2016    PCP: Ernie Yancy Roof, MD   REFERRING PROVIDER: Jordan, Sarah, PA-C   REFERRING DIAG: Pain in left shoulder  THERAPY DIAG:  Chronic left shoulder pain  Stiffness of left shoulder, not elsewhere classified  Chronic right shoulder pain  Muscle weakness (generalized)  Rationale for Evaluation and Treatment: Rehabilitation  ONSET DATE: Chronic  SUBJECTIVE:                                                                                                                                                                                      SUBJECTIVE STATEMENT: States pain has been there for a long time, he's not sure when. He has limited ROM in multiple directions. Feels like all muscles in left shoulder to wrist is painful. States he had surgeries done on both shoulders. He mentions wanting both shoulders to be treated because both are in pain and weak. 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 Has more pain at night. Can't lay on stomach because it hurts too much. Only comfortable on Right side when laying. Mentions living in a bad area with wife.   PERTINENT HISTORY: Patient is a 59 y.o. male who presents to outpatient physical therapy with a referral for medical diagnosis of Pain in  left shoulder. This patient's chief complaints consist of bilateral shoulder pain, leading to the following functional deficits: weightlifting, general ROM. Relevant past medical history and comorbidities include the following: he has Substance induced mood disorder (HCC); Severe recurrent major depression without psychotic features (HCC); Suicidal ideation; Essential (primary) hypertension; GERD (gastroesophageal reflux disease); Alcohol use disorder, mild, abuse; Amputee; Arthrosis of left acromioclavicular joint; Chronic left  shoulder pain; Dislocation of right thumb; Gastroparesis; History of noncompliance with medical treatment; MDD (major depressive disorder), recurrent episode; Neck pain, bilateral; Primary osteoarthritis of right knee; Anxiety disorder; Type 2 diabetes mellitus with obesity; Uncontrolled type 2 diabetes mellitus with hyperglycemia (HCC); Closed fracture of distal end of radius; Hepatic steatosis; Non-intractable vomiting with nausea; Morbid obesity (HCC); Radiculopathy of lumbosacral region; Spondylolisthesis at L5-S1 level; Upper abdominal pain; Metatarsalgia of right foot; Acquired absence of extremity; Obesity (BMI 35.0-39.9 without comorbidity); Severe obstructive sleep apnea; and Class 2 obesity with body mass index (BMI) of 39.0 to 39.9 in adult on their problem list. he  has a past medical history of Anxiety, Arthrosis of left acromioclavicular joint (11/09/2016), Chronic left shoulder pain (02/15/2016), Depression, Diabetes mellitus without complication (HCC), Dislocation of right thumb (06/05/2016), Diverticulitis, Employs prosthetic leg, Gastroparesis (06/08/2017), GERD (gastroesophageal reflux disease), Headache, HTN (hypertension) (05/19/2016), Hypertension, MDD (major depressive disorder), recurrent episode (05/28/2016), Neck pain, bilateral (02/15/2016), Non-intractable vomiting with nausea (08/26/2017), Primary osteoarthritis of right knee (01/31/2016), PTSD (post-traumatic  stress disorder) (06/05/2016), Severe recurrent major depression without psychotic features (HCC) (05/19/2016), Sleep apnea, Substance induced mood disorder (HCC) (05/19/2016), Suicidal ideation (05/19/2016), Type 2 diabetes mellitus with complication, without long-term current use of insulin (HCC) (06/08/2017), Uncontrolled type 2 diabetes mellitus with hyperglycemia, without long-term current use of insulin (HCC) (03/12/2017), and Vertigo. he  has a past surgical history that includes Leg amputation below knee; Shoulder surgery (Left); Back surgery (11/209/18 and 10/08/17); Esophagogastroduodenoscopy (egd) with propofol  (N/A, 01/31/2018); Colonoscopy with propofol  (N/A, 01/31/2018); Nasal septoplasty w/ turbinoplasty (Bilateral, 04/17/2020); Circumcision (N/A, 07/25/2020); Shoulder surgery (Right); Esophagogastroduodenoscopy; Cholecystectomy; Colonoscopy (N/A, 07/03/2024); and Esophagogastroduodenoscopy (N/A, 07/03/2024).      PAIN:   Are you having pain? Yes NPRS: Current: 7/10,  Best: 6-7/10, Worst: 10/10. Pain location: directly over Left bicipital tendon, with TTP  Pain description: achey, sharp,  Aggravating factors: laying down, bench pressing, opening door (twisting motion) Relieving factors: heating pad, hot shower     PRECAUTIONS: None  RED FLAGS: Patient denies hx of cancer, stroke, seizures, lung problems, heart problems, unexplained weight loss, unexplained changes in bowel or bladder problems, unexplained stumbling or dropping things, and osteoporosis Had a spinal surgery before for spinal stenosis    WEIGHT BEARING RESTRICTIONS: No  FALLS:  Has patient fallen in last 6 months? Yes. Number of falls 3, all when trying to get in tub  OCCUPATION: On disability   PATIENT GOALS: To go back to the gym with less pain    OBJECTIVE:  Note: Objective measures were completed at Evaluation unless otherwise noted.  DIAGNOSTIC FINDINGS:  No relevant diagnostic findings per chart  review.  PATIENT SURVEYS:  UEFS  Extreme difficulty/unable (0), Quite a bit of difficulty (1), Moderate difficulty (2), Little difficulty (3), No difficulty (4) Survey date:    Any of your usual work, household or school activities 1  2. Your usual hobbies, recreational/sport activities 1   3. Lifting a bag of groceries to waist level 2   4. Lifting a bag of groceries above your head 1  5. Grooming your hair 0  6. Pushing up on your hands (I.e. from bathtub or chair) 1  7. Preparing food (I.e. peeling/cutting) 4  8. Driving  4  9. Vacuuming, sweeping, or raking 2  10. Dressing  1  11. Doing up buttons 3  12. Using tools/appliances 2  13. Opening doors 3  14. Cleaning  2  15. Tying or lacing shoes 2  16. Sleeping  0  17. Laundering clothes (I.e. washing, ironing, folding) 0  18. Opening a jar 2  19. Throwing a ball 1  20. Carrying a small suitcase with your affected limb.  2  Score total:  34/80     COGNITION: Overall cognitive status: Within functional limits for tasks assessed     SENSATION: WFL  POSTURE: WFL  UPPER EXTREMITY ROM:   Active ROM Right eval Left eval  Shoulder flexion 160 140  Shoulder extension    Shoulder abduction 162 85  Shoulder adduction    Shoulder internal rotation Limited by stiffness, finger around T11 Limited by conc. pain, finger around T11  Shoulder external rotation WFL Limited by conc pain, hand at side of head  Elbow flexion    Elbow extension    Wrist flexion    Wrist extension    Wrist ulnar deviation    Wrist radial deviation    Wrist pronation    Wrist supination    (Blank rows = not tested)  UPPER EXTREMITY MMT:  MMT Right eval Left eval  Shoulder flexion 5/5 4/5 pain  Shoulder extension    Shoulder abduction 5/5 4/5 discordant pain on medial and lateral elbow  Shoulder adduction    Shoulder internal rotation 5/5 4/5   Shoulder external rotation 4/5 4/5 conc pain  Middle trapezius    Lower trapezius    Elbow  flexion 5/5 5/5  Elbow extension    Wrist flexion    Wrist extension    Wrist ulnar deviation    Wrist radial deviation    Wrist pronation    Wrist supination    Grip strength (lbs)    (Blank rows = not tested)  SHOULDER SPECIAL TESTS: Special test not assessed at eval, if warranted will further assess at future visits    PALPATION:  TTP around bicipital tendon of L shoulder                                                                                                                             TREATMENT DATE: 11/14/2024  Therapeutic exercise: therapeutic exercises that incorporate ONE parameter at one or more areas of the body to centralize symptoms, develop strength and endurance, range of motion, and flexibility required for successful completion of functional activities.  Banded W's with grey TB  1 x 10 too easy   Banded resisted ER  1 x 10 with grey TB  Added to HEP   Standing Shoulder Horizontal Abduction with Resistance  3 x 10 with grey TB Added to HEP    PATIENT EDUCATION: Education details: HEP, POC, goals Person educated: Patient Education method: Explanation, Demonstration, and Handouts Education comprehension: verbalized understanding and returned demonstration  HOME EXERCISE PROGRAM: Access Code: [email or web address] URL: [email or web address] Prepared by: Vernell Mariscal  Exercises - Shoulder External Rotation with Anchored Resistance  - 1-2 x daily - 3-4 x weekly - 3 sets - 10 reps - Standing Shoulder Horizontal Abduction with Resistance  -  1-2 x daily - 3-4 x weekly - 3 sets - 10 reps  ASSESSMENT:  CLINICAL IMPRESSION: Patient is a 59 y.o. male referred to outpatient physical therapy with a medical diagnosis of pain in left shoulder who presents with signs and symptoms consistent with overall muscle weakness in supraspinatus, deltoids, and teres minor . This is supported by the following findings: limited ROM in abduction, flexion, ER, and IR.  Patient also limited in MMT of those muscle movements. Patient presents with significant ROM impairments that are limiting ability to complete lifting weights, completing chores, grooming, and sleeping without difficulty. Patient will benefit from skilled physical therapy intervention to address current body structure impairments and activity limitations to improve function and work towards goals set in current POC in order to return to prior level of function or maximal functional improvement.      OBJECTIVE IMPAIRMENTS: decreased activity tolerance, decreased mobility, decreased ROM, decreased strength, impaired flexibility, impaired UE functional use, and pain.   ACTIVITY LIMITATIONS: carrying, lifting, bathing, dressing, and reach over head  PARTICIPATION LIMITATIONS: cleaning and yard work  PERSONAL FACTORS: Age, Behavior pattern, Past/current experiences, and Time since onset of injury/illness/exacerbation are also affecting patient's functional outcome.   REHAB POTENTIAL: Good  CLINICAL DECISION MAKING: Stable/uncomplicated  EVALUATION COMPLEXITY: Low   GOALS: GOALS: Goals reviewed with patient? Yes  SHORT TERM GOALS: Target date: 11/28/2024  Patient will be independent with initial home exercise program for self-management of symptoms. Baseline: Initial HEP provided at IE (11/14/24); Goal status: INITIAL  LONG TERM GOALS: Target date: 02/06/2025  Patient will be independent with a long-term home exercise program for self-management of symptoms.  Baseline: Initial HEP provided at IE (11/14/24); Goal status: INITIAL  2.  Patient will demonstrate improved Upper Extremity Functional Scale (UEFS) to equal or greater than 64/80 to demonstrate improvement in overall condition and self-reported functional ability.  Baseline: 34/80 (11/14/24); Goal status: INITIAL  3.  Patient will demonstrate improved ROM in L shoulder abduction to 120 degrees to be able to perform lateral  raises at gym without limitations.  Baseline: 85 degrees of Left shoulder (11/14/24); Goal status: INITIAL  4.  Patient will report ability to return to gym activities with improvement in pain and motion to achieve his personal goal Baseline: unable to attend gym due to pain with exercise (11/14/24); Goal status: INITIAL  5.  Patient will demonstrate improvement in Patient Specific Functional Scale (PSFS) of equal or greater than 8/10 points to reflect clinically significant improvement in patient's most valued functional activities. Baseline: to be measured at visit 2 as appropriate (11/14/24); Goal status: INITIAL  6.  Patient will report NPRS equal or less than 3/10 during functional activities during the last 2 weeks to improve their abilitly to complete community, work and/or recreational activities with less limitation. Baseline: 7/10 (11/14/24); Goal status: INITIAL  PLAN:  PT FREQUENCY: 1-2x/week  PT DURATION: 8 weeks  PLANNED INTERVENTIONS: 97164- PT Re-evaluation, 97750- Physical Performance Testing, 97110-Therapeutic exercises, 97530- Therapeutic activity, [id number]- Neuromuscular re-education, 97535- Self Care, [id number]- Manual therapy, and Patient/Family education  PLAN FOR NEXT SESSION: Consider doing PSFS, review HEP and how he feels, consider using UE shoulder strengthening exercises to address impairments. Progress and adjust HEP as needed.   Carrington Olazabal SPT  Maryanne Finder, PT, DPT Physical Therapist - [facility] 11/14/2024, 5:00 PM  "

## 2024-11-14 ENCOUNTER — Encounter: Payer: Self-pay | Admitting: Physical Therapy

## 2024-11-14 ENCOUNTER — Ambulatory Visit

## 2024-11-14 DIAGNOSIS — M25612 Stiffness of left shoulder, not elsewhere classified: Secondary | ICD-10-CM

## 2024-11-14 DIAGNOSIS — G8929 Other chronic pain: Secondary | ICD-10-CM

## 2024-11-14 DIAGNOSIS — M6281 Muscle weakness (generalized): Secondary | ICD-10-CM

## 2024-11-16 NOTE — Therapy (Unsigned)
 " OUTPATIENT PHYSICAL THERAPY SHOULDER EVALUATION   Patient Name: Todd [name] MRN: [id number] DOB:1965/11/01, 59 y.o., male Today's Date: 11/16/2024  END OF SESSION:    Past Medical History:  Diagnosis Date   Anxiety    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   Chronic left shoulder pain 02/15/2016   Depression    Diabetes mellitus without complication (HCC)    Dislocation of right thumb 06/05/2016   Diverticulitis    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  Migraines 2-3x/yr.   HTN (hypertension) 05/19/2016   Hypertension    MDD (major depressive disorder), recurrent episode 05/28/2016   Neck pain, bilateral 02/15/2016   Non-intractable vomiting with nausea 08/26/2017   Primary osteoarthritis of right knee 01/31/2016   and fingers   PTSD (post-traumatic stress disorder) 06/05/2016   Severe recurrent major depression without psychotic features (HCC) 05/19/2016   Sleep apnea    unable to tolerate CPAP   Substance induced mood disorder (HCC) 05/19/2016   Suicidal ideation 05/19/2016   Type 2 diabetes mellitus with complication, without long-term current use of insulin (HCC) 06/08/2017   Uncontrolled type 2 diabetes mellitus with hyperglycemia, without long-term current use of insulin (HCC) 03/12/2017   Vertigo    Past Surgical History:  Procedure Laterality Date   BACK SURGERY  11/209/18 and 10/08/17   x 2   CHOLECYSTECTOMY     CIRCUMCISION N/A 07/25/2020   Procedure: CIRCUMCISION ADULT;  Surgeon: Penne Knee, MD;  Location: [facility] ORS;  Service: Urology;  Laterality: N/A;   COLONOSCOPY N/A 07/03/2024   Procedure: COLONOSCOPY;  Surgeon: Maryruth Ole DASEN, MD;  Location: [facility] ENDOSCOPY;  Service: Endoscopy;  Laterality: N/A;   COLONOSCOPY WITH PROPOFOL  N/A 01/31/2018   Procedure: COLONOSCOPY WITH PROPOFOL ;  Surgeon: Viktoria Lamar DASEN, MD;  Location: [facility]  ENDOSCOPY;  Service: Endoscopy;  Laterality: N/A;   ESOPHAGOGASTRODUODENOSCOPY     ESOPHAGOGASTRODUODENOSCOPY N/A 07/03/2024   Procedure: EGD (ESOPHAGOGASTRODUODENOSCOPY);  Surgeon: Maryruth Ole DASEN, MD;  Location: [facility] ENDOSCOPY;  Service: Endoscopy;  Laterality: N/A;   ESOPHAGOGASTRODUODENOSCOPY (EGD) WITH PROPOFOL  N/A 01/31/2018   Procedure: ESOPHAGOGASTRODUODENOSCOPY (EGD) WITH PROPOFOL ;  Surgeon: Viktoria Lamar DASEN, MD;  Location: [facility] ENDOSCOPY;  Service: Endoscopy;  Laterality: N/A;   LEG AMPUTATION BELOW KNEE     left   NASAL SEPTOPLASTY W/ TURBINOPLASTY Bilateral 04/17/2020   Procedure: NASAL SEPTOPLASTY WITH INFERIORTURBINATE REDUCTION;  Surgeon: Milissa Hamming, MD;  Location: [facility];  Service: ENT;  Laterality: Bilateral;  Diabetic - oral meds   SHOULDER SURGERY Left    SHOULDER SURGERY Right    Patient Active Problem List   Diagnosis Date Noted   Severe obstructive sleep apnea 06/25/2023   Class 2 obesity with body mass index (BMI) of 39.0 to 39.9 in adult 06/25/2023   Obesity (BMI 35.0-39.9 without comorbidity) 12/14/2019   Metatarsalgia of right foot 10/30/2019   Acquired absence of extremity 10/30/2019   Closed fracture of distal end of radius 05/31/2018   Radiculopathy of lumbosacral region 09/30/2017   Spondylolisthesis at L5-S1 level 09/30/2017   Morbid obesity (HCC) 09/06/2017   Hepatic steatosis 08/26/2017   Non-intractable vomiting with nausea 08/26/2017   Upper abdominal pain 08/26/2017   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   Gastroparesis 06/08/2017   History of noncompliance with medical treatment 06/08/2017   Type 2 diabetes mellitus with obesity 06/08/2017   Uncontrolled type 2 diabetes mellitus with hyperglycemia (  HCC) 03/12/2017   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   Dislocation of right thumb 06/05/2016   Anxiety disorder 06/05/2016   MDD (major depressive disorder), recurrent episode 05/28/2016   Substance induced mood disorder (HCC)  05/19/2016   Severe recurrent major depression without psychotic features (HCC) 05/19/2016   Suicidal ideation 05/19/2016   Essential (primary) hypertension 05/19/2016   GERD (gastroesophageal reflux disease) 05/19/2016   Alcohol use disorder, mild, abuse 05/19/2016   Chronic left shoulder pain 02/15/2016   Neck pain, bilateral 02/15/2016   Primary osteoarthritis of right knee 01/31/2016    PCP: Ernie Yancy Roof, MD   REFERRING PROVIDER: Jordan, Sarah, PA-C   REFERRING DIAG: Pain in left shoulder  THERAPY DIAG:  No diagnosis found.  Rationale for Evaluation and Treatment: Rehabilitation  ONSET DATE: Chronic  SUBJECTIVE:                                                                                                                                                                                      SUBJECTIVE STATEMENT: Patient states he has been    PERTINENT HISTORY: Patient is a 59 y.o. male who presents to outpatient physical therapy with a referral for medical diagnosis of Pain in left shoulder. This patient's chief complaints consist of bilateral shoulder pain, leading to the following functional deficits: weightlifting, general ROM. Relevant past medical history and comorbidities include the following: he has Substance induced mood disorder (HCC); Severe recurrent major depression without psychotic features (HCC); Suicidal ideation; Essential (primary) hypertension; GERD (gastroesophageal reflux disease); Alcohol use disorder, mild, abuse; Amputee; Arthrosis of left acromioclavicular joint; Chronic left shoulder pain; Dislocation of right thumb; Gastroparesis; History of noncompliance with medical treatment; MDD (major depressive disorder), recurrent episode; Neck pain, bilateral; Primary osteoarthritis of right knee; Anxiety disorder; Type 2 diabetes mellitus with obesity; Uncontrolled type 2 diabetes mellitus with hyperglycemia (HCC); Closed fracture of distal end of  radius; Hepatic steatosis; Non-intractable vomiting with nausea; Morbid obesity (HCC); Radiculopathy of lumbosacral region; Spondylolisthesis at L5-S1 level; Upper abdominal pain; Metatarsalgia of right foot; Acquired absence of extremity; Obesity (BMI 35.0-39.9 without comorbidity); Severe obstructive sleep apnea; and Class 2 obesity with body mass index (BMI) of 39.0 to 39.9 in adult on their problem list. he  has a past medical history of Anxiety, Arthrosis of left acromioclavicular joint (11/09/2016), Chronic left shoulder pain (02/15/2016), Depression, Diabetes mellitus without complication (HCC), Dislocation of right thumb (06/05/2016), Diverticulitis, Employs prosthetic leg, Gastroparesis (06/08/2017), GERD (gastroesophageal reflux disease), Headache, HTN (hypertension) (05/19/2016), Hypertension, MDD (major depressive disorder), recurrent episode (05/28/2016), Neck pain, bilateral (02/15/2016), Non-intractable vomiting with nausea (08/26/2017), Primary osteoarthritis of right knee (01/31/2016), PTSD (post-traumatic stress disorder) (  06/05/2016), Severe recurrent major depression without psychotic features (HCC) (05/19/2016), Sleep apnea, Substance induced mood disorder (HCC) (05/19/2016), Suicidal ideation (05/19/2016), Type 2 diabetes mellitus with complication, without long-term current use of insulin (HCC) (06/08/2017), Uncontrolled type 2 diabetes mellitus with hyperglycemia, without long-term current use of insulin (HCC) (03/12/2017), and Vertigo. he  has a past surgical history that includes Leg amputation below knee; Shoulder surgery (Left); Back surgery (11/209/18 and 10/08/17); Esophagogastroduodenoscopy (egd) with propofol  (N/A, 01/31/2018); Colonoscopy with propofol  (N/A, 01/31/2018); Nasal septoplasty w/ turbinoplasty (Bilateral, 04/17/2020); Circumcision (N/A, 07/25/2020); Shoulder surgery (Right); Esophagogastroduodenoscopy; Cholecystectomy; Colonoscopy (N/A, 07/03/2024); and  Esophagogastroduodenoscopy (N/A, 07/03/2024).      PAIN:   Are you having pain? Yes NPRS: Current: 7/10,  Best: 6-7/10, Worst: 10/10. Pain location: directly over Left bicipital tendon, with TTP  Pain description: achey, sharp,  Aggravating factors: laying down, bench pressing, opening door (twisting motion) Relieving factors: heating pad, hot shower     PRECAUTIONS: None  RED FLAGS: Patient denies hx of cancer, stroke, seizures, lung problems, heart problems, unexplained weight loss, unexplained changes in bowel or bladder problems, unexplained stumbling or dropping things, and osteoporosis Had a spinal surgery before for spinal stenosis    WEIGHT BEARING RESTRICTIONS: No  FALLS:  Has patient fallen in last 6 months? Yes. Number of falls 3, all when trying to get in tub  OCCUPATION: On disability   PATIENT GOALS: To go back to the gym with less pain    OBJECTIVE:                                                                                                                            TREATMENT DATE: 11/14/2024  Manual therapy: to reduce pain and tissue tension, improve range of motion, neuromodulation, in order to promote improved ability to complete functional activities.   Scapular clocks in side-lying to improve scapulothoracic motor control and proprioception   Neuromuscular Re-education: a technique or exercise performed with the goal of improving the level of communication between the body and the brain, such as for balance, motor control, muscle activation patterns, coordination, desensitization, quality of muscle contraction, proprioception, and/or kinesthetic sense needed for successful and safe completion of functional activities.     Standing Wall ball to facilitate dynamic scapulohumeral stability, proprioception   3 x spelling alphabet   Supine multidirectional shoulder stabilization with manual resistance applied by PT in multiple directions to improve  dynamic scapulohumeral stability, motor control, and proprioception       PATIENT EDUCATION: Education details: HEP, POC, goals Person educated: Patient Education method: Explanation, Demonstration, and Handouts Education comprehension: verbalized understanding and returned demonstration  HOME EXERCISE PROGRAM: Access Code: [email or web address] URL: [email or web address] Date: 11/14/2024 Prepared by: Maryanne Finder  Exercises - Standing Shoulder Horizontal Abduction with Resistance  - 1-2 x daily - 3-4 x weekly - 3 sets - 10 reps - Shoulder External Rotation with Anchored Resistance  - 1-2 x daily - 3-4 x weekly - 3 sets -  10 reps  ASSESSMENT:  CLINICAL IMPRESSION: Todays session focused on addressing       OBJECTIVE IMPAIRMENTS: decreased activity tolerance, decreased mobility, decreased ROM, decreased strength, impaired flexibility, impaired UE functional use, and pain.   ACTIVITY LIMITATIONS: carrying, lifting, bathing, dressing, and reach over head  PARTICIPATION LIMITATIONS: cleaning and yard work  PERSONAL FACTORS: Age, Behavior pattern, Past/current experiences, and Time since onset of injury/illness/exacerbation are also affecting patient's functional outcome.   REHAB POTENTIAL: Good  CLINICAL DECISION MAKING: Stable/uncomplicated  EVALUATION COMPLEXITY: Low   GOALS: GOALS: Goals reviewed with patient? Yes  SHORT TERM GOALS: Target date: 11/30/2024  Patient will be independent with initial home exercise program for self-management of symptoms. Baseline: Initial HEP provided at IE (11/16/24); Goal status: INITIAL  LONG TERM GOALS: Target date: 02/08/2025  Patient will be independent with a long-term home exercise program for self-management of symptoms.  Baseline: Initial HEP provided at IE (11/16/24); Goal status: INITIAL  2.  Patient will demonstrate improved Upper Extremity Functional Scale (UEFS) to equal or greater than 64/80 to demonstrate  improvement in overall condition and self-reported functional ability.  Baseline: 34/80 (11/16/24); Goal status: INITIAL  3.  Patient will demonstrate improved ROM in L shoulder abduction to 120 degrees to be able to perform lateral raises at gym without limitations.  Baseline: 85 degrees of Left shoulder (11/16/24); Goal status: INITIAL  4.  Patient will report ability to return to gym activities with improvement in pain and motion to achieve his personal goal Baseline: unable to attend gym due to pain with exercise (11/16/24); Goal status: INITIAL  5.  Patient will demonstrate improvement in Patient Specific Functional Scale (PSFS) of equal or greater than 8/10 points to reflect clinically significant improvement in patient's most valued functional activities. Baseline: to be measured at visit 2 as appropriate (11/16/24); Goal status: INITIAL  6.  Patient will report NPRS equal or less than 3/10 during functional activities during the last 2 weeks to improve their abilitly to complete community, work and/or recreational activities with less limitation. Baseline: 7/10 (11/16/24); Goal status: INITIAL  PLAN:  PT FREQUENCY: 1-2x/week  PT DURATION: 8 weeks  PLANNED INTERVENTIONS: [id number]- PT Re-evaluation, 97750- Physical Performance Testing, 97110-Therapeutic exercises, 97530- Therapeutic activity, [id number]- Neuromuscular re-education, 97535- Self Care, [id number]- Manual therapy, and Patient/Family education  PLAN FOR NEXT SESSION: Consider doing PSFS, review HEP and how he feels, consider using UE shoulder strengthening exercises to address impairments. Progress and adjust HEP as needed.   Babak Lucus SPT  Maryanne Finder, PT, DPT Physical Therapist - [facility] 11/16/2024, 5:05 PM  "

## 2024-11-21 ENCOUNTER — Ambulatory Visit: Admitting: Physical Therapy

## 2024-11-27 ENCOUNTER — Ambulatory Visit: Admitting: Physical Therapy

## 2024-11-29 ENCOUNTER — Ambulatory Visit: Admitting: Physical Therapy

## 2024-12-04 ENCOUNTER — Ambulatory Visit: Admitting: Physical Therapy

## 2024-12-06 ENCOUNTER — Ambulatory Visit: Admitting: Physical Therapy

## 2024-12-11 ENCOUNTER — Ambulatory Visit: Admitting: Physical Therapy

## 2024-12-13 ENCOUNTER — Ambulatory Visit: Admitting: Physical Therapy

## 2024-12-18 ENCOUNTER — Ambulatory Visit: Admitting: Physical Therapy

## 2024-12-20 ENCOUNTER — Ambulatory Visit: Admitting: Physical Therapy

## 2024-12-25 ENCOUNTER — Ambulatory Visit: Admitting: Physical Therapy

## 2024-12-27 ENCOUNTER — Ambulatory Visit: Admitting: Physical Therapy

## 2025-01-01 ENCOUNTER — Ambulatory Visit: Admitting: Physical Therapy

## 2025-01-03 ENCOUNTER — Ambulatory Visit: Admitting: Physical Therapy

## 2025-01-08 ENCOUNTER — Ambulatory Visit: Admitting: Physical Therapy

## 2025-01-10 ENCOUNTER — Ambulatory Visit: Admitting: Physical Therapy

## 2025-01-15 ENCOUNTER — Ambulatory Visit: Admitting: Physical Therapy

## 2025-01-17 ENCOUNTER — Ambulatory Visit: Admitting: Physical Therapy
